# Patient Record
Sex: Female | Born: 1960 | Race: Black or African American | Hispanic: No | Marital: Single | Smoking: Current every day smoker
Health system: Southern US, Community
[De-identification: ages and names within clinical notes are randomized; demographics above are authoritative.]

## PROBLEM LIST (undated history)

## (undated) DIAGNOSIS — H539 Unspecified visual disturbance: Secondary | ICD-10-CM

## (undated) DIAGNOSIS — K5900 Constipation, unspecified: Secondary | ICD-10-CM

## (undated) DIAGNOSIS — I10 Essential (primary) hypertension: Secondary | ICD-10-CM

## (undated) DIAGNOSIS — G259 Extrapyramidal and movement disorder, unspecified: Secondary | ICD-10-CM

## (undated) DIAGNOSIS — G35 Multiple sclerosis: Secondary | ICD-10-CM

## (undated) HISTORY — DX: Unspecified visual disturbance: H53.9

## (undated) HISTORY — PX: COLOSTOMY: SHX63

## (undated) HISTORY — PX: HERNIA REPAIR: SHX51

## (undated) HISTORY — DX: Extrapyramidal and movement disorder, unspecified: G25.9

## (undated) HISTORY — PX: COLOSTOMY REVERSAL: SHX5782

## (undated) HISTORY — PX: KNEE SURGERY: SHX244

---

## 2008-04-26 ENCOUNTER — Emergency Department (HOSPITAL_BASED_OUTPATIENT_CLINIC_OR_DEPARTMENT_OTHER): Admission: EM | Admit: 2008-04-26 | Discharge: 2008-04-26 | Payer: Self-pay | Admitting: Emergency Medicine

## 2008-05-02 ENCOUNTER — Emergency Department (HOSPITAL_BASED_OUTPATIENT_CLINIC_OR_DEPARTMENT_OTHER): Admission: EM | Admit: 2008-05-02 | Discharge: 2008-05-02 | Payer: Self-pay | Admitting: Emergency Medicine

## 2010-03-01 ENCOUNTER — Ambulatory Visit: Payer: Self-pay | Admitting: Interventional Radiology

## 2010-03-01 ENCOUNTER — Emergency Department (HOSPITAL_BASED_OUTPATIENT_CLINIC_OR_DEPARTMENT_OTHER)
Admission: EM | Admit: 2010-03-01 | Discharge: 2010-03-01 | Payer: Self-pay | Source: Home / Self Care | Admitting: Emergency Medicine

## 2010-08-03 DIAGNOSIS — M20019 Mallet finger of unspecified finger(s): Secondary | ICD-10-CM | POA: Insufficient documentation

## 2010-08-06 LAB — PREGNANCY, URINE: Preg Test, Ur: NEGATIVE

## 2010-08-06 LAB — URINALYSIS, ROUTINE W REFLEX MICROSCOPIC
Bilirubin Urine: NEGATIVE
Glucose, UA: NEGATIVE mg/dL
Ketones, ur: NEGATIVE mg/dL
Leukocytes, UA: NEGATIVE
Nitrite: NEGATIVE
Protein, ur: NEGATIVE mg/dL
Specific Gravity, Urine: 1.018 (ref 1.005–1.030)
Urobilinogen, UA: 0.2 mg/dL (ref 0.0–1.0)
pH: 5.5 (ref 5.0–8.0)

## 2010-08-06 LAB — URINE MICROSCOPIC-ADD ON

## 2012-04-01 ENCOUNTER — Encounter (HOSPITAL_BASED_OUTPATIENT_CLINIC_OR_DEPARTMENT_OTHER): Payer: Self-pay | Admitting: Emergency Medicine

## 2012-04-01 ENCOUNTER — Emergency Department (HOSPITAL_BASED_OUTPATIENT_CLINIC_OR_DEPARTMENT_OTHER): Payer: PRIVATE HEALTH INSURANCE

## 2012-04-01 ENCOUNTER — Emergency Department (HOSPITAL_BASED_OUTPATIENT_CLINIC_OR_DEPARTMENT_OTHER)
Admission: EM | Admit: 2012-04-01 | Discharge: 2012-04-01 | Disposition: A | Discharge status: Home / Self Care | Payer: PRIVATE HEALTH INSURANCE | Attending: Emergency Medicine | Admitting: Emergency Medicine

## 2012-04-01 DIAGNOSIS — G35 Multiple sclerosis: Secondary | ICD-10-CM | POA: Insufficient documentation

## 2012-04-01 DIAGNOSIS — Z79899 Other long term (current) drug therapy: Secondary | ICD-10-CM | POA: Insufficient documentation

## 2012-04-01 DIAGNOSIS — R1110 Vomiting, unspecified: Secondary | ICD-10-CM | POA: Insufficient documentation

## 2012-04-01 DIAGNOSIS — K56609 Unspecified intestinal obstruction, unspecified as to partial versus complete obstruction: Secondary | ICD-10-CM | POA: Insufficient documentation

## 2012-04-01 DIAGNOSIS — K5900 Constipation, unspecified: Secondary | ICD-10-CM | POA: Insufficient documentation

## 2012-04-01 DIAGNOSIS — I10 Essential (primary) hypertension: Secondary | ICD-10-CM | POA: Insufficient documentation

## 2012-04-01 DIAGNOSIS — K460 Unspecified abdominal hernia with obstruction, without gangrene: Secondary | ICD-10-CM | POA: Insufficient documentation

## 2012-04-01 HISTORY — DX: Multiple sclerosis: G35

## 2012-04-01 HISTORY — DX: Essential (primary) hypertension: I10

## 2012-04-01 HISTORY — DX: Constipation, unspecified: K59.00

## 2012-04-01 LAB — COMPREHENSIVE METABOLIC PANEL
ALT: 13 U/L (ref 0–35)
AST: 16 U/L (ref 0–37)
Alkaline Phosphatase: 71 U/L (ref 39–117)
BUN: 12 mg/dL (ref 6–23)
CO2: 33 mEq/L — ABNORMAL HIGH (ref 19–32)
Calcium: 10.5 mg/dL (ref 8.4–10.5)
Chloride: 97 mEq/L (ref 96–112)
GFR calc Af Amer: 84 mL/min — ABNORMAL LOW (ref >90)
GFR calc non Af Amer: 73 mL/min — ABNORMAL LOW (ref >90)
Glucose, Bld: 104 mg/dL — ABNORMAL HIGH (ref 70–99)
Potassium: 3.5 mEq/L (ref 3.5–5.1)
Sodium: 140 mEq/L (ref 135–145)
Total Bilirubin: 0.4 mg/dL (ref 0.3–1.2)
Total Protein: 8.7 g/dL — ABNORMAL HIGH (ref 6.0–8.3)

## 2012-04-01 LAB — CBC WITH DIFFERENTIAL/PLATELET
Basophils Absolute: 0 10*3/uL (ref 0.0–0.1)
Basophils Relative: 0 % (ref 0–1)
Eosinophils Absolute: 0.2 10*3/uL (ref 0.0–0.7)
Eosinophils Relative: 1 % (ref 0–5)
Hemoglobin: 14.9 g/dL (ref 12.0–15.0)
Lymphocytes Relative: 9 % — ABNORMAL LOW (ref 12–46)
Lymphs Abs: 1.3 10*3/uL (ref 0.7–4.0)
MCH: 29 pg (ref 26.0–34.0)
MCHC: 34.5 g/dL (ref 30.0–36.0)
MCV: 84.2 fL (ref 78.0–100.0)
Neutro Abs: 13.1 10*3/uL — ABNORMAL HIGH (ref 1.7–7.7)
Platelets: 322 10*3/uL (ref 150–400)
RBC: 5.13 MIL/uL — ABNORMAL HIGH (ref 3.87–5.11)
RDW: 12.8 % (ref 11.5–15.5)
WBC: 15.1 10*3/uL — ABNORMAL HIGH (ref 4.0–10.5)

## 2012-04-01 LAB — URINALYSIS, ROUTINE W REFLEX MICROSCOPIC
Bilirubin Urine: NEGATIVE
Glucose, UA: NEGATIVE mg/dL
Hgb urine dipstick: NEGATIVE
Ketones, ur: NEGATIVE mg/dL
Leukocytes, UA: NEGATIVE
Nitrite: NEGATIVE
Protein, ur: NEGATIVE mg/dL
Specific Gravity, Urine: 1.016 (ref 1.005–1.030)
Urobilinogen, UA: 1 mg/dL (ref 0.0–1.0)

## 2012-04-01 LAB — LIPASE, BLOOD: Lipase: 15 U/L (ref 11–59)

## 2012-04-01 MED ORDER — HYDROMORPHONE HCL PF 1 MG/ML IJ SOLN
1.0000 mg | Freq: Once | INTRAMUSCULAR | Status: AC
  Administered 2012-04-01: 1 mg via INTRAVENOUS
  Filled 2012-04-01: qty 1

## 2012-04-01 MED ORDER — SODIUM CHLORIDE 0.9 % IV BOLUS (SEPSIS)
1000.0000 mL | Freq: Once | INTRAVENOUS | Status: AC
  Administered 2012-04-01: 1000 mL via INTRAVENOUS

## 2012-04-01 MED ORDER — METOCLOPRAMIDE HCL 5 MG/ML IJ SOLN
10.0000 mg | Freq: Once | INTRAMUSCULAR | Status: AC
  Administered 2012-04-01: 10 mg via INTRAVENOUS
  Filled 2012-04-01: qty 2

## 2012-04-01 MED ORDER — IOHEXOL 300 MG/ML  SOLN
100.0000 mL | Freq: Once | INTRAMUSCULAR | Status: AC | PRN
  Administered 2012-04-01: 100 mL via INTRAVENOUS

## 2012-04-01 MED ORDER — IOHEXOL 300 MG/ML  SOLN
25.0000 mL | INTRAMUSCULAR | Status: AC
  Administered 2012-04-01: 25 mL via ORAL

## 2012-04-01 MED ORDER — LIDOCAINE VISCOUS 2 % MT SOLN
20.0000 mL | Freq: Once | OROMUCOSAL | Status: AC
  Administered 2012-04-01: 15 mL via OROMUCOSAL
  Filled 2012-04-01: qty 15

## 2012-04-01 MED ORDER — ONDANSETRON HCL 4 MG/2ML IJ SOLN
4.0000 mg | Freq: Once | INTRAMUSCULAR | Status: AC
  Administered 2012-04-01: 4 mg via INTRAVENOUS
  Filled 2012-04-01: qty 2

## 2012-04-01 NOTE — ED Notes (Signed)
Pt having N/V and abdominal pain for a week.  Pt states she is having BM's regularly but has a lot of difficulty with constipation.

## 2012-04-01 NOTE — ED Provider Notes (Signed)
History   This chart was scribed for Tina B. Bernette Mayers, MD by Sofie Rower, ED Scribe. The patient was seen in room MH01/MH01 and the patient's care was started at 5:57PM.     CSN: [id number]  Arrival date & time 04/01/12  1709   First MD Initiated Contact with Patient 04/01/12 1757      Chief Complaint  Patient presents with  . Abdominal Pain  . Emesis    (Consider location/radiation/quality/duration/timing/severity/associated sxs/prior treatment) Patient is a 51 y.o. female presenting with abdominal pain and vomiting. The history is provided by the patient and a relative. No language interpreter was used.  Abdominal Pain The primary symptoms of the illness include abdominal pain and vomiting.  Emesis  Associated symptoms include abdominal pain.    Tina Griffith is a 51 y.o. female , with a hx of constipation and colostomy reversal, who presents to the Emergency Department complaining of sudden, progressively worsening, abdominal pain, located at the lower abdomen , onset one week ago.  Associated symptoms include vomiting (X 1 today). The pt reports her bowels have been moving normally for the past week, however, she has not experienced a bowel movement as of yet today. In addition, the pt informs her baseline value of bowel movements is one every three days. The pt has a hx of multiple sclerosis and hypertension.   The pt denies fever and difficulty urinating.    The pt does not smoke or drink alcohol.   PCP is Dr. Babs Sciara [facility])   Past Medical History  Diagnosis Date  . Constipation   . MS (multiple sclerosis)   . Hypertension     Past Surgical History  Procedure Date  . Colostomy   . Colostomy reversal   . Knee surgery     No family history on file.  History  Substance Use Topics  . Smoking status: Not on file  . Smokeless tobacco: Not on file  . Alcohol Use:     OB History    Grav Para Term Preterm Abortions TAB SAB  Ect Mult Living                  Review of Systems  Gastrointestinal: Positive for vomiting and abdominal pain.  All other systems reviewed and are negative.    Allergies  Review of patient's allergies indicates no known allergies.  Home Medications   Current Outpatient Rx  Name  Route  Sig  Dispense  Refill  . FUROSEMIDE 40 MG PO TABS   Oral   Take 40 mg by mouth daily.         Marland Kitchen GLATIRAMER ACETATE 20 MG/ML [place] KIT   Subcutaneous   Inject 20 mg into the skin daily.         Marland Kitchen HYDROCHLOROTHIAZIDE 25 MG PO TABS   Oral   Take 25 mg by mouth daily.         Marland Kitchen HYDROCODONE-ACETAMINOPHEN 10-325 MG PO TABS   Oral   Take 1 tablet by mouth every 6 (six) hours as needed.         . AMITIZA PO   Oral   Take by mouth.         . MORPHINE SULFATE 15 MG PO TABS   Oral   Take 15 mg by mouth every 4 (four) hours as needed.         Marland Kitchen POLYETHYLENE GLYCOL 3350 PO PACK   Oral   Take 17 g by  mouth daily.         . TRAMADOL HCL 50 MG PO TABS   Oral   Take 50 mg by mouth every 6 (six) hours as needed.           BP 139/67  Pulse 82  Temp 97.8 F (36.6 C) (Oral)  Resp 22  SpO2 100%  Physical Exam  Nursing note and vitals reviewed. Constitutional: She is oriented to person, place, and time. She appears well-developed and well-nourished.  HENT:  Head: Normocephalic and atraumatic.  Eyes: EOM are normal. Pupils are equal, round, and reactive to light.  Neck: Normal range of motion. Neck supple.  Cardiovascular: Normal rate, normal heart sounds and intact distal pulses.   Pulmonary/Chest: Effort normal and breath sounds normal.  Abdominal: Bowel sounds are normal. She exhibits no distension. There is tenderness.       Large midline hernia scar, lower abdominal tenderness detected.   Musculoskeletal: Normal range of motion. She exhibits no edema and no tenderness.  Neurological: She is alert and oriented to person, place, and time. She has normal strength. No  cranial nerve deficit or sensory deficit.  Skin: Skin is warm and dry. No rash noted.  Psychiatric: She has a normal mood and affect.    ED Course  Procedures (including critical care time)  DIAGNOSTIC STUDIES: Oxygen Saturation is 100% on room air, normal by my interpretation.    COORDINATION OF CARE:  6:06 PM- Treatment plan discussed with patient. Pt agrees with treatment.      Results for orders placed during the hospital encounter of 04/01/12  CBC WITH DIFFERENTIAL      Component Value Range   WBC 15.1 (*) 4.0 - 10.5 K/uL   RBC 5.13 (*) 3.87 - 5.11 MIL/uL   Hemoglobin 14.9  12.0 - 15.0 g/dL   HCT 82.9  56.2 - 13.0 %   MCV 84.2  78.0 - 100.0 fL   MCH 29.0  26.0 - 34.0 pg   MCHC 34.5  30.0 - 36.0 g/dL   RDW 86.5  78.4 - 69.6 %   Platelets 322  150 - 400 K/uL   Neutrophils Relative 86 (*) 43 - 77 %   Neutro Abs 13.1 (*) 1.7 - 7.7 K/uL   Lymphocytes Relative 9 (*) 12 - 46 %   Lymphs Abs 1.3  0.7 - 4.0 K/uL   Monocytes Relative 4  3 - 12 %   Monocytes Absolute 0.5  0.1 - 1.0 K/uL   Eosinophils Relative 1  0 - 5 %   Eosinophils Absolute 0.2  0.0 - 0.7 K/uL   Basophils Relative 0  0 - 1 %   Basophils Absolute 0.0  0.0 - 0.1 K/uL  COMPREHENSIVE METABOLIC PANEL      Component Value Range   Sodium 140  135 - 145 mEq/L   Potassium 3.5  3.5 - 5.1 mEq/L   Chloride 97  96 - 112 mEq/L   CO2 33 (*) 19 - 32 mEq/L   Glucose, Bld 104 (*) 70 - 99 mg/dL   BUN 12  6 - 23 mg/dL   Creatinine, Ser 2.95  0.50 - 1.10 mg/dL   Calcium 28.4  8.4 - 13.2 mg/dL   Total Protein 8.7 (*) 6.0 - 8.3 g/dL   Albumin 4.4  3.5 - 5.2 g/dL   AST 16  0 - 37 U/L   ALT 13  0 - 35 U/L   Alkaline Phosphatase 71  39 - 117 U/L  Total Bilirubin 0.4  0.3 - 1.2 mg/dL   GFR calc non Af Amer 73 (*) >90 mL/min   GFR calc Af Amer 84 (*) >90 mL/min  LIPASE, BLOOD      Component Value Range   Lipase 15  11 - 59 U/L  URINALYSIS, ROUTINE W REFLEX MICROSCOPIC      Component Value Range   Color, Urine YELLOW   YELLOW   APPearance CLEAR  CLEAR   Specific Gravity, Urine 1.016  1.005 - 1.030   pH 7.0  5.0 - 8.0   Glucose, UA NEGATIVE  NEGATIVE mg/dL   Hgb urine dipstick NEGATIVE  NEGATIVE   Bilirubin Urine NEGATIVE  NEGATIVE   Ketones, ur NEGATIVE  NEGATIVE mg/dL   Protein, ur NEGATIVE  NEGATIVE mg/dL   Urobilinogen, UA 1.0  0.0 - 1.0 mg/dL   Nitrite NEGATIVE  NEGATIVE   Leukocytes, UA NEGATIVE  NEGATIVE   Ct Abdomen Pelvis W Contrast  04/01/2012  *RADIOLOGY REPORT*  Clinical Data: Small bowel obstruction, evaluate for incarcerated hernia, history of colostomy with reversal 3 years ago  CT ABDOMEN AND PELVIS WITH CONTRAST  Technique:  Multidetector CT imaging of the abdomen and pelvis was performed following the standard protocol during bolus administration of intravenous contrast.  Contrast: 1 OMNIPAQUE IOHEXOL 300 MG/ML  SOLN, OMNIPAQUE IOHEXOL 300 MG/ML  SOLN  Comparison: None.  Findings:  There are two adjacent midline anterior abdominal wall incisional hernias.  The more cranially located hernia neck measures approximately 4.4 cm in greatest coronal dimension (sagittal image 64, series six) and contains a short segment of nondilated transverse colon.  The neck of the larger more caudally located hernia measures approximately 8.9 cm in greatest sagittal dimension (image 66, series 6) and contains multiple loops of dilated small bowel and results in obstruction of the exiting loop of small bowel (axial image 66, series 2).  Feculent material is seen within the adjacent small bowel contained within the hernia sac (image 71, series 2) compatible with stasis.   The downstream small bowel is decompressed.  There is a small amount of free fluid within the hernia sac (image 71, series 2). There is stranding and mesenteric vascular congestion about the caudal aspect of the neck of the hernia.  No drainable fluid collection.   No pneumoperitoneum, pneumatosis or portal venous gas.  A staple line is seen within  the distal sigmoid colon (axial images 51-56).  Colonic diverticulosis without evidence of diverticulitis.  Normal hepatic contour.  Sub centimeter hypoattenuating lesion within the left lobe of the liver (image 10, series 2) is too small to accurately characterize.  Normal appearance of the gallbladder. No intra or extrahepatic biliary ductal dilatation.  No ascites.  Symmetric enhancement and excretion of the bilateral kidneys.  No discrete renal lesions.  No renal stones.  No urinary obstruction or perinephric stranding.  Normal appearance of the bilateral adrenal glands, pancreas and spleen.  Incidental note is made of a small splenule.  Scattered atherosclerotic calcifications of a normal caliber abdominal aorta.  The major branch vessels of the abdominal aorta are patent.  No retroperitoneal, mesenteric, pelvic or inguinal lymphadenopathy.  Enlarged myomatous uterus one of which appears degenerating and peripherally calcified (image 62, series 2).  There is a minimal amount of fluid within the endometrial canal, presumably physiologic.  No discrete adnexal lesion.  No free fluid in the pelvis.  Limited visualization of the lower thorax demonstrates dependent subpleural ground-glass atelectasis. Note is made of a calcified  granuloma within the right lower lung (image 6).  No focal airspace opacities or pleural effusions.  Normal heart size.  No pericardial effusion.  No acute or aggressive osseous abnormalities.  IMPRESSION:  1.  Two wide neck midline lower anterior abdominal wall incisional hernias.  The more caudal hernia results in a high-grade small bowel obstruction at the level of the exiting loop of small bowel. There is a minimal amount of fluid within the caudal hernia sac but no evidence of pneumoperitoneum or drainable fluid collection.  2.  The more cranially located wide neck incisional hernia contains a short segment of nondilated loop of transverse colon.  3.  Colonic diverticulosis without  evidence of diverticulitis.  4.  Enlarged myomatous uterus.   Original Report Authenticated By: Tacey Ruiz, MD       1. Small bowel obstruction   2. Incarcerated hernia       MDM  CT as above shows SBO from incarcerated bowel. I attempted again to reduce hernia without success. Discussed with General Surgery at [facility] who will accept her in transfer. NGT placed for decompression. Pt understands plan.       I personally performed the services described in this documentation, which was scribed in my presence. The recorded information has been reviewed and is accurate.     Tina B. Bernette Mayers, MD 04/01/12 2336

## 2012-04-01 NOTE — ED Notes (Signed)
Patient transported to CT 

## 2012-04-01 NOTE — ED Notes (Signed)
[facility] called to transport to [facility] ED, RN aware.

## 2012-04-01 NOTE — ED Notes (Signed)
NG attempted to right nare-would not pass-easily passed thru left nare-placement verified with air auscultation and return of gastric contents-placed on low intermittent suction per EDP orders

## 2012-04-01 NOTE — ED Notes (Signed)
[facility] here for transport-NG removed from suction and clamped for transport

## 2012-06-26 DIAGNOSIS — N939 Abnormal uterine and vaginal bleeding, unspecified: Secondary | ICD-10-CM | POA: Insufficient documentation

## 2012-10-28 DIAGNOSIS — K5730 Diverticulosis of large intestine without perforation or abscess without bleeding: Secondary | ICD-10-CM | POA: Insufficient documentation

## 2012-10-28 DIAGNOSIS — G35 Multiple sclerosis: Secondary | ICD-10-CM | POA: Insufficient documentation

## 2012-10-28 DIAGNOSIS — Z79899 Other long term (current) drug therapy: Secondary | ICD-10-CM | POA: Insufficient documentation

## 2012-10-28 DIAGNOSIS — N951 Menopausal and female climacteric states: Secondary | ICD-10-CM | POA: Insufficient documentation

## 2012-10-28 DIAGNOSIS — S83289A Other tear of lateral meniscus, current injury, unspecified knee, initial encounter: Secondary | ICD-10-CM | POA: Insufficient documentation

## 2012-10-28 DIAGNOSIS — M25569 Pain in unspecified knee: Secondary | ICD-10-CM | POA: Insufficient documentation

## 2012-10-28 DIAGNOSIS — B351 Tinea unguium: Secondary | ICD-10-CM | POA: Insufficient documentation

## 2012-11-13 ENCOUNTER — Emergency Department (HOSPITAL_BASED_OUTPATIENT_CLINIC_OR_DEPARTMENT_OTHER)
Admission: EM | Admit: 2012-11-13 | Discharge: 2012-11-13 | Disposition: A | Discharge status: Home / Self Care | Payer: Medicare Other | Attending: Emergency Medicine | Admitting: Emergency Medicine

## 2012-11-13 ENCOUNTER — Encounter (HOSPITAL_BASED_OUTPATIENT_CLINIC_OR_DEPARTMENT_OTHER): Payer: Self-pay

## 2012-11-13 ENCOUNTER — Emergency Department (HOSPITAL_BASED_OUTPATIENT_CLINIC_OR_DEPARTMENT_OTHER): Payer: Medicare Other

## 2012-11-13 DIAGNOSIS — Z8719 Personal history of other diseases of the digestive system: Secondary | ICD-10-CM | POA: Insufficient documentation

## 2012-11-13 DIAGNOSIS — Z79899 Other long term (current) drug therapy: Secondary | ICD-10-CM | POA: Insufficient documentation

## 2012-11-13 DIAGNOSIS — L02419 Cutaneous abscess of limb, unspecified: Secondary | ICD-10-CM | POA: Insufficient documentation

## 2012-11-13 DIAGNOSIS — Z8669 Personal history of other diseases of the nervous system and sense organs: Secondary | ICD-10-CM | POA: Insufficient documentation

## 2012-11-13 DIAGNOSIS — I10 Essential (primary) hypertension: Secondary | ICD-10-CM | POA: Insufficient documentation

## 2012-11-13 DIAGNOSIS — F17200 Nicotine dependence, unspecified, uncomplicated: Secondary | ICD-10-CM | POA: Insufficient documentation

## 2012-11-13 DIAGNOSIS — L03119 Cellulitis of unspecified part of limb: Secondary | ICD-10-CM | POA: Insufficient documentation

## 2012-11-13 DIAGNOSIS — L03116 Cellulitis of left lower limb: Secondary | ICD-10-CM

## 2012-11-13 LAB — BASIC METABOLIC PANEL
CO2: 34 mEq/L — ABNORMAL HIGH (ref 19–32)
Calcium: 10.1 mg/dL (ref 8.4–10.5)
Creatinine, Ser: 1.1 mg/dL (ref 0.50–1.10)
GFR calc non Af Amer: 57 mL/min — ABNORMAL LOW (ref >90)
Glucose, Bld: 84 mg/dL (ref 70–99)

## 2012-11-13 LAB — CBC WITH DIFFERENTIAL/PLATELET
Basophils Absolute: 0 10*3/uL (ref 0.0–0.1)
Eosinophils Absolute: 0.3 10*3/uL (ref 0.0–0.7)
Eosinophils Relative: 3 % (ref 0–5)
HCT: 36.9 % (ref 36.0–46.0)
Lymphocytes Relative: 17 % (ref 12–46)
Lymphs Abs: 1.6 10*3/uL (ref 0.7–4.0)
MCH: 31.5 pg (ref 26.0–34.0)
MCV: 88.7 fL (ref 78.0–100.0)
Monocytes Absolute: 0.8 10*3/uL (ref 0.1–1.0)
Platelets: 284 10*3/uL (ref 150–400)
RDW: 12.4 % (ref 11.5–15.5)
WBC: 9.2 10*3/uL (ref 4.0–10.5)

## 2012-11-13 MED ORDER — OXYCODONE-ACETAMINOPHEN 5-325 MG PO TABS
2.0000 | ORAL_TABLET | Freq: Once | ORAL | Status: AC
  Administered 2012-11-13: 2 via ORAL
  Filled 2012-11-13 (×2): qty 2

## 2012-11-13 MED ORDER — CEPHALEXIN 500 MG PO CAPS: 500.0000 mg | ORAL_CAPSULE | Freq: Four times a day (QID) | ORAL | Status: DC

## 2012-11-13 MED ORDER — DEXTROSE 5 % IV SOLN
1.0000 g | Freq: Once | INTRAVENOUS | Status: AC
  Administered 2012-11-13: 1 g via INTRAVENOUS
  Filled 2012-11-13: qty 10

## 2012-11-13 NOTE — ED Notes (Signed)
Family at bedside. 

## 2012-11-13 NOTE — ED Notes (Signed)
Pt reports left leg and foot swelling that started Friday.  She was seen by PMD for a check-up.  Foot and lower leg is warm to touch, swollen and painful.

## 2012-11-13 NOTE — ED Provider Notes (Signed)
History     CSN: [id number]  Arrival date & time 11/13/12  1245   First MD Initiated Contact with Patient 11/13/12 1256      Chief Complaint  Patient presents with  . Leg Swelling    (Consider location/radiation/quality/duration/timing/severity/associated sxs/prior treatment) HPI  Patient with swelling left foot for 2 days.  Seen by pmd 2 days pte and states it was not swollen then but was red. She states her doctor was going to check the blood flow op but after she called today and told her doctor about the swelling she was told to come to the ed. She has noted pain in the left calf that is sharp and aching.  There is some redness but she has not noted rapid increase and denies injury.  She denies chest pain, dyspnea, fever, immobilization, history of dvt or pe.    Past Medical History  Diagnosis Date  . Constipation   . MS (multiple sclerosis)   . Hypertension     Past Surgical History  Procedure Laterality Date  . Colostomy    . Colostomy reversal    . Knee surgery    . Hernia repair      No family history on file.  History  Substance Use Topics  . Smoking status: Current Every Day Smoker -- 1.00 packs/day    Types: Cigarettes  . Smokeless tobacco: Not on file  . Alcohol Use: No    OB History   Grav Para Term Preterm Abortions TAB SAB Ect Mult Living                  Review of Systems  All other systems reviewed and are negative.    Allergies  Review of patient's allergies indicates no known allergies.  Home Medications   Current Outpatient Rx  Name  Route  Sig  Dispense  Refill  . furosemide (LASIX) 40 MG tablet   Oral   Take 40 mg by mouth daily.         Marland Kitchen glatiramer (COPAXONE) 20 MG/ML injection   Subcutaneous   Inject 20 mg into the skin daily.         . hydrochlorothiazide (HYDRODIURIL) 25 MG tablet   Oral   Take 25 mg by mouth daily.         Marland Kitchen HYDROcodone-acetaminophen (NORCO) 10-325 MG per tablet   Oral   Take 1 tablet by  mouth every 6 (six) hours as needed.         . Lubiprostone (AMITIZA PO)   Oral   Take by mouth.         . morphine (MSIR) 15 MG tablet   Oral   Take 15 mg by mouth every 4 (four) hours as needed.         . polyethylene glycol (MIRALAX / GLYCOLAX) packet   Oral   Take 17 g by mouth daily.         . traMADol (ULTRAM) 50 MG tablet   Oral   Take 50 mg by mouth every 6 (six) hours as needed.           BP 133/72  Pulse 83  Temp(Src) 97.9 F (36.6 C) (Oral)  Resp 18  Ht 5\' 6"  (1.676 m)  Wt 174 lb (78.926 kg)  BMI 28.1 kg/m2  SpO2 100%  Physical Exam  Nursing note and vitals reviewed. Constitutional: She is oriented to person, place, and time. She appears well-developed and well-nourished.  HENT:  Head: Normocephalic  and atraumatic.  Right Ear: External ear normal.  Left Ear: External ear normal.  Nose: Nose normal.  Mouth/Throat: Oropharynx is clear and moist.  Eyes: Conjunctivae and EOM are normal. Pupils are equal, round, and reactive to light.  Neck: Normal range of motion. Neck supple.  Cardiovascular: Normal rate, regular rhythm and normal heart sounds.   Pulmonary/Chest: Effort normal and breath sounds normal.  Abdominal: Soft. Bowel sounds are normal.  Musculoskeletal: Normal range of motion. She exhibits edema.  See skin, dp intact bilaterally equally  Neurological: She is alert and oriented to person, place, and time. She has normal reflexes.  Skin:  Left calf ttp, pedal edema left foot greater than right foot with edema anterior tibia left, with erythema and some mild warmth  Psychiatric: She has a normal mood and affect. Her behavior is normal. Judgment and thought content normal.    ED Course  Procedures (including critical care time)  Labs Reviewed  CBC WITH DIFFERENTIAL  BASIC METABOLIC PANEL   US Venous Img Lower Unilateral Left  11/13/2012   *RADIOLOGY REPORT*  Clinical Data: Left leg pain and swelling  LEFT LOWER EXTREMITY VENOUS DUPLEX  ULTRASOUND  Technique:  Gray-scale sonography with graded compression, as well as color Doppler and duplex ultrasound were performed to evaluate the deep venous system of the lower extremity from the level of the common femoral vein through the popliteal and proximal calf veins. Spectral Doppler was utilized to evaluate flow at rest and with distal augmentation maneuvers.  Comparison:  None.  Findings:  Normal compressibility of the common femoral, superficial femoral, and popliteal veins is demonstrated, as well as the visualized proximal calf veins.  No filling defects to suggest DVT on grayscale or color Doppler imaging.  Doppler waveforms show normal direction of venous flow, normal respiratory phasicity and response to augmentation.  IMPRESSION: No evidence of lower extremity deep vein thrombosis.   Original Report Authenticated By: Judie Petit. Miles Costain, M.D.     No diagnosis found.    MDM     DDX- presentation concerning for dvt but no evidence of clot on [place], patient with probable cellulitis but not diabetic, wbc normal.  IV rocephin given here.  Good pulses and no evidence of ischemia.      Hilario Quarry, MD 11/14/12 [id number]

## 2012-11-29 DIAGNOSIS — R609 Edema, unspecified: Secondary | ICD-10-CM | POA: Insufficient documentation

## 2013-08-22 DIAGNOSIS — I10 Essential (primary) hypertension: Secondary | ICD-10-CM | POA: Insufficient documentation

## 2013-08-22 DIAGNOSIS — E039 Hypothyroidism, unspecified: Secondary | ICD-10-CM | POA: Insufficient documentation

## 2013-08-22 DIAGNOSIS — E7800 Pure hypercholesterolemia, unspecified: Secondary | ICD-10-CM | POA: Insufficient documentation

## 2013-08-22 DIAGNOSIS — F17200 Nicotine dependence, unspecified, uncomplicated: Secondary | ICD-10-CM | POA: Insufficient documentation

## 2013-08-22 DIAGNOSIS — Z720 Tobacco use: Secondary | ICD-10-CM | POA: Insufficient documentation

## 2013-08-22 DIAGNOSIS — D509 Iron deficiency anemia, unspecified: Secondary | ICD-10-CM | POA: Insufficient documentation

## 2013-08-22 DIAGNOSIS — J309 Allergic rhinitis, unspecified: Secondary | ICD-10-CM | POA: Insufficient documentation

## 2013-08-22 DIAGNOSIS — R32 Unspecified urinary incontinence: Secondary | ICD-10-CM | POA: Insufficient documentation

## 2013-12-28 DIAGNOSIS — R946 Abnormal results of thyroid function studies: Secondary | ICD-10-CM | POA: Insufficient documentation

## 2014-02-28 DIAGNOSIS — G8110 Spastic hemiplegia affecting unspecified side: Secondary | ICD-10-CM | POA: Insufficient documentation

## 2014-02-28 DIAGNOSIS — N950 Postmenopausal bleeding: Secondary | ICD-10-CM | POA: Insufficient documentation

## 2014-02-28 DIAGNOSIS — M2240 Chondromalacia patellae, unspecified knee: Secondary | ICD-10-CM | POA: Insufficient documentation

## 2014-02-28 DIAGNOSIS — R260 Ataxic gait: Secondary | ICD-10-CM | POA: Insufficient documentation

## 2014-02-28 DIAGNOSIS — H469 Unspecified optic neuritis: Secondary | ICD-10-CM | POA: Insufficient documentation

## 2014-02-28 DIAGNOSIS — M79609 Pain in unspecified limb: Secondary | ICD-10-CM | POA: Insufficient documentation

## 2014-02-28 DIAGNOSIS — L8990 Pressure ulcer of unspecified site, unspecified stage: Secondary | ICD-10-CM | POA: Insufficient documentation

## 2014-02-28 DIAGNOSIS — M24573 Contracture, unspecified ankle: Secondary | ICD-10-CM | POA: Insufficient documentation

## 2014-02-28 DIAGNOSIS — R200 Anesthesia of skin: Secondary | ICD-10-CM | POA: Insufficient documentation

## 2014-02-28 DIAGNOSIS — M179 Osteoarthritis of knee, unspecified: Secondary | ICD-10-CM | POA: Insufficient documentation

## 2014-02-28 DIAGNOSIS — M24576 Contracture, unspecified foot: Secondary | ICD-10-CM

## 2014-02-28 DIAGNOSIS — K430 Incisional hernia with obstruction, without gangrene: Secondary | ICD-10-CM | POA: Insufficient documentation

## 2014-02-28 DIAGNOSIS — D219 Benign neoplasm of connective and other soft tissue, unspecified: Secondary | ICD-10-CM | POA: Insufficient documentation

## 2014-02-28 DIAGNOSIS — R531 Weakness: Secondary | ICD-10-CM | POA: Insufficient documentation

## 2014-02-28 DIAGNOSIS — M1710 Unilateral primary osteoarthritis, unspecified knee: Secondary | ICD-10-CM | POA: Insufficient documentation

## 2014-02-28 DIAGNOSIS — L84 Corns and callosities: Secondary | ICD-10-CM | POA: Insufficient documentation

## 2014-02-28 DIAGNOSIS — D573 Sickle-cell trait: Secondary | ICD-10-CM | POA: Insufficient documentation

## 2014-02-28 DIAGNOSIS — M6281 Muscle weakness (generalized): Secondary | ICD-10-CM | POA: Insufficient documentation

## 2014-02-28 DIAGNOSIS — L97909 Non-pressure chronic ulcer of unspecified part of unspecified lower leg with unspecified severity: Secondary | ICD-10-CM | POA: Insufficient documentation

## 2014-02-28 DIAGNOSIS — N938 Other specified abnormal uterine and vaginal bleeding: Secondary | ICD-10-CM | POA: Insufficient documentation

## 2014-02-28 DIAGNOSIS — M25569 Pain in unspecified knee: Secondary | ICD-10-CM | POA: Insufficient documentation

## 2014-04-22 DIAGNOSIS — E876 Hypokalemia: Secondary | ICD-10-CM | POA: Insufficient documentation

## 2014-05-28 ENCOUNTER — Telehealth: Payer: Self-pay | Admitting: *Deleted

## 2014-05-28 NOTE — Telephone Encounter (Signed)
Patient calling in to get medication refilled, patient was informed that Dr [name] is not in the office yet, patient spoke with Dr Garth Bigness nurse Faith.

## 2014-05-31 ENCOUNTER — Telehealth: Payer: Self-pay | Admitting: Neurology

## 2014-05-31 NOTE — Telephone Encounter (Signed)
I called the patient. She got a Rx for hydrocodone, it was taken to the pharmacy and the pharmacy needed authorization to fill it. I called the pharmacy, and the medication was written by Dr. Domenic Schwab. The pharmacy has contacted their office.

## 2014-07-25 ENCOUNTER — Encounter: Payer: Self-pay | Admitting: Neurology

## 2014-07-25 ENCOUNTER — Ambulatory Visit (INDEPENDENT_AMBULATORY_CARE_PROVIDER_SITE_OTHER): Payer: Medicare Other | Admitting: Neurology

## 2014-07-25 VITALS — BP 118/70 | HR 90 | Resp 14 | Ht 64.0 in | Wt 148.6 lb

## 2014-07-25 DIAGNOSIS — R350 Frequency of micturition: Secondary | ICD-10-CM | POA: Diagnosis not present

## 2014-07-25 DIAGNOSIS — K5909 Other constipation: Secondary | ICD-10-CM

## 2014-07-25 DIAGNOSIS — G35 Multiple sclerosis: Secondary | ICD-10-CM

## 2014-07-25 DIAGNOSIS — K5900 Constipation, unspecified: Secondary | ICD-10-CM | POA: Insufficient documentation

## 2014-07-25 DIAGNOSIS — M79605 Pain in left leg: Secondary | ICD-10-CM | POA: Diagnosis not present

## 2014-07-25 DIAGNOSIS — R261 Paralytic gait: Secondary | ICD-10-CM | POA: Insufficient documentation

## 2014-07-25 MED ORDER — MORPHINE SULFATE 30 MG PO TABS: 30.0000 mg | ORAL_TABLET | Freq: Three times a day (TID) | ORAL | Status: DC

## 2014-07-25 MED ORDER — POLYETHYLENE GLYCOL 3350 17 G PO PACK: 17.0000 g | PACK | Freq: Every day | ORAL | Status: DC

## 2014-07-25 MED ORDER — HYDROCODONE-ACETAMINOPHEN 10-325 MG PO TABS: 1.0000 | ORAL_TABLET | ORAL | Status: DC | PRN

## 2014-07-25 NOTE — Progress Notes (Signed)
[facility]  PATIENT: Tina Griffith DOB: 06-21-1960  REFERRING DOCTOR OR PCP:  None currently SOURCE: patient  _________________________________   HISTORICAL  CHIEF COMPLAINT:  Chief Complaint  Patient presents with  . Multiple Sclerosis    Sts. she tolerates Copaxone well.  Denies new or worsening sx.  She needs r/f of Hydrocodone and Morphine./fim    HISTORY OF PRESENT ILLNESS:  Tina Griffith is a 54 year old woman with multiple sclerosis diagnosed in July 2005. She presented with left leg spasticity and weakness and an MRI was consistent with multiple sclerosis. Initially, she was placed on Betaseron but had some exacerbations with worsening leg function. She then switched to Copaxone. She has been tolerating Copaxone pretty well and recently switched to the 40 mg every other day dose. She denies any recent exacerbation.    MRI of the brain 05/09/2013 showed prominent white matter changes in the right cerebellum and the white matter of both hemispheres in a pattern and configuration consistent with multiple sclerosis. There were no enhancing lesions. When the study was compared to a prior MRI dated 01/11/2011, there was no new or progressive findings.  Gait/strength/sensation/pain: Her main problem has always been the left leg where she has a lot of spasticity and weakness. Because of that her gait is spastic and wide. She uses a walker. Easily, she has had spasticity in the right arm. She notes decreased function of the right hand. Recently she had her tizanidine increase to 8 mg 3 times a day. She noted only a mild benefit with the higher dose. She is on morphine IR 30 mg 3 times a day and hydrocodone 10/325 up to 6 times a day. With these medicines she notes benefit with less pain. She tolerates the medicine fairly well with the exception constipation. She takes Linzess plus MiraLAX with a lot of benefit for the constipation.  Bladder/bowel: She notes that she  has more urinary frequency and recently the odor has been different.   She has had UTI's in past and is concerned about another one.  . She denies any significant hesitancy and feels that she usually empties her bladder well. She has had some urinary tract infections. She has had constipation that is likely more due to the pain medicines than to the MS. The constipation improved with Linzess plus MiraLAX.  Vision: She denies any MS related vision problems. She wears glasses with benefit.  Fatigue/sleep: She feels that her energy level is pretty good most days. She wakes up very energized and has no difficulty doing activities that she sets out to do. She sleeps well.  Mood/cognition: She denies depression. Her anxiety is doing much better. She no longer needs medicines frequently. She denies any significant cognitive problems and notes no problems with short-term memory. She feels her thinking skills are unchanged where there were a few years ago.   REVIEW OF SYSTEMS: Constitutional: No fevers, chills, sweats, or change in appetite Eyes: No visual changes, double vision, eye pain Ear, nose and throat: No hearing loss, ear pain, nasal congestion, sore throat Cardiovascular: No chest pain, palpitations Respiratory: No shortness of breath at rest or with exertion.   No wheezes GastrointestinaI: Notes constipation.   No pain Genitourinary: Notes urinary frequency. Musculoskeletal: No neck pain, some back pain, lots of left > right leg pain Integumentary: No rash, pruritus, skin lesions Neurological: as above Psychiatric: No depression at this time.  No anxiety Endocrine: No palpitations, diaphoresis, change in appetite, change in weigh or increased  thirst Hematologic/Lymphatic: No anemia, purpura, petechiae. Allergic/Immunologic: No itchy/runny eyes, nasal congestion, recent allergic reactions, rashes  ALLERGIES: No Known Allergies  HOME MEDICATIONS:  Current outpatient prescriptions:    .  furosemide (LASIX) 40 MG tablet, Take 40 mg by mouth daily., Disp: , Rfl:  .  glatiramer (COPAXONE) 20 MG/ML injection, Inject 20 mg into the skin daily., Disp: , Rfl:  .  hydrochlorothiazide (HYDRODIURIL) 25 MG tablet, Take 25 mg by mouth daily., Disp: , Rfl:  .  HYDROcodone-acetaminophen (NORCO) 10-325 MG per tablet, Take 1 tablet by mouth every 6 (six) hours as needed., Disp: , Rfl:  .  Lubiprostone (AMITIZA PO), Take by mouth., Disp: , Rfl:  .  morphine (MSIR) 15 MG tablet, Take 15 mg by mouth every 4 (four) hours as needed., Disp: , Rfl:  .  polyethylene glycol (MIRALAX / GLYCOLAX) packet, Take 17 g by mouth daily., Disp: , Rfl:  .  traMADol (ULTRAM) 50 MG tablet, Take 50 mg by mouth every 6 (six) hours as needed., Disp: , Rfl:  .  cephALEXin (KEFLEX) 500 MG capsule, Take 1 capsule (500 mg total) by mouth 4 (four) times daily. (Patient not taking: Reported on 07/25/2014), Disp: 40 capsule, Rfl: 0  PAST MEDICAL HISTORY: Past Medical History  Diagnosis Date  . Constipation   . MS (multiple sclerosis)   . Hypertension   . Movement disorder   . Vision abnormalities     PAST SURGICAL HISTORY: Past Surgical History  Procedure Laterality Date  . Colostomy    . Colostomy reversal    . Knee surgery    . Hernia repair      FAMILY HISTORY: Family History  Problem Relation Age of Onset  . Lung cancer Mother   . Stroke Mother   . Diabetes Father     SOCIAL HISTORY:  History   Social History  . Marital Status: Single    Spouse Name: N/A  . Number of Children: N/A  . Years of Education: N/A   Occupational History  . Not on file.   Social History Main Topics  . Smoking status: Current Every Day Smoker -- 1.00 packs/day    Types: Cigarettes  . Smokeless tobacco: Not on file  . Alcohol Use: No  . Drug Use: No  . Sexual Activity: Not on file   Other Topics Concern  . Not on file   Social History Narrative     PHYSICAL EXAM  Filed Vitals:   07/25/14 0951  BP:  118/70  Pulse: 90  Resp: 14  Height: 5\' 4"  (1.626 m)  Weight: 148 lb 9.6 oz (67.405 kg)    Body mass index is 25.49 kg/(m^2).   General: The patient is well-developed and well-nourished and in no acute distress  Eyes:  Funduscopic exam shows normal optic discs and retinal vessels.   Bilat arcus senilis  Neck: The neck is supple, no carotid bruits are noted.  The neck is nontender.  Cardiovascular: The heart has a regular rate and rhythm with a normal S1 and S2. There were no murmurs, gallops or rubs. Lungs are clear to auscultation.  Skin: Extremities are without significant edema.  Musculoskeletal:  Back is nontender  Neurologic Exam  Mental status: The patient is alert and oriented x 3 at the time of the examination. The patient has apparent normal recent and remote memory, with an apparently normal attention span and concentration ability.   Speech is normal.  Cranial nerves: Extraocular movements are full. Pupils are equal,  round, and reactive to light and accomodation.  Visual fields are full.  Facial symmetry is present. There is good facial sensation to soft touch bilaterally.Facial strength is normal.  Trapezius and sternocleidomastoid strength is normal. No dysarthria is noted.  The tongue is midline, and the patient has symmetric elevation of the soft palate. No obvious hearing deficits are noted.  Motor:  Muscle bulk is normal.   Tone is increased in legs, left worse than right. Strength is  4+/5 in right arm and leg, 4-/5 in left leg and 5/5 in left arm   Sensory: Sensory testing is intact to pinprick, soft touch and vibration sensation in all 4 extremities.  Coordination: She has an intention tremor in arms and legs, worse in right arm and both legs.  Cerebellar testing reveals reduced right finger-nose-finger and reduced right and very poor left heel-to-shin .   Right RAM in arm is slowed  Gait and station: Station is normal.   Gait is spastic and with left foot drop.  She cannot tandem walk. Romberg is negative.   Reflexes: Deep tendon reflexes are increased in legs.   Plantar responses are flexor.    DIAGNOSTIC DATA (LABS, IMAGING, TESTING) - I reviewed patient records, labs, notes, testing and imaging myself where available.  Records from [facility] neurology were reviewed   ASSESSMENT AND PLAN  Multiple sclerosis - Plan: Urinalysis with Reflex Microscopic, Culture, Urine  Spastic gait  Leg pain, left  Urinary frequency - Plan: Urinalysis with Reflex Microscopic, Culture, Urine  Other constipation   In summary, Tina Griffith is a 54 year old woman with multiple sclerosis who has had had difficulty with her gait due to left greater than right spasticity and pain.  She is doing fairly well on her current medical regimen and I will renew her opiate prescriptions. She might have a urinary tract infection and I'll check a UA and culture and start antibiotic base of the results. She is advised to continue her bowel regimen for her constipation as it has been more successful lately.  She will return to see me in 4 months or sooner if she has new or worsening neurologic symptoms.  50 minute face-to-face evaluation with greater than 50% of the visit counseling and coordinating care about her multiple sclerosis and related symptoms.   Richard A. [name], MD, PhD 06/27/8248, 03:70 AM Certified in Neurology, Clinical Neurophysiology, Sleep Medicine, Pain Medicine and Neuroimaging  [facility] [address] [place], [place] [address] [phone]

## 2014-07-26 LAB — URINALYSIS, ROUTINE W REFLEX MICROSCOPIC
Bilirubin, UA: NEGATIVE
Glucose, UA: NEGATIVE
Ketones, UA: NEGATIVE
Leukocytes, UA: NEGATIVE
Nitrite, UA: NEGATIVE
PROTEIN UA: NEGATIVE
RBC, UA: NEGATIVE
Specific Gravity, UA: 1.018 (ref 1.005–1.030)
Urobilinogen, Ur: 0.2 mg/dL (ref 0.2–1.0)
pH, UA: 5.5 (ref 5.0–7.5)

## 2014-07-26 LAB — URINE CULTURE: ORGANISM ID, BACTERIA: NO GROWTH

## 2014-07-29 ENCOUNTER — Telehealth: Payer: Self-pay | Admitting: *Deleted

## 2014-07-29 NOTE — Telephone Encounter (Signed)
-----   Message from [place]. [name], MD sent at 07/26/2014 12:57 PM EST ----- UA is ok does not need any antibiotics

## 2014-07-29 NOTE — Telephone Encounter (Signed)
Spoke with [place] and per RAS, advised u/a did not show uti--she does not need antibiotics. Marchella verbalized understanding of same/fim

## 2014-08-16 ENCOUNTER — Other Ambulatory Visit: Payer: Self-pay | Admitting: Neurology

## 2014-08-16 ENCOUNTER — Telehealth: Payer: Self-pay | Admitting: Neurology

## 2014-08-16 NOTE — Telephone Encounter (Signed)
Pt is calling requesting a written Rx for traMADol (ULTRAM) 50 MG tablet. Please call when ready for pick up.

## 2014-08-16 NOTE — Telephone Encounter (Signed)
Patient was seen on 03/03.  At that time Morphine and Hydrocodone were prescribed.  I called the pharmacy to inquire about Tramadol Rx.  Was told it was last filled from Dr [name] in November (directions two bid prn), and since that time patient has gotten a refill from Dr Sable Feil and Dr Corene Cornea.  Dr [name] is out of the office.  Forwarding request to [facility] for review.  Please advise.  Thank you.

## 2014-08-16 NOTE — Telephone Encounter (Signed)
Since patient has gotten Tramadol refills from several other doctors since seeing Dr. [name], I would prefer Dr. [name] to approve any more refills from this office if he feels this is clinically warranted. Thank you.

## 2014-08-16 NOTE — Telephone Encounter (Signed)
Spoke with [place] and explained RAS is ooo today--Dr. Lavell Anchors will not rx. Tramadol since she got it from other Dr.'s--(even tho they are [facility] docs, rx'ing it after RAS left that office).  Will check with RAS about this Monday./fim

## 2014-08-19 ENCOUNTER — Encounter: Payer: Self-pay | Admitting: *Deleted

## 2014-08-19 ENCOUNTER — Telehealth: Payer: Self-pay | Admitting: Neurology

## 2014-08-19 ENCOUNTER — Other Ambulatory Visit: Payer: Self-pay | Admitting: *Deleted

## 2014-08-19 ENCOUNTER — Encounter: Payer: Self-pay | Admitting: Neurology

## 2014-08-19 MED ORDER — DALFAMPRIDINE ER 10 MG PO TB12: 10.0000 mg | ORAL_TABLET | Freq: Two times a day (BID) | ORAL | Status: DC

## 2014-08-19 MED ORDER — GLATIRAMER ACETATE 40 MG/ML ~~LOC~~ SOSY: 40.0000 mg | PREFILLED_SYRINGE | SUBCUTANEOUS | Status: DC

## 2014-08-19 MED ORDER — TRAMADOL HCL 50 MG PO TABS: ORAL_TABLET | ORAL | Status: DC

## 2014-08-19 NOTE — Telephone Encounter (Signed)
Spoke with Annette Stable and advised Tramadol rx. is up front at [facility] for her to pick up/fim

## 2014-08-19 NOTE — Telephone Encounter (Signed)
Patient is calling for refills for the following:  Copaxone 40 mg and Ampyra 10 mg. She also needs a written Rx Tramadol 50 mg.   The patient would also like to have a letter stating she can take water aerobics.  Please call.

## 2014-08-19 NOTE — Telephone Encounter (Signed)
Duplicate task/fim

## 2014-08-19 NOTE — Telephone Encounter (Signed)
Spoke with [place].  Ampyra and Copaxone escribed to [facility].  Tramadol printed, signed, up front for her to pick up.  Letter also up front for her to pick up with Tramadol rx/fim

## 2014-08-29 ENCOUNTER — Telehealth: Payer: Self-pay

## 2014-08-29 MED ORDER — HYDROCODONE-ACETAMINOPHEN 10-325 MG PO TABS: 1.0000 | ORAL_TABLET | ORAL | Status: DC | PRN

## 2014-08-29 MED ORDER — MORPHINE SULFATE 30 MG PO TABS: 30.0000 mg | ORAL_TABLET | Freq: Three times a day (TID) | ORAL | Status: DC

## 2014-08-29 NOTE — Telephone Encounter (Signed)
Needs RX refill that she wants to pick up tomorrow Hydro 10mg  325 Morph 30mg 

## 2014-08-29 NOTE — Telephone Encounter (Signed)
Rx's for Hydrocodone and Morphine are due.  Rx's printed, signed by RAS and up front [facility] for Senai to pick up/fim

## 2014-09-16 ENCOUNTER — Other Ambulatory Visit: Payer: Self-pay | Admitting: Neurology

## 2014-09-18 ENCOUNTER — Telehealth: Payer: Self-pay | Admitting: Neurology

## 2014-09-18 MED ORDER — TRAMADOL HCL 50 MG PO TABS: ORAL_TABLET | ORAL | Status: DC

## 2014-09-18 NOTE — Telephone Encounter (Signed)
LMOM that rx. will be ready by 2pm today.  Rx. is due today.  Rx. printed, signed, up front [facility]/fim

## 2014-09-18 NOTE — Telephone Encounter (Signed)
Pt is calling to request a written Rx for traMADol (ULTRAM) 50 MG tablet. Please call when ready for pick up.

## 2014-09-27 ENCOUNTER — Telehealth: Payer: Self-pay

## 2014-09-27 NOTE — Telephone Encounter (Signed)
Patient calling for a refill.  HYDROcodone-acetaminophen (NORCO) 10-325 MG per tablet

## 2014-09-30 ENCOUNTER — Other Ambulatory Visit: Payer: Self-pay | Admitting: *Deleted

## 2014-09-30 MED ORDER — HYDROCODONE-ACETAMINOPHEN 10-325 MG PO TABS: 1.0000 | ORAL_TABLET | ORAL | Status: DC | PRN

## 2014-09-30 MED ORDER — MORPHINE SULFATE 30 MG PO TABS: 30.0000 mg | ORAL_TABLET | Freq: Three times a day (TID) | ORAL | Status: DC

## 2014-09-30 NOTE — Telephone Encounter (Signed)
Rx. printed, signed, given directly to pt/fim

## 2014-10-09 DIAGNOSIS — N189 Chronic kidney disease, unspecified: Secondary | ICD-10-CM | POA: Insufficient documentation

## 2014-10-09 DIAGNOSIS — D72829 Elevated white blood cell count, unspecified: Secondary | ICD-10-CM | POA: Insufficient documentation

## 2014-10-09 DIAGNOSIS — R8290 Unspecified abnormal findings in urine: Secondary | ICD-10-CM | POA: Insufficient documentation

## 2014-10-16 ENCOUNTER — Encounter (HOSPITAL_BASED_OUTPATIENT_CLINIC_OR_DEPARTMENT_OTHER): Payer: Self-pay

## 2014-10-16 ENCOUNTER — Emergency Department (HOSPITAL_BASED_OUTPATIENT_CLINIC_OR_DEPARTMENT_OTHER)
Admission: EM | Admit: 2014-10-16 | Discharge: 2014-10-16 | Disposition: A | Discharge status: Home / Self Care | Payer: Medicare Other | Attending: Emergency Medicine | Admitting: Emergency Medicine

## 2014-10-16 ENCOUNTER — Emergency Department (HOSPITAL_BASED_OUTPATIENT_CLINIC_OR_DEPARTMENT_OTHER): Payer: Medicare Other

## 2014-10-16 DIAGNOSIS — Z792 Long term (current) use of antibiotics: Secondary | ICD-10-CM | POA: Insufficient documentation

## 2014-10-16 DIAGNOSIS — I10 Essential (primary) hypertension: Secondary | ICD-10-CM | POA: Diagnosis not present

## 2014-10-16 DIAGNOSIS — Z720 Tobacco use: Secondary | ICD-10-CM | POA: Diagnosis not present

## 2014-10-16 DIAGNOSIS — K5900 Constipation, unspecified: Secondary | ICD-10-CM | POA: Insufficient documentation

## 2014-10-16 DIAGNOSIS — Z79899 Other long term (current) drug therapy: Secondary | ICD-10-CM | POA: Diagnosis not present

## 2014-10-16 DIAGNOSIS — M545 Low back pain, unspecified: Secondary | ICD-10-CM

## 2014-10-16 DIAGNOSIS — G35 Multiple sclerosis: Secondary | ICD-10-CM | POA: Insufficient documentation

## 2014-10-16 DIAGNOSIS — M549 Dorsalgia, unspecified: Secondary | ICD-10-CM | POA: Diagnosis present

## 2014-10-16 LAB — CBC WITH DIFFERENTIAL/PLATELET
BASOS ABS: 0 10*3/uL (ref 0.0–0.1)
Basophils Relative: 0 % (ref 0–1)
EOS PCT: 2 % (ref 0–5)
Eosinophils Absolute: 0.3 10*3/uL (ref 0.0–0.7)
HCT: 38.1 % (ref 36.0–46.0)
HEMOGLOBIN: 13.1 g/dL (ref 12.0–15.0)
LYMPHS ABS: 2.6 10*3/uL (ref 0.7–4.0)
Lymphocytes Relative: 22 % (ref 12–46)
MCH: 29.3 pg (ref 26.0–34.0)
MCHC: 34.4 g/dL (ref 30.0–36.0)
MCV: 85.2 fL (ref 78.0–100.0)
MONOS PCT: 6 % (ref 3–12)
Monocytes Absolute: 0.7 10*3/uL (ref 0.1–1.0)
NEUTROS PCT: 70 % (ref 43–77)
Neutro Abs: 8.4 10*3/uL — ABNORMAL HIGH (ref 1.7–7.7)
PLATELETS: 354 10*3/uL (ref 150–400)
RBC: 4.47 MIL/uL (ref 3.87–5.11)
RDW: 12.5 % (ref 11.5–15.5)
WBC: 12 10*3/uL — ABNORMAL HIGH (ref 4.0–10.5)

## 2014-10-16 LAB — BASIC METABOLIC PANEL
Anion gap: 8 (ref 5–15)
BUN: 13 mg/dL (ref 6–20)
CHLORIDE: 102 mmol/L (ref 101–111)
CO2: 27 mmol/L (ref 22–32)
Calcium: 9.8 mg/dL (ref 8.9–10.3)
Creatinine, Ser: 1.1 mg/dL — ABNORMAL HIGH (ref 0.44–1.00)
GFR, EST NON AFRICAN AMERICAN: 56 mL/min — AB (ref >60)
Glucose, Bld: 83 mg/dL (ref 65–99)
POTASSIUM: 4 mmol/L (ref 3.5–5.1)
SODIUM: 137 mmol/L (ref 135–145)

## 2014-10-16 LAB — URINALYSIS, ROUTINE W REFLEX MICROSCOPIC
Bilirubin Urine: NEGATIVE
Glucose, UA: NEGATIVE mg/dL
HGB URINE DIPSTICK: NEGATIVE
Ketones, ur: NEGATIVE mg/dL
Leukocytes, UA: NEGATIVE
NITRITE: NEGATIVE
Protein, ur: NEGATIVE mg/dL
Specific Gravity, Urine: 1.017 (ref 1.005–1.030)
UROBILINOGEN UA: 0.2 mg/dL (ref 0.0–1.0)
pH: 6 (ref 5.0–8.0)

## 2014-10-16 MED ORDER — HYDROCODONE-ACETAMINOPHEN 5-325 MG PO TABS: 1.0000 | ORAL_TABLET | Freq: Four times a day (QID) | ORAL | Status: DC | PRN

## 2014-10-16 NOTE — Discharge Instructions (Signed)
This cause of your lower abdominal and back pain was not identified today.  Your white blood cell count was slightly elevated.  Get rechecked immediately if you develop fevers, new numbness/weakness, or new concerning symptoms.   Back Pain, Adult Low back pain is very common. About 1 in 5 people have back pain.The cause of low back pain is rarely dangerous. The pain often gets better over time.About half of people with a sudden onset of back pain feel better in just 2 weeks. About 8 in 10 people feel better by 6 weeks.  CAUSES Some common causes of back pain include:  Strain of the muscles or ligaments supporting the spine.  Wear and tear (degeneration) of the spinal discs.  Arthritis.  Direct injury to the back. DIAGNOSIS Most of the time, the direct cause of low back pain is not known.However, back pain can be treated effectively even when the exact cause of the pain is unknown.Answering your caregiver's questions about your overall health and symptoms is one of the most accurate ways to make sure the cause of your pain is not dangerous. If your caregiver needs more information, he or she may order lab work or imaging tests (X-rays or MRIs).However, even if imaging tests show changes in your back, this usually does not require surgery. HOME CARE INSTRUCTIONS For many people, back pain returns.Since low back pain is rarely dangerous, it is often a condition that people can learn to [facility] their own.   Remain active. It is stressful on the back to sit or stand in one place. Do not sit, drive, or stand in one place for more than 30 minutes at a time. Take short walks on level surfaces as soon as pain allows.Try to increase the length of time you walk each day.  Do not stay in bed.Resting more than 1 or 2 days can delay your recovery.  Do not avoid exercise or work.Your body is made to move.It is not dangerous to be active, even though your back may hurt.Your back will likely heal  faster if you return to being active before your pain is gone.  Pay attention to your body when you bend and lift. Many people have less discomfortwhen lifting if they bend their knees, keep the load close to their bodies,and avoid twisting. Often, the most comfortable positions are those that put less stress on your recovering back.  Find a comfortable position to sleep. Use a firm mattress and lie on your side with your knees slightly bent. If you lie on your back, put a pillow under your knees.  Only take over-the-counter or prescription medicines as directed by your caregiver. Over-the-counter medicines to reduce pain and inflammation are often the most helpful.Your caregiver may prescribe muscle relaxant drugs.These medicines help dull your pain so you can more quickly return to your normal activities and healthy exercise.  Put ice on the injured area.  Put ice in a plastic bag.  Place a towel between your skin and the bag.  Leave the ice on for 15-20 minutes, 03-04 times a day for the first 2 to 3 days. After that, ice and heat may be alternated to reduce pain and spasms.  Ask your caregiver about trying back exercises and gentle massage. This may be of some benefit.  Avoid feeling anxious or stressed.Stress increases muscle tension and can worsen back pain.It is important to recognize when you are anxious or stressed and learn ways to manage it.Exercise is a great option. [place]  CARE IF:  You have pain that is not relieved with rest or medicine.  You have pain that does not improve in 1 week.  You have new symptoms.  You are generally not feeling well. SEEK IMMEDIATE MEDICAL CARE IF:   You have pain that radiates from your back into your legs.  You develop new bowel or bladder control problems.  You have unusual weakness or numbness in your arms or legs.  You develop nausea or vomiting.  You develop abdominal pain.  You feel faint. Document Released:  05/10/2005 Document Revised: 11/09/2011 Document Reviewed: 09/11/2013 [facility] Patient Information 2015 [place], [place]. This information is not intended to replace advice given to you by your health care provider. Make sure you discuss any questions you have with your health care provider.

## 2014-10-16 NOTE — ED Notes (Signed)
C/o lower back pain x 3 days-strong smelling/dark urine x 3 weeks

## 2014-10-16 NOTE — ED Provider Notes (Signed)
CSN: [id number]     Arrival date & time 10/16/14  1208 History   First MD Initiated Contact with Patient 10/16/14 1229     Chief Complaint  Patient presents with  . Back Pain      Patient is a 55 y.o. female presenting with back pain. The history is provided by the patient. No language interpreter was used.  Back Pain  Tina Griffith presents for evaluation of lower abdominal/low back pain.  Sxs have been ongoing for the last 3-4 days and described like a hurting and she thinks she has a UTI.  Pain is constant and worse with urination. She has associated dark (tea colored) urine and foul smelling urine for the last 1-2 weeks.  She denies any fevers, vomiting, discharge, diarrhea, change in appetite or oral intake.  She has a hx/o MS and takes copaxone three times weekly and uses a walker for ambulation.  She has frequent urinary incontinence at baseline.  No new injuries.  Sxs are moderate, constant, worsening.  No new weakness or numbness.  Past Medical History  Diagnosis Date  . Constipation   . MS (multiple sclerosis)   . Hypertension   . Movement disorder   . Vision abnormalities    Past Surgical History  Procedure Laterality Date  . Colostomy    . Colostomy reversal    . Knee surgery    . Hernia repair     Family History  Problem Relation Age of Onset  . Lung cancer Mother   . Stroke Mother   . Diabetes Father    History  Substance Use Topics  . Smoking status: Current Every Day Smoker -- 1.00 packs/day    Types: Cigarettes  . Smokeless tobacco: Not on file  . Alcohol Use: No   OB History    No data available     Review of Systems  Musculoskeletal: Positive for back pain.  All other systems reviewed and are negative.     Allergies  Review of patient's allergies indicates no known allergies.  Home Medications   Prior to Admission medications   Medication Sig Start Date End Date Taking? Authorizing Provider  cephALEXin (KEFLEX) 500 MG capsule Take 1 capsule  (500 mg total) by mouth 4 (four) times daily. Patient not taking: Reported on 07/25/2014 11/13/12   Pattricia Boss, MD  COPAXONE 40 MG/ML SOSY Inject subcutaneously 40mg   three times weekly 08/16/14   Britt Bottom, MD  dalfampridine 10 MG TB12 Take 1 tablet (10 mg total) by mouth 2 (two) times daily. 08/19/14   Britt Bottom, MD  furosemide (LASIX) 40 MG tablet Take 40 mg by mouth daily.    Historical Provider, MD  Glatiramer Acetate 40 MG/ML SOSY Inject 40 mg into the skin 3 (three) times a week. Inject 40mg  (one syringe) SQ 3 times weekly 08/19/14   Britt Bottom, MD  hydrochlorothiazide (HYDRODIURIL) 25 MG tablet Take 25 mg by mouth daily.    Historical Provider, MD  HYDROcodone-acetaminophen (NORCO) 10-325 MG per tablet Take 1 tablet by mouth every 4 (four) hours as needed. 09/30/14   Britt Bottom, MD  Lubiprostone (AMITIZA PO) Take by mouth.    Historical Provider, MD  morphine (MSIR) 30 MG tablet Take 1 tablet (30 mg total) by mouth 3 (three) times daily. 09/30/14   Britt Bottom, MD  oxybutynin (DITROPAN) 5 MG tablet TAKE 2 TABLETS 3 TIMES A DAY. IF THIS CAUSES DRY MOUTH, DECREASE BACKTO ONE TABLET 3 TIMES A  DAY 09/20/14   Britt Bottom, MD  polyethylene glycol (MIRALAX / GLYCOLAX) packet Take 17 g by mouth daily. 07/25/14   Britt Bottom, MD  traMADol (ULTRAM) 50 MG tablet Take 2 tablets 3 times daily as needed for pain. 09/18/14   Britt Bottom, MD   BP 145/76 mmHg  Pulse 85  Temp(Src) 98.1 F (36.7 C) (Oral)  Resp 18  Ht 5\' 6"  (1.676 m)  Wt 153 lb (69.4 kg)  BMI 24.71 kg/m2  SpO2 100% Physical Exam  Constitutional: She is oriented to person, place, and time. She appears well-developed and well-nourished.  HENT:  Head: Normocephalic and atraumatic.  Cardiovascular: Normal rate and regular rhythm.   No murmur heard. Pulmonary/Chest: Effort normal and breath sounds normal. No respiratory distress.  Abdominal: Soft. There is no tenderness. There is no rebound and no guarding.   Musculoskeletal: She exhibits no edema or tenderness.  Neurological: She is alert and oriented to person, place, and time.  Skin: Skin is warm and dry.  Psychiatric: She has a normal mood and affect. Her behavior is normal.  Nursing note and vitals reviewed.   ED Course  Procedures (including critical care time) Labs Review Labs Reviewed  BASIC METABOLIC PANEL - Abnormal; Notable for the following:    Creatinine, Ser 1.10 (*)    GFR calc non Af Amer 56 (*)    All other components within normal limits  CBC WITH DIFFERENTIAL/PLATELET - Abnormal; Notable for the following:    WBC 12.0 (*)    Neutro Abs 8.4 (*)    All other components within normal limits  URINE CULTURE  URINALYSIS, ROUTINE W REFLEX MICROSCOPIC    Imaging Review Ct Renal Stone Study  10/16/2014   CLINICAL DATA:  Bilateral flank and lower abdominal pain today.  EXAM: CT ABDOMEN AND PELVIS WITHOUT CONTRAST  TECHNIQUE: Multidetector CT imaging of the abdomen and pelvis was performed following the standard protocol without IV contrast.  COMPARISON:  CT abdomen and pelvis 04/01/2012 and 01/20/2008.  FINDINGS: Dependent atelectasis is seen the left lung base. Several punctate nodular opacities are identified in the posterior right lower lobe likely due to some prior infectious or inflammatory process. The largest measures 0.3 cm on image 8 and is unchanged. No pleural or pericardial effusion is identified.  No renal or ureteral stones are seen on the right or left. There is no hydronephrosis. The liver, gallbladder, spleen, adrenal glands, pancreas and biliary tree appear normal. Aortoiliac atherosclerosis without aneurysm is identified.  The patient has a fibroid uterus. The uterus appears smaller most consistent with degeneration of fibroids seen on the prior examination. Since the prior study, the patient has undergone repair of a ventral hernia with mesh in place. No recurrent hernia or complicating feature is identified.  Surgical anastomosis in the sigmoid colon is again seen. There are a few diverticula but no diverticulitis. The stomach and small bowel are unremarkable. No lymphadenopathy or fluid is identified.  No focal bony abnormality is seen.  IMPRESSION: Negative for urinary tract stone. No acute finding abdomen or pelvis.  Status post ventral hernia repair since the most recent examination without evidence of complicating feature.  Diverticulosis without diverticulitis.  Fibroid uterus.  Punctate nodules in the right lower lobe most consistent with old infectious or inflammatory process center are seen on prior exams.   Electronically Signed   By: Inge Rise M.D.   On: 10/16/2014 14:51     EKG Interpretation None  MDM   Final diagnoses:  Midline low back pain without sciatica    Patient here for evaluation of urinary frequency, urinary discomfort, low back pain, foul-smelling urine. Urinalysis is not consistent with urinary tract infection. CT stone study obtained to evaluate for evidence of stone, CT with no obstructing stones, no cause for pain. History of presentation is not consistent with appendicitis, epidural abscess, discitis. Discussed with patient and care for dysuria, back pain as well as PCP follow-up and return precautions.    Quintella Reichert, MD 10/16/14 1515

## 2014-10-17 LAB — URINE CULTURE
Colony Count: NO GROWTH
Culture: NO GROWTH

## 2014-10-30 ENCOUNTER — Other Ambulatory Visit: Payer: Self-pay | Admitting: Neurology

## 2014-10-30 MED ORDER — MORPHINE SULFATE 30 MG PO TABS: 30.0000 mg | ORAL_TABLET | Freq: Three times a day (TID) | ORAL | Status: DC

## 2014-10-30 MED ORDER — HYDROCODONE-ACETAMINOPHEN 10-325 MG PO TABS: 1.0000 | ORAL_TABLET | ORAL | Status: DC | PRN

## 2014-10-30 NOTE — Telephone Encounter (Signed)
Requests entered, forwarded to provider for approval

## 2014-10-30 NOTE — Telephone Encounter (Signed)
Patient called requesting refill for HYDROcodone-acetaminophen (NORCO/VICODIN) 5-325 MG per tablet and morphine (MSIR) 30 MG tablet. Patient advised RX will be ready with in 24 hours unless informed otherwise from nurse. Patient also requesting a referral for water therapy. States she lost the script he gave her on last office visit. Patient can be reached at [phone].

## 2014-10-31 ENCOUNTER — Telehealth: Payer: Self-pay | Admitting: Neurology

## 2014-10-31 NOTE — Telephone Encounter (Signed)
Patient needs a Rx for water aerobics. It would be best if this could be mailed to her home address. Her best number is  [phone]

## 2014-10-31 NOTE — Telephone Encounter (Signed)
[place].  We do not have rx. pads in this office, so I am unable to give a rx. for water aerobics.  I can give her a letter saying it is ok to participate in water aerobics, and want to know if this will suffice./fim

## 2014-11-01 ENCOUNTER — Encounter: Payer: Self-pay | Admitting: *Deleted

## 2014-11-01 NOTE — Telephone Encounter (Signed)
I have spoken with Tina Griffith this afternoon.  She sts. she simply needs a letter stating it is ok for her to participate in water areobics.  I will prepare this for her and mail it to her home address per her request/fim

## 2014-11-04 NOTE — Telephone Encounter (Signed)
Letter was signed by Dr. [name] today and I have mailed it/fim

## 2014-11-28 ENCOUNTER — Other Ambulatory Visit: Payer: Self-pay | Admitting: Neurology

## 2014-11-28 ENCOUNTER — Ambulatory Visit: Payer: Medicare Other | Admitting: Neurology

## 2014-11-28 ENCOUNTER — Encounter: Payer: Self-pay | Admitting: *Deleted

## 2014-11-28 MED ORDER — MORPHINE SULFATE 30 MG PO TABS: 30.0000 mg | ORAL_TABLET | Freq: Three times a day (TID) | ORAL | Status: DC

## 2014-11-28 MED ORDER — HYDROCODONE-ACETAMINOPHEN 10-325 MG PO TABS: 1.0000 | ORAL_TABLET | ORAL | Status: DC | PRN

## 2014-11-28 NOTE — Progress Notes (Unsigned)
Hydrocodone and MSIR rx's up front [facility]/fim

## 2014-11-28 NOTE — Telephone Encounter (Signed)
Patient called requesting a refill on Rx. HYDROcodone-acetaminophen (NORCO) 10-325 MG per tablet and Rx morphine (MSIR) 30 MG tablet. Informed the patient they would be ready within 24 hours unless she hears otherwise from the nurse.

## 2014-11-28 NOTE — Progress Notes (Unsigned)
Subjective:    Patient ID: Tina Griffith is a 54 y.o. female.  HPI {Common ambulatory SmartLinks:[id number]}  Review of Systems  Objective:  Neurologic Exam  Physical Exam  Assessment:   ***  Plan:   ***

## 2014-11-28 NOTE — Telephone Encounter (Signed)
Request entered, forwarded to provider for approval.  

## 2014-11-28 NOTE — Progress Notes (Unsigned)
Hydrocodone and Morphine rx's up front [facility]/fim

## 2014-12-04 ENCOUNTER — Ambulatory Visit (INDEPENDENT_AMBULATORY_CARE_PROVIDER_SITE_OTHER): Payer: Medicare Other | Admitting: Neurology

## 2014-12-04 ENCOUNTER — Encounter: Payer: Self-pay | Admitting: Neurology

## 2014-12-04 VITALS — BP 118/78 | HR 70 | Resp 18 | Ht 66.0 in | Wt 161.0 lb

## 2014-12-04 DIAGNOSIS — M79605 Pain in left leg: Secondary | ICD-10-CM

## 2014-12-04 DIAGNOSIS — G8110 Spastic hemiplegia affecting unspecified side: Secondary | ICD-10-CM | POA: Diagnosis not present

## 2014-12-04 DIAGNOSIS — K5909 Other constipation: Secondary | ICD-10-CM | POA: Diagnosis not present

## 2014-12-04 DIAGNOSIS — G35 Multiple sclerosis: Secondary | ICD-10-CM

## 2014-12-04 DIAGNOSIS — R261 Paralytic gait: Secondary | ICD-10-CM

## 2014-12-04 DIAGNOSIS — R350 Frequency of micturition: Secondary | ICD-10-CM | POA: Diagnosis not present

## 2014-12-04 DIAGNOSIS — R200 Anesthesia of skin: Secondary | ICD-10-CM | POA: Diagnosis not present

## 2014-12-04 MED ORDER — MORPHINE SULFATE 30 MG PO TABS: 30.0000 mg | ORAL_TABLET | Freq: Three times a day (TID) | ORAL | Status: DC

## 2014-12-04 MED ORDER — SULFAMETHOXAZOLE-TRIMETHOPRIM 800-160 MG PO TABS: 1.0000 | ORAL_TABLET | Freq: Two times a day (BID) | ORAL | Status: DC

## 2014-12-04 MED ORDER — HYDROCODONE-ACETAMINOPHEN 10-325 MG PO TABS: 1.0000 | ORAL_TABLET | Freq: Four times a day (QID) | ORAL | Status: DC | PRN

## 2014-12-04 MED ORDER — TRAMADOL HCL 50 MG PO TABS: ORAL_TABLET | ORAL | Status: DC

## 2014-12-04 NOTE — Progress Notes (Signed)
[facility]  PATIENT: Tina Griffith DOB: 03/12/1961  REFERRING DOCTOR OR PCP:  None currently SOURCE: patient  _________________________________   HISTORICAL  CHIEF COMPLAINT:  Chief Complaint  Patient presents with  . Multiple Sclerosis    Sts. she continues to tolerate Copaxone well.  Sts. her walking is some worse, but this is normal for her in hotter weather.  She also c/o foul odor to urine.  Denies dysuria.  Would like to be checked for uti today/fim    HISTORY OF PRESENT ILLNESS:  Tina Griffith is a 54 year old woman with multiple sclerosis diagnosed in July 2005. She presented with left leg spasticity and weakness and an MRI was consistent with multiple sclerosis. Initially, she was placed on Betaseron but had some exacerbations with worsening leg function. She then switched to Copaxone. She has been tolerating Copaxone pretty well and recently switched to the 40 mg every other day dose. She denies any recent exacerbation.    MRI of the brain 05/09/2013 showed prominent white matter changes in the right cerebellum and the white matter of both hemispheres in a pattern and configuration consistent with multiple sclerosis. There were no enhancing lesions. When the study was compared to a prior MRI dated 01/11/2011, there was no new or progressive findings.  Gait/strength/sensation/pain: Her main problem is left leg weakness and spasticity.     This is always worse on summer due to heat. Gait is spastic and wide. She uses a walker outside the house. She has had spasticity and decreased function in the left arm.  Tizanidine 8 mg 3 times a day helps a little bit. She noted only a mild benefit with the higher dose. She is on morphine IR 30 mg 3 times a day and hydrocodone 10/325 up to 3-4 times a day. With these medicines she notes benefit with less pain. She tolerates the medicine fairly well with the exception constipation. She takes Linzess plus MiraLAX with a lot  of benefit for the constipation.   She takes Ampyra and notes an improvement in her gait.   She tolerates it well.  Bladder/bowel: She has notes urinary frequency, helped by oxybutynon.   She has hrequent UTI's and is concerned about another one due to odor of urine.  . She denies any significant hesitancy and feels that she usually empties her bladder well. She has had some urinary tract infections. She has had constipation that is likely more due to the pain medicines than to the MS. Constipation improved a lo with Linzess plus MiraLAX.  Vision: She denies any MS related vision problems. She wears glasses with benefit.  Fatigue/sleep: She feels that her energy level is pretty good most days. She wakes up very energized and has no difficulty doing activities that she sets out to do. She sleeps well.  Mood/cognition: She denies depression. Her anxiety is doing much better. She no longer needs medicines (was on buspirone). She denies any significant cognitive problems and notes no problems with short-term memory. She feels her thinking skills are unchanged where there were a few years ago.   REVIEW OF SYSTEMS: Constitutional: No fevers, chills, sweats, or change in appetite.   Notes fatigue Eyes: No visual changes, double vision, eye pain Ear, nose and throat: No hearing loss, ear pain, nasal congestion, sore throat Cardiovascular: No chest pain, palpitations Respiratory: No shortness of breath at rest or with exertion.   No wheezes GastrointestinaI: Notes constipation.   No pain Genitourinary: Notes urinary frequency. Musculoskeletal: No neck  pain, some back pain, lots of left > right leg pain Integumentary: No rash, pruritus, skin lesions Neurological: as above Psychiatric: No depression at this time.  No anxiety Endocrine: No palpitations, diaphoresis, change in appetite, change in weigh or increased thirst Hematologic/Lymphatic: No anemia, purpura, petechiae. Allergic/Immunologic: No  itchy/runny eyes, nasal congestion, recent allergic reactions, rashes  ALLERGIES: Allergies  Allergen Reactions  . No Known Allergies     HOME MEDICATIONS:  Current outpatient prescriptions:  .  COPAXONE 40 MG/ML SOSY, Inject subcutaneously 40mg   three times weekly, Disp: 12 Syringe, Rfl: 11 .  dalfampridine 10 MG TB12, Take 1 tablet (10 mg total) by mouth 2 (two) times daily., Disp: 60 tablet, Rfl: 11 .  ENSURE (ENSURE), Frequency:   Dosage:0     Instructions:  Note:One can by mouth twice a day, Disp: , Rfl:  .  fluticasone (FLONASE) 50 MCG/ACT nasal spray, ONE TO TWO SPRAYS EACH NOSTRIL EVERY MORNING AS NEEDED, Disp: , Rfl:  .  furosemide (LASIX) 40 MG tablet, Take 40 mg by mouth daily., Disp: , Rfl:  .  gabapentin (NEURONTIN) 100 MG capsule, Take 1 tablet by mouth for 2 nights, then take 1 tablet twice daily for 2 days, then 1 tablet 3 times daily, Disp: , Rfl:  .  Glatiramer Acetate 40 MG/ML SOSY, Inject 40 mg into the skin 3 (three) times a week. Inject 40mg  (one syringe) SQ 3 times weekly, Disp: 12 Syringe, Rfl: 11 .  hydrochlorothiazide (HYDRODIURIL) 25 MG tablet, Take 25 mg by mouth daily., Disp: , Rfl:  .  HYDROcodone-acetaminophen (NORCO) 10-325 MG per tablet, Take 1 tablet by mouth every 4 (four) hours as needed., Disp: 180 tablet, Rfl: 0 .  HYDROmorphone HCl (EXALGO) 16 MG T24A, Take 16 mg by mouth., Disp: , Rfl:  .  Linaclotide (LINZESS) 145 MCG CAPS capsule, Take by mouth., Disp: , Rfl:  .  Lubiprostone (AMITIZA PO), Take by mouth., Disp: , Rfl:  .  Magnesium Oxide 250 MG TABS, TAKE 2 TABS BY MOUTH DAILY WITH BREAKFAST, Disp: , Rfl:  .  meloxicam (MOBIC) 15 MG tablet, TAKE 1 TABLET DAILY WITH FOOD, Disp: , Rfl:  .  morphine (MSIR) 30 MG tablet, Take 1 tablet (30 mg total) by mouth 3 (three) times daily., Disp: 90 tablet, Rfl: 0 .  nicotine (RA NICOTINE) 14 mg/24hr patch, Place onto the skin., Disp: , Rfl:  .  omeprazole (PRILOSEC) 20 MG capsule, TAKE 1 CAPSULE DAILY 30  MINUTES BEFORE BREAKFAST, Disp: , Rfl:  .  oxybutynin (DITROPAN) 5 MG tablet, TAKE 2 TABLETS 3 TIMES A DAY. IF THIS CAUSES DRY MOUTH, DECREASE BACKTO ONE TABLET 3 TIMES A DAY, Disp: 180 tablet, Rfl: 11 .  polyethylene glycol (MIRALAX / GLYCOLAX) packet, Take 17 g by mouth daily., Disp: 30 each, Rfl: 5 .  potassium chloride (K-DUR) 10 MEQ tablet, Take by mouth., Disp: , Rfl:  .  tiZANidine (ZANAFLEX) 4 MG tablet, 2 tablets po TID, Disp: , Rfl:  .  traMADol (ULTRAM) 50 MG tablet, Take 2 tablets 3 times daily as needed for pain., Disp: 180 tablet, Rfl: 3  PAST MEDICAL HISTORY: Past Medical History  Diagnosis Date  . Constipation   . MS (multiple sclerosis)   . Hypertension   . Movement disorder   . Vision abnormalities     PAST SURGICAL HISTORY: Past Surgical History  Procedure Laterality Date  . Colostomy    . Colostomy reversal    . Knee surgery    .  Hernia repair      FAMILY HISTORY: Family History  Problem Relation Age of Onset  . Lung cancer Mother   . Stroke Mother   . Diabetes Father     SOCIAL HISTORY:  History   Social History  . Marital Status: Single    Spouse Name: N/A  . Number of Children: N/A  . Years of Education: N/A   Occupational History  . Not on file.   Social History Main Topics  . Smoking status: Current Every Day Smoker -- 1.00 packs/day    Types: Cigarettes  . Smokeless tobacco: Not on file  . Alcohol Use: No  . Drug Use: No  . Sexual Activity: Not on file   Other Topics Concern  . Not on file   Social History Narrative     PHYSICAL EXAM  Filed Vitals:   12/04/14 1104  BP: 118/78  Pulse: 70  Resp: 18  Height: 5\' 6"  (1.676 m)  Weight: 161 lb (73.029 kg)    Body mass index is 26 kg/(m^2).   General: The patient is well-developed and well-nourished and in no acute distress  Musculoskeletal:  Back is nontender  Neurologic Exam  Mental status: The patient is alert and oriented x 3 at the time of the examination. The  patient has apparent normal recent and remote memory, with an apparently normal attention span and concentration ability.   Speech is normal.  Cranial nerves: Extraocular movements are full.    Facial symmetry is present. There is good facial sensation to soft touch bilaterally.Facial strength is normal.  Trapezius and sternocleidomastoid strength is normal. No dysarthria is noted.  The tongue is midline, and the patient has symmetric elevation of the soft palate. No obvious hearing deficits are noted.  Motor:  Muscle bulk is normal.   Tone is increased in legs, left worse than right. Strength is  4+/5 in right arm and leg, 4-/5 in left leg and 5/5 in left arm   Sensory: Sensory testing is intact to pinprick, soft touch and vibration sensation in all 4 extremities.  Coordination: She has an intention tremor in arms and legs, worse in right arm and both legs.  Cerebellar testing reveals reduced right finger-nose-finger and reduced right and very poor left heel-to-shin .   Right RAM in arm is slowed  Gait and station: Station is normal.   Gait is spastic and with left foot drop. She cannot tandem walk. Romberg is negative.   Reflexes: Deep tendon reflexes are increased in legs.   Marland Kitchen    DIAGNOSTIC DATA (LABS, IMAGING, TESTING) - I reviewed patient records, labs, notes, testing and imaging myself where available.  Records from [facility] neurology were reviewed   ASSESSMENT AND PLAN  Multiple sclerosis  Hemiplegia, spastic  Spastic gait  Leg pain, left  Loss of feeling or sensation  1.   Continue medications. Refills were provided. 2.    Continue Copaxone for past. 3.    Bactrim DS for possible urinary tract infection. I will check a urinalysis and urine culture. 4.   She will return to see me in 4 months or sooner if she has new or worsening neurologic symptoms.  Richard A. [name], MD, PhD 3/53/2992, 42:68 AM Certified in Neurology, Clinical Neurophysiology, Sleep Medicine, Pain  Medicine and Neuroimaging  [facility] [address], [place], [place] [address] [phone]

## 2014-12-23 ENCOUNTER — Telehealth: Payer: Self-pay | Admitting: Neurology

## 2014-12-23 NOTE — Telephone Encounter (Signed)
Spoke w/ Anderson Malta and told her ICD-10 codes were:  MS (G35) and Gait Disturbance (R26.1). She verbalized understanding.

## 2014-12-23 NOTE — Telephone Encounter (Signed)
MS  (G35) Gait disturbance   (R26.1)

## 2014-12-23 NOTE — Telephone Encounter (Signed)
Anderson Malta with [employer] is calling to get the ICD-10 codes for patient's knee ankle foot orthosis. Please call [phone].

## 2014-12-25 ENCOUNTER — Encounter: Payer: Self-pay | Admitting: *Deleted

## 2014-12-25 NOTE — Progress Notes (Signed)
Faxed signed order from [facility] for left knee ankle foot orthosis. Faxed to [phone], attn to [place]. Received fax confirmation.

## 2014-12-30 ENCOUNTER — Telehealth: Payer: Self-pay | Admitting: Neurology

## 2014-12-30 NOTE — Telephone Encounter (Signed)
Patient called regarding HYDROcodone-acetaminophen ([place]) 10-325 MG per tablet, states she was given 120 and she is ususally given 180. She would like for you to check with Dr. [name] to see if she can get the remaining 34.

## 2014-12-31 NOTE — Telephone Encounter (Signed)
duplicate task/fim 

## 2014-12-31 NOTE — Telephone Encounter (Signed)
Patient called inquiring if she was going to be able get the remainder of the hydrocodone. She can be reached at [phone].

## 2015-01-01 ENCOUNTER — Telehealth: Payer: Self-pay | Admitting: *Deleted

## 2015-01-01 MED ORDER — HYDROCODONE-ACETAMINOPHEN 10-325 MG PO TABS: 1.0000 | ORAL_TABLET | Freq: Four times a day (QID) | ORAL | Status: DC | PRN

## 2015-01-01 MED ORDER — HYDROCODONE-ACETAMINOPHEN 10-325 MG PO TABS
1.0000 | ORAL_TABLET | Freq: Four times a day (QID) | ORAL | Status: DC | PRN
Start: 2015-01-01 — End: 2015-02-27

## 2015-01-01 NOTE — Addendum Note (Signed)
Addended by: France Ravens I on: 01/01/2015 03:42 PM   Modules accepted: Orders

## 2015-01-01 NOTE — Telephone Encounter (Signed)
Pt called back regarding HYDROcodone-acetaminophen (NORCO) 10-325 MG per tablet, insurance will only pay if Rx is re-written for 180 vs writing Rx for addtl 60

## 2015-01-01 NOTE — Telephone Encounter (Signed)
Rx. for #180 reprinted.  1st rx. did not print/fim

## 2015-01-01 NOTE — Addendum Note (Signed)
Addended by: France Ravens I on: 01/01/2015 03:21 PM   Modules accepted: Orders

## 2015-01-01 NOTE — Telephone Encounter (Signed)
Rx. printed, signed, up front [facility]/fim 

## 2015-01-01 NOTE — Telephone Encounter (Signed)
I have spoken with Shante this afternoon.  She sts. pharmacist told her ins. will not cover additional #60.  Rx. for #60 destroyed, witnessed by Rossie Muskrat, RN, and new rx. for #180, postdated for 01-18-15 written/fim

## 2015-01-01 NOTE — Telephone Encounter (Signed)
I checked trhe database and we usually do #180 so we can write another 60 (or if not yet filledshe can bring back and we can rewrite as 180)

## 2015-01-07 ENCOUNTER — Telehealth: Payer: Self-pay | Admitting: Neurology

## 2015-01-07 NOTE — Telephone Encounter (Signed)
Amanda/[facility] in [facility] calling regarding Ultram prescription. Pt is looking for tramadol and is saying it was on hard copy of 12/04/14 Rx but what the pharmacy has is the hard copy with the bottom part torn off. Please call Estill Bamberg at [place]. Pt is on the phone with Estill Bamberg, yelling at her.

## 2015-01-07 NOTE — Telephone Encounter (Signed)
I have spoken with Estill Bamberg at [place] who sts. they may have accidentally discarded Tina Griffith's Tramadol rx. that was given on 12-04-14.  I have provided verbal rx. to pharmacist Nicki Reaper.  He will check the [place]  registry to make sure rx. has not been filled somewhere else first/fim

## 2015-01-29 ENCOUNTER — Other Ambulatory Visit: Payer: Self-pay | Admitting: Neurology

## 2015-01-29 MED ORDER — MORPHINE SULFATE 30 MG PO TABS: 30.0000 mg | ORAL_TABLET | Freq: Three times a day (TID) | ORAL | Status: DC

## 2015-01-29 NOTE — Telephone Encounter (Signed)
Request entered, forwarded to provider for approval.  

## 2015-01-29 NOTE — Telephone Encounter (Signed)
Patient called requesting refill for morphine (MSIR) 30 MG tablet . Patient advised RX will be ready within 24 hours unless otherwise informed by RN.

## 2015-02-13 ENCOUNTER — Telehealth: Payer: Self-pay | Admitting: Neurology

## 2015-02-13 DIAGNOSIS — G8110 Spastic hemiplegia affecting unspecified side: Secondary | ICD-10-CM

## 2015-02-13 DIAGNOSIS — R261 Paralytic gait: Secondary | ICD-10-CM

## 2015-02-13 DIAGNOSIS — G35 Multiple sclerosis: Secondary | ICD-10-CM

## 2015-02-13 NOTE — Telephone Encounter (Signed)
Patient called stating PCP is requesting letter faxed to [facility] in [facility] stating she needs a light weight wheelchair. The wheelchair she has is old,big and bulky. Please call and advise. Patient can be reached at [phone].

## 2015-02-13 NOTE — Telephone Encounter (Signed)
I have spoken with Tina Griffith this afternoon--per her request, order for w/c mailed to her home address/fim

## 2015-02-17 NOTE — Telephone Encounter (Signed)
Patient called wanting to know that when you order the wheelchair, how long does it take to get it. Patient also wants to know if Tina Griffith is going with Dr. [name] to [place] on 02/25/15. Please call patient [phone].

## 2015-02-18 NOTE — Telephone Encounter (Signed)
I have spoken with Tina Griffith this morning and advised that order for w/c was mailed on 02-13-15--she should be getting it this week--she will call me back if she doesn't get it. I have also advised I am not sure yet if I will be at the 02-25-15 MS talk at the [facility]./fim

## 2015-02-26 ENCOUNTER — Telehealth: Payer: Self-pay | Admitting: Neurology

## 2015-02-26 NOTE — Telephone Encounter (Signed)
Pt called requesting refill for morphine (MSIR) 30 MG tablet . Pt advised RX will be ready within 24 hours unless informed otherwise by RN

## 2015-02-27 ENCOUNTER — Other Ambulatory Visit: Payer: Self-pay

## 2015-02-27 MED ORDER — MORPHINE SULFATE 30 MG PO TABS: 30.0000 mg | ORAL_TABLET | Freq: Three times a day (TID) | ORAL | Status: DC

## 2015-02-27 MED ORDER — HYDROCODONE-ACETAMINOPHEN 10-325 MG PO TABS: 1.0000 | ORAL_TABLET | Freq: Four times a day (QID) | ORAL | Status: DC | PRN

## 2015-02-27 NOTE — Telephone Encounter (Signed)
I was out of the office yesterday.  Request has now been sent to the provider for review.

## 2015-02-27 NOTE — Telephone Encounter (Signed)
See previous 09-27-14 telephone note.

## 2015-03-03 ENCOUNTER — Telehealth: Payer: Self-pay | Admitting: Neurology

## 2015-03-03 NOTE — Telephone Encounter (Signed)
Patient called regarding HYDROcodone-acetaminophen (NORCO) 10-325 MG tablet. Pharmacy advised her that this medication was filled for a 45 day supply vs 30 day. Patient states she has never gotten a 45 day supply. She's always gotten a 30 day supply. Pharmacy advised patient medication cannot be filled before 03/19/15. Please call patient [phone].

## 2015-03-04 ENCOUNTER — Telehealth: Payer: Self-pay | Admitting: Neurology

## 2015-03-04 NOTE — Telephone Encounter (Signed)
I have spoken with Tina Griffith.  [facility] [address] [facility] got [facility] 45 day supply of Hydrocodone last month, so can't get current rx. filled right now.  She also requests handicap placard application.  I have completed application and once RAS returns to the office and signs it, I will mail it./fim

## 2015-03-04 NOTE — Telephone Encounter (Signed)
Patient called to request Handicapped placard renewal and also following up on call from yesterday regarding HYDROcodone-acetaminophen ([place]) 10-325 MG tablet states she never heard anything back about it.

## 2015-03-10 NOTE — Telephone Encounter (Signed)
Pt called stating she needs new RX for medication as she never got a 45 day supply of Hydrocodone last month. Please call and advise. Patient can be reached at [phone]

## 2015-03-10 NOTE — Telephone Encounter (Signed)
I called the pharmacy.  Spoke with pharmacist, Nicki Reaper.  He verified they have a record of patient picking up #180 Hydrocodone on 09/13, which is a 45 day supply.  Says they have current Rx on file, and will be able to fill when it's due.  I called the patient back.  Got no answer.  Left message relaying info provided by pharmacy.

## 2015-03-11 NOTE — Telephone Encounter (Signed)
Patient called back, states no one ever called her back. I advised Tina Griffith called her back yesterday and didn't get an answer, left message. Patient states she didn't get a message. Patient wants to know if they are going to write her another prescription or what? I advised I will send message to have Tina Griffith call her back. [phone].

## 2015-03-11 NOTE — Telephone Encounter (Signed)
I called the patient back at the number provided.  Again, did not get an answer.  Left message relaying info from pharmacy.  They indicate a new Rx is not needed, as they will fill the one they have on file when it is due.

## 2015-03-27 ENCOUNTER — Other Ambulatory Visit: Payer: Self-pay | Admitting: Neurology

## 2015-03-27 MED ORDER — MORPHINE SULFATE 30 MG PO TABS: 30.0000 mg | ORAL_TABLET | Freq: Three times a day (TID) | ORAL | Status: DC

## 2015-03-27 MED ORDER — HYDROCODONE-ACETAMINOPHEN 10-325 MG PO TABS: 1.0000 | ORAL_TABLET | Freq: Four times a day (QID) | ORAL | Status: DC | PRN

## 2015-03-27 NOTE — Telephone Encounter (Signed)
Called pt to r/s 11/16 appt and she stated that she called during lunch today wanting to know if her morphine rx will be able to be picked up Monday 03/31/15.

## 2015-03-28 ENCOUNTER — Encounter: Payer: Self-pay | Admitting: *Deleted

## 2015-03-28 NOTE — Progress Notes (Signed)
Hydrocodone and Morphine rx's up front [facility]/fim 

## 2015-04-09 ENCOUNTER — Encounter: Payer: Self-pay | Admitting: Neurology

## 2015-04-09 ENCOUNTER — Ambulatory Visit (INDEPENDENT_AMBULATORY_CARE_PROVIDER_SITE_OTHER): Payer: Medicare Other | Admitting: Neurology

## 2015-04-09 VITALS — BP 136/88 | HR 90 | Resp 14 | Ht 66.0 in | Wt 165.0 lb

## 2015-04-09 DIAGNOSIS — R261 Paralytic gait: Secondary | ICD-10-CM | POA: Diagnosis not present

## 2015-04-09 DIAGNOSIS — R531 Weakness: Secondary | ICD-10-CM

## 2015-04-09 DIAGNOSIS — M6281 Muscle weakness (generalized): Secondary | ICD-10-CM

## 2015-04-09 DIAGNOSIS — G35 Multiple sclerosis: Secondary | ICD-10-CM | POA: Diagnosis not present

## 2015-04-09 DIAGNOSIS — R350 Frequency of micturition: Secondary | ICD-10-CM

## 2015-04-09 DIAGNOSIS — G8110 Spastic hemiplegia affecting unspecified side: Secondary | ICD-10-CM | POA: Diagnosis not present

## 2015-04-09 MED ORDER — HYDROCODONE-ACETAMINOPHEN 10-325 MG PO TABS: 1.0000 | ORAL_TABLET | Freq: Four times a day (QID) | ORAL | Status: DC | PRN

## 2015-04-09 MED ORDER — TIZANIDINE HCL 4 MG PO TABS: 8.0000 mg | ORAL_TABLET | Freq: Three times a day (TID) | ORAL | Status: DC

## 2015-04-09 MED ORDER — BACLOFEN 10 MG PO TABS: 10.0000 mg | ORAL_TABLET | Freq: Four times a day (QID) | ORAL | Status: DC

## 2015-04-09 MED ORDER — MORPHINE SULFATE 30 MG PO TABS: 30.0000 mg | ORAL_TABLET | Freq: Three times a day (TID) | ORAL | Status: DC

## 2015-04-09 MED ORDER — TRAMADOL HCL 50 MG PO TABS: ORAL_TABLET | ORAL | Status: DC

## 2015-04-09 MED ORDER — GABAPENTIN 300 MG PO CAPS: 300.0000 mg | ORAL_CAPSULE | Freq: Three times a day (TID) | ORAL | Status: DC

## 2015-04-09 NOTE — Progress Notes (Signed)
[facility]  PATIENT: Tina Griffith DOB: 1960-07-29  REFERRING DOCTOR OR PCP:  None currently SOURCE: patient  _________________________________   HISTORICAL  CHIEF COMPLAINT:  Chief Complaint  Patient presents with  . Multiple Sclerosis    Sts. muscle spasms/stiffness, gait/balance are worse.  Denies missed doses of Copaxone/fim    HISTORY OF PRESENT ILLNESS:  Tina Griffith is a 54 year old woman with multiple sclerosis diagnosed in July 2005. Her main problems are left leg weakness, spasticity and pain and poor gait.     Gait/strength/sensation/pain: Her main problem is left leg weakness and spasticity. Gait is spastic and wide. She uses a walker outside the house. She also has spasticity and decreased function in the left arm, though the leg is much worse than the arm.   She has a brace that goes over the left knee that is very heavy so usually she just uses her AFO.  Tizanidine 8 mg 3 times a day helps a little bit. She noted only a mild benefit with the higher dose. She is on morphine IR 30 mg 3 times a day and hydrocodone 10/325 up to 3-4 times a day. With these medicines she notes benefit with less pain. She tolerates the medicine fairly well with the exception constipation. She takes Linzess plus MiraLAX with a lot of benefit for the constipation.   She takes Ampyra and notes an improvement in her gait.   She tolerates it well.  Bladder/bowel: She has notes urinary frequency, helped by oxybutynin tid.   She has no recent UTI . She denies any significant hesitancy and feels that she usually empties her bladder well.  She has had constipation that is likely more due to the pain medicines than to the MS. Constipation improved a lo with Linzess plus MiraLAX.  Vision: She denies any MS related vision problems. She wears glasses with benefit.  Fatigue/sleep: She feels that her energy level is variable and was better when she was sleeping better.  She wakes up  very energized and has no difficulty doing activities that she sets out to do. She sleeps poorly, waking up every couple hours.   .  Mood/cognition: She denies depression. Her anxiety is doing much better. She no longer needs medicines (was on buspirone). She denies any significant cognitive problems and notes no problems with short-term memory. She feels her thinking skills are unchanged where there were a few years ago.  MS History:   She presented with left leg spasticity and weakness and an MRI was consistent with multiple sclerosis. Initially, she was placed on Betaseron but had some exacerbations with worsening leg function. She then switched to Copaxone. She has been tolerating Copaxone pretty well and recently switched to the 40 mg every other day dose. She denies any recent exacerbation.    MRI of the brain 05/09/2013 showed prominent white matter changes in the right cerebellum and the white matter of both hemispheres in a pattern and configuration consistent with multiple sclerosis. There were no enhancing lesions. When the study was compared to a prior MRI dated 01/11/2011, there was no new or progressive findings.    REVIEW OF SYSTEMS: Constitutional: No fevers, chills, sweats, or change in appetite.   Notes fatigue Eyes: No visual changes, double vision, eye pain Ear, nose and throat: No hearing loss, ear pain, nasal congestion, sore throat Cardiovascular: No chest pain, palpitations Respiratory: No shortness of breath at rest or with exertion.   No wheezes GastrointestinaI: Notes constipation.   No  pain Genitourinary: Notes urinary frequency. Musculoskeletal: No neck pain, some back pain, lots of left > right leg pain Integumentary: No rash, pruritus, skin lesions Neurological: as above Psychiatric: No depression at this time.  No anxiety Endocrine: No palpitations, diaphoresis, change in appetite, change in weigh or increased thirst Hematologic/Lymphatic: No anemia, purpura,  petechiae. Allergic/Immunologic: No itchy/runny eyes, nasal congestion, recent allergic reactions, rashes  ALLERGIES: Allergies  Allergen Reactions  . No Known Allergies     HOME MEDICATIONS:  Current outpatient prescriptions:  .  COPAXONE 40 MG/ML SOSY, Inject subcutaneously 40mg   three times weekly, Disp: 12 Syringe, Rfl: 11 .  dalfampridine 10 MG TB12, Take 1 tablet (10 mg total) by mouth 2 (two) times daily., Disp: 60 tablet, Rfl: 11 .  ENSURE (ENSURE), Frequency:   Dosage:0     Instructions:  Note:One can by mouth twice a day, Disp: , Rfl:  .  fluticasone (FLONASE) 50 MCG/ACT nasal spray, ONE TO TWO SPRAYS EACH NOSTRIL EVERY MORNING AS NEEDED, Disp: , Rfl:  .  furosemide (LASIX) 40 MG tablet, Take 40 mg by mouth daily., Disp: , Rfl:  .  gabapentin (NEURONTIN) 100 MG capsule, Take 1 tablet by mouth for 2 nights, then take 1 tablet twice daily for 2 days, then 1 tablet 3 times daily, Disp: , Rfl:  .  Glatiramer Acetate 40 MG/ML SOSY, Inject 40 mg into the skin 3 (three) times a week. Inject 40mg  (one syringe) SQ 3 times weekly, Disp: 12 Syringe, Rfl: 11 .  hydrochlorothiazide (HYDRODIURIL) 25 MG tablet, Take 25 mg by mouth daily., Disp: , Rfl:  .  HYDROcodone-acetaminophen (NORCO) 10-325 MG tablet, Take 1 tablet by mouth every 6 (six) hours as needed., Disp: 180 tablet, Rfl: 0 .  Magnesium Oxide 250 MG TABS, TAKE 2 TABS BY MOUTH DAILY WITH BREAKFAST, Disp: , Rfl:  .  meloxicam (MOBIC) 15 MG tablet, TAKE 1 TABLET DAILY WITH FOOD, Disp: , Rfl:  .  morphine (MSIR) 30 MG tablet, Take 1 tablet (30 mg total) by mouth 3 (three) times daily., Disp: 90 tablet, Rfl: 0 .  nicotine (RA NICOTINE) 14 mg/24hr patch, Place onto the skin., Disp: , Rfl:  .  oxybutynin (DITROPAN) 5 MG tablet, TAKE 2 TABLETS 3 TIMES A DAY. IF THIS CAUSES DRY MOUTH, DECREASE BACKTO ONE TABLET 3 TIMES A DAY, Disp: 180 tablet, Rfl: 11 .  polyethylene glycol (MIRALAX / GLYCOLAX) packet, Take 17 g by mouth daily., Disp: 30 each,  Rfl: 5 .  sulfamethoxazole-trimethoprim (BACTRIM DS,SEPTRA DS) 800-160 MG per tablet, Take 1 tablet by mouth 2 (two) times daily., Disp: 14 tablet, Rfl: 0 .  tiZANidine (ZANAFLEX) 4 MG tablet, 2 tablets po TID, Disp: , Rfl:  .  traMADol (ULTRAM) 50 MG tablet, Take 2 tablets 3 times daily as needed for pain., Disp: 180 tablet, Rfl: 3 .  venlafaxine XR (EFFEXOR-XR) 37.5 MG 24 hr capsule, , Disp: , Rfl:  .  Linaclotide (LINZESS) 145 MCG CAPS capsule, Take by mouth., Disp: , Rfl:  .  Lubiprostone (AMITIZA PO), Take by mouth., Disp: , Rfl:  .  omeprazole (PRILOSEC) 20 MG capsule, TAKE 1 CAPSULE DAILY 30 MINUTES BEFORE BREAKFAST, Disp: , Rfl:  .  potassium chloride (K-DUR) 10 MEQ tablet, Take by mouth., Disp: , Rfl:   PAST MEDICAL HISTORY: Past Medical History  Diagnosis Date  . Constipation   . MS (multiple sclerosis) ([place])   . Hypertension   . Movement disorder   . Vision abnormalities  PAST SURGICAL HISTORY: Past Surgical History  Procedure Laterality Date  . Colostomy    . Colostomy reversal    . Knee surgery    . Hernia repair      FAMILY HISTORY: Family History  Problem Relation Age of Onset  . Lung cancer Mother   . Stroke Mother   . Diabetes Father     SOCIAL HISTORY:  Social History   Social History  . Marital Status: Single    Spouse Name: N/A  . Number of Children: N/A  . Years of Education: N/A   Occupational History  . Not on file.   Social History Main Topics  . Smoking status: Current Every Day Smoker -- 1.00 packs/day    Types: Cigarettes  . Smokeless tobacco: Not on file  . Alcohol Use: No  . Drug Use: No  . Sexual Activity: Not on file   Other Topics Concern  . Not on file   Social History Narrative     PHYSICAL EXAM  Filed Vitals:   04/09/15 1449  BP: 136/88  Pulse: 90  Resp: 14  Height: 5\' 6"  (1.676 m)  Weight: 165 lb (74.844 kg)    Body mass index is 26.64 kg/(m^2).   General: The patient is well-developed and  well-nourished and in no acute distress  Musculoskeletal:  Back is nontender  Neurologic Exam  Mental status: The patient is alert and oriented x 3 at the time of the examination. The patient has apparent normal recent and remote memory, with an apparently normal attention span and concentration ability.   Speech is normal.  Cranial nerves: Extraocular movements are full.    Facial symmetry is present. There is good facial sensation to soft touch bilaterally.Facial strength is normal.  Trapezius and sternocleidomastoid strength is normal. No dysarthria is noted.  The tongue is midline, and the patient has symmetric elevation of the soft palate. No obvious hearing deficits are noted.  Motor:  Muscle bulk is normal.   Tone is increased in legs, left worse than right. Strength is  4+/5 in right arm and leg, 4-/5 in left leg and 5/5 in left arm   Sensory: Sensory testing is intact to pinprick, soft touch and vibration sensation in all 4 extremities.  Coordination: She has an intention tremor in arms and legs, worse in right arm and both legs.  Cerebellar testing reveals reduced right finger-nose-finger and reduced right and very poor left heel-to-shin .   Right RAM in arm is slowed  Gait and station: Station is normal.   Gait is spastic and with left foot drop. She cannot tandem walk. Romberg is negative.   Reflexes: Deep tendon reflexes are increased in legs, worse on the left.   Marland Kitchen    DIAGNOSTIC DATA (LABS, IMAGING, TESTING) - I reviewed patient records, labs, notes, testing and imaging myself where available.  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   Continue medications. Refills were provided. We discussed not taking more than the prescribed amount of medication in any day. 2.    Continue Copaxone. 3.    Stay active and  exercises as tolerated.. 4.   She will return to see me in 4 months or sooner if she has new or worsening neurologic symptoms.  Laquinton Bihm A. [name], MD, PhD [id number], 0000000 PM Certified in Neurology, Clinical Neurophysiology, Sleep Medicine, Pain  Medicine and Neuroimaging  [facility] [address], [place], [place] [address] [phone]

## 2015-04-11 ENCOUNTER — Telehealth: Payer: Self-pay | Admitting: Neurology

## 2015-04-11 NOTE — Telephone Encounter (Signed)
Ins has been contacted and provided with clinical info.  Request is currently under review Ref # [id number]  [facility] [facility]) has approved the request for coverage on Ampyra effective until 04/14/2016 Ref # [id number] Ref Member # [id number] They indication they have notified the patient of this decision.  We have provided approval info to [employer].

## 2015-04-11 NOTE — Telephone Encounter (Signed)
Pt called sts needs prior auth for dalfampridine 10 MG TB12 . She has enough thru Wednesday 04-16-15.

## 2015-04-14 ENCOUNTER — Telehealth: Payer: Self-pay | Admitting: Neurology

## 2015-04-14 NOTE — Telephone Encounter (Signed)
Patient called to request letter from Dr. [name] stating that she needs someone to be with her at night.

## 2015-04-21 ENCOUNTER — Telehealth: Payer: Self-pay | Admitting: Neurology

## 2015-04-21 ENCOUNTER — Encounter: Payer: Self-pay | Admitting: *Deleted

## 2015-04-21 NOTE — Telephone Encounter (Signed)
I have spoken with [place] today--she sts. she needs 24 hr. caregiver support.  Per RAS ok, letter printed, signed, mailed to [place] home address/fim

## 2015-04-21 NOTE — Telephone Encounter (Signed)
Patient called to check status of letter requesting that she needs someone to be with her at night also. She needs round the clock care.

## 2015-04-21 NOTE — Telephone Encounter (Signed)
Patient is calling about her written Rx hydrocodone-acetaminophen 10-325. Her Rx was written for 2 months-November and December.  She states she cannot get her Rx until the December 4th at her pharmacy.  Please call.

## 2015-04-21 NOTE — Telephone Encounter (Signed)
I called the pharmacy.  They indicate the patient has a Rx on file that they can fill today.  They stated they will call the patient to advise once Rx is ready.  I called the patient back and relayed this info.  She expressed understanding.

## 2015-04-22 ENCOUNTER — Telehealth: Payer: Self-pay | Admitting: Neurology

## 2015-04-22 NOTE — Telephone Encounter (Signed)
Nikki with [facility] pharmacy called requesting verbal order for autoject for copaxone. She can be reached at [phone] opt [facility]

## 2015-04-22 NOTE — Telephone Encounter (Signed)
I called back.  Spoke with Ander Purpura.  She was not able to assist and transferred me to [place].  She asked if it would be okay to dispense a replacement auto inject unit for the patient.  I advised Amelia Jo was permissible.  They will proceed with dispensing unit, and will call us back if anything further is needed.

## 2015-05-15 ENCOUNTER — Other Ambulatory Visit: Payer: Self-pay | Admitting: Neurology

## 2015-05-22 ENCOUNTER — Telehealth: Payer: Self-pay

## 2015-05-22 NOTE — Telephone Encounter (Signed)
Thank you!  Rx has been added and sent.

## 2015-05-22 NOTE — Telephone Encounter (Signed)
Ok to fill 

## 2015-05-22 NOTE — Telephone Encounter (Signed)
I spoke with patient.  She is requesting a Rx for Citalopram 20mg  one daily (not currently on med list).  Okay to fill?  Thank you.

## 2015-05-29 ENCOUNTER — Encounter: Payer: Self-pay | Admitting: *Deleted

## 2015-05-29 ENCOUNTER — Other Ambulatory Visit: Payer: Self-pay | Admitting: Neurology

## 2015-05-29 MED ORDER — MORPHINE SULFATE 30 MG PO TABS: 30.0000 mg | ORAL_TABLET | Freq: Three times a day (TID) | ORAL | Status: DC

## 2015-05-29 MED ORDER — HYDROCODONE-ACETAMINOPHEN 10-325 MG PO TABS: 1.0000 | ORAL_TABLET | Freq: Four times a day (QID) | ORAL | Status: DC | PRN

## 2015-05-29 NOTE — Progress Notes (Signed)
Hydrocodone and MSIR rx's up front [facility]/fim 

## 2015-05-29 NOTE — Telephone Encounter (Signed)
Patient is calling to get a written Rx for morphine (MSIR) 30 MG tablet. I advised the Rx will be ready in 24 hours unless otherwise advised by the nurse. Thank you.

## 2015-06-17 ENCOUNTER — Other Ambulatory Visit: Payer: Self-pay

## 2015-06-17 MED ORDER — COPAXONE 40 MG/ML ~~LOC~~ SOSY: PREFILLED_SYRINGE | SUBCUTANEOUS | Status: DC

## 2015-06-20 DIAGNOSIS — E781 Pure hyperglyceridemia: Secondary | ICD-10-CM | POA: Insufficient documentation

## 2015-06-30 ENCOUNTER — Telehealth: Payer: Self-pay | Admitting: Neurology

## 2015-06-30 MED ORDER — MORPHINE SULFATE 30 MG PO TABS: 30.0000 mg | ORAL_TABLET | Freq: Three times a day (TID) | ORAL | Status: DC

## 2015-06-30 NOTE — Telephone Encounter (Signed)
I have spoken with [place].  Morphine rx. printed, signed, up front [facility]. 2 more copies of letter previously given printed, signed, up front with rx/fim

## 2015-06-30 NOTE — Telephone Encounter (Signed)
Patient is calling and needs a written Rx Morphine (MSIR) 30 mg tablets.  Thanks! Also, patient states she needs 2 more letters stating she cannot be left alone.

## 2015-07-28 ENCOUNTER — Telehealth: Payer: Self-pay | Admitting: Neurology

## 2015-07-28 MED ORDER — MORPHINE SULFATE 30 MG PO TABS: 30.0000 mg | ORAL_TABLET | Freq: Three times a day (TID) | ORAL | Status: DC

## 2015-07-28 MED ORDER — HYDROCODONE-ACETAMINOPHEN 10-325 MG PO TABS: 1.0000 | ORAL_TABLET | Freq: Four times a day (QID) | ORAL | Status: DC | PRN

## 2015-07-28 MED ORDER — TRAMADOL HCL 50 MG PO TABS: ORAL_TABLET | ORAL | Status: DC

## 2015-07-28 NOTE — Telephone Encounter (Signed)
Rx's up front [facility]/fim 

## 2015-07-28 NOTE — Telephone Encounter (Signed)
Pt called requesting refill for HYDROcodone-acetaminophen (NORCO) 10-325 MG tablet and traMADol (ULTRAM) 50 MG tablet andmorphine (MSIR) 30 MG tablet. She is requesting 2 mth refill.

## 2015-07-28 NOTE — Telephone Encounter (Signed)
Hydrocodone, Tramadol and MSIR rx's awaiting RAS sig/fim

## 2015-08-01 NOTE — Telephone Encounter (Signed)
PC with Tina Griffith this morning.  Tramadol rx. was postdated for due date and can't be filled early.  She verbalized understanding of same, will have it filled on due date/fim

## 2015-08-01 NOTE — Telephone Encounter (Signed)
Pt called said she was instructed by pharmacy traMADol (ULTRAM) 50 MG tablet could not picked up 08/07/15. Sts she's been getting it on the 9th of every month. She will not have enough to last until the 16th. Please call and advise at [phone]

## 2015-08-07 ENCOUNTER — Ambulatory Visit: Payer: Medicare Other | Admitting: Neurology

## 2015-08-13 ENCOUNTER — Encounter: Payer: Self-pay | Admitting: Neurology

## 2015-08-15 ENCOUNTER — Encounter: Payer: Self-pay | Admitting: *Deleted

## 2015-08-15 ENCOUNTER — Encounter: Payer: Self-pay | Admitting: Neurology

## 2015-08-15 ENCOUNTER — Ambulatory Visit (INDEPENDENT_AMBULATORY_CARE_PROVIDER_SITE_OTHER): Payer: Medicare Other | Admitting: Neurology

## 2015-08-15 VITALS — BP 146/88 | HR 80 | Resp 18 | Ht 66.0 in | Wt 165.0 lb

## 2015-08-15 DIAGNOSIS — G8110 Spastic hemiplegia affecting unspecified side: Secondary | ICD-10-CM | POA: Diagnosis not present

## 2015-08-15 DIAGNOSIS — G35 Multiple sclerosis: Secondary | ICD-10-CM | POA: Diagnosis not present

## 2015-08-15 DIAGNOSIS — M79605 Pain in left leg: Secondary | ICD-10-CM | POA: Diagnosis not present

## 2015-08-15 DIAGNOSIS — R261 Paralytic gait: Secondary | ICD-10-CM

## 2015-08-15 DIAGNOSIS — R350 Frequency of micturition: Secondary | ICD-10-CM

## 2015-08-15 MED ORDER — MORPHINE SULFATE 30 MG PO TABS: 30.0000 mg | ORAL_TABLET | Freq: Three times a day (TID) | ORAL | Status: DC

## 2015-08-15 MED ORDER — HYDROCODONE-ACETAMINOPHEN 10-325 MG PO TABS: 1.0000 | ORAL_TABLET | Freq: Four times a day (QID) | ORAL | Status: DC | PRN

## 2015-08-15 NOTE — Progress Notes (Signed)
[facility]  PATIENT: Tina Griffith DOB: 03/05/1961  REFERRING DOCTOR OR PCP:  None currently SOURCE: patient  _________________________________   HISTORICAL  CHIEF COMPLAINT:  Chief Complaint  Patient presents with  . Multiple Sclerosis    Sts. she continues to tolerate Copaxone well.  Sts. balance is worse, esp. in the mornings/fim    HISTORY OF PRESENT ILLNESS:  Tina Griffith is a 55 year old woman with multiple sclerosis diagnosed in July 2005. Her main problems are left leg weakness, spasticity and pain and poor gait.     Gait/strength/sensation/pain: She notes difficulty getting out of bed and often falls back to teh bed (but not the floor).    She has left leg weakness and spasticity. Gait is spastic and wide. She uses a walker outside the house.    She can walk about 100 feet without stopping. She also has mild spasticity and decreased function in the left arm.   She has a brace that goes over the left knee that is very heavy so usually she just uses her old AFO.  Tizanidine 8 mg 3 times a day helps a little bit. She noted only a mild benefit with the higher dose.   She takes Ampyra and notes an improvement in her gait.   She tolerates it well.  Pain:   Pain is mostly in the left leg.   She is on morphine IR 30 mg 3 times a day and hydrocodone 10/325 up to 3-4 times a da and tramadol tid.    With these medicines she notes benefit with less pain though she reports pain is still bad at times.   . She tolerates the medicine fairly well with the exception constipation. She takes Linzess plus MiraLAX with a lot of benefit for the constipation.   Bladder/bowel: She has notes urinary frequency, helped by oxybutynin tid.   She has no recent UTI . She denies any significant hesitancy and feels that she usually empties her bladder well.  She has had constipation that is likely more due to the pain medicines than to the MS. Constipation improved a lo with Linzess plus  MiraLAX.  Vision: She notes some blurry vision, better with glasses.      Fatigue/sleep: She feels that her energy level is variable and was better when she was sleeping better.  She wakes up very energized and has no difficulty doing activities that she sets out to do. She sleeps poorly, waking up every couple hours.    Mood/cognition: She denies depression but has some anxiety. She has some beenfit with Effexor and buspirone.   She denies any significant cognitive problems and notes no problems with short-term memory. She feels her thinking skills are unchanged where there were a few years ago.  MS History:   She presented with left leg spasticity and weakness and an MRI was consistent with multiple sclerosis. Initially, she was placed on Betaseron but had some exacerbations with worsening leg function. She then switched to Copaxone. She has been tolerating Copaxone pretty well and recently switched to the 40 mg every other day dose. She denies any recent exacerbation.    MRI of the brain 05/09/2013 showed prominent white matter changes in the right cerebellum and the white matter of both hemispheres in a pattern and configuration consistent with multiple sclerosis. There were no enhancing lesions. When the study was compared to a prior MRI dated 01/11/2011, there was no new or progressive findings.    REVIEW OF SYSTEMS: Constitutional:  No fevers, chills, sweats, or change in appetite.   Notes fatigue Eyes: No visual changes, double vision, eye pain Ear, nose and throat: No hearing loss, ear pain, nasal congestion, sore throat Cardiovascular: No chest pain, palpitations Respiratory: No shortness of breath at rest or with exertion.   No wheezes GastrointestinaI: Notes constipation.   No pain Genitourinary: Notes urinary frequency. Musculoskeletal: No neck pain, some back pain, lots of left > right leg pain Integumentary: No rash, pruritus, skin lesions Neurological: as above Psychiatric:  No depression at this time.  No anxiety Endocrine: No palpitations, diaphoresis, change in appetite, change in weigh or increased thirst Hematologic/Lymphatic: No anemia, purpura, petechiae. Allergic/Immunologic: No itchy/runny eyes, nasal congestion, recent allergic reactions, rashes  ALLERGIES: Allergies  Allergen Reactions  . No Known Allergies     HOME MEDICATIONS:  Current outpatient prescriptions:  .  baclofen (LIORESAL) 10 MG tablet, Take 1 tablet (10 mg total) by mouth 4 (four) times daily., Disp: 120 each, Rfl: 5 .  citalopram (CELEXA) 20 MG tablet, TAKE 1 TABLET DAILY, Disp: 30 tablet, Rfl: 6 .  dalfampridine 10 MG TB12, Take 1 tablet (10 mg total) by mouth 2 (two) times daily., Disp: 60 tablet, Rfl: 11 .  ENSURE (ENSURE), Frequency:   Dosage:0     Instructions:  Note:One can by mouth twice a day, Disp: , Rfl:  .  fluticasone (FLONASE) 50 MCG/ACT nasal spray, ONE TO TWO SPRAYS EACH NOSTRIL EVERY MORNING AS NEEDED, Disp: , Rfl:  .  furosemide (LASIX) 40 MG tablet, Take 40 mg by mouth daily., Disp: , Rfl:  .  gabapentin (NEURONTIN) 300 MG capsule, Take 1 capsule (300 mg total) by mouth 3 (three) times daily., Disp: 90 capsule, Rfl: 11 .  Glatiramer Acetate 40 MG/ML SOSY, Inject 40 mg into the skin 3 (three) times a week. Inject 40mg  (one syringe) SQ 3 times weekly, Disp: 12 Syringe, Rfl: 11 .  hydrochlorothiazide (HYDRODIURIL) 25 MG tablet, Take 25 mg by mouth daily., Disp: , Rfl:  .  HYDROcodone-acetaminophen (NORCO) 10-325 MG tablet, Take 1 tablet by mouth every 6 (six) hours as needed., Disp: 180 tablet, Rfl: 0 .  Lubiprostone (AMITIZA PO), Take by mouth., Disp: , Rfl:  .  Magnesium Oxide 250 MG TABS, TAKE 2 TABS BY MOUTH DAILY WITH BREAKFAST, Disp: , Rfl:  .  morphine (MSIR) 30 MG tablet, Take 1 tablet (30 mg total) by mouth 3 (three) times daily., Disp: 90 tablet, Rfl: 0 .  oxybutynin (DITROPAN) 5 MG tablet, TAKE 2 TABLETS 3 TIMES A DAY. IF THIS CAUSES DRY MOUTH, DECREASE  BACKTO ONE TABLET 3 TIMES A DAY, Disp: 180 tablet, Rfl: 11 .  polyethylene glycol (MIRALAX / GLYCOLAX) packet, Take 17 g by mouth daily., Disp: 30 each, Rfl: 5 .  sulfamethoxazole-trimethoprim (BACTRIM DS,SEPTRA DS) 800-160 MG per tablet, Take 1 tablet by mouth 2 (two) times daily., Disp: 14 tablet, Rfl: 0 .  tiZANidine (ZANAFLEX) 4 MG tablet, Take 2 tablets (8 mg total) by mouth 3 (three) times daily., Disp: 180 tablet, Rfl: 11 .  traMADol (ULTRAM) 50 MG tablet, Take 2 tablets 3 times daily as needed for pain., Disp: 180 tablet, Rfl: 3 .  Linaclotide (LINZESS) 145 MCG CAPS capsule, Take by mouth., Disp: , Rfl:  .  nicotine (RA NICOTINE) 14 mg/24hr patch, Place onto the skin. Reported on 08/15/2015, Disp: , Rfl:  .  omeprazole (PRILOSEC) 20 MG capsule, TAKE 1 CAPSULE DAILY 30 MINUTES BEFORE BREAKFAST, Disp: , Rfl:  .  potassium chloride (K-DUR) 10 MEQ tablet, Take by mouth., Disp: , Rfl:  .  venlafaxine XR (EFFEXOR-XR) 37.5 MG 24 hr capsule, , Disp: , Rfl:   PAST MEDICAL HISTORY: Past Medical History  Diagnosis Date  . Constipation   . MS (multiple sclerosis) ([place])   . Hypertension   . Movement disorder   . Vision abnormalities     PAST SURGICAL HISTORY: Past Surgical History  Procedure Laterality Date  . Colostomy    . Colostomy reversal    . Knee surgery    . Hernia repair      FAMILY HISTORY: Family History  Problem Relation Age of Onset  . Lung cancer Mother   . Stroke Mother   . Diabetes Father     SOCIAL HISTORY:  Social History   Social History  . Marital Status: Single    Spouse Name: N/A  . Number of Children: N/A  . Years of Education: N/A   Occupational History  . Not on file.   Social History Main Topics  . Smoking status: Current Every Day Smoker -- 1.00 packs/day    Types: Cigarettes  . Smokeless tobacco: Not on file  . Alcohol Use: No  . Drug Use: No  . Sexual Activity: Not on file   Other Topics Concern  . Not on file   Social History  Narrative     PHYSICAL EXAM  Filed Vitals:   08/15/15 1142  BP: 146/88  Pulse: 80  Resp: 18  Height: 5\' 6"  (1.676 m)  Weight: 165 lb (74.844 kg)    Body mass index is 26.64 kg/(m^2).   General: The patient is well-developed and well-nourished and in no acute distress  Musculoskeletal:  Back is nontender  Neurologic Exam  Mental status: The patient is alert and oriented x 3 at the time of the examination. The patient has apparent normal recent and remote memory, with an apparently normal attention span and concentration ability.   Speech is normal.  Cranial nerves: Extraocular movements are full.    Facial symmetry is present. There is good facial sensation to soft touch bilaterally.Facial strength is normal.  Trapezius and sternocleidomastoid strength is normal. No dysarthria is noted.  The tongue is midline, and the patient has symmetric elevation of the soft palate. No obvious hearing deficits are noted.  Motor:  Muscle bulk is normal.   Tone is increased in legs, left worse than right. Strength is  4+/5 in left arm and leg, 4-/5 in left leg and 5/5 in left arm   Sensory: Sensory testing is intact to pinprick, soft touch and vibration sensation in all 4 extremities.  Coordination: She has an intention tremor in arms and legs.   Cerebellar testing reveals ,mildly reduced  finger-nose-finger and reduced right and very poor left heel-to-shin .    RAM in left arm is slowed  Gait and station: Station is normal.   Gait is spastic and with left foot drop. She cannot tandem walk. Romberg is negative.   Reflexes: Deep tendon reflexes are increased in legs, worse on the left.   Marland Kitchen    DIAGNOSTIC DATA (LABS, IMAGING, TESTING) - I reviewed patient records, labs, notes, testing and imaging myself where available.  Records from [facility] neurology were reviewed   ASSESSMENT AND PLAN  Multiple sclerosis ([place])  Spastic hemiplegia (HCC)  Leg pain, left  Spastic gait  Urinary  frequency   1.    Continue Pain medications. Refills were provided. We discussed not taking more  than the prescribed amount of medication in any day. 2.    Continue Copaxone. 3.    Stay active and exercises as tolerated.. 4.    She will return to see me in 4 months or sooner if she has new or worsening neurologic symptoms.  Richard A. [name], MD, PhD [id number], [id number] AM Certified in Neurology, Clinical Neurophysiology, Sleep Medicine, Pain Medicine and Neuroimaging  [facility] [address], [place], [place] [address] [phone]

## 2015-08-25 ENCOUNTER — Other Ambulatory Visit: Payer: Self-pay | Admitting: *Deleted

## 2015-08-25 MED ORDER — DALFAMPRIDINE ER 10 MG PO TB12: 10.0000 mg | ORAL_TABLET | Freq: Two times a day (BID) | ORAL | Status: DC

## 2015-08-25 NOTE — Telephone Encounter (Signed)
Ampyra r/f per faxed request/fim

## 2015-09-15 ENCOUNTER — Telehealth: Payer: Self-pay | Admitting: Neurology

## 2015-09-15 NOTE — Telephone Encounter (Signed)
Patient called requesting to speak to nurse, states she has been falling, fell 3 times in the past week, doesn't know what's causing her to do this.

## 2015-09-15 NOTE — Telephone Encounter (Signed)
I have spoken with Tina Griffith this am.  She c/o increased difficulty with gait/balance.  I offered appt. with RAS today but she is unable to make it.  Appt. given for tomorrow at 1120/fim

## 2015-09-16 ENCOUNTER — Encounter: Payer: Self-pay | Admitting: Neurology

## 2015-09-16 ENCOUNTER — Ambulatory Visit (INDEPENDENT_AMBULATORY_CARE_PROVIDER_SITE_OTHER): Payer: Medicare Other | Admitting: Neurology

## 2015-09-16 VITALS — BP 164/86 | HR 72 | Resp 16 | Ht 66.0 in | Wt 165.0 lb

## 2015-09-16 DIAGNOSIS — Z79899 Other long term (current) drug therapy: Secondary | ICD-10-CM | POA: Diagnosis not present

## 2015-09-16 DIAGNOSIS — G35 Multiple sclerosis: Secondary | ICD-10-CM

## 2015-09-16 DIAGNOSIS — R261 Paralytic gait: Secondary | ICD-10-CM | POA: Diagnosis not present

## 2015-09-16 DIAGNOSIS — G8110 Spastic hemiplegia affecting unspecified side: Secondary | ICD-10-CM | POA: Diagnosis not present

## 2015-09-16 DIAGNOSIS — R350 Frequency of micturition: Secondary | ICD-10-CM | POA: Diagnosis not present

## 2015-09-16 MED ORDER — HYDROCODONE-ACETAMINOPHEN 10-325 MG PO TABS: 1.0000 | ORAL_TABLET | Freq: Four times a day (QID) | ORAL | Status: DC | PRN

## 2015-09-16 MED ORDER — MORPHINE SULFATE 30 MG PO TABS: 30.0000 mg | ORAL_TABLET | Freq: Three times a day (TID) | ORAL | Status: DC

## 2015-09-16 NOTE — Progress Notes (Addendum)
[facility]  PATIENT: Tina Griffith DOB: 10-Oct-1960  REFERRING DOCTOR OR PCP:  None currently SOURCE: patient  _________________________________   HISTORICAL  CHIEF COMPLAINT:  Chief Complaint  Patient presents with  . Multiple Sclerosis    Sts. she continues to tolerate Copaxone well and denies missed doses.  Sts. legs are weaker and she is falling more, feels vision is worse both eyes.  Onset one week ago/fim    HISTORY OF PRESENT ILLNESS:  Tina Griffith is a 55 year old woman with multiple sclerosis diagnosed in July 2005. Her main problems are left leg weakness, spasticity and pain and poor gait.  Over the past year, she has had more trouble with the right leg and arm.   Since last week, she reports more weakness in her legs and clumsiness in her hands.   She is falling more. She also notes that vision is worse.     Gait/strength/sensation/pain: She is having more trouble standing and more difficulty using walker.   She has left > right leg weakness and spasticity.  She was using a walker for short distance but now can only tak ea few steps.   She also has mild spasticity and decreased function in the left arm x several years but more recent worsening of the right arm.    Tizanidine 8 mg 3 times a day helps a little bit. She noted only a mild benefit with the higher dose.   She takes Ampyra and noted an improvement in her gait when we started.   She tolerates it well.  Pain:   Pain is mostly in the left leg.   She is on morphine IR 30 mg 3 times a day and hydrocodone 10/325 up to 3-4 times a day and tramadol tid.    With these medicines she notes benefit with less pain though she reports pain is still bad at times.   . She tolerates the medicine fairly well with the exception constipation. She takes Linzess plus MiraLAX with a lot of benefit for the constipation.   Bladder/bowel: She has notes urinary frequency, helped by oxybutynin tid.   She has no recent UTI  . She denies any significant hesitancy and feels that she usually empties her bladder well.  She has had constipation that is likely more due to the pain medicines than to the MS. Constipation improved a lo with Linzess plus MiraLAX.  Vision: She notes some blurry vision, better with glasses.      Fatigue/sleep: She feels that her energy level is much worse the past month.    She gets more tired as the say and was better when she was sleeping better.    She sleeps poorly many nights, waking up every couple hours.    Mood/cognition: She denies depression but has some anxiety. Mood is better with Effexor and buspirone.   She denies any significant cognitive problems and notes no problems with short-term memory. She feels her thinking skills are unchanged.  MS History:   She presented with left leg spasticity and weakness and an MRI was consistent with multiple sclerosis. Initially, she was placed on Betaseron but had some exacerbations with worsening leg function. She then switched to Copaxone. She has been tolerating Copaxone pretty well and recently switched to the 40 mg every other day dose. She denies any recent exacerbation.    MRI of the brain 05/09/2013 showed prominent white matter changes in the right cerebellum and the white matter of both hemispheres in a  pattern and configuration consistent with multiple sclerosis. There were no enhancing lesions. When the study was compared to a prior MRI dated 01/11/2011, there was no new or progressive findings.    REVIEW OF SYSTEMS: Constitutional: No fevers, chills, sweats, or change in appetite.   Notes fatigue Eyes: No visual changes, double vision, eye pain Ear, nose and throat: No hearing loss, ear pain, nasal congestion, sore throat Cardiovascular: No chest pain, palpitations Respiratory: No shortness of breath at rest or with exertion.   No wheezes GastrointestinaI: Notes constipation.   No pain Genitourinary: Notes urinary  frequency. Musculoskeletal: No neck pain, some back pain, lots of left > right leg pain Integumentary: No rash, pruritus, skin lesions Neurological: as above Psychiatric: No depression at this time.  No anxiety Endocrine: No palpitations, diaphoresis, change in appetite, change in weigh or increased thirst Hematologic/Lymphatic: No anemia, purpura, petechiae. Allergic/Immunologic: No itchy/runny eyes, nasal congestion, recent allergic reactions, rashes  ALLERGIES: Allergies  Allergen Reactions  . No Known Allergies     HOME MEDICATIONS:  Current outpatient prescriptions:  .  baclofen (LIORESAL) 10 MG tablet, Take 1 tablet (10 mg total) by mouth 4 (four) times daily., Disp: 120 each, Rfl: 5 .  citalopram (CELEXA) 20 MG tablet, TAKE 1 TABLET DAILY, Disp: 30 tablet, Rfl: 6 .  dalfampridine 10 MG TB12, Take 1 tablet (10 mg total) by mouth 2 (two) times daily., Disp: 60 tablet, Rfl: 11 .  ENSURE (ENSURE), Frequency:   Dosage:0     Instructions:  Note:One can by mouth twice a day, Disp: , Rfl:  .  fluticasone (FLONASE) 50 MCG/ACT nasal spray, ONE TO TWO SPRAYS EACH NOSTRIL EVERY MORNING AS NEEDED, Disp: , Rfl:  .  furosemide (LASIX) 40 MG tablet, Take 40 mg by mouth daily., Disp: , Rfl:  .  gabapentin (NEURONTIN) 300 MG capsule, Take 1 capsule (300 mg total) by mouth 3 (three) times daily., Disp: 90 capsule, Rfl: 11 .  Glatiramer Acetate 40 MG/ML SOSY, Inject 40 mg into the skin 3 (three) times a week. Inject 40mg  (one syringe) SQ 3 times weekly, Disp: 12 Syringe, Rfl: 11 .  hydrochlorothiazide (HYDRODIURIL) 25 MG tablet, Take 25 mg by mouth daily., Disp: , Rfl:  .  HYDROcodone-acetaminophen (NORCO) 10-325 MG tablet, Take 1 tablet by mouth every 6 (six) hours as needed., Disp: 180 tablet, Rfl: 0 .  Lubiprostone (AMITIZA PO), Take by mouth., Disp: , Rfl:  .  Magnesium Oxide 250 MG TABS, TAKE 2 TABS BY MOUTH DAILY WITH BREAKFAST, Disp: , Rfl:  .  morphine (MSIR) 30 MG tablet, Take 1 tablet  (30 mg total) by mouth 3 (three) times daily., Disp: 90 tablet, Rfl: 0 .  nicotine (RA NICOTINE) 14 mg/24hr patch, Place onto the skin. Reported on 08/15/2015, Disp: , Rfl:  .  oxybutynin (DITROPAN) 5 MG tablet, TAKE 2 TABLETS 3 TIMES A DAY. IF THIS CAUSES DRY MOUTH, DECREASE BACKTO ONE TABLET 3 TIMES A DAY, Disp: 180 tablet, Rfl: 11 .  polyethylene glycol (MIRALAX / GLYCOLAX) packet, Take 17 g by mouth daily., Disp: 30 each, Rfl: 5 .  sulfamethoxazole-trimethoprim (BACTRIM DS,SEPTRA DS) 800-160 MG per tablet, Take 1 tablet by mouth 2 (two) times daily., Disp: 14 tablet, Rfl: 0 .  tiZANidine (ZANAFLEX) 4 MG tablet, Take 2 tablets (8 mg total) by mouth 3 (three) times daily., Disp: 180 tablet, Rfl: 11 .  traMADol (ULTRAM) 50 MG tablet, Take 2 tablets 3 times daily as needed for pain., Disp: 180 tablet, Rfl:  3 .  venlafaxine XR (EFFEXOR-XR) 37.5 MG 24 hr capsule, , Disp: , Rfl:  .  Linaclotide (LINZESS) 145 MCG CAPS capsule, Take by mouth., Disp: , Rfl:  .  omeprazole (PRILOSEC) 20 MG capsule, TAKE 1 CAPSULE DAILY 30 MINUTES BEFORE BREAKFAST, Disp: , Rfl:  .  potassium chloride (K-DUR) 10 MEQ tablet, Take by mouth., Disp: , Rfl:   PAST MEDICAL HISTORY: Past Medical History  Diagnosis Date  . Constipation   . MS (multiple sclerosis) ([place])   . Hypertension   . Movement disorder   . Vision abnormalities     PAST SURGICAL HISTORY: Past Surgical History  Procedure Laterality Date  . Colostomy    . Colostomy reversal    . Knee surgery    . Hernia repair      FAMILY HISTORY: Family History  Problem Relation Age of Onset  . Lung cancer Mother   . Stroke Mother   . Diabetes Father     SOCIAL HISTORY:  Social History   Social History  . Marital Status: Single    Spouse Name: N/A  . Number of Children: N/A  . Years of Education: N/A   Occupational History  . Not on file.   Social History Main Topics  . Smoking status: Current Every Day Smoker -- 1.00 packs/day    Types:  Cigarettes  . Smokeless tobacco: Not on file  . Alcohol Use: No  . Drug Use: No  . Sexual Activity: Not on file   Other Topics Concern  . Not on file   Social History Narrative     PHYSICAL EXAM  Filed Vitals:   09/16/15 1152  BP: 164/86  Pulse: 72  Resp: 16  Height: 5\' 6"  (1.676 m)  Weight: 165 lb (74.844 kg)    Body mass index is 26.64 kg/(m^2).   General: The patient is well-developed and well-nourished and in no acute distress  Musculoskeletal:  Back is nontender  Neurologic Exam  Mental status: The patient is alert and oriented x 3 at the time of the examination. The patient has apparent normal recent and remote memory, with an apparently normal attention span and concentration ability.   Speech is normal.  Cranial nerves: Extraocular movements are full.  Color vision is worse out of the right eye.   1+ right APD  Facial symmetry is present. There is good facial sensation to soft touch bilaterally.Facial strength is normal.  Trapezius and sternocleidomastoid strength is normal. No dysarthria is noted.  The tongue is midline, and the patient has symmetric elevation of the soft palate. No obvious hearing deficits are noted.  Motor:  Muscle bulk is normal.   Tone is increased in legs > arms, left worse than right. Strength is  4/5 in left arm and leg, 3 to 4-/5 in left leg and 4+/5 in left arm   Sensory: Sensory testing is intact to pinprick, soft touch and vibration sensation in all 4 extremities.  Coordination: She has an intention tremor in arms and legs.   Cerebellar testing reveals ,mildly reduced  finger-nose-finger and reduced right and very poor left heel-to-shin .    RAM is slowed left worse than right  Gait and station: Station requires support.  With bilateral support, she can take just a couple steps.  Romberg is positive.   Reflexes: Deep tendon reflexes are increased in legs, worse on the left.   Marland Kitchen    DIAGNOSTIC DATA (LABS, IMAGING, TESTING) - I  reviewed patient records, labs, notes,  testing and imaging myself where available.  Records from [facility] neurology were reviewed   ASSESSMENT AND PLAN  Multiple sclerosis ([place])  Spastic hemiplegia (HCC)  Spastic gait  Urinary frequency   1.    IV Solu-Medrol 1000 mg 4 days.   Check MRI of the brain and cervical spine to assess the aggressiveness of the MS and also to make sure that there is not a compressive myelopathy. 2.    Continue Pain medications. Refills were provided. We discussed not taking more than the prescribed amount of medication in any day. 3.    Change Copaxone to another DMT. We discussed options.  Her disease course is difficult to characterize. She has had progression from the outset and could be primary progressive MS.   However, there has been some worsening over shorter periods of time regardless, I believe she needs to be on a disease modifying therapy. Therefore, I will have her switch to ocrelizumab.. 4.    She will return to see me for infusion or sooner if she has new or worsening neurologic symptoms.  45 minute face-to-face evaluation with greater than one half of the time counseling and coordinating care about her current neurologic issues related to MS and disease modifying therapies.  Richard A. 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 We have discussed the risks and benefits of ocrelizumab and she would like to proceed with that therapy. She has been tested for hep B surface antigen, hep B surface antibody and Hep B core antibody and these tests are all negative. Additionally, TB testing and chest x-ray rule out tuberculosis.    Therefore, she may proceed with  ocrelizumab once scheduled.  Richard A. [name], MD, PhD Certified in Neurology, [place] [facility], Sleep Medicine, Pain Medicine and Neuroimaging  [facility] [address] [place], [place] [address] [phone]

## 2015-09-17 ENCOUNTER — Telehealth: Payer: Self-pay | Admitting: Neurology

## 2015-09-17 LAB — HEPATIC FUNCTION PANEL
ALK PHOS: 75 IU/L (ref 39–117)
ALT: 14 IU/L (ref 0–32)
AST: 18 IU/L (ref 0–40)
Albumin: 4.4 g/dL (ref 3.5–5.5)
BILIRUBIN, DIRECT: 0.14 mg/dL (ref 0.00–0.40)
Bilirubin Total: 0.5 mg/dL (ref 0.0–1.2)
Total Protein: 7.4 g/dL (ref 6.0–8.5)

## 2015-09-17 LAB — CBC WITH DIFFERENTIAL/PLATELET
Basophils Absolute: 0 10*3/uL (ref 0.0–0.2)
Basos: 0 %
EOS (ABSOLUTE): 0.2 10*3/uL (ref 0.0–0.4)
Eos: 2 %
Hematocrit: 38.1 % (ref 34.0–46.6)
Hemoglobin: 12.7 g/dL (ref 11.1–15.9)
IMMATURE GRANS (ABS): 0 10*3/uL (ref 0.0–0.1)
IMMATURE GRANULOCYTES: 0 %
Lymphocytes Absolute: 2.5 10*3/uL (ref 0.7–3.1)
Lymphs: 25 %
MCH: 30.1 pg (ref 26.6–33.0)
MCHC: 33.3 g/dL (ref 31.5–35.7)
MCV: 90 fL (ref 79–97)
MONOS ABS: 0.5 10*3/uL (ref 0.1–0.9)
Monocytes: 5 %
Neutrophils Absolute: 6.8 10*3/uL (ref 1.4–7.0)
Neutrophils: 68 %
PLATELETS: 352 10*3/uL (ref 150–379)
RBC: 4.22 x10E6/uL (ref 3.77–5.28)
RDW: 13.6 % (ref 12.3–15.4)
WBC: 10 10*3/uL (ref 3.4–10.8)

## 2015-09-17 LAB — HEPATITIS C ANTIBODY: Hep C Virus Ab: <0.1 s/co ratio (ref 0.0–0.9)

## 2015-09-17 LAB — HEPATITIS B CORE ANTIBODY, TOTAL: HEP B C TOTAL AB: NEGATIVE

## 2015-09-17 LAB — HEPATITIS B SURFACE ANTIBODY,QUALITATIVE: Hep B Surface Ab, Qual: NONREACTIVE

## 2015-09-17 NOTE — Telephone Encounter (Signed)
[facility].  Per [facility] suite, they left a message on her phone to call for an infusion appt.  Pt. needs to call [facility] phone # [phone] for appt/fim

## 2015-09-17 NOTE — Telephone Encounter (Signed)
Pt called in about her steroid injection. Pt wants to know how long the needle needs to stay in . Please call and advise

## 2015-09-19 LAB — QUANTIFERON IN TUBE
QFT TB AG MINUS NIL VALUE: 0.01 IU/mL
QUANTIFERON MITOGEN VALUE: 0.14 [IU]/mL
QUANTIFERON NIL VALUE: 0.01 [IU]/mL
QUANTIFERON TB AG VALUE: 0.02 [IU]/mL
QUANTIFERON TB GOLD: UNDETERMINED — AB

## 2015-09-19 LAB — QUANTIFERON TB GOLD ASSAY (BLOOD)

## 2015-09-19 NOTE — Telephone Encounter (Signed)
LMOM that pt. should follow thru with mri that RAS ordered at ov this week.  Other than that, we are waiting on lab results--if they are ok, will start the process of switching her from Copaxone to [facility].  She does not need to return this call unless she has other questions/fim

## 2015-09-19 NOTE — Telephone Encounter (Signed)
Patient is calling back. She says she will be finished with her infusions on Sunday at [facility].  She wants to know what she needs to do next. Please call and advise.

## 2015-09-22 ENCOUNTER — Other Ambulatory Visit: Payer: Self-pay | Admitting: Neurology

## 2015-09-24 ENCOUNTER — Telehealth: Payer: Self-pay | Admitting: *Deleted

## 2015-09-24 DIAGNOSIS — Z79899 Other long term (current) drug therapy: Secondary | ICD-10-CM

## 2015-09-24 DIAGNOSIS — R7612 Nonspecific reaction to cell mediated immunity measurement of gamma interferon antigen response without active tuberculosis: Secondary | ICD-10-CM

## 2015-09-24 DIAGNOSIS — G35 Multiple sclerosis: Secondary | ICD-10-CM

## 2015-09-24 MED ORDER — SULFAMETHOXAZOLE-TRIMETHOPRIM 800-160 MG PO TABS: 1.0000 | ORAL_TABLET | Freq: Two times a day (BID) | ORAL | Status: DC

## 2015-09-24 NOTE — Addendum Note (Signed)
Addended by: France Ravens I on: 09/24/2015 05:10 PM   Modules accepted: Orders

## 2015-09-24 NOTE — Telephone Encounter (Signed)
Patient is calling needing antibiotic for her  Urine.

## 2015-09-24 NOTE — Telephone Encounter (Signed)
I have spoken with Tina Griffith this afternoon and per RAS, advised that TB test was indeterminate.  Before she can start Ocrelizumab, she must have a cxr to r/o prior TB infection.  She verbalized understanding of same, is agreeable.  CXR ordered, requested to be done at [facility] [place]/fim

## 2015-09-24 NOTE — Telephone Encounter (Signed)
I have spoken with Tina Griffith this afternoon.  She c/o strong odor to urine.  Denies more urinary frequency than is normal for her.  Denies dysuria.  Denies change in color.  Denies blood in urine.  Sts. this is typical for her when she has a uti--that often the only change she notices is strong odor.  She is not able to come in for u/a (transportation issues).  She has tolerated Bactrim well in the past.   Bactrim DS, one po bid #14 escribed to [facility] at [place] per her request/fim

## 2015-09-24 NOTE — Telephone Encounter (Signed)
-----   Message from Britt Bottom, MD sent at 09/24/2015  8:54 AM EDT ----- The quantiferon TB test was indeterminate and we would need to get a chest x-ray to make sure she does not have any evidence of prior TB.   She can do this at the [facility] in [facility] since it is closer.  Other labs looked fine.

## 2015-09-29 ENCOUNTER — Ambulatory Visit (HOSPITAL_BASED_OUTPATIENT_CLINIC_OR_DEPARTMENT_OTHER)
Admission: RE | Admit: 2015-09-29 | Discharge: 2015-09-29 | Disposition: A | Discharge status: Home / Self Care | Payer: Medicare Other | Source: Ambulatory Visit | Attending: Neurology | Admitting: Neurology

## 2015-09-29 DIAGNOSIS — R918 Other nonspecific abnormal finding of lung field: Secondary | ICD-10-CM | POA: Diagnosis not present

## 2015-09-29 DIAGNOSIS — G35 Multiple sclerosis: Secondary | ICD-10-CM | POA: Diagnosis not present

## 2015-09-29 DIAGNOSIS — R7612 Nonspecific reaction to cell mediated immunity measurement of gamma interferon antigen response without active tuberculosis: Secondary | ICD-10-CM | POA: Insufficient documentation

## 2015-09-29 DIAGNOSIS — Z79899 Other long term (current) drug therapy: Secondary | ICD-10-CM | POA: Insufficient documentation

## 2015-09-30 ENCOUNTER — Telehealth: Payer: Self-pay | Admitting: *Deleted

## 2015-09-30 ENCOUNTER — Telehealth: Payer: Self-pay | Admitting: Neurology

## 2015-09-30 NOTE — Telephone Encounter (Signed)
-----   Message from Britt Bottom, MD sent at 09/30/2015 12:08 PM EDT ----- No evidence TB on exam. We can go ahead and set up ocrelizumab.

## 2015-09-30 NOTE — Telephone Encounter (Signed)
Pt called request results from Xray.

## 2015-09-30 NOTE — Telephone Encounter (Signed)
I have spoken with Tina Griffith this afternoon, and per RAS, advised CXR was ok--she may start Ocrelizumab.  She is agreeable.  SRF  started and turned into Tina in the infusion suite/fim

## 2015-09-30 NOTE — Telephone Encounter (Signed)
I have spoken with Tina Griffith this afternoon and per RAS, advised that CXR was ok--she can start Ocrevus if she likes.  She verbalized understanding of same; sts. she would like to proceed with getting approval for Ocrevus.  SRF started and turned in to [place] in the infusion suite/fim

## 2015-10-08 ENCOUNTER — Telehealth: Payer: Self-pay | Admitting: *Deleted

## 2015-10-08 NOTE — Telephone Encounter (Signed)
I have spoken with Tina Griffith today and advised that I have passed her message along to [name] in the infusion suite--that  [name] will call her as soon as all ins. approvals have been received and med. has been ordered and received.  I have reiterated that it is taking b/t 3-5 weeks from the time the srf is signed, to get pt.'s cleared and ready for infusion.  She verbalized understanding of same/fim

## 2015-10-08 NOTE — Telephone Encounter (Signed)
Message For: [facility]                  Taken 17-MAY-17 at  8:33AM by JPO ------------------------------------------------------------ Tina Griffith             CID [id number]  Patient SAME                 Pt's Dr [name]        Area Code 336 Phone# [phone]                                                                               Disp:Y/N N If Y = C/B If No Response In 69minutes  ============================================================

## 2015-10-09 ENCOUNTER — Telehealth: Payer: Self-pay | Admitting: *Deleted

## 2015-10-09 NOTE — Telephone Encounter (Signed)
[place] [id number] SAME SATER [phone] * [phone] NEEDS TO SPEAK TO TINA IN INFUSION/HAS INS APPROVED HER FOR THE MEDICINE N

## 2015-10-09 NOTE — Telephone Encounter (Signed)
------------------------------------------------------------   Tina Griffith             CID [id number]  Patient SAME                 Pt's Dr [name]        Area Code 336 Phone# [phone] * DOB 12/21/2060    RE LOOKING TO SPEAK WITH TINA, TO SEE IF HER         INSURANCE COVERS HER NEW MEDICATION                  Disp:Y/N N If Y = C/B If No Response In 73minutes =========

## 2015-10-14 ENCOUNTER — Telehealth: Payer: Self-pay | Admitting: *Deleted

## 2015-10-14 NOTE — Telephone Encounter (Signed)
I have spoken with Tina Griffith this afternoon and advised that [name] will call her as soon as she is able to approve Ocrelizumab/fim

## 2015-10-14 NOTE — Telephone Encounter (Signed)
Message For: OFFICE               Taken 23-MAY-17 at 11:04AM by RLR ------------------------------------------------------------ Tina Griffith             CID [id number]  Patient SAME                 Pt's Dr [name]        Area Code 336 Phone# [phone]    RE NEEDS TO SPEAK W/ NURSE-SHE NEEDS RX REFILL       HAS INSURANCE APPROVED IT                            Disp:Y/N N If Y = C/B If No Response In 33minutes ============================================================

## 2015-10-21 NOTE — Telephone Encounter (Signed)
Message printed and given to Tina in the infusion suite/fim 

## 2015-10-21 NOTE — Telephone Encounter (Signed)
Jul[place] [phone] called regarding questions on enrollment forms they received from our office, please refer to case# [id number] when calling.

## 2015-10-22 ENCOUNTER — Telehealth: Payer: Self-pay | Admitting: Neurology

## 2015-10-22 MED ORDER — HYDROCODONE-ACETAMINOPHEN 10-325 MG PO TABS: 1.0000 | ORAL_TABLET | Freq: Four times a day (QID) | ORAL | Status: DC | PRN

## 2015-10-22 NOTE — Telephone Encounter (Signed)
Hydrocodone rx. up front [facility]/fim 

## 2015-10-22 NOTE — Telephone Encounter (Signed)
Patient requesting refill of  HYDROcodone-acetaminophen (NORCO) 10-325 MG tablet . ° ° °

## 2015-10-22 NOTE — Telephone Encounter (Signed)
Rx. awaiting RAS sig/fim 

## 2015-10-23 ENCOUNTER — Telehealth: Payer: Self-pay | Admitting: *Deleted

## 2015-10-23 NOTE — Telephone Encounter (Signed)
Message For: OFFICE               Taken  1-JUN-17 at 12:10PM by JWT ------------------------------------------------------------ Marnee Guarneri             CID [id number]  Patient SAME                 Pt's Dr [name]        Area Code 336 Phone# [phone] PICKED UP?                                                                     Disp:Y/N N If Y = C/B If No Response In 59minutes ============================================================ No answer.

## 2015-10-24 ENCOUNTER — Telehealth: Payer: Self-pay | Admitting: Neurology

## 2015-10-24 ENCOUNTER — Telehealth: Payer: Self-pay | Admitting: *Deleted

## 2015-10-24 MED ORDER — MORPHINE SULFATE 30 MG PO TABS: 30.0000 mg | ORAL_TABLET | Freq: Three times a day (TID) | ORAL | Status: DC

## 2015-10-24 NOTE — Telephone Encounter (Signed)
Rx. printed, awaiting RAS sig, then will place up front [facility].  Earnie Larsson will pick rx. up next week/fim

## 2015-10-24 NOTE — Telephone Encounter (Signed)
Pt called requesting to speak with Faith. She did not go into detail. Said she would be at the clinic around 11 to pick up RX and would like to speak with Faith then. Operator explained she may have to wait, she understood

## 2015-10-24 NOTE — Telephone Encounter (Signed)
I have spoken with Tina Griffith this morning.  She c/o increased difficulty walking, fatigue onset last night.  Per Tina Griffith in the infusion suite. it appears final Ocrevus authorizations are pending, so hopefully first infusion will be soon.  Per RAS, ok for IV SM 1gm today.  Order given to [place].  Tina Griffith will be here about 11am/fim

## 2015-10-28 ENCOUNTER — Telehealth: Payer: Self-pay | Admitting: Neurology

## 2015-10-28 DIAGNOSIS — R8290 Unspecified abnormal findings in urine: Secondary | ICD-10-CM

## 2015-10-28 NOTE — Telephone Encounter (Signed)
Pt called sts her urine is "smelling horrible" from the infusion. He said RN told her to call clinic to get antibiotic. Please call to [facility] at [facility]

## 2015-10-28 NOTE — Telephone Encounter (Signed)
I have spoken with Tina Griffith this afternoon.  She c/o continued foul odor to urine.  Just completed course of Septra DS less than a month ago.  Advised pt. she should have u/a with c&s to confirm u/a, and see which antiobiotic will best treat it if she does have a uti.  She is agreeable.  Ordered in EPIC and she will go to [place] in [facility] tomorrow/fim

## 2015-10-29 ENCOUNTER — Other Ambulatory Visit: Payer: Self-pay | Admitting: Neurology

## 2015-11-10 ENCOUNTER — Telehealth: Payer: Self-pay | Admitting: Neurology

## 2015-11-10 NOTE — Telephone Encounter (Signed)
I have spoken with [place].  She sts. ins. has approved Tysabri.  Will pass this message along to [facility] in the infusion suite/fim

## 2015-11-10 NOTE — Telephone Encounter (Signed)
Patient called regarding appointment for Tysabri, states she hasn't heard from anyone, requests to speak to [facility]. Please call [phone].

## 2015-11-13 ENCOUNTER — Telehealth: Payer: Self-pay | Admitting: Neurology

## 2015-11-13 NOTE — Telephone Encounter (Signed)
Patient is calling and states she is in a lot of pain and needs a steroid drip.  Please call.

## 2015-11-14 NOTE — Telephone Encounter (Signed)
Patient called back to check status of message left yesterday regarding appointment for morphine drip today. Please call [phone].

## 2015-11-14 NOTE — Telephone Encounter (Signed)
Noted.  Pt. was seen in the infusion suite today and received SoluMedrol 1gm IV.  She is sched. for Ocrelizumab #1 next Thursday/fim

## 2015-11-14 NOTE — Telephone Encounter (Signed)
I have spoken with Tina Griffith this morning.  She sts. balance is worse and requests SM.  OK per RAS.  She will be here by 1030--for SM 1gm IV (one day).  [name] is aware/fim

## 2015-11-14 NOTE — Telephone Encounter (Signed)
FYI, patient called 6/23 10:32:27am to advise, she will be here by 10:45am for infusion today.

## 2015-11-20 ENCOUNTER — Telehealth: Payer: Self-pay | Admitting: *Deleted

## 2015-11-20 MED ORDER — MORPHINE SULFATE 30 MG PO TABS: 30.0000 mg | ORAL_TABLET | Freq: Three times a day (TID) | ORAL | Status: DC

## 2015-11-20 MED ORDER — TRAMADOL HCL 50 MG PO TABS: ORAL_TABLET | ORAL | Status: DC

## 2015-11-20 MED ORDER — HYDROCODONE-ACETAMINOPHEN 10-325 MG PO TABS: 1.0000 | ORAL_TABLET | Freq: Four times a day (QID) | ORAL | Status: DC | PRN

## 2015-11-20 NOTE — Telephone Encounter (Signed)
Hydrocodone, Morphone and Tramadol rx's provided at infusion appt/fim

## 2015-11-21 ENCOUNTER — Ambulatory Visit: Payer: Medicare Other | Admitting: Neurology

## 2015-11-21 ENCOUNTER — Telehealth: Payer: Self-pay | Admitting: Neurology

## 2015-12-17 ENCOUNTER — Encounter: Payer: Self-pay | Admitting: Neurology

## 2015-12-17 ENCOUNTER — Ambulatory Visit (INDEPENDENT_AMBULATORY_CARE_PROVIDER_SITE_OTHER): Payer: Medicare Other | Admitting: Neurology

## 2015-12-17 VITALS — BP 130/80 | Wt 165.0 lb

## 2015-12-17 DIAGNOSIS — R261 Paralytic gait: Secondary | ICD-10-CM

## 2015-12-17 DIAGNOSIS — G35 Multiple sclerosis: Secondary | ICD-10-CM

## 2015-12-17 DIAGNOSIS — R350 Frequency of micturition: Secondary | ICD-10-CM

## 2015-12-17 DIAGNOSIS — G8110 Spastic hemiplegia affecting unspecified side: Secondary | ICD-10-CM

## 2015-12-17 DIAGNOSIS — Z79899 Other long term (current) drug therapy: Secondary | ICD-10-CM | POA: Diagnosis not present

## 2015-12-17 DIAGNOSIS — M79605 Pain in left leg: Secondary | ICD-10-CM | POA: Diagnosis not present

## 2015-12-17 MED ORDER — HYDROCODONE-ACETAMINOPHEN 10-325 MG PO TABS: 1.0000 | ORAL_TABLET | Freq: Four times a day (QID) | ORAL | 0 refills | Status: DC | PRN

## 2015-12-17 MED ORDER — MORPHINE SULFATE 30 MG PO TABS: 30.0000 mg | ORAL_TABLET | Freq: Three times a day (TID) | ORAL | 0 refills | Status: DC

## 2015-12-17 MED ORDER — TRAMADOL HCL 50 MG PO TABS: ORAL_TABLET | ORAL | 3 refills | Status: DC

## 2015-12-17 NOTE — Telephone Encounter (Signed)
error 

## 2015-12-17 NOTE — Progress Notes (Signed)
[facility]  PATIENT: Tina Griffith DOB: 19-Jul-1960  REFERRING DOCTOR OR PCP:  None currently SOURCE: patient  _________________________________   HISTORICAL  CHIEF COMPLAINT:  Chief Complaint  Patient presents with  . Multiple Sclerosis    Has had her first 2 Ocrelizumab infusions (300mg  each). Sts. she feels "great." Sts. walking is about the same./fim    HISTORY OF PRESENT ILLNESS:  Tina Griffith is a 55 year old woman with multiple sclerosis diagnosed in July 2005. Her main problems are left leg weakness, spasticity and pain and poor gait.  Over the past year, she has had more trouble with the right leg and arm.   Since last week, she reports more weakness in her legs and clumsiness in her hands.   She is falling more. She also notes that vision is worse.     Gait/strength/sensation/pain: She is having more trouble standing and more difficulty using walker.   She has left > right leg weakness and spasticity.  She was using a walker for short distance but now can only tak ea few steps.   She also has mild spasticity and decreased function in the left arm x several years but more recent worsening of the right arm.    Tizanidine 8 mg 3 times a day helps a little bit. She noted only a mild benefit with the higher dose.   She takes Ampyra and noted an improvement in her gait when we started.   She tolerates it well.  Pain:   Pain is mostly in the left leg.   She is on morphine IR 30 mg 3 times a day and hydrocodone 10/325 up to 3-4 times a day and tramadol tid.    With these medicines she notes benefit with less pain though she reports pain is still bad at times.   . She tolerates the medicine fairly well with the exception constipation. She takes Linzess plus MiraLAX with a lot of benefit for the constipation.   Bladder/bowel: She has notes urinary frequency, helped by oxybutynin tid.   She has no recent UTI . She denies any significant hesitancy and feels that she  usually empties her bladder well.  She has had constipation that is likely more due to the pain medicines than to the MS. Constipation improved a lo with Linzess plus MiraLAX.  Vision: She notes some blurry vision, better with glasses.      Fatigue/sleep: She feels that her energy level is much worse the past month.    She gets more tired as the say and was better when she was sleeping better.    She sleeps poorly many nights, waking up every couple hours.    Mood/cognition: She denies depression but has some anxiety. Mood is better with Effexor and buspirone.   She denies any significant cognitive problems and notes no problems with short-term memory. She feels her thinking skills are unchanged.  MS History:   She presented with left leg spasticity and weakness and an MRI was consistent with multiple sclerosis. Initially, she was placed on Betaseron but had some exacerbations with worsening leg function. She then switched to Copaxone. She has been tolerating Copaxone pretty well and recently switched to the 40 mg every other day dose. She denies any recent exacerbation.    MRI of the brain 05/09/2013 showed prominent white matter changes in the right cerebellum and the white matter of both hemispheres in a pattern and configuration consistent with multiple sclerosis. There were no enhancing lesions. When  the study was compared to a prior MRI dated 01/11/2011, there was no new or progressive findings.    REVIEW OF SYSTEMS: Constitutional: No fevers, chills, sweats, or change in appetite.   Notes fatigue Eyes: No visual changes, double vision, eye pain Ear, nose and throat: No hearing loss, ear pain, nasal congestion, sore throat Cardiovascular: No chest pain, palpitations Respiratory: No shortness of breath at rest or with exertion.   No wheezes GastrointestinaI: Notes constipation.   No pain Genitourinary: Notes urinary frequency. Musculoskeletal: No neck pain, some back pain, lots of left >  right leg pain Integumentary: No rash, pruritus, skin lesions Neurological: as above Psychiatric: No depression at this time.  No anxiety Endocrine: No palpitations, diaphoresis, change in appetite, change in weigh or increased thirst Hematologic/Lymphatic: No anemia, purpura, petechiae. Allergic/Immunologic: No itchy/runny eyes, nasal congestion, recent allergic reactions, rashes  ALLERGIES: Allergies  Allergen Reactions  . No Known Allergies     HOME MEDICATIONS:  Current Outpatient Prescriptions:  .  baclofen (LIORESAL) 10 MG tablet, Take 1 tablet (10 mg total) by mouth 4 (four) times daily., Disp: 120 each, Rfl: 5 .  citalopram (CELEXA) 20 MG tablet, TAKE 1 TABLET DAILY, Disp: 30 tablet, Rfl: 6 .  dalfampridine 10 MG TB12, Take 1 tablet (10 mg total) by mouth 2 (two) times daily., Disp: 60 tablet, Rfl: 11 .  ENSURE (ENSURE), Frequency:   Dosage:0     Instructions:  Note:One can by mouth twice a day, Disp: , Rfl:  .  furosemide (LASIX) 40 MG tablet, Take 40 mg by mouth daily., Disp: , Rfl:  .  gabapentin (NEURONTIN) 300 MG capsule, Take 1 capsule (300 mg total) by mouth 3 (three) times daily., Disp: 90 capsule, Rfl: 11 .  hydrochlorothiazide (HYDRODIURIL) 25 MG tablet, Take 25 mg by mouth daily., Disp: , Rfl:  .  HYDROcodone-acetaminophen (NORCO) 10-325 MG tablet, Take 1 tablet by mouth every 6 (six) hours as needed., Disp: 180 tablet, Rfl: 0 .  Lubiprostone (AMITIZA PO), Take by mouth., Disp: , Rfl:  .  morphine (MSIR) 30 MG tablet, Take 1 tablet (30 mg total) by mouth 3 (three) times daily., Disp: 90 tablet, Rfl: 0 .  nicotine (RA NICOTINE) 14 mg/24hr patch, Place onto the skin. Reported on 08/15/2015, Disp: , Rfl:  .  Ocrelizumab 300 MG/10ML SOLN, Inject 600 mg into the vein every 6 (six) months., Disp: , Rfl:  .  oxybutynin (DITROPAN) 5 MG tablet, TAKE 2 TABLETS 3 TIMES A DAY. IF THIS CAUSES DRY MOUTH DECREASE TO 1 TABLET 3 TIMES A DAY, Disp: 180 tablet, Rfl: 11 .  polyethylene  glycol (MIRALAX / GLYCOLAX) packet, Take 17 g by mouth daily., Disp: 30 each, Rfl: 5 .  tiZANidine (ZANAFLEX) 4 MG tablet, Take 2 tablets (8 mg total) by mouth 3 (three) times daily., Disp: 180 tablet, Rfl: 11 .  traMADol (ULTRAM) 50 MG tablet, Take 2 tablets 3 times daily as needed for pain., Disp: 180 tablet, Rfl: 3 .  venlafaxine XR (EFFEXOR-XR) 37.5 MG 24 hr capsule, , Disp: , Rfl:  .  fluticasone (FLONASE) 50 MCG/ACT nasal spray, ONE TO TWO SPRAYS EACH NOSTRIL EVERY MORNING AS NEEDED, Disp: , Rfl:  .  Linaclotide (LINZESS) 145 MCG CAPS capsule, Take by mouth., Disp: , Rfl:  .  omeprazole (PRILOSEC) 20 MG capsule, TAKE 1 CAPSULE DAILY 30 MINUTES BEFORE BREAKFAST, Disp: , Rfl:  .  potassium chloride (K-DUR) 10 MEQ tablet, Take by mouth., Disp: , Rfl:  .  sulfamethoxazole-trimethoprim (BACTRIM DS,SEPTRA DS) 800-160 MG tablet, Take 1 tablet by mouth 2 (two) times daily. (Patient not taking: Reported on 12/17/2015), Disp: 14 tablet, Rfl: 0  PAST MEDICAL HISTORY: Past Medical History:  Diagnosis Date  . Constipation   . Hypertension   . Movement disorder   . MS (multiple sclerosis) ([place])   . Vision abnormalities     PAST SURGICAL HISTORY: Past Surgical History:  Procedure Laterality Date  . COLOSTOMY    . COLOSTOMY REVERSAL    . HERNIA REPAIR    . KNEE SURGERY      FAMILY HISTORY: Family History  Problem Relation Age of Onset  . Lung cancer Mother   . Stroke Mother   . Diabetes Father     SOCIAL HISTORY:  Social History   Social History  . Marital status: Single    Spouse name: N/A  . Number of children: N/A  . Years of education: N/A   Occupational History  . Not on file.   Social History Main Topics  . Smoking status: Current Every Day Smoker    Packs/day: 1.00    Types: Cigarettes  . Smokeless tobacco: Not on file  . Alcohol use No  . Drug use: No  . Sexual activity: Not on file   Other Topics Concern  . Not on file   Social History Narrative  . No  narrative on file     PHYSICAL EXAM  Vitals:   12/17/15 1149  BP: 130/80  Weight: 165 lb (74.8 kg)    Body mass index is 26.63 kg/m.   General: The patient is well-developed and well-nourished and in no acute distress  Musculoskeletal:  Back is nontender  Neurologic Exam  Mental status: The patient is alert and oriented x 3 at the time of the examination. The patient has apparent normal recent and remote memory, with an apparently normal attention span and concentration ability.   Speech is normal.  Cranial nerves: Extraocular movements are full.   1+ right APD  Facial symmetry is present. There is good facial sensation to soft touch bilaterally.Facial strength is normal.  Trapezius and sternocleidomastoid strength is normal. No dysarthria is noted.  The tongue is midline, and the patient has symmetric elevation of the soft palate. No obvious hearing deficits are noted.  Motor:  Muscle bulk is normal.   Tone is increased in legs > arms, left much worse than right. Strength is 5/5 right arm and right leg, 3 to 4-/5 in left leg and 4+/5 in left arm   Sensory: Sensory testing is intact to pinprick, soft touch and vibration sensation in all 4 extremities.  Coordination: She has a mild intention tremor in arms and legs.   Cerebellar testing reveals ,mildly reduced  finger-nose-finger and reduced right and very poor left heel-to-shin .    RAM is slowed left worse than right  Gait and station: Station requires support.  With bilateral support, she can take a few steps.  Romberg is positive.   Reflexes: Deep tendon reflexes are increased in legs, worse on the left.   Marland Kitchen    DIAGNOSTIC DATA (LABS, IMAGING, TESTING) - I reviewed patient records, labs, notes, testing and imaging myself where available.  Records from [facility] neurology were reviewed   ASSESSMENT AND PLAN  Multiple sclerosis ([place]) - Plan: MR Brain W Wo Contrast, MR Cervical Spine W Wo Contrast  Hemiplegia, spastic  (HCC)  Spastic gait - Plan: MR Brain W Wo Contrast, MR Cervical Spine W  Wo Contrast  Leg pain, left  Urinary frequency  Other long term (current) drug therapy    1.   Continue ocrelizumab every 6 months 2.    Continue Pain medications. Refills were provided. We discussed not taking more than the prescribed amount of medication in any day. 3.    Change Copaxone to another DMT. We discussed options.  Her disease course is difficult to characterize. She has had progression from the outset and could be primary progressive MS.   However, there has been some worsening over shorter periods of time regardless, I believe she needs to be on a disease modifying therapy. Therefore, I will have her switch to ocrelizumab.. 4.    She will return to see me for infusion or sooner if she has new or worsening neurologic symptoms.  45 minute face-to-face evaluation with greater than one half of the time counseling and coordinating care about her current neurologic issues related to MS and disease modifying therapies.  Richard A. [name], MD, PhD [id number], [id number] PM Certified in Neurology, Clinical Neurophysiology, Sleep Medicine, Pain Medicine and Neuroimaging  [facility] [address] [place], [place] [address] [phone]  --------------------------------------------

## 2015-12-18 ENCOUNTER — Telehealth: Payer: Self-pay | Admitting: Neurology

## 2015-12-18 NOTE — Telephone Encounter (Signed)
LMTC./fim 

## 2015-12-18 NOTE — Telephone Encounter (Signed)
Patient called requesting to speak with nurse Faith, states "her urine stinks". Please call [phone].

## 2015-12-19 ENCOUNTER — Other Ambulatory Visit: Payer: Self-pay | Admitting: *Deleted

## 2015-12-19 DIAGNOSIS — R8290 Unspecified abnormal findings in urine: Secondary | ICD-10-CM

## 2016-01-01 ENCOUNTER — Ambulatory Visit: Payer: Self-pay | Admitting: Neurology

## 2016-01-01 ENCOUNTER — Telehealth: Payer: Self-pay

## 2016-01-01 NOTE — Telephone Encounter (Signed)
Patient called in to say that she cannot come to appt today. She is still in the hospital.

## 2016-01-06 ENCOUNTER — Encounter: Payer: Self-pay | Admitting: Neurology

## 2016-01-12 ENCOUNTER — Telehealth: Payer: Self-pay | Admitting: *Deleted

## 2016-01-12 NOTE — Telephone Encounter (Signed)
Received a phone call from pt's cousin, Tina Griffith.  She wanted Dr. [name] to know that Tina Griffith is back in the hospital [facility]).  Apparently yesterday at church, she was not able to stay awake, had some speech/cognitive disturbances.  Later in the day, another cousin had difficulty waking her up, so she was sent to [facility] for eval, which was negative for stroke (per [name])  [name] will call me once [name] has been d/c from the hospital so I can sched. a hospital f/u with RAS.Hilton Cork

## 2016-01-15 ENCOUNTER — Ambulatory Visit: Payer: Self-pay | Admitting: Neurology

## 2016-01-16 ENCOUNTER — Encounter: Payer: Self-pay | Admitting: Neurology

## 2016-01-16 ENCOUNTER — Ambulatory Visit: Payer: Self-pay | Admitting: Neurology

## 2016-01-16 ENCOUNTER — Ambulatory Visit (INDEPENDENT_AMBULATORY_CARE_PROVIDER_SITE_OTHER): Payer: Medicare Other | Admitting: Neurology

## 2016-01-16 VITALS — BP 108/70 | HR 64 | Resp 20 | Ht 66.0 in | Wt 162.0 lb

## 2016-01-16 DIAGNOSIS — Z79899 Other long term (current) drug therapy: Secondary | ICD-10-CM | POA: Diagnosis not present

## 2016-01-16 DIAGNOSIS — M79605 Pain in left leg: Secondary | ICD-10-CM | POA: Diagnosis not present

## 2016-01-16 DIAGNOSIS — Z79891 Long term (current) use of opiate analgesic: Secondary | ICD-10-CM

## 2016-01-16 DIAGNOSIS — G35 Multiple sclerosis: Secondary | ICD-10-CM

## 2016-01-16 DIAGNOSIS — G8110 Spastic hemiplegia affecting unspecified side: Secondary | ICD-10-CM

## 2016-01-16 DIAGNOSIS — R261 Paralytic gait: Secondary | ICD-10-CM | POA: Diagnosis not present

## 2016-01-16 MED ORDER — NALOXONE HCL 0.4 MG/0.4ML IJ SOAJ: INTRAMUSCULAR | 2 refills | Status: AC

## 2016-01-16 MED ORDER — TRAMADOL HCL 50 MG PO TABS: ORAL_TABLET | ORAL | 0 refills | Status: DC

## 2016-01-16 MED ORDER — HYDROCODONE-ACETAMINOPHEN 10-325 MG PO TABS: 1.0000 | ORAL_TABLET | Freq: Four times a day (QID) | ORAL | 0 refills | Status: DC | PRN

## 2016-01-16 MED ORDER — MORPHINE SULFATE 30 MG PO TABS: 30.0000 mg | ORAL_TABLET | Freq: Two times a day (BID) | ORAL | 0 refills | Status: DC

## 2016-01-16 NOTE — Progress Notes (Signed)
[facility]  PATIENT: Tina Griffith DOB: 04-Sep-1960  REFERRING DOCTOR OR PCP:  None currently SOURCE: patient  _________________________________   HISTORICAL  CHIEF COMPLAINT:  Chief Complaint  Patient presents with  . Multiple Sclerosis    Has had part A and part B of first Ocrelizumab infusion.  Has been admitted to [facility] twice in the last few weeks, due to increased difficulty walking, and drowsiness caused by Baclofen.  She is receiving home PT 3 times per week.  She is in a w/c today./fim  . Hospitalization Follow-up    HISTORY OF PRESENT ILLNESS:  Tina Griffith is a 55 year old woman with multiple sclerosis diagnosed in July 2005. She has had a more primary progressive course. She started ocrelizumab and completed the first split dose.   She had 2 recent hospitalizations.   The first one was when her family was unable to wake her up from a nap.    She notes taking baclofen right before her nap.  he is also on opiate therapy. The second hospitalization was associated with an Escherichia coli UTI. She was acting more sluggish and confused prompting the hospital admission and evaluation. He was just discharged 2 days ago. She is on antibiotics.  We had a long discussion about her medications. She has been long-term opiate therapy to reduce her dose 1-2 years ago and has not shown drug seeking behavior. The recent episode, however, I feel we need to reduce her opiates more. I will change her to MSIR 30 mg twice a day (from 3 times a day), she will continue hydrocodone 4 times a day and I will reduce the tramadol from 6 pills to 3 pills a day. I gave a prescription for a Narcan pen.   Her main problems form MS are left leg weakness, spasticity and pain and poor gait.  Over the past year, she has had more trouble with the right leg and arm.   These issues are similar to her last visit.   She can only walk very short distances with a walker and spends most of her  time in a wheelchair. The main problem is left leg weakness and spasticity but the right leg has mild weakness. Tizanidine has helped her spasticity more than the baclofen and the baclofen was discontinued in the hospital.  She takes Ampyra and noted an improvement in her gait when we started.   She tolerates it well.     Records from her recent hospital stay were reviewed. I personally looked at the MRI there are no new MS lesions. The EEG report mentions some slowing and a couple spikes. However, she has not had any actual seizures and this EEG was performed while she was still having cognitive changes.  Fatigue/sleep: She feels that her energy level is worse this year.     She sleeps poorly many nights, waking up every couple hours.    Mood/cognition: She denies depression but has some anxiety. Mood is better with Effexor and buspirone.   She denies any significant cognitive problems and notes no problems with short-term memory. She feels her thinking skills are unchanged.  MS History:   She presented with left leg spasticity and weakness and an MRI was consistent with multiple sclerosis. Initially, she was placed on Betaseron but had some exacerbations with worsening leg function. She then switched to Copaxone. She has been tolerating Copaxone pretty well and recently switched to the 40 mg every other day dose. She denies any recent exacerbation.  MRI of the brain 05/09/2013 showed prominent white matter changes in the right cerebellum and the white matter of both hemispheres in a pattern and configuration consistent with multiple sclerosis. There were no enhancing lesions. When the study was compared to a prior MRI dated 01/11/2011, there was no new or progressive findings.    REVIEW OF SYSTEMS: Constitutional: No fevers, chills, sweats, or change in appetite.   Notes fatigue Eyes: No visual changes, double vision, eye pain Ear, nose and throat: No hearing loss, ear pain, nasal congestion, sore  throat Cardiovascular: No chest pain, palpitations Respiratory: No shortness of breath at rest or with exertion.   No wheezes GastrointestinaI: Notes constipation.   No pain Genitourinary: Notes urinary frequency. Musculoskeletal: No neck pain, some back pain, lots of left > right leg pain Integumentary: No rash, pruritus, skin lesions Neurological: as above Psychiatric: No depression at this time.  No anxiety Endocrine: No palpitations, diaphoresis, change in appetite, change in weigh or increased thirst Hematologic/Lymphatic: No anemia, purpura, petechiae. Allergic/Immunologic: No itchy/runny eyes, nasal congestion, recent allergic reactions, rashes  ALLERGIES: Allergies  Allergen Reactions  . No Known Allergies     HOME MEDICATIONS:  Current Outpatient Prescriptions:  .  cefdinir (OMNICEF) 300 MG capsule, Take 300 mg by mouth., Disp: , Rfl:  .  dalfampridine 10 MG TB12, Take 1 tablet (10 mg total) by mouth 2 (two) times daily., Disp: 60 tablet, Rfl: 11 .  ENSURE (ENSURE), Frequency:   Dosage:0     Instructions:  Note:One can by mouth twice a day, Disp: , Rfl:  .  furosemide (LASIX) 40 MG tablet, Take 40 mg by mouth daily., Disp: , Rfl:  .  gabapentin (NEURONTIN) 300 MG capsule, Take 1 capsule (300 mg total) by mouth 3 (three) times daily., Disp: 90 capsule, Rfl: 11 .  hydrochlorothiazide (HYDRODIURIL) 25 MG tablet, Take 25 mg by mouth daily., Disp: , Rfl:  .  HYDROcodone-acetaminophen (NORCO) 10-325 MG tablet, Take 1 tablet by mouth every 6 (six) hours as needed., Disp: 180 tablet, Rfl: 0 .  Lubiprostone (AMITIZA PO), Take by mouth., Disp: , Rfl:  .  morphine (MSIR) 30 MG tablet, Take 1 tablet (30 mg total) by mouth 3 (three) times daily., Disp: 90 tablet, Rfl: 0 .  nicotine (RA NICOTINE) 14 mg/24hr patch, Place onto the skin. Reported on 08/15/2015, Disp: , Rfl:  .  Ocrelizumab 300 MG/10ML SOLN, Inject 600 mg into the vein every 6 (six) months., Disp: , Rfl:  .  oxybutynin  (DITROPAN) 5 MG tablet, TAKE 2 TABLETS 3 TIMES A DAY. IF THIS CAUSES DRY MOUTH DECREASE TO 1 TABLET 3 TIMES A DAY, Disp: 180 tablet, Rfl: 11 .  polyethylene glycol (MIRALAX / GLYCOLAX) packet, Take 17 g by mouth daily., Disp: 30 each, Rfl: 5 .  tiZANidine (ZANAFLEX) 4 MG tablet, Take 2 tablets (8 mg total) by mouth 3 (three) times daily., Disp: 180 tablet, Rfl: 11 .  traMADol (ULTRAM) 50 MG tablet, Take 2 tablets 3 times daily as needed for pain., Disp: 180 tablet, Rfl: 3 .  baclofen (LIORESAL) 10 MG tablet, Take 1 tablet (10 mg total) by mouth 4 (four) times daily. (Patient not taking: Reported on 01/16/2016), Disp: 120 each, Rfl: 5 .  citalopram (CELEXA) 20 MG tablet, TAKE 1 TABLET DAILY (Patient not taking: Reported on 01/16/2016), Disp: 30 tablet, Rfl: 6 .  fluticasone (FLONASE) 50 MCG/ACT nasal spray, ONE TO TWO SPRAYS EACH NOSTRIL EVERY MORNING AS NEEDED, Disp: , Rfl:  .  Linaclotide (LINZESS) 145 MCG CAPS capsule, Take by mouth., Disp: , Rfl:  .  omeprazole (PRILOSEC) 20 MG capsule, TAKE 1 CAPSULE DAILY 30 MINUTES BEFORE BREAKFAST, Disp: , Rfl:  .  potassium chloride (K-DUR) 10 MEQ tablet, Take by mouth., Disp: , Rfl:  .  sulfamethoxazole-trimethoprim (BACTRIM DS,SEPTRA DS) 800-160 MG tablet, Take 1 tablet by mouth 2 (two) times daily. (Patient not taking: Reported on 01/16/2016), Disp: 14 tablet, Rfl: 0 .  venlafaxine XR (EFFEXOR-XR) 37.5 MG 24 hr capsule, , Disp: , Rfl:   PAST MEDICAL HISTORY: Past Medical History:  Diagnosis Date  . Constipation   . Hypertension   . Movement disorder   . MS (multiple sclerosis) ([place])   . Vision abnormalities     PAST SURGICAL HISTORY: Past Surgical History:  Procedure Laterality Date  . COLOSTOMY    . COLOSTOMY REVERSAL    . HERNIA REPAIR    . KNEE SURGERY      FAMILY HISTORY: Family History  Problem Relation Age of Onset  . Lung cancer Mother   . Stroke Mother   . Diabetes Father     SOCIAL HISTORY:  Social History   Social History   . Marital status: Single    Spouse name: N/A  . Number of children: N/A  . Years of education: N/A   Occupational History  . Not on file.   Social History Main Topics  . Smoking status: Current Every Day Smoker    Packs/day: 1.00    Types: Cigarettes  . Smokeless tobacco: Not on file  . Alcohol use No  . Drug use: No  . Sexual activity: Not on file   Other Topics Concern  . Not on file   Social History Narrative  . No narrative on file     PHYSICAL EXAM  Vitals:   01/16/16 0948  BP: 108/70  Pulse: 64  Resp: 20  Weight: 162 lb (73.5 kg)  Height: 5\' 6"  (1.676 m)    Body mass index is 26.15 kg/m.   General: The patient is well-developed and well-nourished and in no acute distress  Musculoskeletal:  Back is nontender  Neurologic Exam  Mental status: The patient is alert and oriented x 3 at the time of the examination. The patient has apparent normal recent and remote memory, with an apparently normal attention span and concentration ability.   Speech is normal.  Cranial nerves: Extraocular movements are full.   1+ right APD  Facial symmetry is present. There is good facial sensation to soft touch bilaterally.Facial strength is normal.  Trapezius and sternocleidomastoid strength is normal. No dysarthria is noted.  The tongue is midline, and the patient has symmetric elevation of the soft palate. No obvious hearing deficits are noted.  Motor:  Muscle bulk is normal.   Tone is increased in legs > arms, left much worse than right. Strength is 5/5 right arm and right leg, 3 to 4-/5 in left leg and 4+/5 in left arm   Sensory: Sensory testing is intact to pinprick, soft touch and vibration sensation in all 4 extremities.  Coordination: She has a mild intention tremor in arms and legs.   Cerebellar testing reveals ,mildly reduced  finger-nose-finger and reduced right and very poor left heel-to-shin .    RAM is slowed left worse than right  Gait and station: Station  requires support.  With bilateral support, she can take a few steps.  Romberg is positive.   Reflexes: Deep tendon reflexes are increased in  legs, worse on the left.   Marland Kitchen    DIAGNOSTIC DATA (LABS, IMAGING, TESTING) - I reviewed patient records, labs, notes, testing and imaging myself where available.  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   Continue ocrelizumab every 6 months 2.    Continue Pain medications.   I am lowering the dose of MSIR and tramadol.. Refills were provided. A prescription for Narcan pan was provided.   We had a long conversation about her opiate use and the fact that it likely contributed to some of her recent hospitalizations. 3.    She will return to see me in 4 months or as needed if she has new or worsening neurologic symptoms.  45 minute face-to-face evaluation with greater than one half of the time counseling and coordinating care about her current neurologic issues related to MS and opiate therapy.Nanine Means. [name], MD, PhD [id number], [id number] AM Certified in Neurology, Clinical Neurophysiology, Sleep Medicine, Pain Medicine and Neuroimaging  [facility] [address], [place], [place] [address] [phone]  --------------------------------------------

## 2016-01-28 ENCOUNTER — Encounter: Payer: Self-pay | Admitting: *Deleted

## 2016-01-28 ENCOUNTER — Telehealth: Payer: Self-pay | Admitting: Neurology

## 2016-01-28 NOTE — Telephone Encounter (Signed)
Letter up front [facility].  Pt. aware/fim

## 2016-01-28 NOTE — Telephone Encounter (Signed)
Patient is calling stating she needs a letter saying that she cannot be left alone.at any time. Please call patient and advise when ready to pick up.

## 2016-02-24 ENCOUNTER — Telehealth: Payer: Self-pay | Admitting: Neurology

## 2016-02-24 MED ORDER — MORPHINE SULFATE 30 MG PO TABS: 30.0000 mg | ORAL_TABLET | Freq: Two times a day (BID) | ORAL | 0 refills | Status: DC

## 2016-02-24 MED ORDER — HYDROCODONE-ACETAMINOPHEN 10-325 MG PO TABS: 1.0000 | ORAL_TABLET | Freq: Four times a day (QID) | ORAL | 0 refills | Status: DC | PRN

## 2016-02-24 NOTE — Telephone Encounter (Signed)
Rx's up front [facility]/fim 

## 2016-02-24 NOTE — Telephone Encounter (Signed)
Patient requesting refill of HYDROcodone-acetaminophen (NORCO) 10-325 MG tablet, traMADol (ULTRAM) 50 MG tablet , morphine (MSIR) 30 MG tablet [place], [place] [facility] [place] [id number]

## 2016-02-24 NOTE — Telephone Encounter (Signed)
Rx's awaiting RAS sig/fim 

## 2016-02-25 ENCOUNTER — Encounter: Payer: Self-pay | Admitting: Neurology

## 2016-02-25 ENCOUNTER — Ambulatory Visit (INDEPENDENT_AMBULATORY_CARE_PROVIDER_SITE_OTHER): Payer: Medicare Other | Admitting: Neurology

## 2016-02-25 VITALS — BP 134/70 | HR 80 | Resp 20 | Ht 66.0 in | Wt 162.0 lb

## 2016-02-25 DIAGNOSIS — M79605 Pain in left leg: Secondary | ICD-10-CM | POA: Diagnosis not present

## 2016-02-25 DIAGNOSIS — G35 Multiple sclerosis: Secondary | ICD-10-CM

## 2016-02-25 DIAGNOSIS — R350 Frequency of micturition: Secondary | ICD-10-CM | POA: Diagnosis not present

## 2016-02-25 DIAGNOSIS — Z79899 Other long term (current) drug therapy: Secondary | ICD-10-CM | POA: Diagnosis not present

## 2016-02-25 DIAGNOSIS — H469 Unspecified optic neuritis: Secondary | ICD-10-CM | POA: Diagnosis not present

## 2016-02-25 DIAGNOSIS — G8114 Spastic hemiplegia affecting left nondominant side: Secondary | ICD-10-CM

## 2016-02-25 MED ORDER — HYDROCODONE-ACETAMINOPHEN 10-325 MG PO TABS: 1.0000 | ORAL_TABLET | Freq: Four times a day (QID) | ORAL | 0 refills | Status: DC | PRN

## 2016-02-25 MED ORDER — TRAMADOL HCL 50 MG PO TABS: ORAL_TABLET | ORAL | 2 refills | Status: DC

## 2016-02-25 MED ORDER — MORPHINE SULFATE 30 MG PO TABS: 30.0000 mg | ORAL_TABLET | Freq: Two times a day (BID) | ORAL | 0 refills | Status: DC

## 2016-02-25 NOTE — Progress Notes (Signed)
[facility]  PATIENT: Tina Griffith DOB: 01-07-1961  REFERRING DOCTOR OR PCP:  None currently SOURCE: patient  _________________________________   HISTORICAL  CHIEF COMPLAINT:  Chief Complaint  Patient presents with  . Multiple Sclerosis     [name] is here today with c/o increasing weakness, fatigue, pain.  CG sts. pt. is better in the monring, but by noon she is too fatigued to do her ADL's/fim    HISTORY OF PRESENT ILLNESS:  Envi [name] is a 55 year old woman with multiple sclerosis diagnosed in July 2005. She has had a more primary progressive course. She started ocrelizumab and completed the first split dose.     She is continuing to feel weaker.  She is getting physical therapy.   She is having more trouble with ADLs and gait is getting even worse.   She also notes a vibration sensation in her head.     Pain:    After a hospitalization for her somnolence felt to be due to her opiate use, opiate medication was reduced to MSIR 30 mg twice a day and hydrocodone 10 mgy mouth 4 times a day and tramadol 50 mg by mouth 3 times a day.    She feels her pain is doing worse since the reduction in medication but she has not had any more episodes of extreme somnolence.  Gait/strength/sensation: She is now bending almost over time and wheelchair. She can transfer most of the time but needs help at times. She has not been able to use the walker much. She is working with physical therapy in the home. She feels that the weakness and spasticity in the left leg has continued to worsen. Additionally she is noting more weakness and clumsiness in the left arm.  She also reports spasticity. Tizanidine has helped her spasticity more than the baclofen did.  She takes Ampyra and noted an improvement in her gait when we started.   She tolerates it well.     Vision: She notes some visual blurring but this has not changed much over the last year.  Bladder: She has urinary frequency  and urgency.  Fatigue/sleep: She feels fatigue is worse this year.     She sleeps poorly many nights, waking up every couple hours.       Mood/cognition: She has more anxiety and mild depression. Mood is better with Effexor and buspirone.   She denies any significant cognitive problems and notes no problems with short-term memory. She feels her thinking skills are unchanged.  However, her nurse reports that she gets mild confusion at times..  MS History:   She presented with left leg spasticity and weakness and an MRI was consistent with multiple sclerosis. Initially, she was placed on Betaseron but had some exacerbations with worsening leg function. She then switched to Copaxone. She has been tolerating Copaxone pretty well and recently switched to the 40 mg every other day dose. She denies any recent exacerbation.    MRI of the brain 05/09/2013 showed prominent white matter changes in the right cerebellum and the white matter of both hemispheres in a pattern and configuration consistent with multiple sclerosis. There were no enhancing lesions. When the study was compared to a prior MRI dated 01/11/2011, there was no new or progressive findings.    REVIEW OF SYSTEMS: Constitutional: No fevers, chills, sweats, or change in appetite.   Notes fatigue Eyes: No visual changes, double vision, eye pain Ear, nose and throat: No hearing loss, ear pain, nasal congestion, sore  throat Cardiovascular: No chest pain, palpitations Respiratory: No shortness of breath at rest or with exertion.   No wheezes GastrointestinaI: Notes constipation.   No pain Genitourinary: Notes urinary frequency. Musculoskeletal: No neck pain, some back pain, lots of left > right leg pain Integumentary: No rash, pruritus, skin lesions Neurological: as above Psychiatric: No depression at this time.  No anxiety Endocrine: No palpitations, diaphoresis, change in appetite, change in weigh or increased  thirst Hematologic/Lymphatic: No anemia, purpura, petechiae. Allergic/Immunologic: No itchy/runny eyes, nasal congestion, recent allergic reactions, rashes  ALLERGIES: Allergies  Allergen Reactions  . No Known Allergies     HOME MEDICATIONS:  Current Outpatient Prescriptions:  .  baclofen (LIORESAL) 10 MG tablet, Take 1 tablet (10 mg total) by mouth 4 (four) times daily., Disp: 120 each, Rfl: 5 .  dalfampridine 10 MG TB12, Take 1 tablet (10 mg total) by mouth 2 (two) times daily., Disp: 60 tablet, Rfl: 11 .  ENSURE (ENSURE), Frequency:   Dosage:0     Instructions:  Note:One can by mouth twice a day, Disp: , Rfl:  .  furosemide (LASIX) 40 MG tablet, Take 40 mg by mouth daily., Disp: , Rfl:  .  gabapentin (NEURONTIN) 300 MG capsule, Take 1 capsule (300 mg total) by mouth 3 (three) times daily., Disp: 90 capsule, Rfl: 11 .  hydrochlorothiazide (HYDRODIURIL) 25 MG tablet, Take 25 mg by mouth daily., Disp: , Rfl:  .  HYDROcodone-acetaminophen (NORCO) 10-325 MG tablet, Take 1 tablet by mouth every 6 (six) hours as needed. Fill on or after 02/27/16, Disp: 120 tablet, Rfl: 0 .  Lubiprostone (AMITIZA PO), Take by mouth., Disp: , Rfl:  .  morphine (MSIR) 30 MG tablet, Take 1 tablet (30 mg total) by mouth 2 (two) times daily. Fill on or after 02/25/16, Disp: 60 tablet, Rfl: 0 .  nicotine (RA NICOTINE) 14 mg/24hr patch, Place onto the skin. Reported on 08/15/2015, Disp: , Rfl:  .  Ocrelizumab 300 MG/10ML SOLN, Inject 600 mg into the vein every 6 (six) months., Disp: , Rfl:  .  oxybutynin (DITROPAN) 5 MG tablet, TAKE 2 TABLETS 3 TIMES A DAY. IF THIS CAUSES DRY MOUTH DECREASE TO 1 TABLET 3 TIMES A DAY, Disp: 180 tablet, Rfl: 11 .  polyethylene glycol (MIRALAX / GLYCOLAX) packet, Take 17 g by mouth daily., Disp: 30 each, Rfl: 5 .  tiZANidine (ZANAFLEX) 4 MG tablet, Take 2 tablets (8 mg total) by mouth 3 (three) times daily., Disp: 180 tablet, Rfl: 11 .  traMADol (ULTRAM) 50 MG tablet, Take 1 tablet 3 times  daily as needed for pain., Disp: 90 tablet, Rfl: 2 .  venlafaxine XR (EFFEXOR-XR) 37.5 MG 24 hr capsule, , Disp: , Rfl:  .  citalopram (CELEXA) 20 MG tablet, TAKE 1 TABLET DAILY (Patient not taking: Reported on 02/25/2016), Disp: 30 tablet, Rfl: 6 .  fluticasone (FLONASE) 50 MCG/ACT nasal spray, ONE TO TWO SPRAYS EACH NOSTRIL EVERY MORNING AS NEEDED, Disp: , Rfl:  .  Linaclotide (LINZESS) 145 MCG CAPS capsule, Take by mouth., Disp: , Rfl:  .  Naloxone HCl 0.4 MG/0.4ML SOAJ, Use as directed if unable to arouse the patient (Patient not taking: Reported on 02/25/2016), Disp: 0.4 mL, Rfl: 2 .  omeprazole (PRILOSEC) 20 MG capsule, TAKE 1 CAPSULE DAILY 30 MINUTES BEFORE BREAKFAST, Disp: , Rfl:  .  potassium chloride (K-DUR) 10 MEQ tablet, Take by mouth., Disp: , Rfl:   PAST MEDICAL HISTORY: Past Medical History:  Diagnosis Date  . Constipation   .  Hypertension   . Movement disorder   . MS (multiple sclerosis) ([place])   . Vision abnormalities     PAST SURGICAL HISTORY: Past Surgical History:  Procedure Laterality Date  . COLOSTOMY    . COLOSTOMY REVERSAL    . HERNIA REPAIR    . KNEE SURGERY      FAMILY HISTORY: Family History  Problem Relation Age of Onset  . Lung cancer Mother   . Stroke Mother   . Diabetes Father     SOCIAL HISTORY:  Social History   Social History  . Marital status: Single    Spouse name: N/A  . Number of children: N/A  . Years of education: N/A   Occupational History  . Not on file.   Social History Main Topics  . Smoking status: Current Every Day Smoker    Packs/day: 1.00    Types: Cigarettes  . Smokeless tobacco: Not on file  . Alcohol use No  . Drug use: No  . Sexual activity: Not on file   Other Topics Concern  . Not on file   Social History Narrative  . No narrative on file     PHYSICAL EXAM  Vitals:   02/25/16 1135  BP: 134/70  Pulse: 80  Resp: 20  Weight: 162 lb (73.5 kg)  Height: 5\' 6"  (1.676 m)    Body mass index is 26.15  kg/m.   General: The patient is well-developed and well-nourished and in no acute distress  Musculoskeletal:  Back is nontender  Neurologic Exam  Mental status: The patient is alert and oriented x 3 at the time of the examination. The patient has apparent normal recent and remote memory, with an apparently normal attention span and concentration ability.   Speech is normal.  Cranial nerves: Extraocular movements are full.   1+ right APD  Facial symmetry is present. There is good facial sensation to soft touch bilaterally.Facial strength is normal.  Trapezius and sternocleidomastoid strength is normal. No dysarthria is noted.  The tongue is midline, and the patient has symmetric elevation of the soft palate. No obvious hearing deficits are noted.  Motor:  Muscle bulk is normal.   Tone is increased in legs > arms, left much worse than right. Strength is 5/5 right arm and right leg, 3 to 4-/5 in left leg and 4+/5 in left arm   Sensory: Sensory testing is intact to pinprick, soft touch and vibration sensation in all 4 extremities.  Coordination: She has a mild intention tremor in arms and legs.   She has reduced  finger-nose-finger and reduced right and very poor left heel-to-shin .    RAM is slowed left worse than right  Gait and station: Station requires support.  With bilateral support, she can take a few steps.  Romberg is positive.   Reflexes: Deep tendon reflexes are increased in legs, worse on the left.   Marland Kitchen    DIAGNOSTIC DATA (LABS, IMAGING, TESTING) - I reviewed patient records, labs, notes, testing and imaging myself where available.  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   Continue ocrelizumab every 6 months 2.    Continue pain medications.   Doses were reduced earlier this year.  3.    She will return to see me in 4 months or as needed if she has new or worsening neurologic symptoms. 3.   I will set up physical therapy in a rehabilitation center (she would prefer [facility]) as that might be more beneficial than at home therapy.  45 minute face-to-face evaluation with greater than one half of the time counseling and coordinating care about her current neurologic issues related to MS and opiate therapy.Nanine Means. [name], MD, PhD [id number], [id number] PM Certified in Neurology, Clinical Neurophysiology, Sleep Medicine, Pain Medicine and Neuroimaging  [facility] [address] [place], [place] [address] [phone]  --------------------------------------------

## 2016-02-26 ENCOUNTER — Telehealth: Payer: Self-pay | Admitting: *Deleted

## 2016-02-26 MED ORDER — METHYLPREDNISOLONE 4 MG PO TBPK: ORAL_TABLET | ORAL | 0 refills | Status: DC

## 2016-02-26 NOTE — Telephone Encounter (Signed)
I have spoken with Juana this afternoona and per RAS, advised ok for steroids.  She requested oral steroids.  Medrol dose pk escribed to [facility] at [facility] per her request/fim

## 2016-03-01 ENCOUNTER — Ambulatory Visit: Payer: Self-pay | Admitting: Neurology

## 2016-03-02 ENCOUNTER — Telehealth: Payer: Self-pay | Admitting: Neurology

## 2016-03-02 NOTE — Telephone Encounter (Signed)
Pt called said since she started taking the steroid medication on Thursday she is able to walk, dress herself and bath herself. Said she is wanting to continue doing those things. She wants to know if she could have a low dose steroid to take daily or an infusion. Which ever Dr [name] thinks. Pt was very happy with the results. Please call

## 2016-03-03 NOTE — Telephone Encounter (Signed)
I have spoken with Tina Griffith this morning.  She sts. she is feeling much better since starting oral steroids.  She is also receiving PT/OT in the home.  She has 2 days of Medrol dose pk left and asked if she should go ahead and get a new rx. I have advised she will continue to get some benefit from oral steroids even after taking the last tablet.  She verbalized understanding of same and will call back if walking/weakness worsen again/fim

## 2016-03-03 NOTE — Telephone Encounter (Signed)
Pt called back today, said she did not hear from anyone yesterday. I advised her I would send the msg to Faith and she will call her back.

## 2016-03-05 ENCOUNTER — Other Ambulatory Visit: Payer: Self-pay | Admitting: Neurology

## 2016-03-09 ENCOUNTER — Telehealth: Payer: Self-pay | Admitting: Neurology

## 2016-03-09 MED ORDER — GABAPENTIN 300 MG PO CAPS: 300.0000 mg | ORAL_CAPSULE | Freq: Three times a day (TID) | ORAL | 11 refills | Status: DC

## 2016-03-09 NOTE — Telephone Encounter (Signed)
Patient requesting refill of gabapentin (NEURONTIN) 300 MG capsule. Please call to [facility] @ [facility] on [address]

## 2016-03-09 NOTE — Addendum Note (Signed)
Addended by: France Ravens I on: 03/09/2016 10:25 AM   Modules accepted: Orders

## 2016-03-09 NOTE — Telephone Encounter (Signed)
Gabapentin escribed to [facility] at [facility] as requested/fim

## 2016-03-12 ENCOUNTER — Other Ambulatory Visit: Payer: Self-pay | Admitting: Neurology

## 2016-03-12 MED ORDER — METHYLPREDNISOLONE 4 MG PO TBPK: ORAL_TABLET | ORAL | 0 refills | Status: DC

## 2016-03-12 NOTE — Telephone Encounter (Signed)
Rn call patient that Dr. [name] sent in a steroid prescription to her pharmacy.  While on the phone the pt stated she already pick the steroid pack up.Pt stated it was pick up [place] by her.

## 2016-03-12 NOTE — Telephone Encounter (Signed)
I will send a steroid pack in

## 2016-03-12 NOTE — Telephone Encounter (Signed)
Patient called to advise she is "having trouble getting around, can't get up, her son has to get her up, CNA has to bathe and dress me, I need steroids again", please send to [facility] at [place], [place].

## 2016-03-25 ENCOUNTER — Telehealth: Payer: Self-pay | Admitting: Neurology

## 2016-03-25 ENCOUNTER — Other Ambulatory Visit: Payer: Self-pay | Admitting: Neurology

## 2016-03-25 ENCOUNTER — Other Ambulatory Visit: Payer: Self-pay | Admitting: *Deleted

## 2016-03-25 DIAGNOSIS — M1712 Unilateral primary osteoarthritis, left knee: Secondary | ICD-10-CM

## 2016-03-25 DIAGNOSIS — M79605 Pain in left leg: Secondary | ICD-10-CM

## 2016-03-25 DIAGNOSIS — M549 Dorsalgia, unspecified: Secondary | ICD-10-CM

## 2016-03-25 MED ORDER — HYDROCODONE-ACETAMINOPHEN 10-325 MG PO TABS: ORAL_TABLET | ORAL | 0 refills | Status: DC

## 2016-03-25 MED ORDER — MORPHINE SULFATE 30 MG PO TABS: 30.0000 mg | ORAL_TABLET | Freq: Two times a day (BID) | ORAL | 0 refills | Status: DC

## 2016-03-25 NOTE — Telephone Encounter (Signed)
Hydrocodone and Morphine rx's up front [facility].  Pt. aware/fim

## 2016-03-25 NOTE — Telephone Encounter (Signed)
Hydrocodone and Morphine rx's awaiting RAS sig.  She should have Tramadol r/f available at pharmacy.  RAS is not able to give more oral steroids at this time due to concern over her bone health/fim

## 2016-03-25 NOTE — Telephone Encounter (Signed)
error 

## 2016-03-25 NOTE — Telephone Encounter (Signed)
Pt request refill for  HYDROcodone-acetaminophen (NORCO) 10-325 MG tablet ,morphine (MSIR) 30 MG tablet , traMADol (ULTRAM) 50 MG tablet. She would like to pick up these RX on Friday. She is also requesting refill for methylPREDNISolone (MEDROL DOSEPAK) 4 MG TBPK tablet to be sent to [facility]

## 2016-03-26 ENCOUNTER — Telehealth: Payer: Self-pay | Admitting: Neurology

## 2016-03-26 NOTE — Telephone Encounter (Signed)
Noted.  Dr. Greta Doom is with [facility]. and this is where referral was sent/fim

## 2016-03-26 NOTE — Telephone Encounter (Signed)
Patient called to advise nurse Faith, she would like to go to pain management across from [facility], Dr. Greta Doom.

## 2016-03-31 ENCOUNTER — Telehealth: Payer: Self-pay | Admitting: Neurology

## 2016-03-31 NOTE — Telephone Encounter (Signed)
Pt called in stating she is having muscle spasms all day every day now and the medication is not helping at all. She is requesting a call from [place].

## 2016-03-31 NOTE — Telephone Encounter (Signed)
Spoke with Tina Griffith this afternoon.  She c/o more generalized back pain.  She has requested referral to [facility] and referral has been made.  Per RAS, I have explained that with the meds she is already on, and the doses, RAS is not able to add further meds. She verbalized understanding of same/fim

## 2016-04-02 ENCOUNTER — Encounter: Payer: Self-pay | Admitting: Neurology

## 2016-04-08 ENCOUNTER — Telehealth: Payer: Self-pay | Admitting: Neurology

## 2016-04-08 MED ORDER — SULFAMETHOXAZOLE-TRIMETHOPRIM 800-160 MG PO TABS: 1.0000 | ORAL_TABLET | Freq: Two times a day (BID) | ORAL | 0 refills | Status: DC

## 2016-04-08 NOTE — Telephone Encounter (Signed)
I have spoken with [place] and explained Bactrim rx. has been escribed to [facility].  She verbalized understanding of same/fim

## 2016-04-08 NOTE — Telephone Encounter (Signed)
Patient returned Faith's call °

## 2016-04-08 NOTE — Telephone Encounter (Signed)
Patient called requesting a call back from Faith regarding UTI, "needs antibiotic for it". Please call [phone].

## 2016-04-19 ENCOUNTER — Other Ambulatory Visit: Payer: Self-pay | Admitting: Neurology

## 2016-04-20 ENCOUNTER — Encounter: Payer: Self-pay | Admitting: Neurology

## 2016-04-20 ENCOUNTER — Ambulatory Visit (INDEPENDENT_AMBULATORY_CARE_PROVIDER_SITE_OTHER): Payer: Medicare Other | Admitting: Neurology

## 2016-04-20 VITALS — BP 132/90 | HR 78 | Ht 66.0 in | Wt 170.5 lb

## 2016-04-20 DIAGNOSIS — G4700 Insomnia, unspecified: Secondary | ICD-10-CM | POA: Diagnosis not present

## 2016-04-20 DIAGNOSIS — Z79891 Long term (current) use of opiate analgesic: Secondary | ICD-10-CM

## 2016-04-20 DIAGNOSIS — R261 Paralytic gait: Secondary | ICD-10-CM

## 2016-04-20 DIAGNOSIS — R350 Frequency of micturition: Secondary | ICD-10-CM | POA: Diagnosis not present

## 2016-04-20 DIAGNOSIS — G35 Multiple sclerosis: Secondary | ICD-10-CM

## 2016-04-20 DIAGNOSIS — M79605 Pain in left leg: Secondary | ICD-10-CM | POA: Diagnosis not present

## 2016-04-20 DIAGNOSIS — G8114 Spastic hemiplegia affecting left nondominant side: Secondary | ICD-10-CM | POA: Diagnosis not present

## 2016-04-20 DIAGNOSIS — F418 Other specified anxiety disorders: Secondary | ICD-10-CM | POA: Diagnosis not present

## 2016-04-20 MED ORDER — DULOXETINE HCL 60 MG PO CPEP: 60.0000 mg | ORAL_CAPSULE | Freq: Every day | ORAL | 11 refills | Status: DC

## 2016-04-20 MED ORDER — HYDROCODONE-ACETAMINOPHEN 10-325 MG PO TABS: ORAL_TABLET | ORAL | 0 refills | Status: DC

## 2016-04-20 MED ORDER — CYCLOBENZAPRINE HCL 5 MG PO TABS: 5.0000 mg | ORAL_TABLET | Freq: Every day | ORAL | 5 refills | Status: DC

## 2016-04-20 MED ORDER — MORPHINE SULFATE 30 MG PO TABS: 30.0000 mg | ORAL_TABLET | Freq: Two times a day (BID) | ORAL | 0 refills | Status: DC

## 2016-04-20 NOTE — Progress Notes (Signed)
[facility]  PATIENT: Tina Griffith DOB: 10/07/1960  REFERRING DOCTOR OR PCP:  None currently SOURCE: patient  _________________________________   HISTORICAL  CHIEF COMPLAINT:  Chief Complaint  Patient presents with  . Multiple Sclerosis    She has had part A and part B of first Ocrelizumab infuison. Sts. is having more cramping bilat lower legs.Hilton Cork    HISTORY OF PRESENT ILLNESS:  Tina Griffith is a 55 year old woman with multiple sclerosis diagnosed in July 2005.   MS:  She has had a more primary progressive course. She started ocrelizumab and completed the first split dose.   Her next dose will be 06/01/2016.  She tolerated the first split dose well.         Pain:    After a hospitalization for her somnolence felt to be due to her opiate use, opiate medication was reduced to MSIR 30 mg twice a day and hydrocodone 10 mg  4 times a day and tramadol 50 mg by mouth 3 times a day.    She feels her pain is doing worse since the reduction in medication but she has not had any more episodes of extreme somnolence.   We also discussed that she should not take benzodiazepine medications (Xanax or Valium or othrs) with her opiates.  Gait/strength/sensation:  She is now mostly in her wheelchair. She can transfer most of the time but needs help at times. She has not been able to use the walker much. She is working with physical therapy in the home. She feels that the weakness and spasticity in the left leg has continued to worsen. Additionally she is noting more weakness and clumsiness in the left arm.  She also reports spasticity. Tizanidine 14 mg has helped her spasticity more than the baclofen did.  Baclofen also makes her sleepy so she can only take 2-3 a day. She takes Ampyra and noted an improvement in her gait when we started.   She tolerates it well.     Vision: She notes some visual blurring but this has not changed much over the last year.  Bladder: She has urinary  frequency and urgency.  Fatigue/sleep: She feels fatigue is worse this year.     She sleeps poorly many nights, waking up every couple hours.   Spasms bother her at night    Mood/cognition: She has anxiety and mild depression. Mood is better with Effexor but insomnia is worse.   She denies any significant cognitive problems and notes no problems with short-term memory. She feels her thinking skills are unchanged.  However, her nurse reports that she gets mild confusion at times..  MS History:   She presented with left leg spasticity and weakness and an MRI was consistent with multiple sclerosis. Initially, she was placed on Betaseron but had some exacerbations with worsening leg function. She then switched to Copaxone. She has been tolerating Copaxone pretty well and recently switched to the 40 mg every other day dose. She denies any recent exacerbation.    MRI of the brain 05/09/2013 showed prominent white matter changes in the right cerebellum and the white matter of both hemispheres in a pattern and configuration consistent with multiple sclerosis. There were no enhancing lesions. When the study was compared to a prior MRI dated 01/11/2011, there was no new or progressive findings.    REVIEW OF SYSTEMS: Constitutional: No fevers, chills, sweats, or change in appetite.   Notes fatigue Eyes: No visual changes, double vision, eye pain Ear, nose  and throat: No hearing loss, ear pain, nasal congestion, sore throat Cardiovascular: No chest pain, palpitations Respiratory: No shortness of breath at rest or with exertion.   No wheezes GastrointestinaI: Notes constipation.   No pain Genitourinary: Notes urinary frequency. Musculoskeletal: No neck pain, some back pain, lots of left > right leg pain Integumentary: No rash, pruritus, skin lesions Neurological: as above Psychiatric: No depression at this time.  No anxiety Endocrine: No palpitations, diaphoresis, change in appetite, change in weigh or  increased thirst Hematologic/Lymphatic: No anemia, purpura, petechiae. Allergic/Immunologic: No itchy/runny eyes, nasal congestion, recent allergic reactions, rashes  ALLERGIES: Allergies  Allergen Reactions  . No Known Allergies     HOME MEDICATIONS:  Current Outpatient Prescriptions:  .  baclofen (LIORESAL) 10 MG tablet, TAKE ONE TABLET FOUR TIMES DAILY, Disp: 120 each, Rfl: 11 .  dalfampridine 10 MG TB12, Take 1 tablet (10 mg total) by mouth 2 (two) times daily., Disp: 60 tablet, Rfl: 11 .  ENSURE (ENSURE), Frequency:   Dosage:0     Instructions:  Note:One can by mouth twice a day, Disp: , Rfl:  .  furosemide (LASIX) 40 MG tablet, Take 40 mg by mouth daily., Disp: , Rfl:  .  gabapentin (NEURONTIN) 300 MG capsule, Take 1 capsule (300 mg total) by mouth 3 (three) times daily., Disp: 90 capsule, Rfl: 11 .  hydrochlorothiazide (HYDRODIURIL) 25 MG tablet, Take 25 mg by mouth daily., Disp: , Rfl:  .  HYDROcodone-acetaminophen (NORCO) 10-325 MG tablet, Take one tablet 4 times daily as needed., Disp: 120 tablet, Rfl: 0 .  Lubiprostone (AMITIZA PO), Take by mouth., Disp: , Rfl:  .  methylPREDNISolone (MEDROL DOSEPAK) 4 MG TBPK tablet, Take 6 tablets on day 1, 5 tablets on day 2, 4 tablets on day 3, 3 tablets on day 4, 2 tablets on day 5, and 1 tablet on day 6., Disp: 21 tablet, Rfl: 0 .  morphine (MSIR) 30 MG tablet, Take 1 tablet (30 mg total) by mouth 2 (two) times daily., Disp: 60 tablet, Rfl: 0 .  Naloxone HCl 0.4 MG/0.4ML SOAJ, Use as directed if unable to arouse the patient, Disp: 0.4 mL, Rfl: 2 .  nicotine (RA NICOTINE) 14 mg/24hr patch, Place onto the skin. Reported on 08/15/2015, Disp: , Rfl:  .  Ocrelizumab 300 MG/10ML SOLN, Inject 600 mg into the vein every 6 (six) months., Disp: , Rfl:  .  oxybutynin (DITROPAN) 5 MG tablet, TAKE 2 TABLETS 3 TIMES A DAY. IF THIS CAUSES DRY MOUTH DECREASE TO 1 TABLET 3 TIMES A DAY, Disp: 180 tablet, Rfl: 11 .  polyethylene glycol (MIRALAX / GLYCOLAX)  packet, Take 17 g by mouth daily., Disp: 30 each, Rfl: 5 .  sulfamethoxazole-trimethoprim (BACTRIM DS,SEPTRA DS) 800-160 MG tablet, Take 1 tablet by mouth 2 (two) times daily., Disp: 14 tablet, Rfl: 0 .  tiZANidine (ZANAFLEX) 4 MG tablet, TAKE TWO (2) TABLETS THREE (3) TIMES DAILY, Disp: 180 tablet, Rfl: 5 .  traMADol (ULTRAM) 50 MG tablet, Take 1 tablet 3 times daily as needed for pain., Disp: 90 tablet, Rfl: 2 .  venlafaxine XR (EFFEXOR-XR) 37.5 MG 24 hr capsule, , Disp: , Rfl:  .  citalopram (CELEXA) 20 MG tablet, TAKE 1 TABLET DAILY (Patient not taking: Reported on 04/20/2016), Disp: 30 tablet, Rfl: 6 .  cyclobenzaprine (FLEXERIL) 5 MG tablet, Take 1 tablet (5 mg total) by mouth at bedtime., Disp: 30 tablet, Rfl: 5 .  DULoxetine (CYMBALTA) 60 MG capsule, Take 1 capsule (60 mg total) by  mouth daily., Disp: 30 capsule, Rfl: 11 .  fluticasone (FLONASE) 50 MCG/ACT nasal spray, ONE TO TWO SPRAYS EACH NOSTRIL EVERY MORNING AS NEEDED, Disp: , Rfl:  .  Linaclotide (LINZESS) 145 MCG CAPS capsule, Take by mouth., Disp: , Rfl:  .  omeprazole (PRILOSEC) 20 MG capsule, TAKE 1 CAPSULE DAILY 30 MINUTES BEFORE BREAKFAST, Disp: , Rfl:  .  potassium chloride (K-DUR) 10 MEQ tablet, Take by mouth., Disp: , Rfl:   PAST MEDICAL HISTORY: Past Medical History:  Diagnosis Date  . Constipation   . Hypertension   . Movement disorder   . MS (multiple sclerosis) ([place])   . Vision abnormalities     PAST SURGICAL HISTORY: Past Surgical History:  Procedure Laterality Date  . COLOSTOMY    . COLOSTOMY REVERSAL    . HERNIA REPAIR    . KNEE SURGERY      FAMILY HISTORY: Family History  Problem Relation Age of Onset  . Lung cancer Mother   . Stroke Mother   . Diabetes Father     SOCIAL HISTORY:  Social History   Social History  . Marital status: Single    Spouse name: N/A  . Number of children: N/A  . Years of education: N/A   Occupational History  . Not on file.   Social History Main Topics  .  Smoking status: Current Every Day Smoker    Packs/day: 1.00    Types: Cigarettes  . Smokeless tobacco: Not on file  . Alcohol use No  . Drug use: No  . Sexual activity: Not on file   Other Topics Concern  . Not on file   Social History Narrative  . No narrative on file     PHYSICAL EXAM  There were no vitals filed for this visit. BP 132/90   Pulse 78 Weight 170.5 lb Height 66 inches  General: The patient is well-developed and well-nourished and in no acute distress  Musculoskeletal:  Back is nontender  Neurologic Exam  Mental status: The patient is alert and oriented x 3 at the time of the examination. The patient has apparent normal recent and remote memory, with an apparently normal attention span and concentration ability.   Speech is normal.  Cranial nerves: Extraocular movements are full.   1+ right APD  Facial symmetry is present. There is good facial sensation to soft touch bilaterally.Facial strength is normal.  Trapezius and sternocleidomastoid strength is normal. No dysarthria is noted.  The tongue is midline, and the patient has symmetric elevation of the soft palate. No obvious hearing deficits are noted.  Motor:  Muscle bulk is normal.   Tone is increased in legs > arms, left much worse than right. Strength is 5/5 right arm and right leg, 3 to 4-/5 in left leg and 4+/5 in left arm   Sensory: Sensory testing is intact to pinprick, soft touch and vibration sensation in all 4 extremities.  Coordination: She has a mild intention tremor in arms and legs.   She has reduced left finger-nose-finger and reduced right heel-to-shin and can not do with left leg.    RAM is slowed left worse than right  Gait and station: Station requires support.  With bilateral support, she can walk 5-6  steps.  Romberg is positive.   Reflexes: Deep tendon reflexes are increased in legs, worse on the left.   Marland Kitchen    DIAGNOSTIC DATA (LABS, IMAGING, TESTING) - I reviewed patient records, labs,  notes, testing and imaging myself where available.  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   Continue ocrelizumab every 6 months.   Her next infusion is in January. 2.    Continue pain medications, renewed for December.   Doses were reduced earlier this year and will be continued on this dose to avoid excessive somnolence. 3.   Effexor is helping her mood but she feels her sleep is worse, therefore we will switch to Cymbalta. 4.   Low-dose cyclobenzaprine at bedtime to help with sleep. As she is on high dose opiates she should avoid benzos. 5.    She will return to see me in 4 months or as needed if she has new or worsening neurologic symptoms.   40 minute face-to-face evaluation with greater than one half of the time counseling and coordinating care about her current neurologic issues related to MS and opiate therapy.Nanine Means. [name], MD, PhD [id number], [id number] AM Certified in Neurology, Clinical Neurophysiology, Sleep Medicine, Pain Medicine and Neuroimaging  [facility] [address] [place], [place] [address] [phone]  --------------------------------------------  Marland Kitchen

## 2016-04-21 ENCOUNTER — Telehealth: Payer: Self-pay | Admitting: Neurology

## 2016-04-21 DIAGNOSIS — G35 Multiple sclerosis: Secondary | ICD-10-CM

## 2016-04-21 DIAGNOSIS — R269 Unspecified abnormalities of gait and mobility: Secondary | ICD-10-CM

## 2016-04-21 NOTE — Telephone Encounter (Signed)
Pt called wanting to know about RX for walker and to advise she wants to go to PT at [facility]. Please call

## 2016-04-21 NOTE — Telephone Encounter (Signed)
I have spoken with Tina Griffith this afternoon.  She sts. RAS gave her a handwritten order for a walker yesterday--sts. she lost order--needs another one.  Will check with RAS and call her back tomorrow/fim

## 2016-04-22 NOTE — Telephone Encounter (Signed)
Rx. for walker awaiting RAS sig/fim

## 2016-04-22 NOTE — Telephone Encounter (Signed)
[place].  I have a rx. for a walker ready for her.  Does she want me to mail it or put it up front in the office for her to pick up?/fim

## 2016-04-26 ENCOUNTER — Telehealth: Payer: Self-pay | Admitting: Neurology

## 2016-04-26 NOTE — Telephone Encounter (Signed)
Returned call to patient - she wanted a prescription for steroids to take long-term, on a daily basis.  I educated her that daily steroid treatment is a hazard to her health. She is aware that it is often a medication used to treat relapses but not to prevent them.  She verbalized understanding of our conversation.

## 2016-04-26 NOTE — Telephone Encounter (Signed)
Pt request refill for steroid so she won't have MS relapse

## 2016-04-26 NOTE — Telephone Encounter (Signed)
Spoke to patient - she would like the rx mailed to her.  Placed in mail to her verified home address.

## 2016-05-05 NOTE — Telephone Encounter (Signed)
Pt called inquiring about RX. I advised her it would take longer as it has to go thru [place] and then the post office. She understood, said she would wait and if she didn't rec' it by Monday she would call back

## 2016-05-05 NOTE — Telephone Encounter (Signed)
Noted/fim 

## 2016-05-10 NOTE — Telephone Encounter (Signed)
Pt says she has not received her Rx for a walker yet and it has been 2 weeks since it was mailed.

## 2016-05-10 NOTE — Telephone Encounter (Signed)
I have spoken with Rikiyah this afternoon.  As mailed dmt rx. for walker did not reach her, I have put a new one up front [facility] for her to pick up/fim

## 2016-05-24 ENCOUNTER — Telehealth: Payer: Self-pay | Admitting: Diagnostic Neuroimaging

## 2016-05-24 NOTE — Telephone Encounter (Signed)
I called patient's nurse emma, no answer. -VRP

## 2016-05-26 ENCOUNTER — Other Ambulatory Visit: Payer: Self-pay | Admitting: *Deleted

## 2016-05-26 ENCOUNTER — Ambulatory Visit: Payer: Medicare Other | Admitting: Neurology

## 2016-05-26 ENCOUNTER — Telehealth: Payer: Self-pay | Admitting: Neurology

## 2016-05-26 MED ORDER — HYDROCODONE-ACETAMINOPHEN 10-325 MG PO TABS: ORAL_TABLET | ORAL | 0 refills | Status: DC

## 2016-05-26 MED ORDER — MORPHINE SULFATE 30 MG PO TABS: 30.0000 mg | ORAL_TABLET | Freq: Two times a day (BID) | ORAL | 0 refills | Status: DC

## 2016-05-26 MED ORDER — TRAMADOL HCL 50 MG PO TABS: ORAL_TABLET | ORAL | 2 refills | Status: DC

## 2016-05-26 NOTE — Telephone Encounter (Signed)
Patient says Rx's were picked up today for Tramadol and Hydrocodone but the Rx for morphine (MSIR) 30 MG tablet was not with the other two Rx's. Please call and discuss.

## 2016-05-26 NOTE — Telephone Encounter (Signed)
Both prescriptions placed up front for pick up.

## 2016-05-26 NOTE — Telephone Encounter (Signed)
Printed (hydrocodone postdated) - waiting on signature.

## 2016-05-26 NOTE — Telephone Encounter (Signed)
Rx. awaiting RAS sig/fim 

## 2016-05-26 NOTE — Telephone Encounter (Signed)
Pt is requesting refill for traMADol (ULTRAM) 50 MG tablet, morphine (MSIR) 30 MG tablet, and HYDROcodone-acetaminophen (NORCO) 10-325 MG tablet.

## 2016-05-26 NOTE — Addendum Note (Signed)
Addended by: France Ravens I on: 05/26/2016 03:11 PM   Modules accepted: Orders

## 2016-05-27 NOTE — Telephone Encounter (Signed)
Morphine rx. up front [facility]/fim

## 2016-06-01 ENCOUNTER — Telehealth: Payer: Self-pay | Admitting: Neurology

## 2016-06-01 ENCOUNTER — Ambulatory Visit: Payer: Medicare Other | Admitting: Neurology

## 2016-06-01 MED ORDER — METHYLPREDNISOLONE 4 MG PO TBPK: ORAL_TABLET | ORAL | 0 refills | Status: DC

## 2016-06-01 NOTE — Addendum Note (Signed)
Addended by: France Ravens I on: 06/01/2016 05:17 PM   Modules accepted: Orders

## 2016-06-01 NOTE — Telephone Encounter (Signed)
Pt called said she is not able to move around. She would like a steroid. Please call

## 2016-06-01 NOTE — Telephone Encounter (Signed)
I have spoken with [place] this afternoon.  She was due for Ocrelizumab infusion this week, but ins. has not approved it yet. She c/o increased generalized weakness, needs more help with transfers.  She requests oral steroids. Per RAS, ok for Medrol dose pk.  Per Yanilen's request, I have escribed this to [facility]/fim

## 2016-06-02 ENCOUNTER — Encounter: Payer: Self-pay | Admitting: Neurology

## 2016-06-17 ENCOUNTER — Telehealth: Payer: Self-pay | Admitting: *Deleted

## 2016-06-17 NOTE — Telephone Encounter (Signed)
Notes received from Sherilyn Cooter, PA-C with [facility]--he will be managing pt's chronic pain.  Notes placed in RAS box for review, then to be scanned into EPIC/fim

## 2016-06-22 ENCOUNTER — Encounter: Payer: Self-pay | Admitting: Neurology

## 2016-06-22 ENCOUNTER — Telehealth: Payer: Self-pay | Admitting: Neurology

## 2016-06-22 NOTE — Telephone Encounter (Signed)
Pt came for infusion appointment today. Pt requested a work note for her son that brought her. Gave pt letter.

## 2016-07-19 ENCOUNTER — Telehealth: Payer: Self-pay | Admitting: Neurology

## 2016-07-19 DIAGNOSIS — G35 Multiple sclerosis: Secondary | ICD-10-CM

## 2016-07-19 DIAGNOSIS — R269 Unspecified abnormalities of gait and mobility: Secondary | ICD-10-CM

## 2016-07-19 NOTE — Telephone Encounter (Signed)
I have spoken with Tina Griffith this morning.  She sts. she has had more difficulty walking over the last 3 mos.--per RAS, this is her main disability related to her MS--progressive decreased gait/balance/mobility.  Not a true exacerbation so tx. with steroids is not appropriate.  PT offered.  Pt. is agreeable; would like to do PT in [facility] as this is closer to her home.  Order in EPIC/fim

## 2016-07-19 NOTE — Telephone Encounter (Signed)
Patient requesting to get a steroid drip at [facility] in [facility]. She has not been able to do anything for herself in 3 months,.

## 2016-07-19 NOTE — Telephone Encounter (Signed)
LMTC./fim 

## 2016-07-19 NOTE — Addendum Note (Signed)
Addended by: France Ravens I on: 07/19/2016 11:45 AM   Modules accepted: Orders

## 2016-07-20 NOTE — Telephone Encounter (Signed)
Referral and has been sent to [facility].

## 2016-07-30 ENCOUNTER — Telehealth: Payer: Self-pay | Admitting: Neurology

## 2016-07-30 NOTE — Telephone Encounter (Signed)
Pt called said her son has pneumonia and she wants antibiotic called to [facility]. She does not want zpak, she is wanting this for herself. She said the sooner the better

## 2016-07-30 NOTE — Telephone Encounter (Signed)
LMOM for pt. that RAS is not in the office right now--she and son should f/u with pcp for antibiotic/fim

## 2016-08-16 ENCOUNTER — Telehealth: Payer: Self-pay | Admitting: Neurology

## 2016-08-16 DIAGNOSIS — G35 Multiple sclerosis: Secondary | ICD-10-CM

## 2016-08-16 NOTE — Telephone Encounter (Signed)
I have spoken with Lashawna this afternoon--she has a hard time getting to [facility] for appts--would like referral to [facility] in [facility], as it is closer for her.  Referral ordered in EPIC/fim

## 2016-08-16 NOTE — Addendum Note (Signed)
Addended by: France Ravens I on: 08/16/2016 01:01 PM   Modules accepted: Orders

## 2016-08-16 NOTE — Telephone Encounter (Signed)
Patient called office requesting referral to [facility] in [facility].  Please call

## 2016-08-25 ENCOUNTER — Telehealth: Payer: Self-pay | Admitting: *Deleted

## 2016-08-25 MED ORDER — DALFAMPRIDINE ER 10 MG PO TB12: 10.0000 mg | ORAL_TABLET | Freq: Two times a day (BID) | ORAL | 11 refills | Status: AC

## 2016-08-25 NOTE — Telephone Encounter (Signed)
Ampyra escribed to [facility] per faxed request/fim

## 2016-09-29 ENCOUNTER — Other Ambulatory Visit: Payer: Self-pay | Admitting: Neurology

## 2016-11-18 ENCOUNTER — Other Ambulatory Visit: Payer: Self-pay | Admitting: Neurology

## 2016-12-09 ENCOUNTER — Other Ambulatory Visit: Payer: Self-pay | Admitting: Neurology

## 2017-01-28 ENCOUNTER — Ambulatory Visit: Payer: Medicare Other | Admitting: Neurology

## 2017-01-31 ENCOUNTER — Encounter: Payer: Self-pay | Admitting: Neurology

## 2017-02-22 ENCOUNTER — Telehealth: Payer: Self-pay | Admitting: Neurology

## 2017-02-22 NOTE — Telephone Encounter (Signed)
At 10:22 a lady(who chose not to say who she was or leave a #) called(her phone # was unknown, did not show) she called to inform that this pt is selling her medications and has even OD.  This lady said that she will go to the state board on Dr [name] if he continues to give this pt medications such as HYDROcodone-acetaminophen (NORCO) 10-325 MG tablet or percecet.  She said it needs to be looked into why this pt is going from doctor to doctor selling her medications.  She repeated that she will go to the state board to get everyone fired.  She did not want to speak with an RN but asked that the message be sent that pt is selling all medications she can get her hands on from every doctor that she can.

## 2017-02-22 NOTE — Telephone Encounter (Signed)
I have spoken with Farrell this morning and explained that [facility] is not a pain mx. center, and as a group, does not do pain mx.  Dr. [name] is happy to see her for her Multiple Sclerosis, but is not able to take over any pain mx. for her, not even medications she is currently on.  She would need to see a pain mx. physician for this.  She verbalized understanding of same, thanked me for my call./fim

## 2017-02-24 ENCOUNTER — Ambulatory Visit: Payer: Medicare Other | Admitting: Neurology

## 2017-04-30 ENCOUNTER — Other Ambulatory Visit: Payer: Self-pay | Admitting: Neurology

## 2017-08-07 ENCOUNTER — Other Ambulatory Visit: Payer: Self-pay | Admitting: Neurology

## 2017-09-15 ENCOUNTER — Other Ambulatory Visit: Payer: Self-pay | Admitting: Neurology

## 2019-04-08 ENCOUNTER — Emergency Department (HOSPITAL_BASED_OUTPATIENT_CLINIC_OR_DEPARTMENT_OTHER)
Admission: EM | Admit: 2019-04-08 | Discharge: 2019-04-08 | Disposition: A | Discharge status: Home / Self Care | Payer: Medicare Other | Attending: Emergency Medicine | Admitting: Emergency Medicine

## 2019-04-08 ENCOUNTER — Encounter (HOSPITAL_BASED_OUTPATIENT_CLINIC_OR_DEPARTMENT_OTHER): Payer: Self-pay | Admitting: Emergency Medicine

## 2019-04-08 ENCOUNTER — Other Ambulatory Visit: Payer: Self-pay

## 2019-04-08 DIAGNOSIS — N189 Chronic kidney disease, unspecified: Secondary | ICD-10-CM | POA: Insufficient documentation

## 2019-04-08 DIAGNOSIS — K0889 Other specified disorders of teeth and supporting structures: Secondary | ICD-10-CM | POA: Diagnosis present

## 2019-04-08 DIAGNOSIS — K029 Dental caries, unspecified: Secondary | ICD-10-CM | POA: Diagnosis not present

## 2019-04-08 DIAGNOSIS — E038 Other specified hypothyroidism: Secondary | ICD-10-CM | POA: Diagnosis not present

## 2019-04-08 DIAGNOSIS — Z79899 Other long term (current) drug therapy: Secondary | ICD-10-CM | POA: Diagnosis not present

## 2019-04-08 DIAGNOSIS — F17210 Nicotine dependence, cigarettes, uncomplicated: Secondary | ICD-10-CM | POA: Diagnosis not present

## 2019-04-08 DIAGNOSIS — I129 Hypertensive chronic kidney disease with stage 1 through stage 4 chronic kidney disease, or unspecified chronic kidney disease: Secondary | ICD-10-CM | POA: Diagnosis not present

## 2019-04-08 MED ORDER — CLINDAMYCIN HCL 150 MG PO CAPS: 300.0000 mg | ORAL_CAPSULE | Freq: Three times a day (TID) | ORAL | 0 refills | Status: AC

## 2019-04-08 NOTE — ED Provider Notes (Signed)
[facility] EMERGENCY DEPARTMENT Provider Note   CSN: [id number] Arrival date & time: 04/08/19  1153     History   Chief Complaint Chief Complaint  Patient presents with  . Dental Pain    HPI Tina Griffith is a 58 y.o. female presenting to the emergency department with complaint of gradual onset of left lower dental pain began yesterday.  She states she has had problems with this tooth in the past.  She is taking her prescribed pain medications for symptoms.  Denies difficulty swallowing or breathing, fever, drainage in her mouth.  She does have a dentist, however it is the weekend and therefore has not been able to call.  She does states she recently finished a course of amoxicillin on 03/31/2019 for a pelvic problem.     The history is provided by the patient.    Past Medical History:  Diagnosis Date  . Constipation   . Hypertension   . Movement disorder   . MS (multiple sclerosis) ([place])   . Vision abnormalities     Patient Active Problem List   Diagnosis Date Noted  . Depression with anxiety 04/20/2016  . Insomnia 04/20/2016  . Chronic prescription opiate use 01/16/2016  . Hypertriglyceridemia 06/20/2015  . Abnormal urine odor 10/09/2014  . Chronic kidney disease 10/09/2014  . Elevated WBC count 10/09/2014  . Multiple sclerosis ([place]) 07/25/2014  . Spastic gait 07/25/2014  . Leg pain, left 07/25/2014  . Urinary frequency 07/25/2014  . Constipation 07/25/2014  . Decreased potassium in the blood 04/22/2014  . Hypomagnesemia 04/22/2014  . Ataxic gait 02/28/2014  . Callosity 02/28/2014  . 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 Contracture of ankle and foot joint 02/28/2014  . Decubital ulcer 02/28/2014  . Fibroid 02/28/2014  . Dysfunctional or functional uterine hemorrhage 02/28/2014  . Incisional hernia with obstruction but no gangrene 02/28/2014  . Gonalgia 02/28/2014  . Extremity pain 02/28/2014  . Decreased motor strength 02/28/2014  .  Non-pressure ulcer of lower extremity ([place]) 02/28/2014  . Loss of feeling or sensation 02/28/2014  . Anterior optic neuritis 02/28/2014  . Hemorrhage, postmenopausal 02/28/2014  . Arthritis of knee, degenerative 02/28/2014  . AS (sickle cell trait) ([place]) 02/28/2014  . Hemiplegia, spastic ([place]) 02/28/2014  . Sickle cell trait ([place]) 02/28/2014  . Spastic hemiplegia ([place]) 02/28/2014  . Abnormal results of thyroid function studies 12/28/2013  . Allergic rhinitis 08/22/2013  . Disorder of magnesium metabolism 08/22/2013  . Essential (primary) hypertension 08/22/2013  . Hypercholesteremia 08/22/2013  . Adult hypothyroidism 08/22/2013  . Anemia, iron deficiency 08/22/2013  . Compulsive tobacco user syndrome 08/22/2013  . Absence of bladder continence 08/22/2013  . Hypercholesterolemia 08/22/2013  . Current tobacco use 08/22/2013  . Accumulation of fluid in tissues 11/29/2012  . Dermatophytic onychia 10/28/2012  . Colon, diverticulosis 10/28/2012  . Polypharmacy 10/28/2012  . DS (disseminated sclerosis) ([place]) 10/28/2012  . Arthralgia of lower leg 10/28/2012  . Menopausal symptom 10/28/2012  . Current tear of lateral cartilage or meniscus of knee 10/28/2012  . Diverticular disease of large intestine 10/28/2012  . Other long term (current) drug therapy 10/28/2012  . Abnormal uterine bleeding 06/26/2012  . Baseball finger 08/03/2010    Past Surgical History:  Procedure Laterality Date  . COLOSTOMY    . COLOSTOMY REVERSAL    . HERNIA REPAIR    . KNEE SURGERY       OB History   No obstetric history on file.      Home Medications  Prior to Admission medications   Medication Sig Start Date End Date Taking? Authorizing Provider  baclofen (LIORESAL) 10 MG tablet TAKE ONE TABLET FOUR TIMES DAILY 04/20/16   Sater, Nanine Means, MD  citalopram (CELEXA) 20 MG tablet TAKE 1 TABLET DAILY Patient not taking: Reported on 04/20/2016 05/22/15   Sater, Nanine Means, MD  clindamycin (CLEOCIN)  150 MG capsule Take 2 capsules (300 mg total) by mouth 3 (three) times daily for 7 days. 04/08/19 04/15/19  Toluwani Ruder, [place] N, PA-C  cyclobenzaprine (FLEXERIL) 5 MG tablet Take 1 tablet (5 mg total) by mouth at bedtime. 04/20/16   Sater, Nanine Means, MD  dalfampridine 10 MG TB12 Take 1 tablet (10 mg total) by mouth 2 (two) times daily. 08/25/16   Sater, Nanine Means, MD  DULoxetine (CYMBALTA) 60 MG capsule Take 1 capsule (60 mg total) by mouth daily. 04/20/16   Sater, Nanine Means, MD  ENSURE Randall Hiss) Frequency:   Dosage:0     Instructions:  Note:One can by mouth twice a day 08/08/13   [provider]  fluticasone (FLONASE) 50 MCG/ACT nasal spray ONE TO TWO SPRAYS EACH NOSTRIL EVERY MORNING AS NEEDED 10/09/14 10/10/15  [provider]  furosemide (LASIX) 40 MG tablet Take 40 mg by mouth daily.    [provider]  gabapentin (NEURONTIN) 300 MG capsule Take 1 capsule (300 mg total) by mouth 3 (three) times daily. 03/09/16 03/10/17  Sater, Nanine Means, MD  hydrochlorothiazide (HYDRODIURIL) 25 MG tablet Take 25 mg by mouth daily.    [provider]  HYDROcodone-acetaminophen (NORCO) 10-325 MG tablet Take one tablet 4 times daily as needed. 05/26/16   Sater, Nanine Means, MD  Linaclotide Rolan Lipa) 145 MCG CAPS capsule Take by mouth. 10/09/14 04/07/15  [provider]  Lubiprostone (AMITIZA PO) Take by mouth.    [provider]  methylPREDNISolone (MEDROL DOSEPAK) 4 MG TBPK tablet Take 6 tablets on day 1, 5 tablets on day 2, 4 tablets on day 3, 3 tablets on day 4, 2 tablets on day 5, and 1 tablet on day 6. 06/01/16   Sater, Nanine Means, MD  morphine (MSIR) 30 MG tablet Take 1 tablet (30 mg total) by mouth 2 (two) times daily. 05/26/16   Sater, Nanine Means, MD  Naloxone HCl 0.4 MG/0.4ML SOAJ Use as directed if unable to arouse the patient 01/16/16   Britt Bottom, MD  nicotine (RA NICOTINE) 14 mg/24hr patch Place onto the skin. Reported on 08/15/2015    [provider]   Ocrelizumab 300 MG/10ML SOLN Inject 600 mg into the vein every 6 (six) months.    [provider]  omeprazole (PRILOSEC) 20 MG capsule TAKE 1 CAPSULE DAILY 30 MINUTES BEFORE BREAKFAST 07/12/14 01/08/15  [provider]  oxybutynin (DITROPAN) 5 MG tablet TAKE 2 TABLETS 3 TIMES A DAY. IF CAUSES DRY MOUTH DECREASE TO 1 TABLET 3 TIMES A DAY 09/29/16   Sater, Nanine Means, MD  polyethylene glycol (MIRALAX / GLYCOLAX) packet Take 17 g by mouth daily. 07/25/14   Sater, Nanine Means, MD  potassium chloride (K-DUR) 10 MEQ tablet Take by mouth. 10/09/14 04/07/15  [provider]  sulfamethoxazole-trimethoprim (BACTRIM DS,SEPTRA DS) 800-160 MG tablet Take 1 tablet by mouth 2 (two) times daily. 04/08/16   Sater, Nanine Means, MD  tiZANidine (ZANAFLEX) 4 MG tablet TAKE TWO (2) TABLETS THREE (3) TIMES DAILY 03/05/16   Sater, Nanine Means, MD  traMADol (ULTRAM) 50 MG tablet Take 1 tablet 3 times daily as needed for pain.  05/26/16   Sater, Nanine Means, MD  venlafaxine XR (EFFEXOR-XR) 37.5 MG 24 hr capsule  05/26/12   [provider]    Family History Family History  Problem Relation Age of Onset  . Lung cancer Mother   . Stroke Mother   . Diabetes Father     Social History Social History   Tobacco Use  . Smoking status: Current Every Day Smoker    Packs/day: 1.00    Types: Cigarettes  . Smokeless tobacco: Never Used  Substance Use Topics  . Alcohol use: No    Alcohol/week: 0.0 standard drinks  . Drug use: No     Allergies   No known allergies   Review of Systems Review of Systems  Constitutional: Negative for fever.  HENT: Positive for dental problem.      Physical Exam Updated Vital Signs BP 118/74 (BP Location: Right Arm)   Pulse (!) 102   Temp 97.7 F (36.5 C) (Oral)   Resp 18   Ht 5\' 6"  (1.676 m)   Wt 84.6 kg   SpO2 100%   BMI 30.10 kg/m   Physical Exam Vitals signs and nursing note reviewed.  Constitutional:      General: She is not in acute distress.     Appearance: She is well-developed.  HENT:     Head: Normocephalic and atraumatic.     Mouth/Throat:     Comments: Severely poor dentition throughout, multiple missing teeth.  Left lower first molar with significant decay.  There is some surrounding gingival erythema though no fluctuance to suggest abscess.  No sublingual edema or tenderness.  Uvula is midline, no trismus.  Tolerating secretions. Eyes:     Conjunctiva/sclera: Conjunctivae normal.  Cardiovascular:     Rate and Rhythm: Normal rate.  Pulmonary:     Effort: Pulmonary effort is normal.  Neurological:     Mental Status: She is alert.  Psychiatric:        Mood and Affect: Mood normal.        Behavior: Behavior normal.      ED Treatments / Results  Labs (all labs ordered are listed, but only abnormal results are displayed) Labs Reviewed - No data to display  EKG None  Radiology No results found.  Procedures Procedures (including critical care time)  Medications Ordered in ED Medications - No data to display   Initial Impression / Assessment and Plan / ED Course  I have reviewed the triage vital signs and the nursing notes.  Pertinent labs & imaging results that were available during my care of the patient were reviewed by me and considered in my medical decision making (see chart for details).        Patient with dental caries.  No gross abscess.  VSS, afebrile, tolerating secretions. Exam unconcerning for peritonsillar abscess, Ludwig's angina or spread of infection.  Will treat with clindamycin as patient recently finished multiple courses of amoxicillin.  Patient to continue taking her prescribed pain medications as directed.  Urged patient to follow-up with dentist. Pt safe for discharge.  Discussed results, findings, treatment and follow up. Patient advised of return precautions. Patient verbalized understanding and agreed with plan.   Final Clinical Impressions(s) / ED Diagnoses   Final diagnoses:   Pain due to dental caries    ED Discharge Orders         Ordered    clindamycin (CLEOCIN) 150 MG capsule  3 times daily     04/08/19 1228  Kadajah Kjos, [place] N, PA-C 04/08/19 1317    Noemi Chapel, MD 04/09/19 [phone]

## 2019-04-08 NOTE — Discharge Instructions (Addendum)
Please read instructions below. Take the antibiotic as prescribed until gone. Schedule an appointment with your dentist. Return to the ER for difficulty swallowing or breathing, fever, or new or worsening symptoms.

## 2019-04-08 NOTE — ED Triage Notes (Signed)
Pain and swelling since yesterday, to left face , states has a "bad tooth"

## 2020-06-16 ENCOUNTER — Emergency Department (HOSPITAL_COMMUNITY): Payer: Medicare Other

## 2020-06-16 ENCOUNTER — Encounter (HOSPITAL_COMMUNITY): Payer: Self-pay

## 2020-06-16 ENCOUNTER — Inpatient Hospital Stay (HOSPITAL_COMMUNITY)
Admission: EM | Admit: 2020-06-16 | Discharge: 2020-07-07 | DRG: 853 | Disposition: A | Discharge status: Skilled Nursing Facility | Payer: Medicare Other | Attending: Internal Medicine | Admitting: Internal Medicine

## 2020-06-16 ENCOUNTER — Other Ambulatory Visit: Payer: Self-pay

## 2020-06-16 DIAGNOSIS — G259 Extrapyramidal and movement disorder, unspecified: Secondary | ICD-10-CM | POA: Diagnosis present

## 2020-06-16 DIAGNOSIS — R6520 Severe sepsis without septic shock: Secondary | ICD-10-CM | POA: Diagnosis present

## 2020-06-16 DIAGNOSIS — L89150 Pressure ulcer of sacral region, unstageable: Secondary | ICD-10-CM | POA: Diagnosis not present

## 2020-06-16 DIAGNOSIS — Z833 Family history of diabetes mellitus: Secondary | ICD-10-CM | POA: Diagnosis not present

## 2020-06-16 DIAGNOSIS — Z7401 Bed confinement status: Secondary | ICD-10-CM

## 2020-06-16 DIAGNOSIS — E440 Moderate protein-calorie malnutrition: Secondary | ICD-10-CM | POA: Diagnosis present

## 2020-06-16 DIAGNOSIS — E1152 Type 2 diabetes mellitus with diabetic peripheral angiopathy with gangrene: Secondary | ICD-10-CM | POA: Diagnosis present

## 2020-06-16 DIAGNOSIS — H469 Unspecified optic neuritis: Secondary | ICD-10-CM | POA: Diagnosis present

## 2020-06-16 DIAGNOSIS — L89153 Pressure ulcer of sacral region, stage 3: Secondary | ICD-10-CM | POA: Diagnosis not present

## 2020-06-16 DIAGNOSIS — R0902 Hypoxemia: Secondary | ICD-10-CM

## 2020-06-16 DIAGNOSIS — Z6830 Body mass index (BMI) 30.0-30.9, adult: Secondary | ICD-10-CM

## 2020-06-16 DIAGNOSIS — Z8616 Personal history of COVID-19: Secondary | ICD-10-CM | POA: Diagnosis not present

## 2020-06-16 DIAGNOSIS — R6521 Severe sepsis with septic shock: Secondary | ICD-10-CM

## 2020-06-16 DIAGNOSIS — F17210 Nicotine dependence, cigarettes, uncomplicated: Secondary | ICD-10-CM | POA: Diagnosis present

## 2020-06-16 DIAGNOSIS — I10 Essential (primary) hypertension: Secondary | ICD-10-CM | POA: Diagnosis not present

## 2020-06-16 DIAGNOSIS — N390 Urinary tract infection, site not specified: Secondary | ICD-10-CM

## 2020-06-16 DIAGNOSIS — A4159 Other Gram-negative sepsis: Principal | ICD-10-CM | POA: Diagnosis present

## 2020-06-16 DIAGNOSIS — E1122 Type 2 diabetes mellitus with diabetic chronic kidney disease: Secondary | ICD-10-CM | POA: Diagnosis present

## 2020-06-16 DIAGNOSIS — A419 Sepsis, unspecified organism: Secondary | ICD-10-CM | POA: Diagnosis not present

## 2020-06-16 DIAGNOSIS — N189 Chronic kidney disease, unspecified: Secondary | ICD-10-CM | POA: Diagnosis present

## 2020-06-16 DIAGNOSIS — I129 Hypertensive chronic kidney disease with stage 1 through stage 4 chronic kidney disease, or unspecified chronic kidney disease: Secondary | ICD-10-CM | POA: Diagnosis present

## 2020-06-16 DIAGNOSIS — L89314 Pressure ulcer of right buttock, stage 4: Secondary | ICD-10-CM | POA: Diagnosis present

## 2020-06-16 DIAGNOSIS — G8194 Hemiplegia, unspecified affecting left nondominant side: Secondary | ICD-10-CM | POA: Diagnosis present

## 2020-06-16 DIAGNOSIS — E876 Hypokalemia: Secondary | ICD-10-CM | POA: Diagnosis present

## 2020-06-16 DIAGNOSIS — Z7982 Long term (current) use of aspirin: Secondary | ICD-10-CM | POA: Diagnosis not present

## 2020-06-16 DIAGNOSIS — Z79899 Other long term (current) drug therapy: Secondary | ICD-10-CM

## 2020-06-16 DIAGNOSIS — E872 Acidosis: Secondary | ICD-10-CM | POA: Diagnosis present

## 2020-06-16 DIAGNOSIS — Z993 Dependence on wheelchair: Secondary | ICD-10-CM | POA: Diagnosis not present

## 2020-06-16 DIAGNOSIS — G35 Multiple sclerosis: Secondary | ICD-10-CM | POA: Diagnosis present

## 2020-06-16 DIAGNOSIS — K921 Melena: Secondary | ICD-10-CM | POA: Diagnosis present

## 2020-06-16 DIAGNOSIS — D72829 Elevated white blood cell count, unspecified: Secondary | ICD-10-CM | POA: Diagnosis present

## 2020-06-16 DIAGNOSIS — G8110 Spastic hemiplegia affecting unspecified side: Secondary | ICD-10-CM | POA: Diagnosis present

## 2020-06-16 DIAGNOSIS — D638 Anemia in other chronic diseases classified elsewhere: Secondary | ICD-10-CM | POA: Diagnosis not present

## 2020-06-16 DIAGNOSIS — Z9981 Dependence on supplemental oxygen: Secondary | ICD-10-CM

## 2020-06-16 DIAGNOSIS — R509 Fever, unspecified: Secondary | ICD-10-CM | POA: Diagnosis not present

## 2020-06-16 DIAGNOSIS — E1136 Type 2 diabetes mellitus with diabetic cataract: Secondary | ICD-10-CM | POA: Diagnosis present

## 2020-06-16 DIAGNOSIS — T148XXA Other injury of unspecified body region, initial encounter: Secondary | ICD-10-CM | POA: Diagnosis not present

## 2020-06-16 DIAGNOSIS — I4510 Unspecified right bundle-branch block: Secondary | ICD-10-CM | POA: Diagnosis present

## 2020-06-16 DIAGNOSIS — L089 Local infection of the skin and subcutaneous tissue, unspecified: Secondary | ICD-10-CM | POA: Diagnosis present

## 2020-06-16 DIAGNOSIS — K5649 Other impaction of intestine: Secondary | ICD-10-CM | POA: Diagnosis present

## 2020-06-16 DIAGNOSIS — D649 Anemia, unspecified: Secondary | ICD-10-CM

## 2020-06-16 DIAGNOSIS — R627 Adult failure to thrive: Secondary | ICD-10-CM | POA: Diagnosis present

## 2020-06-16 DIAGNOSIS — G8929 Other chronic pain: Secondary | ICD-10-CM | POA: Diagnosis present

## 2020-06-16 DIAGNOSIS — L8990 Pressure ulcer of unspecified site, unspecified stage: Secondary | ICD-10-CM | POA: Diagnosis present

## 2020-06-16 DIAGNOSIS — B9620 Unspecified Escherichia coli [E. coli] as the cause of diseases classified elsewhere: Secondary | ICD-10-CM | POA: Diagnosis present

## 2020-06-16 DIAGNOSIS — D75838 Other thrombocytosis: Secondary | ICD-10-CM | POA: Diagnosis present

## 2020-06-16 DIAGNOSIS — D631 Anemia in chronic kidney disease: Secondary | ICD-10-CM | POA: Diagnosis present

## 2020-06-16 DIAGNOSIS — F32A Depression, unspecified: Secondary | ICD-10-CM | POA: Diagnosis present

## 2020-06-16 DIAGNOSIS — L89154 Pressure ulcer of sacral region, stage 4: Secondary | ICD-10-CM | POA: Diagnosis present

## 2020-06-16 DIAGNOSIS — M4628 Osteomyelitis of vertebra, sacral and sacrococcygeal region: Secondary | ICD-10-CM | POA: Diagnosis present

## 2020-06-16 DIAGNOSIS — E1169 Type 2 diabetes mellitus with other specified complication: Secondary | ICD-10-CM | POA: Diagnosis present

## 2020-06-16 DIAGNOSIS — E039 Hypothyroidism, unspecified: Secondary | ICD-10-CM | POA: Diagnosis present

## 2020-06-16 LAB — COMPREHENSIVE METABOLIC PANEL
ALT: 21 U/L (ref 0–44)
AST: 24 U/L (ref 15–41)
Albumin: 2.4 g/dL — ABNORMAL LOW (ref 3.5–5.0)
Alkaline Phosphatase: 120 U/L (ref 38–126)
Anion gap: 16 — ABNORMAL HIGH (ref 5–15)
BUN: 41 mg/dL — ABNORMAL HIGH (ref 6–20)
CO2: 25 mmol/L (ref 22–32)
Calcium: 9.3 mg/dL (ref 8.9–10.3)
Chloride: 97 mmol/L — ABNORMAL LOW (ref 98–111)
Creatinine, Ser: 0.88 mg/dL (ref 0.44–1.00)
GFR, Estimated: >60 mL/min (ref >60)
Glucose, Bld: 125 mg/dL — ABNORMAL HIGH (ref 70–99)
Potassium: 2.8 mmol/L — ABNORMAL LOW (ref 3.5–5.1)
Sodium: 138 mmol/L (ref 135–145)
Total Bilirubin: 0.6 mg/dL (ref 0.3–1.2)
Total Protein: 6.6 g/dL (ref 6.5–8.1)

## 2020-06-16 LAB — LACTIC ACID, PLASMA
Lactic Acid, Venous: 1.6 mmol/L (ref 0.5–1.9)
Lactic Acid, Venous: 4.1 mmol/L (ref 0.5–1.9)

## 2020-06-16 LAB — I-STAT CHEM 8, ED
BUN: 35 mg/dL — ABNORMAL HIGH (ref 6–20)
Calcium, Ion: 1.17 mmol/L (ref 1.15–1.40)
Chloride: 99 mmol/L (ref 98–111)
Creatinine, Ser: 0.9 mg/dL (ref 0.44–1.00)
Glucose, Bld: 122 mg/dL — ABNORMAL HIGH (ref 70–99)
HCT: 34 % — ABNORMAL LOW (ref 36.0–46.0)
Hemoglobin: 11.6 g/dL — ABNORMAL LOW (ref 12.0–15.0)
Potassium: 2.8 mmol/L — ABNORMAL LOW (ref 3.5–5.1)
Sodium: 137 mmol/L (ref 135–145)
TCO2: 28 mmol/L (ref 22–32)

## 2020-06-16 LAB — CBC WITH DIFFERENTIAL/PLATELET
Abs Immature Granulocytes: 0.65 10*3/uL — ABNORMAL HIGH (ref 0.00–0.07)
Basophils Absolute: 0.1 10*3/uL (ref 0.0–0.1)
Basophils Relative: 0 %
Eosinophils Absolute: 0 10*3/uL (ref 0.0–0.5)
Eosinophils Relative: 0 %
HCT: 34.5 % — ABNORMAL LOW (ref 36.0–46.0)
Hemoglobin: 11.1 g/dL — ABNORMAL LOW (ref 12.0–15.0)
Immature Granulocytes: 3 %
Lymphocytes Relative: 9 %
Lymphs Abs: 2.1 10*3/uL (ref 0.7–4.0)
MCH: 25.7 pg — ABNORMAL LOW (ref 26.0–34.0)
MCHC: 32.2 g/dL (ref 30.0–36.0)
MCV: 79.9 fL — ABNORMAL LOW (ref 80.0–100.0)
Monocytes Absolute: 1.7 10*3/uL — ABNORMAL HIGH (ref 0.1–1.0)
Monocytes Relative: 7 %
Neutro Abs: 18.9 10*3/uL — ABNORMAL HIGH (ref 1.7–7.7)
Neutrophils Relative %: 81 %
Platelets: 459 10*3/uL — ABNORMAL HIGH (ref 150–400)
RBC: 4.32 MIL/uL (ref 3.87–5.11)
RDW: 19.9 % — ABNORMAL HIGH (ref 11.5–15.5)
WBC: 23.5 10*3/uL — ABNORMAL HIGH (ref 4.0–10.5)
nRBC: 0 % (ref 0.0–0.2)

## 2020-06-16 LAB — URINALYSIS, ROUTINE W REFLEX MICROSCOPIC
Bilirubin Urine: NEGATIVE
Glucose, UA: NEGATIVE mg/dL
Ketones, ur: NEGATIVE mg/dL
Nitrite: NEGATIVE
Protein, ur: NEGATIVE mg/dL
Specific Gravity, Urine: 1.025 (ref 1.005–1.030)
WBC, UA: >50 WBC/hpf — ABNORMAL HIGH (ref 0–5)
pH: 5 (ref 5.0–8.0)

## 2020-06-16 LAB — MAGNESIUM: Magnesium: 1.6 mg/dL — ABNORMAL LOW (ref 1.7–2.4)

## 2020-06-16 LAB — PROTIME-INR
INR: 1.1 (ref 0.8–1.2)
Prothrombin Time: 13.5 seconds (ref 11.4–15.2)

## 2020-06-16 LAB — POC OCCULT BLOOD, ED: Fecal Occult Bld: NEGATIVE

## 2020-06-16 LAB — APTT: aPTT: 39 seconds — ABNORMAL HIGH (ref 24–36)

## 2020-06-16 MED ORDER — POTASSIUM CHLORIDE 2 MEQ/ML IV SOLN
INTRAVENOUS | Status: DC
  Filled 2020-06-16 (×3): qty 1000

## 2020-06-16 MED ORDER — PIPERACILLIN-TAZOBACTAM 3.375 G IVPB 30 MIN: 3.3750 g | Freq: Four times a day (QID) | INTRAVENOUS | Status: DC

## 2020-06-16 MED ORDER — LACTATED RINGERS IV BOLUS
1000.0000 mL | Freq: Once | INTRAVENOUS | Status: AC
  Administered 2020-06-16: 1000 mL via INTRAVENOUS

## 2020-06-16 MED ORDER — NOREPINEPHRINE 4 MG/250ML-% IV SOLN
0.0000 ug/min | INTRAVENOUS | Status: DC
  Filled 2020-06-16: qty 250

## 2020-06-16 MED ORDER — HEPARIN SODIUM (PORCINE) 5000 UNIT/ML IJ SOLN
5000.0000 [IU] | Freq: Three times a day (TID) | INTRAMUSCULAR | Status: DC
  Administered 2020-06-17 – 2020-06-18 (×7): 5000 [IU] via SUBCUTANEOUS
  Filled 2020-06-16 (×8): qty 1

## 2020-06-16 MED ORDER — CHLORHEXIDINE GLUCONATE CLOTH 2 % EX PADS
6.0000 | MEDICATED_PAD | Freq: Every day | CUTANEOUS | Status: DC
  Administered 2020-06-17 – 2020-07-07 (×20): 6 via TOPICAL

## 2020-06-16 MED ORDER — LACTATED RINGERS IV BOLUS (SEPSIS)
1000.0000 mL | Freq: Once | INTRAVENOUS | Status: AC
  Administered 2020-06-16: 1000 mL via INTRAVENOUS

## 2020-06-16 MED ORDER — MAGNESIUM SULFATE 2 GM/50ML IV SOLN
2.0000 g | Freq: Once | INTRAVENOUS | Status: AC
  Administered 2020-06-16: 2 g via INTRAVENOUS
  Filled 2020-06-16: qty 50

## 2020-06-16 MED ORDER — MAGNESIUM SULFATE 2 GM/50ML IV SOLN
2.0000 g | Freq: Once | INTRAVENOUS | Status: AC
  Administered 2020-06-17: 2 g via INTRAVENOUS
  Filled 2020-06-16: qty 50

## 2020-06-16 MED ORDER — POTASSIUM CHLORIDE 10 MEQ/100ML IV SOLN
10.0000 meq | INTRAVENOUS | Status: AC
  Administered 2020-06-16 – 2020-06-17 (×4): 10 meq via INTRAVENOUS
  Filled 2020-06-16 (×4): qty 100

## 2020-06-16 MED ORDER — SODIUM CHLORIDE 0.9% FLUSH: 10.0000 mL | INTRAVENOUS | Status: DC | PRN

## 2020-06-16 MED ORDER — PIPERACILLIN-TAZOBACTAM 3.375 G IVPB 30 MIN
3.3750 g | Freq: Once | INTRAVENOUS | Status: AC
  Administered 2020-06-16: 3.375 g via INTRAVENOUS
  Filled 2020-06-16: qty 50

## 2020-06-16 MED ORDER — DOCUSATE SODIUM 100 MG PO CAPS: 100.0000 mg | ORAL_CAPSULE | Freq: Two times a day (BID) | ORAL | Status: DC | PRN

## 2020-06-16 MED ORDER — SODIUM CHLORIDE 0.9 % IV SOLN
250.0000 mL | INTRAVENOUS | Status: DC
  Administered 2020-06-17: 250 mL via INTRAVENOUS

## 2020-06-16 MED ORDER — VANCOMYCIN HCL 750 MG/150ML IV SOLN
750.0000 mg | Freq: Two times a day (BID) | INTRAVENOUS | Status: DC
  Administered 2020-06-17 – 2020-06-18 (×3): 750 mg via INTRAVENOUS
  Filled 2020-06-16 (×4): qty 150

## 2020-06-16 MED ORDER — VANCOMYCIN HCL 1500 MG/300ML IV SOLN
1500.0000 mg | Freq: Once | INTRAVENOUS | Status: AC
  Administered 2020-06-17: 1500 mg via INTRAVENOUS
  Filled 2020-06-16: qty 300

## 2020-06-16 MED ORDER — FENTANYL CITRATE (PF) 100 MCG/2ML IJ SOLN
50.0000 ug | Freq: Once | INTRAMUSCULAR | Status: AC
  Administered 2020-06-16: 50 ug via INTRAVENOUS
  Filled 2020-06-16: qty 2

## 2020-06-16 MED ORDER — LACTATED RINGERS IV SOLN: INTRAVENOUS | Status: AC

## 2020-06-16 MED ORDER — PANTOPRAZOLE SODIUM 40 MG IV SOLR
40.0000 mg | Freq: Once | INTRAVENOUS | Status: AC
  Administered 2020-06-16: 40 mg via INTRAVENOUS
  Filled 2020-06-16: qty 40

## 2020-06-16 MED ORDER — POLYETHYLENE GLYCOL 3350 17 G PO PACK: 17.0000 g | PACK | Freq: Every day | ORAL | Status: DC | PRN

## 2020-06-16 MED ORDER — PANTOPRAZOLE SODIUM 40 MG IV SOLR
40.0000 mg | Freq: Every day | INTRAVENOUS | Status: DC
  Administered 2020-06-17 – 2020-06-18 (×3): 40 mg via INTRAVENOUS
  Filled 2020-06-16 (×3): qty 40

## 2020-06-16 MED ORDER — SODIUM CHLORIDE 0.9 % IV SOLN
2.0000 g | INTRAVENOUS | Status: DC
  Filled 2020-06-16: qty 20

## 2020-06-16 MED ORDER — IOHEXOL 300 MG/ML  SOLN
100.0000 mL | Freq: Once | INTRAMUSCULAR | Status: AC | PRN
  Administered 2020-06-16: 100 mL via INTRAVENOUS

## 2020-06-16 NOTE — ED Triage Notes (Signed)
Pt arrived via EMS, from home, states worsening wound on buttocks that developed during last hospital stay that she has been following with wound clinic. Worsening fatigue that has kept her bed bound this week. Pt also endorses dark tarry stools since this weekend.

## 2020-06-16 NOTE — H&P (Incomplete)
Tina Griffith ZMO:[id number] DOB: 03-27-1961 DOA: 06/16/2020     PCP: [facility]   Outpatient Specialists: * NONE CARDS: * Dr. NEphrology: *  Dr. NEurology    Dr. Maple [facility] *  Dr.  Oncology * Dr. Sandria Manly* Dr.  Deboraha Sprang, [facility]) Urology Dr. *  Patient arrived to ER on 06/16/20 at 1459 Referred by Attending Tilden Fossa, MD   Patient coming from: home Lives alone,   *** With family From facility ***  Chief Complaint: *** Chief Complaint  Patient presents with  . Wound Infection  . Fatigue    HPI: Tina Griffith is a 60 y.o. 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  Urine incontinence.  Multiple sclerosis, GERD, HTN, HLD    Presented with   Sacral wound developed since December admited to [place] [facility] in December for covid and respiratory failure  1 wk of severe pain surounding the ulcer area, lives with son  Has 5h care per day Wound has foul discharge No fever Also reported back stool  For the past few days but  Hemoccult neg  Infectious risk factors:  Reports*** fever, shortness of breath, dry cough, chest pain, Sore throat, URI symptoms, anosmia/change in taste, N/V/Diarrhea/abdominal pain,  Body aches, severe fatigue Reports known sick contacts, known COVID 19 exposure, inability to completely isolate   ***KNOWN COVID POSITIVE   Has ***NOt been vaccinated against COVID    Initial COVID TEST  NEGATIVE**** POSITIVE,  ***in house  PCR testing  Pending  No results found for: SARSCOV2NAA   Regarding pertinent Chronic problems: ***  ****Hyperlipidemia - *on statins Lipid Panel  No results found for: CHOL, TRIG, HDL, CHOLHDL, VLDL, LDLCALC, LDLDIRECT, LABVLDL   HTN on hydrochlorothiazide  ***chronic CHF diastolic/systolic/ combined - last echo***  *** CAD  - On Aspirin, statin, betablocker, Plavix                  - *followed by cardiology                - last cardiac cath  The ASCVD Risk score Denman George DC Jr., et al., 2013) failed to calculate for the following reasons:   The patient has a prior MI or stroke diagnosis    ***DM 2 - No results found for: HGBA1C ****on insulin, PO meds only, diet controlled  ***Hypothyroidism: No results found for: TSH on synthroid  *** Morbid obesity-   BMI Readings from Last 1 Encounters:  04/08/19 30.10 kg/m     *** Asthma -well *** controlled on home inhalers/ nebs f                        ***last no prior***admission  ***                       No ***history of intubation  *** COPD - not **followed by pulmonology *** not  on baseline oxygen  *L,    *** OSA -on nocturnal oxygen, *CPAP, *noncompliant with CPAP  *** Hx of CVA - *with/out residual deficits on Aspirin 81 mg, 325, Plavix  ***A. Fib -  - CHA2DS2 vas score **** CHA2DS2/VAS Stroke Risk Points      N/A >= 2 Points: High Risk  1 - 1.99 Points: Medium Risk  0 Points: Low Risk    Last Change: N/A  This score determines the patient's risk of having a stroke if the  patient has atrial fibrillation.      This score is not applicable to this patient. Components are not  calculated.     current  on anticoagulation with ****Coumadin  ***Xarelto,* Eliquis,  *** Not on anticoagulation secondary to Risk of Falls, *** recurrent bleeding         -  Rate control:  Currently controlled with ***Toprolol,  *Metoprolol,* Diltiazem, *Coreg          - Rhythm control: *** amiodarone, *flecainide  ***CKD stage III*- baseline Cr **** CrCl cannot be calculated (Unknown ideal weight.).  Lab Results  Component Value Date   CREATININE 0.90 06/16/2020   CREATININE 0.88 06/16/2020   CREATININE 1.10 (H) 10/16/2014     **** Liver disease MELD-Na score: 7 at 06/16/2020  3:53 PM MELD score: 7 at 06/16/2020  3:53 PM Calculated from: Serum Creatinine: 0.90 mg/dL (Using min of 1 mg/dL) at 06/16/2020  3:53  PM Serum Sodium: 137 mmol/L at 06/16/2020  3:53 PM Total Bilirubin: 0.6 mg/dL (Using min of 1 mg/dL) at 06/16/2020  3:32 PM INR(ratio): 1.1 at 06/16/2020  3:32 PM Age: 69 years   ***BPH - on Flomax, Proscar    *** Dementia - on Aricept** Nemenda  *** Chronic anemia - baseline hg Hemoglobin & Hematocrit  Recent Labs    06/16/20 1532 06/16/20 1553  HGB 11.1* 11.6*     While in ER: Have gotten IV fluids Zosyn/ vanc   ER Provider Called:     DrMarland Kitchen  They Recommend admit to medicine *** Will see in AM*  in ER  Hospitalist was called for admission for ***  The following Work up has been ordered so far:  Orders Placed This Encounter  Procedures  . Blood Culture (routine x 2)  . Urine culture  . DG Chest Port 1 View  . CT Abdomen Pelvis W Contrast  . Lactic acid, plasma  . Comprehensive metabolic panel  . CBC WITH DIFFERENTIAL  . Protime-INR  . APTT  . Urinalysis, Routine w reflex microscopic  . Magnesium  . Diet NPO time specified  . Cardiac monitoring  . Document height and weight  . Assess and Document Glasgow Coma Scale  . Document vital signs within 1-hour of fluid bolus completion. Notify provider of abnormal vital signs despite fluid resuscitation.  . DO NOT delay antibiotics if unable to obtain blood culture.  . Refer to Sidebar Report: Sepsis Sidebar ED/IP  . Notify provider for difficulties obtaining IV access.  . Insert peripheral IV x 2  . Initiate Carrier Fluid Protocol  . Consult to hospitalist  ALL PATIENTS BEING ADMITTED/HAVING PROCEDURES NEED COVID-19 SCREENING  . Consult to Transition of Care  . Consult to hospitalist  ALL PATIENTS BEING ADMITTED/HAVING PROCEDURES NEED COVID-19 SCREENING  . Code Sepsis activation.  This occurs automatically when order is signed and prioritizes pharmacy, lab, and radiology services for STAT collections and interventions.  If [facility] downtime, call [facility] [phone]) to activate Code Sepsis.  . Pulse oximetry, continuous   . I-Stat Chem 8, ED  . POC occult blood, ED  . POC occult blood, ED  . ED EKG 12-Lead    Following Medications were ordered in ER: Medications  lactated ringers infusion ( Intravenous New Bag/Given 06/16/20 1547)  lactated ringers bolus 1,000 mL (0 mLs Intravenous Stopped 06/16/20 1653)  piperacillin-tazobactam (ZOSYN) IVPB 3.375 g (0 g Intravenous Stopped 06/16/20 1633)  lactated ringers bolus  1,000 mL (1,000 mLs Intravenous Bolus 06/16/20 1735)  pantoprazole (PROTONIX) injection 40 mg (40 mg Intravenous Given 06/16/20 1735)  magnesium sulfate IVPB 2 g 50 mL (0 g Intravenous Stopped 06/16/20 1932)  iohexol (OMNIPAQUE) 300 MG/ML solution 100 mL (100 mLs Intravenous Contrast Given 06/16/20 1914)        Consult Orders  (From admission, onward)         Start     Ordered   06/16/20 1915  Consult to hospitalist  ALL PATIENTS BEING ADMITTED/HAVING PROCEDURES NEED COVID-19 SCREENING  Once       Comments: ALL PATIENTS BEING ADMITTED/HAVING PROCEDURES NEED COVID-19 SCREENING  Provider:  (Not yet assigned)  Question Answer Comment  Place call to: [facility]   Reason for Consult Admit      06/16/20 1914   06/16/20 1730  Consult to hospitalist  ALL PATIENTS BEING ADMITTED/HAVING PROCEDURES NEED COVID-19 SCREENING  Once       Comments: ALL PATIENTS BEING ADMITTED/HAVING PROCEDURES NEED COVID-19 SCREENING  Provider:  (Not yet assigned)  Question Answer Comment  Place call to: [facility]   Reason for Consult Admit      06/16/20 1729          Significant initial  Findings: Abnormal Labs Reviewed  LACTIC ACID, PLASMA - Abnormal; Notable for the following components:      Result Value   Lactic Acid, Venous 4.1 (*)    All other components within normal limits  COMPREHENSIVE METABOLIC PANEL - Abnormal; Notable for the following components:   Potassium 2.8 (*)    Chloride 97 (*)    Glucose, Bld 125 (*)    BUN 41 (*)    Albumin 2.4 (*)    Anion gap 16 (*)    All other  components within normal limits  CBC WITH DIFFERENTIAL/PLATELET - Abnormal; Notable for the following components:   WBC 23.5 (*)    Hemoglobin 11.1 (*)    HCT 34.5 (*)    MCV 79.9 (*)    MCH 25.7 (*)    RDW 19.9 (*)    Platelets 459 (*)    Neutro Abs 18.9 (*)    Monocytes Absolute 1.7 (*)    Abs Immature Granulocytes 0.65 (*)    All other components within normal limits  APTT - Abnormal; Notable for the following components:   aPTT 39 (*)    All other components within normal limits  MAGNESIUM - Abnormal; Notable for the following components:   Magnesium 1.6 (*)    All other components within normal limits  I-STAT CHEM 8, ED - Abnormal; Notable for the following components:   Potassium 2.8 (*)    BUN 35 (*)    Glucose, Bld 122 (*)    Hemoglobin 11.6 (*)    HCT 34.0 (*)    All other components within normal limits    Otherwise labs showing:   Recent Labs  Lab 06/16/20 1532 06/16/20 1553 06/16/20 1640  NA 138 137  --   K 2.8* 2.8*  --   CO2 25  --   --   GLUCOSE 125* 122*  --   BUN 41* 35*  --   CREATININE 0.88 0.90  --   CALCIUM 9.3  --   --   MG  --   --  1.6*    Cr  stable,    Lab Results  Component Value Date   CREATININE 0.90 06/16/2020   CREATININE 0.88 06/16/2020   CREATININE 1.10 (H)  10/16/2014    Recent Labs  Lab 06/16/20 1532  AST 24  ALT 21  ALKPHOS 120  BILITOT 0.6  PROT 6.6  ALBUMIN 2.4*   Lab Results  Component Value Date   CALCIUM 9.3 06/16/2020     WBC      Component Value Date/Time   WBC 23.5 (H) 06/16/2020 1532   LYMPHSABS 2.1 06/16/2020 1532   LYMPHSABS 2.5 09/16/2015 1347   MONOABS 1.7 (H) 06/16/2020 1532   EOSABS 0.0 06/16/2020 1532   EOSABS 0.2 09/16/2015 1347   BASOSABS 0.1 06/16/2020 1532   BASOSABS 0.0 09/16/2015 1347     Plt: Lab Results  Component Value Date   PLT 459 (H) 06/16/2020     Lactic Acid, Venous    Component Value Date/Time   LATICACIDVEN 4.1 ([place]) 06/16/2020 1850   @  (RESUTFAST[lacticacid:4)@  Procalcitonin *** Ordered   COVID-19 Labs  No results for input(s): DDIMER, FERRITIN, LDH, CRP in the last 72 hours.  No results found for: SARSCOV2NAA    Arterial ***Venous  Blood Gas result:  pH *** pCO2 ***; pO2 ***;     %O2 Sat ***.  ABG    Component Value Date/Time   TCO2 28 06/16/2020 1553      HG/HCT * stable,  Down *Up from baseline see below    Component Value Date/Time   HGB 11.6 (L) 06/16/2020 1553   HGB 12.7 09/16/2015 1347   HCT 34.0 (L) 06/16/2020 1553   HCT 38.1 09/16/2015 1347   MCV 79.9 (L) 06/16/2020 1532   MCV 90 09/16/2015 1347    No results for input(s): LIPASE, AMYLASE in the last 168 hours. No results for input(s): AMMONIA in the last 168 hours.    Troponin ***ordered Cardiac Panel (last 3 results) No results for input(s): CKTOTAL, CKMB, TROPONINI, RELINDX in the last 72 hours.     ECG: Ordered Personally reviewed by me showing: HR : *** Rhythm: *NSR, Sinus tachycardia * A.fib. W RVR, RBBB, LBBB, Paced Ischemic changes*nonspecific changes, no evidence of ischemic changes QTC*   BNP (last 3 results) No results for input(s): BNP in the last 8760 hours.    DM  labs:  HbA1C: No results for input(s): HGBA1C in the last 8760 hours.     CBG (last 3)  No results for input(s): GLUCAP in the last 72 hours.     UA *** no evidence of UTI  ***Pending ***not ordered   Urine analysis:    Component Value Date/Time   COLORURINE YELLOW 10/16/2014 [place] 10/16/2014 1355   APPEARANCEUR Clear 07/25/2014 1100   LABSPEC 1.017 10/16/2014 1355   PHURINE 6.0 10/16/2014 1355   GLUCOSEU NEGATIVE 10/16/2014 1355   HGBUR NEGATIVE 10/16/2014 [place] 10/16/2014 1355   BILIRUBINUR Negative 07/25/2014 1100   KETONESUR NEGATIVE 10/16/2014 1355   PROTEINUR NEGATIVE 10/16/2014 1355   UROBILINOGEN 0.2 10/16/2014 1355   NITRITE NEGATIVE 10/16/2014 1355   LEUKOCYTESUR NEGATIVE 10/16/2014  1355   LEUKOCYTESUR Negative 07/25/2014 1100       Ordered  CT HEAD *** NON acute  CXR - ***NON acute  CTabd/pelvis - large sacral wound  CTA chest - ***nonacute, no PE, * no evidence of infiltrate     ED Triage Vitals [06/16/20 1511]  Enc Vitals Group     BP 112/65     Pulse Rate 96     Resp 20     Temp 98.3 F (36.8 C)  Temp Source Oral     SpO2 96 %     Weight      Height      Head Circumference      Peak Flow      Pain Score      Pain Loc      Pain Edu?      Excl. in [place]?   [id number]       Latest  Blood pressure (!) 132/48, pulse (!) 125, temperature 98.3 F (36.8 C), temperature source Oral, resp. rate 19, SpO2 98 %.    Review of Systems:    Pertinent positives include: ***  Constitutional:  No weight loss, night sweats, Fevers, chills, fatigue, weight loss  HEENT:  No headaches, Difficulty swallowing,Tooth/dental problems,Sore throat,  No sneezing, itching, ear ache, nasal congestion, post nasal drip,  Cardio-vascular:  No chest pain, Orthopnea, PND, anasarca, dizziness, palpitations.no Bilateral lower extremity swelling  GI:  No heartburn, indigestion, abdominal pain, nausea, vomiting, diarrhea, change in bowel habits, loss of appetite, melena, blood in stool, hematemesis Resp:  no shortness of breath at rest. No dyspnea on exertion, No excess mucus, no productive cough, No non-productive cough, No coughing up of blood.No change in color of mucus.No wheezing. Skin:  no rash or lesions. No jaundice GU:  no dysuria, change in color of urine, no urgency or frequency. No straining to urinate.  No flank pain.  Musculoskeletal:  No joint pain or no joint swelling. No decreased range of motion. No back pain.  Psych:  No change in mood or affect. No depression or anxiety. No memory loss.  Neuro: no localizing neurological complaints, no tingling, no weakness, no double vision, no gait abnormality, no slurred speech, no confusion  All systems reviewed  and apart from [place] all are negative  Past Medical History:   Past Medical History:  Diagnosis Date  . Constipation   . Hypertension   . Movement disorder   . MS (multiple sclerosis) ([place])   . Vision abnormalities      Past Surgical History:  Procedure Laterality Date  . COLOSTOMY    . COLOSTOMY REVERSAL    . HERNIA REPAIR    . KNEE SURGERY      Social History:  Ambulatory *** independently cane, walker  wheelchair bound, bed bound     reports that she has been smoking cigarettes. She has been smoking about 1.00 pack per day. She has never used smokeless tobacco. She reports that she does not drink alcohol and does not use drugs.    Family History:   Family History  Problem Relation Age of Onset  . Lung cancer Mother   . Stroke Mother   . Diabetes Father     Allergies: Allergies  Allergen Reactions  . No Known Allergies      Prior to Admission medications   Medication Sig Start Date End Date Taking? Authorizing Provider  Ascorbic Acid (VITAMIN C) 1000 MG tablet Take 1,000 mg by mouth in the morning and at bedtime. 05/21/20  Yes [provider]  ASPIRIN LOW DOSE 81 MG EC tablet Take 81 mg by mouth daily. 06/10/20  Yes [provider]  benzonatate (TESSALON) 100 MG capsule Take 100 mg by mouth 3 (three) times daily. 05/18/20  Yes [provider]  dalfampridine 10 MG TB12 Take 1 tablet (10 mg total) by mouth 2 (two) times daily. 08/25/16  Yes Sater, Nanine Means, MD  FEROSUL 325 (65 Fe) MG tablet Take 325 mg by mouth  2 (two) times daily. 05/18/20  Yes [provider]  hydrochlorothiazide (HYDRODIURIL) 25 MG tablet Take 25 mg by mouth daily.   Yes [provider]  liraglutide (VICTOZA) 18 MG/3ML SOPN Inject 0.6 mg into the skin daily.   Yes [provider]  LIVALO 2 MG TABS Take 1 tablet by mouth daily. 06/05/20  Yes [provider]  metoprolol succinate (TOPROL-XL) 100 MG 24 hr tablet Take 100 mg by mouth  daily. 05/19/20  Yes [provider]  Misc Natural Products (AIRBORNE ELDERBERRY) CHEW Chew 2 tablets by mouth daily.   Yes [provider]  Multiple Vitamin (MULTIVITAMIN ADULT) TABS Take 1 tablet by mouth daily.   Yes [provider]  omeprazole (PRILOSEC) 20 MG capsule Take 20 mg by mouth daily.   Yes [provider]  oxybutynin (DITROPAN-XL) 10 MG 24 hr tablet Take 10 mg by mouth daily. 06/11/20  Yes [provider]  oxyCODONE-acetaminophen (PERCOCET) 10-325 MG tablet Take 1 tablet by mouth 4 (four) times daily. 05/21/20  Yes [provider]  potassium chloride (MICRO-K) 10 MEQ CR capsule Take 10 mEq by mouth 2 (two) times daily. 05/19/20  Yes [provider]  tiZANidine (ZANAFLEX) 4 MG tablet TAKE TWO (2) TABLETS THREE (3) TIMES DAILY Patient taking differently: Take 8 mg by mouth 3 (three) times daily. 03/05/16  Yes Sater, Nanine Means, MD  baclofen (LIORESAL) 10 MG tablet TAKE ONE TABLET FOUR TIMES DAILY 04/20/16   Sater, Nanine Means, MD  CALMOSEPTINE 0.44-20.6 % OINT Apply topically 3 (three) times daily as needed. 06/10/20   [provider]  Cholecalciferol (VITAMIN D3) 50 MCG (2000 UT) TABS Take 1 tablet by mouth daily. 05/21/20   [provider]  citalopram (CELEXA) 20 MG tablet TAKE 1 TABLET DAILY Patient not taking: Reported on 04/20/2016 05/22/15   Sater, Nanine Means, MD  cyclobenzaprine (FLEXERIL) 5 MG tablet Take 1 tablet (5 mg total) by mouth at bedtime. 04/20/16   Sater, Nanine Means, MD  DULoxetine (CYMBALTA) 60 MG capsule Take 1 capsule (60 mg total) by mouth daily. 04/20/16   Sater, Nanine Means, MD  ENSURE Randall Hiss) Frequency:   Dosage:0     Instructions:  Note:One can by mouth twice a day 08/08/13   [provider]  fluticasone (FLONASE) 50 MCG/ACT nasal spray ONE TO TWO SPRAYS EACH NOSTRIL EVERY MORNING AS NEEDED 10/09/14 10/10/15  [provider]  furosemide (LASIX) 20 MG tablet Take 20 mg by  mouth daily as needed. 01/12/20   [provider]  furosemide (LASIX) 40 MG tablet Take 40 mg by mouth daily.    [provider]  gabapentin (NEURONTIN) 300 MG capsule Take 1 capsule (300 mg total) by mouth 3 (three) times daily. 03/09/16 03/10/17  Sater, Nanine Means, MD  HYDROcodone-acetaminophen (NORCO) 10-325 MG tablet Take one tablet 4 times daily as needed. 05/26/16   Sater, Nanine Means, MD  Linaclotide Rolan Lipa) 145 MCG CAPS capsule Take by mouth. 10/09/14 04/07/15  [provider]  Lubiprostone (AMITIZA PO) Take by mouth.    [provider]  MAGNESIUM-OXIDE 400 (241.3 Mg) MG tablet Take 1 tablet by mouth 3 (three) times daily. 03/20/20   [provider]  methylPREDNISolone (MEDROL DOSEPAK) 4 MG TBPK tablet Take 6 tablets on day 1, 5 tablets on day 2, 4 tablets on day 3, 3 tablets on day 4, 2 tablets on day 5, and 1 tablet on day 6. 06/01/16   Sater, Nanine Means, MD  morphine (MSIR)  30 MG tablet Take 1 tablet (30 mg total) by mouth 2 (two) times daily. 05/26/16   Sater, Nanine Means, MD  Naloxone HCl 0.4 MG/0.4ML SOAJ Use as directed if unable to arouse the patient 01/16/16   Britt Bottom, MD  nicotine (RA NICOTINE) 14 mg/24hr patch Place onto the skin. Reported on 08/15/2015    [provider]  Ocrelizumab 300 MG/10ML SOLN Inject 600 mg into the vein every 6 (six) months.    [provider]  omeprazole (PRILOSEC) 20 MG capsule TAKE 1 CAPSULE DAILY 30 MINUTES BEFORE BREAKFAST 07/12/14 01/08/15  [provider]  oxybutynin (DITROPAN) 5 MG tablet TAKE 2 TABLETS 3 TIMES A DAY. IF CAUSES DRY MOUTH DECREASE TO 1 TABLET 3 TIMES A DAY 09/29/16   Sater, Nanine Means, MD  polyethylene glycol (MIRALAX / GLYCOLAX) packet Take 17 g by mouth daily. 07/25/14   Sater, Nanine Means, MD  potassium chloride (K-DUR) 10 MEQ tablet Take by mouth. 10/09/14 04/07/15  [provider]  sulfamethoxazole-trimethoprim (BACTRIM DS,SEPTRA DS) 800-160 MG tablet Take 1  tablet by mouth 2 (two) times daily. 04/08/16   Sater, Nanine Means, MD  SYMBICORT 160-4.5 MCG/ACT inhaler SMARTSIG:2 Puff(s) By Mouth Twice Daily 05/21/20   [provider]  traMADol (ULTRAM) 50 MG tablet Take 1 tablet 3 times daily as needed for pain. 05/26/16   Sater, Nanine Means, MD  TRULANCE 3 MG TABS Take 1 tablet by mouth daily. 05/20/20   [provider]  venlafaxine XR (EFFEXOR-XR) 37.5 MG 24 hr capsule  05/26/12   [provider]  zinc gluconate 50 MG tablet Take 50 mg by mouth daily. 05/21/20   [provider]   Physical Exam: Vitals with BMI 06/16/2020 06/16/2020 06/16/2020  Height - - -  Weight - - -  BMI - - -  Systolic Q000111Q [id number]  Diastolic 48 Q000111Q [id number]  Pulse 125 124 121    1. General:  in No ***Acute distress***increased work of breathing ***complaining of severe pain****agitated * Chronically ill *well *cachectic *toxic acutely ill -appearing 2. Psychological: Alert and *** Oriented 3. Head/ENT:   Moist *** Dry Mucous Membranes                          Head Non traumatic, neck supple                          Normal *** Poor Dentition 4. SKIN: normal *** decreased Skin turgor,  Skin clean Dry and intact no rash 5. Heart: Regular rate and rhythm no*** Murmur, no Rub or gallop 6. Lungs: ***Clear to auscultation bilaterally, no wheezes or crackles   7. Abdomen: Soft, ***non-tender, Non distended *** obese ***bowel sounds present 8. Lower extremities: no clubbing, cyanosis, no ***edema 9. Neurologically Grossly intact, moving all 4 extremities equally *** strength 5 out of 5 in all 4 extremities cranial nerves II through XII intact 10. MSK: Normal range of motion   All other LABS:     Recent Labs  Lab 06/16/20 1532 06/16/20 1553  WBC 23.5*  --   NEUTROABS 18.9*  --   HGB 11.1* 11.6*  HCT 34.5* 34.0*  MCV 79.9*  --   PLT 459*  --      Recent Labs  Lab 06/16/20 1532 06/16/20 1553 06/16/20 1640  NA 138 137  --   K 2.8* 2.8*  --    CL 97* 99  --  CO2 25  --   --   GLUCOSE 125* 122*  --   BUN 41* 35*  --   CREATININE 0.88 0.90  --   CALCIUM 9.3  --   --   MG  --   --  1.6*     Recent Labs  Lab 06/16/20 1532  AST 24  ALT 21  ALKPHOS 120  BILITOT 0.6  PROT 6.6  ALBUMIN 2.4*   Cultures:    Component Value Date/Time   SDES URINE, CATHETERIZED 10/16/2014 1355   SPECREQUEST NONE 10/16/2014 1355   CULT NO GROWTH Performed at [facility]  10/16/2014 1355   REPTSTATUS 10/17/2014 FINAL 10/16/2014 1355     Radiological Exams on Admission: CT Abdomen Pelvis W Contrast  Result Date: 06/16/2020 CLINICAL DATA:  Sepsis, sacral wound. EXAM: CT ABDOMEN AND PELVIS WITH CONTRAST TECHNIQUE: Multidetector CT imaging of the abdomen and pelvis was performed using the standard protocol following bolus administration of intravenous contrast. CONTRAST:  129mL OMNIPAQUE IOHEXOL 300 MG/ML  SOLN COMPARISON:  None. FINDINGS: Lower chest: No acute abnormality. Hepatobiliary: No focal hepatic abnormality. Gallbladder unremarkable. Pancreas: No focal abnormality or ductal dilatation. Spleen: No focal abnormality.  Normal size. Adrenals/Urinary Tract: No adrenal abnormality. No focal renal abnormality. No stones or hydronephrosis. Urinary bladder is unremarkable. Stomach/Bowel: Large stool burden in the rectosigmoid colon. No evidence of bowel obstruction. Vascular/Lymphatic: Aortic atherosclerosis. No evidence of aneurysm or adenopathy. Reproductive: Fibroid uterus, unchanged.  No adnexal mass. Other: No free fluid or free air. Musculoskeletal: Large open sacral decubitus ulcer/wound noted. This extends to the coccyx without definite acute osteomyelitis. IMPRESSION: Large sacral wound extends down to the coccyx without definitive change of acute osteomyelitis. Large stool burden in the rectosigmoid colon concerning for fecal impaction. Fibroid uterus. Aortic atherosclerosis. Electronically Signed   By: Rolm Baptise M.D.   On:  06/16/2020 19:23   DG Chest Port 1 View  Result Date: 06/16/2020 CLINICAL DATA:  Possible sepsis. EXAM: PORTABLE CHEST 1 VIEW COMPARISON:  05/13/2020 FINDINGS: 1530 hours. The lungs are clear without focal pneumonia, edema, pneumothorax or pleural effusion. Linear atelectasis or scarring noted right base. Interstitial markings are diffusely coarsened with chronic features. The cardiopericardial silhouette is within normal limits for size. The visualized bony structures of the thorax show no acute abnormality. IMPRESSION: No active disease. Electronically Signed   By: Misty Stanley M.D.   On: 06/16/2020 15:52    Chart has been reviewed    Assessment/Plan   60 y.o. 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  Urine incontinence.  Multiple sclerosis, GERD, HTN, HLD  Admitted for ***  Present on Admission: . Multiple sclerosis ([place]) . Hypomagnesemia . Hemiplegia, spastic ([place]) . Essential (primary) hypertension . Anterior optic neuritis . Chronic kidney disease . Decubital ulcer . Leucocytosis . Severe sepsis ([place])   Other plan as per orders.  DVT prophylaxis:  SCD *** Lovenox       Code Status:    Code Status: Not on file FULL CODE *** DNR/DNI ***comfort care as per patient ***family  I had personally discussed CODE STATUS with patient and family* I had spent *min discussing goals of care and CODE STATUS    Family Communication:   Family not at  Bedside  plan of care was discussed on the phone with *** Son, Daughter, Wife, Husband, Sister, Brother , father, mother  Disposition  Plan:   *** likely will need placement for rehabilitation                          Back to current facility when stable                            To home once workup is complete and patient is stable  ***Following barriers for discharge:                             Electrolytes corrected                               Anemia corrected                             Pain controlled with PO medications                               Afebrile, white count improving able to transition to PO antibiotics                             Will need to be able to tolerate PO                            Will likely need home health, home O2, set up                           Will need consultants to evaluate patient prior to discharge  ****EXPECT DC tomorrow                    ***Would benefit from PT/OT eval prior to DC  Ordered                   Swallow eval - SLP ordered                   Diabetes care coordinator                   Transition of care consulted                   Nutrition    consulted                  Wound care  consulted                   Palliative care    consulted                   Behavioral health  consulted                    Consults called: ***    Admission status:  ED Disposition    ED Disposition Condition Comment   Admit  The patient appears reasonably stabilized for admission considering the current resources, flow, and capabilities available in the ED at this time, and I doubt any other [facility] requiring further screening and/or treatment in the ED prior to admission is  present.  Obs***  ***  inpatient     I Expect 2 midnight stay secondary to severity of patient's current illness need for inpatient interventions justified by the following: ***hemodynamic instability despite optimal treatment (tachycardia *hypotension * tachypnea *hypoxia, hypercapnia) * Severe lab/radiological/exam abnormalities including:     and extensive comorbidities including: *substance abuse  *Chronic pain *DM2  * CHF * CAD  * COPD/asthma *Morbid Obesity * CKD *dementia *liver disease *history of stroke with residual deficits *  malignancy, * sickle cell disease  . History of amputation . Chronic anticoagulation  That are currently  affecting medical management.   I expect  patient to be hospitalized for 2 midnights requiring inpatient medical care.  Patient is at high risk for adverse outcome (such as loss of life or disability) if not treated.  Indication for inpatient stay as follows:  Severe change from baseline regarding mental status Hemodynamic instability despite maximal medical therapy,  ongoing suicidal ideations,  severe pain requiring acute inpatient management,  inability to maintain oral hydration   persistent chest pain despite medical management Need for operative/procedural  intervention New or worsening hypoxia  Need for IV antibiotics, IV fluids, IV rate controling medications, IV antihypertensives, IV pain medications, IV anticoagulation    Level of care   *** tele  For 12H 24H     medical floor       SDU tele indefinitely please discontinue once patient no longer qualifies COVID-19 Labs    No results found for: [id number]   Precautions: admitted as *** Covid Negative  ***asymptomatic screening protocol****PUI *** covid positive No active isolations ***If Covid PCR is negative  - please DC precautions - would need additional investigation given very high risk for false native test result   PPE: Used by the provider:   N95***P100  eye Goggles,  Gloves ***gown        Tina Griffith 06/16/2020, 8:46 PM ***  [facility]     after 2 AM please page floor coverage PA If 7AM-7PM, please contact the day team taking care of the patient using [email or web address]   Patient was evaluated in the context of the global COVID-19 pandemic, which necessitated consideration that the patient might be at risk for infection with the SARS-CoV-2 virus that causes COVID-19. Institutional protocols and algorithms that pertain to the evaluation of patients at risk for COVID-19 are in a state of rapid change based on information released by regulatory bodies including the CDC and federal and state  organizations. These policies and algorithms were followed during the patient's care.

## 2020-06-16 NOTE — Progress Notes (Signed)
Pharmacy Antibiotic Note  Tina Griffith is a 60 y.o. female admitted on 06/16/2020 with cellulitis. Noted to have large sacral decubitus ulcer with necrotic tissue. Pharmacy has been consulted for Vancomycin dosing.  NCrCl ~52ml/min  Plan: Rocephin per MD Vancomycin 1500mg  IV x1 then 750mg  IV q12h to target AUC 400-550 Check Vancomycin levels at steady state Monitor renal function and cx data       Temp (24hrs), Avg:98.3 F (36.8 C), Min:98.3 F (36.8 C), Max:98.3 F (36.8 C)  Recent Labs  Lab 06/16/20 1532 06/16/20 1553 06/16/20 1850  WBC 23.5*  --   --   CREATININE 0.88 0.90  --   LATICACIDVEN 1.6  --  4.1*    CrCl cannot be calculated (Unknown ideal weight.).    Allergies  Allergen Reactions  . No Known Allergies     Antimicrobials this admission: 1/24 Zosyn x1 in ED 1/24 Vanc >>  1/24 Rocephin >>   Dose adjustments this admission:  Microbiology results: 1/24 BCx:  1/24 UCx:    Thank you for allowing pharmacy to be a part of this patient's care.  Netta Cedars PharmD, BCPS 06/16/2020 9:06 PM

## 2020-06-16 NOTE — Progress Notes (Signed)
elink following for sepsis protocol  

## 2020-06-16 NOTE — ED Notes (Signed)
Second IV attempted by multiple RNs. IV team consult placed.

## 2020-06-16 NOTE — ED Provider Notes (Signed)
[facility] DEPT Provider Note   CSN: [id number] Arrival date & time: 06/16/20  1459     History Chief Complaint  Patient presents with  . Wound Infection  . Fatigue    Tina Griffith is a 60 y.o. female.  The history is provided by the patient and medical records.   Tina Griffith is a 60 y.o. female who presents to the Emergency Department complaining of wound infection. She presents the emergency department by EMS from home for evaluation of gluteal wound. She has a history of MS with left hemiplegia was recently admitted to [facility] for COVID infection with respiratory failure. She was discharged home on December 26 and at the time of discharge she had a sacral wound. She states that prior to her hospitalization she was able to ambulate with a walker but since discharge home she has been bedbound.  On record review pt with decubitus ulcer prior to prior hospitalization.  Over the last week she has experienced increased pain in the sacral region that is currently quite significant. She also reports over the last few days having 2 to 3 black bowel movements daily. She denies any fevers, chest pain, shortness of breath, cough, nausea, vomiting. No prior similar symptoms. She lives at home with her son does have a caregiver that helps her for about five hours daily. No prior similar symptoms.    Past Medical History:  Diagnosis Date  . Constipation   . Hypertension   . Movement disorder   . MS (multiple sclerosis) ([place])   . Vision abnormalities     Patient Active Problem List   Diagnosis Date Noted  . Leucocytosis 06/16/2020  . Septic shock ([place]) 06/16/2020  . UTI (urinary tract infection) 06/16/2020  . Depression with anxiety 04/20/2016  . Insomnia 04/20/2016  . Chronic prescription opiate use 01/16/2016  . Hypertriglyceridemia 06/20/2015  . Abnormal urine odor 10/09/2014  . Chronic kidney disease 10/09/2014  .  Elevated WBC count 10/09/2014  . Multiple sclerosis ([place]) 07/25/2014  . Spastic gait 07/25/2014  . Leg pain, left 07/25/2014  . Urinary frequency 07/25/2014  . Constipation 07/25/2014  . Decreased potassium in the blood 04/22/2014  . Hypomagnesemia 04/22/2014  . Ataxic gait 02/28/2014  . Callosity 02/28/2014  . 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 Contracture of ankle and foot joint 02/28/2014  . Decubital ulcer 02/28/2014  . Fibroid 02/28/2014  . Dysfunctional or functional uterine hemorrhage 02/28/2014  . Incisional hernia with obstruction but no gangrene 02/28/2014  . Gonalgia 02/28/2014  . Extremity pain 02/28/2014  . Decreased motor strength 02/28/2014  . Non-pressure ulcer of lower extremity ([place]) 02/28/2014  . Loss of feeling or sensation 02/28/2014  . Anterior optic neuritis 02/28/2014  . Hemorrhage, postmenopausal 02/28/2014  . Arthritis of knee, degenerative 02/28/2014  . AS (sickle cell trait) ([place]) 02/28/2014  . Hemiplegia, spastic ([place]) 02/28/2014  . Sickle cell trait ([place]) 02/28/2014  . Spastic hemiplegia ([place]) 02/28/2014  . Abnormal results of thyroid function studies 12/28/2013  . Allergic rhinitis 08/22/2013  . Disorder of magnesium metabolism 08/22/2013  . Essential (primary) hypertension 08/22/2013  . Hypercholesteremia 08/22/2013  . Adult hypothyroidism 08/22/2013  . Anemia, iron deficiency 08/22/2013  . Compulsive tobacco user syndrome 08/22/2013  . Absence of bladder continence 08/22/2013  . Hypercholesterolemia 08/22/2013  . Current tobacco use 08/22/2013  . Accumulation of fluid in tissues 11/29/2012  . Dermatophytic onychia 10/28/2012  . Colon, diverticulosis 10/28/2012  . Polypharmacy 10/28/2012  . DS (  disseminated sclerosis) ([place]) 10/28/2012  . Arthralgia of lower leg 10/28/2012  . Menopausal symptom 10/28/2012  . Current tear of lateral cartilage or meniscus of knee 10/28/2012  . Diverticular disease of large intestine 10/28/2012   . Other long term (current) drug therapy 10/28/2012  . Abnormal uterine bleeding 06/26/2012  . Baseball finger 08/03/2010    Past Surgical History:  Procedure Laterality Date  . COLOSTOMY    . COLOSTOMY REVERSAL    . HERNIA REPAIR    . KNEE SURGERY       OB History   No obstetric history on file.     Family History  Problem Relation Age of Onset  . Lung cancer Mother   . Stroke Mother   . Diabetes Father     Social History   Tobacco Use  . Smoking status: Current Every Day Smoker    Packs/day: 1.00    Types: Cigarettes  . Smokeless tobacco: Never Used  Substance Use Topics  . Alcohol use: No    Alcohol/week: 0.0 standard drinks  . Drug use: No    Home Medications Prior to Admission medications   Medication Sig Start Date End Date Taking? Authorizing Provider  Ascorbic Acid (VITAMIN C) 1000 MG tablet Take 1,000 mg by mouth in the morning and at bedtime. 05/21/20  Yes [provider]  ASPIRIN LOW DOSE 81 MG EC tablet Take 81 mg by mouth daily. 06/10/20  Yes [provider]  benzonatate (TESSALON) 100 MG capsule Take 100 mg by mouth 3 (three) times daily. 05/18/20  Yes [provider]  dalfampridine 10 MG TB12 Take 1 tablet (10 mg total) by mouth 2 (two) times daily. 08/25/16  Yes Sater, Nanine Means, MD  FEROSUL 325 (65 Fe) MG tablet Take 325 mg by mouth 2 (two) times daily. 05/18/20  Yes [provider]  hydrochlorothiazide (HYDRODIURIL) 25 MG tablet Take 25 mg by mouth daily.   Yes [provider]  liraglutide (VICTOZA) 18 MG/3ML SOPN Inject 0.6 mg into the skin daily.   Yes [provider]  LIVALO 2 MG TABS Take 1 tablet by mouth daily. 06/05/20  Yes [provider]  metoprolol succinate (TOPROL-XL) 100 MG 24 hr tablet Take 100 mg by mouth daily. 05/19/20  Yes [provider]  Misc Natural Products (AIRBORNE ELDERBERRY) CHEW Chew 2 tablets by mouth daily.   Yes [provider]  Multiple  Vitamin (MULTIVITAMIN ADULT) TABS Take 1 tablet by mouth daily.   Yes [provider]  omeprazole (PRILOSEC) 20 MG capsule Take 20 mg by mouth daily.   Yes [provider]  oxybutynin (DITROPAN-XL) 10 MG 24 hr tablet Take 10 mg by mouth daily. 06/11/20  Yes [provider]  oxyCODONE-acetaminophen (PERCOCET) 10-325 MG tablet Take 1 tablet by mouth 4 (four) times daily. 05/21/20  Yes [provider]  potassium chloride (MICRO-K) 10 MEQ CR capsule Take 10 mEq by mouth 2 (two) times daily. 05/19/20  Yes [provider]  tiZANidine (ZANAFLEX) 4 MG tablet TAKE TWO (2) TABLETS THREE (3) TIMES DAILY Patient taking differently: Take 8 mg by mouth 3 (three) times daily. 03/05/16  Yes Sater, Nanine Means, MD  zinc gluconate 50 MG tablet Take 50 mg by mouth daily. 05/21/20  Yes [provider]  baclofen (LIORESAL) 10 MG tablet TAKE ONE TABLET FOUR TIMES DAILY Patient not taking: No sig reported 04/20/16   Sater, Nanine Means, MD  CALMOSEPTINE 0.44-20.6 % OINT Apply 1 application topically 3 (three) times  daily as needed (skin irritation). 06/10/20   [provider]  citalopram (CELEXA) 20 MG tablet TAKE 1 TABLET DAILY Patient not taking: No sig reported 05/22/15   Sater, Nanine Means, MD  cyclobenzaprine (FLEXERIL) 5 MG tablet Take 1 tablet (5 mg total) by mouth at bedtime. Patient not taking: No sig reported 04/20/16   Sater, Nanine Means, MD  DULoxetine (CYMBALTA) 60 MG capsule Take 1 capsule (60 mg total) by mouth daily. Patient not taking: No sig reported 04/20/16   Sater, Nanine Means, MD  gabapentin (NEURONTIN) 300 MG capsule Take 1 capsule (300 mg total) by mouth 3 (three) times daily. 03/09/16 03/10/17  Sater, Nanine Means, MD  HYDROcodone-acetaminophen (NORCO) 10-325 MG tablet Take one tablet 4 times daily as needed. Patient not taking: No sig reported 05/26/16   Sater, Nanine Means, MD  methylPREDNISolone (MEDROL DOSEPAK) 4 MG TBPK tablet Take 6 tablets on day  1, 5 tablets on day 2, 4 tablets on day 3, 3 tablets on day 4, 2 tablets on day 5, and 1 tablet on day 6. Patient not taking: No sig reported 06/01/16   Sater, Nanine Means, MD  morphine (MSIR) 30 MG tablet Take 1 tablet (30 mg total) by mouth 2 (two) times daily. Patient not taking: No sig reported 05/26/16   Britt Bottom, MD  Naloxone HCl 0.4 MG/0.4ML SOAJ Use as directed if unable to arouse the patient Patient not taking: No sig reported 01/16/16   Sater, Nanine Means, MD  Ocrelizumab 300 MG/10ML SOLN Inject 600 mg into the vein every 6 (six) months.    [provider]  omeprazole (PRILOSEC) 20 MG capsule TAKE 1 CAPSULE DAILY 30 MINUTES BEFORE BREAKFAST 07/12/14 01/08/15  [provider]  oxybutynin (DITROPAN) 5 MG tablet TAKE 2 TABLETS 3 TIMES A DAY. IF CAUSES DRY MOUTH DECREASE TO 1 TABLET 3 TIMES A DAY Patient not taking: No sig reported 09/29/16   Sater, Nanine Means, MD  polyethylene glycol (MIRALAX / GLYCOLAX) packet Take 17 g by mouth daily. Patient not taking: No sig reported 07/25/14   Sater, Nanine Means, MD  sulfamethoxazole-trimethoprim (BACTRIM DS,SEPTRA DS) 800-160 MG tablet Take 1 tablet by mouth 2 (two) times daily. Patient not taking: No sig reported 04/08/16   Britt Bottom, MD  SYMBICORT 160-4.5 MCG/ACT inhaler Inhale 2 puffs into the lungs daily. 05/21/20   [provider]  traMADol (ULTRAM) 50 MG tablet Take 1 tablet 3 times daily as needed for pain. Patient not taking: No sig reported 05/26/16   Sater, Nanine Means, MD  TRULANCE 3 MG TABS Take 1 tablet by mouth daily. 05/20/20   [provider]    Allergies    No known allergies  Review of Systems   Review of Systems  All other systems reviewed and are negative.   Physical Exam Updated Vital Signs BP 126/73 (BP Location: Left Arm)   Pulse (!) 128   Temp 98.3 F (36.8 C) (Oral)   Resp (!) 23   SpO2 95%   Physical Exam Vitals and nursing note reviewed.  Constitutional:      Appearance: She  is well-developed and well-nourished.  HENT:     Head: Normocephalic and atraumatic.  Cardiovascular:     Rate and Rhythm: Regular rhythm. Tachycardia present.  Pulmonary:     Effort: Pulmonary effort is normal. No respiratory distress.  Abdominal:     Palpations: Abdomen is soft.     Tenderness: There is no abdominal tenderness. There is no  guarding or rebound.  Genitourinary:    Comments: Large sacral unstageable decubitus ulcer with foul-smelling necrotic tissue. Black stool in rectal vault Musculoskeletal:        General: No tenderness or edema.  Skin:    General: Skin is dry.     Comments: Cool extremities  Neurological:     Mental Status: She is alert and oriented to person, place, and time.     Comments: 0/5 strength in the left lower extremity. 3 to 5 strength in the right lower extremity. 4/5 strength and bilateral upper extremities.  Psychiatric:        Mood and Affect: Mood and affect normal.        Behavior: Behavior normal.     ED Results / Procedures / Treatments   Labs (all labs ordered are listed, but only abnormal results are displayed) Labs Reviewed  LACTIC ACID, PLASMA - Abnormal; Notable for the following components:      Result Value   Lactic Acid, Venous 4.1 (*)    All other components within normal limits  COMPREHENSIVE METABOLIC PANEL - Abnormal; Notable for the following components:   Potassium 2.8 (*)    Chloride 97 (*)    Glucose, Bld 125 (*)    BUN 41 (*)    Albumin 2.4 (*)    Anion gap 16 (*)    All other components within normal limits  CBC WITH DIFFERENTIAL/PLATELET - Abnormal; Notable for the following components:   WBC 23.5 (*)    Hemoglobin 11.1 (*)    HCT 34.5 (*)    MCV 79.9 (*)    MCH 25.7 (*)    RDW 19.9 (*)    Platelets 459 (*)    Neutro Abs 18.9 (*)    Monocytes Absolute 1.7 (*)    Abs Immature Granulocytes 0.65 (*)    All other components within normal limits  APTT - Abnormal; Notable for the following components:   aPTT  39 (*)    All other components within normal limits  URINALYSIS, ROUTINE W REFLEX MICROSCOPIC - Abnormal; Notable for the following components:   APPearance CLOUDY (*)    Hgb urine dipstick MODERATE (*)    Leukocytes,Ua LARGE (*)    WBC, UA >50 (*)    Bacteria, UA RARE (*)    All other components within normal limits  MAGNESIUM - Abnormal; Notable for the following components:   Magnesium 1.6 (*)    All other components within normal limits  I-STAT CHEM 8, ED - Abnormal; Notable for the following components:   Potassium 2.8 (*)    BUN 35 (*)    Glucose, Bld 122 (*)    Hemoglobin 11.6 (*)    HCT 34.0 (*)    All other components within normal limits  CULTURE, BLOOD (ROUTINE X 2)  CULTURE, BLOOD (ROUTINE X 2)  URINE CULTURE  LACTIC ACID, PLASMA  PROTIME-INR  LACTIC ACID, PLASMA  LACTIC ACID, PLASMA  PROCALCITONIN  POC OCCULT BLOOD, ED  POC OCCULT BLOOD, ED  TROPONIN I (HIGH SENSITIVITY)  TROPONIN I (HIGH SENSITIVITY)    EKG EKG Interpretation  Date/Time:  Monday June 16 2020 15:49:30 EST Ventricular Rate:  112 PR Interval:    QRS Duration: 139 QT Interval:  354 QTC Calculation: 484 R Axis:   -68 Text Interpretation: Sinus tachycardia Right bundle branch block Left ventricular hypertrophy No previous tracing Confirmed by Quintella Reichert [id number]) on 06/16/2020 4:00:24 PM   Radiology CT Abdomen Pelvis W Contrast  Result Date:  06/16/2020 CLINICAL DATA:  Sepsis, sacral wound. EXAM: CT ABDOMEN AND PELVIS WITH CONTRAST TECHNIQUE: Multidetector CT imaging of the abdomen and pelvis was performed using the standard protocol following bolus administration of intravenous contrast. CONTRAST:  144mL OMNIPAQUE IOHEXOL 300 MG/ML  SOLN COMPARISON:  None. FINDINGS: Lower chest: No acute abnormality. Hepatobiliary: No focal hepatic abnormality. Gallbladder unremarkable. Pancreas: No focal abnormality or ductal dilatation. Spleen: No focal abnormality.  Normal size. Adrenals/Urinary Tract:  No adrenal abnormality. No focal renal abnormality. No stones or hydronephrosis. Urinary bladder is unremarkable. Stomach/Bowel: Large stool burden in the rectosigmoid colon. No evidence of bowel obstruction. Vascular/Lymphatic: Aortic atherosclerosis. No evidence of aneurysm or adenopathy. Reproductive: Fibroid uterus, unchanged.  No adnexal mass. Other: No free fluid or free air. Musculoskeletal: Large open sacral decubitus ulcer/wound noted. This extends to the coccyx without definite acute osteomyelitis. IMPRESSION: Large sacral wound extends down to the coccyx without definitive change of acute osteomyelitis. Large stool burden in the rectosigmoid colon concerning for fecal impaction. Fibroid uterus. Aortic atherosclerosis. Electronically Signed   By: Rolm Baptise M.D.   On: 06/16/2020 19:23   DG Chest Port 1 View  Result Date: 06/16/2020 CLINICAL DATA:  Possible sepsis. EXAM: PORTABLE CHEST 1 VIEW COMPARISON:  05/13/2020 FINDINGS: 1530 hours. The lungs are clear without focal pneumonia, edema, pneumothorax or pleural effusion. Linear atelectasis or scarring noted right base. Interstitial markings are diffusely coarsened with chronic features. The cardiopericardial silhouette is within normal limits for size. The visualized bony structures of the thorax show no acute abnormality. IMPRESSION: No active disease. Electronically Signed   By: Misty Stanley M.D.   On: 06/16/2020 15:52    Procedures Procedures  CRITICAL CARE Performed by: Quintella Reichert   Total critical care time: 45 minutes  Critical care time was exclusive of separately billable procedures and treating other patients.  Critical care was necessary to treat or prevent imminent or life-threatening deterioration.  Critical care was time spent personally by me on the following activities: development of treatment plan with patient and/or surrogate as well as nursing, discussions with consultants, evaluation of patient's response to  treatment, examination of patient, obtaining history from patient or surrogate, ordering and performing treatments and interventions, ordering and review of laboratory studies, ordering and review of radiographic studies, pulse oximetry and re-evaluation of patient's condition.  Medications Ordered in ED Medications  lactated ringers infusion ( Intravenous New Bag/Given 06/16/20 1547)  potassium chloride 10 mEq in 100 mL IVPB (10 mEq Intravenous New Bag/Given 06/16/20 2112)  cefTRIAXone (ROCEPHIN) 2 g in sodium chloride 0.9 % 100 mL IVPB (has no administration in time range)  vancomycin (VANCOREADY) IVPB 1500 mg/300 mL (has no administration in time range)  norepinephrine (LEVOPHED) 4mg  in 265mL premix infusion (has no administration in time range)  vancomycin (VANCOREADY) IVPB 750 mg/150 mL (has no administration in time range)  magnesium sulfate IVPB 2 g 50 mL (has no administration in time range)  sodium chloride flush (NS) 0.9 % injection 10-40 mL (has no administration in time range)  Chlorhexidine Gluconate Cloth 2 % PADS 6 each (has no administration in time range)  lactated ringers bolus 1,000 mL (0 mLs Intravenous Stopped 06/16/20 1653)  piperacillin-tazobactam (ZOSYN) IVPB 3.375 g (0 g Intravenous Stopped 06/16/20 1633)  lactated ringers bolus 1,000 mL (1,000 mLs Intravenous Bolus 06/16/20 1735)  pantoprazole (PROTONIX) injection 40 mg (40 mg Intravenous Given 06/16/20 1735)  magnesium sulfate IVPB 2 g 50 mL (0 g Intravenous Stopped 06/16/20 1932)  iohexol (OMNIPAQUE) 300 MG/ML  solution 100 mL (100 mLs Intravenous Contrast Given 06/16/20 1914)  fentaNYL (SUBLIMAZE) injection 50 mcg (50 mcg Intravenous Given 06/16/20 2108)  lactated ringers bolus 1,000 mL (1,000 mLs Intravenous New Bag/Given (Non-Interop) 06/16/20 2112)    ED Course  I have reviewed the triage vital signs and the nursing notes.  Pertinent labs & imaging results that were available during my care of the patient were reviewed  by me and considered in my medical decision making (see chart for details).    MDM Rules/Calculators/A&P                         Pt with hx/o MS, left hemiparesis and recent covid 19 infection here for evaluation of sacral pain, black stools.  On presentation patient tachycardic. Examination with chronic decubitus wound. Presentation is not consistent with necrotizing fasciitis. She was treated with IV fluid resuscitation, antibiotics. Initial lactic acid 1.6. Labs significant for leukocytosis compared to recent hospital stay with elevation and BUN and stable hemoglobin. CT abdomen pelvis obtained, which demonstrates no evidence of deep tissue space infection. Initial plan to consult hospitals for admission but on repeat lactic acid level significantly increased to 4.1 and patient has worsening tachycardia despite IV fluid resuscitation. She did have transient hypotension, which resolved without intervention. A code sepsis was activated with this change in her status. She received 30 mL per kilo of fluids. On repeat assessment she is awake, alert, complains of feeling unwell. Critical care consulted for admission for sepsis.  Final Clinical Impression(s) / ED Diagnoses Final diagnoses:  Pressure injury of sacral region, unstageable ([place])  Sepsis with acute organ dysfunction without septic shock, due to unspecified organism, unspecified type [facility])    Rx / DC Orders ED Discharge Orders    None       Quintella Reichert, MD 06/16/20 2325

## 2020-06-16 NOTE — Progress Notes (Signed)
Communication took place with ED RN with need for 2nd lactic

## 2020-06-16 NOTE — H&P (Signed)
NAME:  Tina Griffith MRN:  [id number] DOB:  10-30-1960 LOS: 0 ADMISSION DATE:  06/16/2020 DATE OF SERVICE:  06/16/2020  CHIEF COMPLAINT:  Failure to thrive   HISTORY & PHYSICAL  History of Present Illness  This 60 y.o. African-American female presented to the [facility] Emergency Department via EMS with complaints of failure to thrive and worsening of gluteal/sacral pressure sores.  The patient's daughter is at the bedside and is able to help in providing medical history.  The patient was recently discharged from a hospitalization for treatment of COVID-19 pneumonia at the end of December 2021, during which time she developed her pressure sores.  She was discharged home but has experienced generalized fatigue, anorexia and increasing lower extremity edema.  She has a history of multiple sclerosis, which has her confined to a wheelchair for mobility.  At baseline, she is able to bear weight with the assistance of a walker; however, she is not able to ambulate.  She has a dense, left lower extremity motor deficit due to her multiple sclerosis.  REVIEW OF SYSTEMS  Constitutional: No weight loss. No night sweats. No fever. No chills. No fatigue. HEENT: No headaches, dysphagia, sore throat, otalgia, nasal congestion, PND CV:  No chest pain, orthopnea, PND, swelling in lower extremities, palpitations GI: Anorexia.  Black tarry stools.  No abdominal pain, nausea, vomiting. Resp: No DOE, rest dyspnea, cough, mucus, hemoptysis, wheezing  GU: no dysuria, change in color of urine, no urgency or frequency.  No flank pain. MS: Multiple sclerosis.  Left lower extremity paresis.  No joint pain or swelling. No myalgias.   Psych:  No change in mood or affect. No memory loss. Skin: Sacral/gluteal pressure sores.   Past Medical/Surgical/Social/Family History   Past Medical History:  Diagnosis Date  . Constipation   . Hypertension   . Movement disorder   . MS (multiple sclerosis)  ([place])   . Vision abnormalities     Past Surgical History:  Procedure Laterality Date  . COLOSTOMY    . COLOSTOMY REVERSAL    . HERNIA REPAIR    . KNEE SURGERY      Social History   Tobacco Use  . Smoking status: Current Every Day Smoker    Packs/day: 1.00    Types: Cigarettes  . Smokeless tobacco: Never Used  Substance Use Topics  . Alcohol use: No    Alcohol/week: 0.0 standard drinks    Family History  Problem Relation Age of Onset  . Lung cancer Mother   . Stroke Mother   . Diabetes Father      Procedures:  N/A   Significant Diagnostic Tests:  CT abdomen/pelvis (1/24) demonstrates sacral decubitus with involvement down to coccyx but without overt signs of acute osteomyelitis.  12-lead EKG (1/24 1549) shows sinus tachycardia with right bundle branch block.  Nonspecific T wave changes in I, aVL.   Micro Data:   Results for orders placed or performed during the hospital encounter of 10/16/14  Urine culture     Status: None   Collection Time: 10/16/14  1:55 PM   Specimen: Urine, Catheterized  Result Value Ref Range Status   Specimen Description URINE, CATHETERIZED  Final   Special Requests NONE  Final   Colony Count NO GROWTH Performed at [facility]   Final   Culture NO GROWTH Performed at [facility]   Final   Report Status 10/17/2014 FINAL  Final      Antimicrobials:  Zosyn (1/24)  Rocephin (1/24>>) Vancomycin (1/24>>)   Interim history/subjective:  N/A   Objective   BP 126/73 (BP Location: Left Arm)   Pulse (!) 128   Temp 98.3 F (36.8 C) (Oral)   Resp (!) 23   SpO2 95%     There were no vitals filed for this visit. No intake or output data in the 24 hours ending 06/16/20 2302      Examination: GENERAL: alert, oriented to time, person and place, normal mood, behavior, speech and thought processes, affect appropriate to mood, pleasant or well-developed. No acute distress. HEAD: normocephalic, atraumatic EYE: OD  exotropia.  PERRLA, EOM intact, no scleral icterus, no pallor. THROAT/ORAL CAVITY: No oral thrush. No exudate. Mucous membranes are moist. No tonsillar enlargement.  NECK: supple, no thyromegaly, no JVD, no lymphadenopathy. Trachea midline. CHEST/LUNG: symmetric in development and expansion. Good air entry. No crackles. No wheezes. HEART: Tachycardia.  Regular S1 and S2 without murmur, rub or gallop. ABDOMEN: soft, nontender, nondistended. Normoactive bowel sounds. No rebound. No guarding. No hepatosplenomegaly. EXTREMITIES: Edema: Trace. No cyanosis.  No clubbing. 2+ DP pulses LYMPHATIC: no cervical/axillary/inguinal lymph nodes appreciated MUSCULOSKELETAL: No point tenderness.  No bulk atrophy. Joints: Normal inspection.  SKIN: Sacral pressure sore, stage III by Inspection. NEUROLOGIC: Doll's eyes intact. Corneal reflex intact. Spontaneous respirations intact. Cranial nerves II-XII are grossly symmetric and physiologic. Babinski absent. No sensory deficit. Motor: 5/5 @ RUE, 4/5 @ LUE, 5/5 @ RLL,  0/5 @ LLL.  Resting tremor most notable in the right upper extremity. Gait was not assessed.   Resolved Hospital Problem list   N/A   Assessment & Plan:   ASSESSMENT/PLAN:  ASSESSMENT (included in the Hospital Problem List)  Principal Problem:   Septic shock ([place]) Active Problems:   Multiple sclerosis ([place])   Decubital ulcer   UTI (urinary tract infection)   Chronic kidney disease   Hypomagnesemia   Anterior optic neuritis   Essential (primary) hypertension   Hemiplegia, spastic (HCC)   Leucocytosis   By systems: PULMONARY  No acute issues   CARDIOVASCULAR  Abnormal EKG Check troponin.   RENAL  No acute issues  GASTROINTESTINAL  Black tarry stools   GI PROPHYLAXIS: Protonix Check Hemoccult.   HEMATOLOGIC  Normocytic anemia  DVT PROPHYLAXIS: Heparin   INFECTIOUS  Septic shock  Urinary tract infection  Sacral decubitus Titrate Levophed for MAP 65+.  Wean  off as tolerated.   Continue Zosyn/vancomycin. Follow-up blood cultures. Have spoken to patient about potential role for CVC or PICC line and even the potential for needing a diverting colostomy in the future (she has had one in the past, reversed).  IV team was able to obtain peripheral access and the patient appears to be clinically improving (BP) and may even be able to avoid norepinephrine infusion.   ENDOCRINE  No acute issues   NEUROLOGIC  Multiple sclerosis   PLAN/RECOMMENDATIONS   Admit to ICU under my service (Attending: Renee Pain, MD) with the diagnoses highlighted above in the active Hospital Problem List (ASSESSMENT).  IV fluids: Continue LR  Meds: See above.  Labs: See above  NUTRITION: Clear liquids.  Advance to cardiac diet as tolerated.    My assessment, plan of care, findings, medications, side effects, etc. were discussed with: nurse, Dr. Ralene Bathe (Emergency medicine) and patient's next of kin (answered all questions to his/her satisfaction).   Best practice:  Diet: Clear liquids, advance as tolerated to cardiac Pain/Anxiety/Delirium protocol (if indicated): Not applicable VAP protocol (if indicated): Not applicable DVT  prophylaxis: Heparin GI prophylaxis: Protonix Glucose control: Not applicable Mobility/Activity: Bedrest CODE STATUS:   Code Status: Not on file Family Communication:  patient's family (Daughter) Disposition: Admit to ICU   Labs   CBC: Recent Labs  Lab 06/16/20 1532 06/16/20 1553  WBC 23.5*  --   NEUTROABS 18.9*  --   HGB 11.1* 11.6*  HCT 34.5* 34.0*  MCV 79.9*  --   PLT 459*  --     Basic Metabolic Panel: Recent Labs  Lab 06/16/20 1532 06/16/20 1553 06/16/20 1640  NA 138 137  --   K 2.8* 2.8*  --   CL 97* 99  --   CO2 25  --   --   GLUCOSE 125* 122*  --   BUN 41* 35*  --   CREATININE 0.88 0.90  --   CALCIUM 9.3  --   --   MG  --   --  1.6*   GFR: CrCl cannot be calculated (Unknown ideal weight.). Recent  Labs  Lab 06/16/20 1532 06/16/20 1850  WBC 23.5*  --   LATICACIDVEN 1.6 4.1*    Liver Function Tests: Recent Labs  Lab 06/16/20 1532  AST 24  ALT 21  ALKPHOS 120  BILITOT 0.6  PROT 6.6  ALBUMIN 2.4*   No results for input(s): LIPASE, AMYLASE in the last 168 hours. No results for input(s): AMMONIA in the last 168 hours.  ABG    Component Value Date/Time   TCO2 28 06/16/2020 1553     Coagulation Profile: Recent Labs  Lab 06/16/20 1532  INR 1.1    Cardiac Enzymes: No results for input(s): CKTOTAL, CKMB, CKMBINDEX, TROPONINI in the last 168 hours.  HbA1C: No results found for: HGBA1C  CBG: No results for input(s): GLUCAP in the last 168 hours.   Past Medical History   Past Medical History:  Diagnosis Date  . Constipation   . Hypertension   . Movement disorder   . MS (multiple sclerosis) (HCC)   . Vision abnormalities       Surgical History    Past Surgical History:  Procedure Laterality Date  . COLOSTOMY    . COLOSTOMY REVERSAL    . HERNIA REPAIR    . KNEE SURGERY        Social History   Social History   Socioeconomic History  . Marital status: Single    Spouse name: Not on file  . Number of children: Not on file  . Years of education: Not on file  . Highest education level: Not on file  Occupational History  . Not on file  Tobacco Use  . Smoking status: Current Every Day Smoker    Packs/day: 1.00    Types: Cigarettes  . Smokeless tobacco: Never Used  Substance and Sexual Activity  . Alcohol use: No    Alcohol/week: 0.0 standard drinks  . Drug use: No  . Sexual activity: Not on file  Other Topics Concern  . Not on file  Social History Narrative  . Not on file   Social Determinants of Health   Financial Resource Strain: Not on file  Food Insecurity: Not on file  Transportation Needs: Not on file  Physical Activity: Not on file  Stress: Not on file  Social Connections: Not on file      Family History    Family  History  Problem Relation Age of Onset  . Lung cancer Mother   . Stroke Mother   . Diabetes Father  family history includes Diabetes in her father; Lung cancer in her mother; Stroke in her mother.    Allergies Allergies  Allergen Reactions  . No Known Allergies       Current Medications  Current Facility-Administered Medications:  .  cefTRIAXone (ROCEPHIN) 2 g in sodium chloride 0.9 % 100 mL IVPB, 2 g, Intravenous, Q24H, Doutova, Anastassia, MD .  lactated ringers infusion, , Intravenous, Continuous, Quintella Reichert, MD, Last Rate: 150 mL/hr at 06/16/20 1547, New Bag at 06/16/20 1547 .  magnesium sulfate IVPB 2 g 50 mL, 2 g, Intravenous, Once, Renee Pain, MD .  norepinephrine (LEVOPHED) 4mg  in 286mL premix infusion, 0-40 mcg/min, Intravenous, Continuous, Quintella Reichert, MD .  potassium chloride 10 mEq in 100 mL IVPB, 10 mEq, Intravenous, Q1 Hr x 4, Doutova, Anastassia, MD, Last Rate: 100 mL/hr at 06/16/20 2112, 10 mEq at 06/16/20 2112 .  vancomycin (VANCOREADY) IVPB 1500 mg/300 mL, 1,500 mg, Intravenous, Once, Thomes Lolling, RPH .  [START ON 06/17/2020] vancomycin (VANCOREADY) IVPB 750 mg/150 mL, 750 mg, Intravenous, Q12H, [place], Baltazar Najjar, [facility]  Current Outpatient Medications:  .  Ascorbic Acid (VITAMIN C) 1000 MG tablet, Take 1,000 mg by mouth in the morning and at bedtime., Disp: , Rfl:  .  ASPIRIN LOW DOSE 81 MG EC tablet, Take 81 mg by mouth daily., Disp: , Rfl:  .  benzonatate (TESSALON) 100 MG capsule, Take 100 mg by mouth 3 (three) times daily., Disp: , Rfl:  .  dalfampridine 10 MG TB12, Take 1 tablet (10 mg total) by mouth 2 (two) times daily., Disp: 60 tablet, Rfl: 11 .  FEROSUL 325 (65 Fe) MG tablet, Take 325 mg by mouth 2 (two) times daily., Disp: , Rfl:  .  hydrochlorothiazide (HYDRODIURIL) 25 MG tablet, Take 25 mg by mouth daily., Disp: , Rfl:  .  liraglutide (VICTOZA) 18 MG/3ML SOPN, Inject 0.6 mg into the skin daily., Disp: , Rfl:  .  LIVALO 2 MG  TABS, Take 1 tablet by mouth daily., Disp: , Rfl:  .  metoprolol succinate (TOPROL-XL) 100 MG 24 hr tablet, Take 100 mg by mouth daily., Disp: , Rfl:  .  Misc Natural Products (AIRBORNE ELDERBERRY) CHEW, Chew 2 tablets by mouth daily., Disp: , Rfl:  .  Multiple Vitamin (MULTIVITAMIN ADULT) TABS, Take 1 tablet by mouth daily., Disp: , Rfl:  .  omeprazole (PRILOSEC) 20 MG capsule, Take 20 mg by mouth daily., Disp: , Rfl:  .  oxybutynin (DITROPAN-XL) 10 MG 24 hr tablet, Take 10 mg by mouth daily., Disp: , Rfl:  .  oxyCODONE-acetaminophen (PERCOCET) 10-325 MG tablet, Take 1 tablet by mouth 4 (four) times daily., Disp: , Rfl:  .  potassium chloride (MICRO-K) 10 MEQ CR capsule, Take 10 mEq by mouth 2 (two) times daily., Disp: , Rfl:  .  tiZANidine (ZANAFLEX) 4 MG tablet, TAKE TWO (2) TABLETS THREE (3) TIMES DAILY (Patient taking differently: Take 8 mg by mouth 3 (three) times daily.), Disp: 180 tablet, Rfl: 5 .  zinc gluconate 50 MG tablet, Take 50 mg by mouth daily., Disp: , Rfl:  .  baclofen (LIORESAL) 10 MG tablet, TAKE ONE TABLET FOUR TIMES DAILY (Patient not taking: No sig reported), Disp: 120 each, Rfl: 11 .  CALMOSEPTINE 0.44-20.6 % OINT, Apply 1 application topically 3 (three) times daily as needed (skin irritation)., Disp: , Rfl:  .  citalopram (CELEXA) 20 MG tablet, TAKE 1 TABLET DAILY (Patient not taking: No sig reported), Disp: 30 tablet, Rfl: 6 .  cyclobenzaprine (FLEXERIL) 5 MG tablet, Take 1 tablet (5 mg total) by mouth at bedtime. (Patient not taking: No sig reported), Disp: 30 tablet, Rfl: 5 .  DULoxetine (CYMBALTA) 60 MG capsule, Take 1 capsule (60 mg total) by mouth daily. (Patient not taking: No sig reported), Disp: 30 capsule, Rfl: 11 .  gabapentin (NEURONTIN) 300 MG capsule, Take 1 capsule (300 mg total) by mouth 3 (three) times daily., Disp: 90 capsule, Rfl: 11 .  HYDROcodone-acetaminophen (NORCO) 10-325 MG tablet, Take one tablet 4 times daily as needed. (Patient not taking: No sig  reported), Disp: 120 tablet, Rfl: 0 .  methylPREDNISolone (MEDROL DOSEPAK) 4 MG TBPK tablet, Take 6 tablets on day 1, 5 tablets on day 2, 4 tablets on day 3, 3 tablets on day 4, 2 tablets on day 5, and 1 tablet on day 6. (Patient not taking: No sig reported), Disp: 21 tablet, Rfl: 0 .  morphine (MSIR) 30 MG tablet, Take 1 tablet (30 mg total) by mouth 2 (two) times daily. (Patient not taking: No sig reported), Disp: 60 tablet, Rfl: 0 .  Naloxone HCl 0.4 MG/0.4ML SOAJ, Use as directed if unable to arouse the patient (Patient not taking: No sig reported), Disp: 0.4 mL, Rfl: 2 .  Ocrelizumab 300 MG/10ML SOLN, Inject 600 mg into the vein every 6 (six) months., Disp: , Rfl:  .  omeprazole (PRILOSEC) 20 MG capsule, TAKE 1 CAPSULE DAILY 30 MINUTES BEFORE BREAKFAST, Disp: , Rfl:  .  oxybutynin (DITROPAN) 5 MG tablet, TAKE 2 TABLETS 3 TIMES A DAY. IF CAUSES DRY MOUTH DECREASE TO 1 TABLET 3 TIMES A DAY (Patient not taking: No sig reported), Disp: 180 tablet, Rfl: 11 .  polyethylene glycol (MIRALAX / GLYCOLAX) packet, Take 17 g by mouth daily. (Patient not taking: No sig reported), Disp: 30 each, Rfl: 5 .  sulfamethoxazole-trimethoprim (BACTRIM DS,SEPTRA DS) 800-160 MG tablet, Take 1 tablet by mouth 2 (two) times daily. (Patient not taking: No sig reported), Disp: 14 tablet, Rfl: 0 .  SYMBICORT 160-4.5 MCG/ACT inhaler, Inhale 2 puffs into the lungs daily., Disp: , Rfl:  .  traMADol (ULTRAM) 50 MG tablet, Take 1 tablet 3 times daily as needed for pain. (Patient not taking: No sig reported), Disp: 90 tablet, Rfl: 2 .  TRULANCE 3 MG TABS, Take 1 tablet by mouth daily., Disp: , Rfl:    Home Medications  Prior to Admission medications   Medication Sig Start Date End Date Taking? Authorizing Provider  Ascorbic Acid (VITAMIN C) 1000 MG tablet Take 1,000 mg by mouth in the morning and at bedtime. 05/21/20  Yes [provider]  ASPIRIN LOW DOSE 81 MG EC tablet Take 81 mg by mouth daily. 06/10/20  Yes [provider]  benzonatate (TESSALON) 100 MG capsule Take 100 mg by mouth 3 (three) times daily. 05/18/20  Yes [provider]  dalfampridine 10 MG TB12 Take 1 tablet (10 mg total) by mouth 2 (two) times daily. 08/25/16  Yes Sater, Nanine Means, MD  FEROSUL 325 (65 Fe) MG tablet Take 325 mg by mouth 2 (two) times daily. 05/18/20  Yes [provider]  hydrochlorothiazide (HYDRODIURIL) 25 MG tablet Take 25 mg by mouth daily.   Yes [provider]  liraglutide (VICTOZA) 18 MG/3ML SOPN Inject 0.6 mg into the skin daily.   Yes [provider]  LIVALO 2 MG TABS Take 1 tablet by mouth daily. 06/05/20  Yes [provider]  metoprolol succinate (TOPROL-XL) 100 MG 24 hr tablet  Take 100 mg by mouth daily. 05/19/20  Yes [provider]  Misc Natural Products (AIRBORNE ELDERBERRY) CHEW Chew 2 tablets by mouth daily.   Yes [provider]  Multiple Vitamin (MULTIVITAMIN ADULT) TABS Take 1 tablet by mouth daily.   Yes [provider]  omeprazole (PRILOSEC) 20 MG capsule Take 20 mg by mouth daily.   Yes [provider]  oxybutynin (DITROPAN-XL) 10 MG 24 hr tablet Take 10 mg by mouth daily. 06/11/20  Yes [provider]  oxyCODONE-acetaminophen (PERCOCET) 10-325 MG tablet Take 1 tablet by mouth 4 (four) times daily. 05/21/20  Yes [provider]  potassium chloride (MICRO-K) 10 MEQ CR capsule Take 10 mEq by mouth 2 (two) times daily. 05/19/20  Yes [provider]  tiZANidine (ZANAFLEX) 4 MG tablet TAKE TWO (2) TABLETS THREE (3) TIMES DAILY Patient taking differently: Take 8 mg by mouth 3 (three) times daily. 03/05/16  Yes Sater, Nanine Means, MD  zinc gluconate 50 MG tablet Take 50 mg by mouth daily. 05/21/20  Yes [provider]  baclofen (LIORESAL) 10 MG tablet TAKE ONE TABLET FOUR TIMES DAILY Patient not taking: No sig reported 04/20/16   Sater, Nanine Means, MD  CALMOSEPTINE 0.44-20.6 % OINT Apply 1  application topically 3 (three) times daily as needed (skin irritation). 06/10/20   [provider]  citalopram (CELEXA) 20 MG tablet TAKE 1 TABLET DAILY Patient not taking: No sig reported 05/22/15   Sater, Nanine Means, MD  cyclobenzaprine (FLEXERIL) 5 MG tablet Take 1 tablet (5 mg total) by mouth at bedtime. Patient not taking: No sig reported 04/20/16   Sater, Nanine Means, MD  DULoxetine (CYMBALTA) 60 MG capsule Take 1 capsule (60 mg total) by mouth daily. Patient not taking: No sig reported 04/20/16   Sater, Nanine Means, MD  gabapentin (NEURONTIN) 300 MG capsule Take 1 capsule (300 mg total) by mouth 3 (three) times daily. 03/09/16 03/10/17  Sater, Nanine Means, MD  HYDROcodone-acetaminophen (NORCO) 10-325 MG tablet Take one tablet 4 times daily as needed. Patient not taking: No sig reported 05/26/16   Sater, Nanine Means, MD  methylPREDNISolone (MEDROL DOSEPAK) 4 MG TBPK tablet Take 6 tablets on day 1, 5 tablets on day 2, 4 tablets on day 3, 3 tablets on day 4, 2 tablets on day 5, and 1 tablet on day 6. Patient not taking: No sig reported 06/01/16   Sater, Nanine Means, MD  morphine (MSIR) 30 MG tablet Take 1 tablet (30 mg total) by mouth 2 (two) times daily. Patient not taking: No sig reported 05/26/16   Britt Bottom, MD  Naloxone HCl 0.4 MG/0.4ML SOAJ Use as directed if unable to arouse the patient Patient not taking: No sig reported 01/16/16   Sater, Nanine Means, MD  Ocrelizumab 300 MG/10ML SOLN Inject 600 mg into the vein every 6 (six) months.    [provider]  omeprazole (PRILOSEC) 20 MG capsule TAKE 1 CAPSULE DAILY 30 MINUTES BEFORE BREAKFAST 07/12/14 01/08/15  [provider]  oxybutynin (DITROPAN) 5 MG tablet TAKE 2 TABLETS 3 TIMES A DAY. IF CAUSES DRY MOUTH DECREASE TO 1 TABLET 3 TIMES A DAY Patient not taking: No sig reported 09/29/16   Sater, Nanine Means, MD  polyethylene glycol (MIRALAX / GLYCOLAX) packet Take 17 g by mouth daily. Patient not taking: No sig reported 07/25/14    Sater, Nanine Means, MD  sulfamethoxazole-trimethoprim (BACTRIM DS,SEPTRA DS) 800-160 MG tablet Take 1 tablet by mouth 2 (two) times daily. Patient not  taking: No sig reported 04/08/16   Britt Bottom, MD  SYMBICORT 160-4.5 MCG/ACT inhaler Inhale 2 puffs into the lungs daily. 05/21/20   [provider]  traMADol (ULTRAM) 50 MG tablet Take 1 tablet 3 times daily as needed for pain. Patient not taking: No sig reported 05/26/16   Sater, Nanine Means, MD  TRULANCE 3 MG TABS Take 1 tablet by mouth daily. 05/20/20   [provider]      Critical care time: 60 minutes.  The treatment and management of the patient's condition was required based on the threat of imminent deterioration. This time reflects time spent by the physician evaluating, providing care and managing the critically ill patient's care. The time was spent at the immediate bedside (or on the same floor/unit and dedicated to this patient's care). Time involved in separately billable procedures is NOT included int he critical care time indicated above. Family meeting and update time may be included above if and only if the patient is unable/incompetent to participate in clinical interview and/or decision making, and the discussion was necessary to determining treatment decisions.   Renee Pain, MD Board Certified by the ABIM, [place] Pager: [phone]

## 2020-06-17 ENCOUNTER — Inpatient Hospital Stay (HOSPITAL_COMMUNITY): Payer: Medicare Other

## 2020-06-17 DIAGNOSIS — R6520 Severe sepsis without septic shock: Secondary | ICD-10-CM

## 2020-06-17 DIAGNOSIS — L089 Local infection of the skin and subcutaneous tissue, unspecified: Secondary | ICD-10-CM | POA: Diagnosis present

## 2020-06-17 DIAGNOSIS — A419 Sepsis, unspecified organism: Secondary | ICD-10-CM | POA: Diagnosis not present

## 2020-06-17 LAB — CBC
HCT: 29.6 % — ABNORMAL LOW (ref 36.0–46.0)
HCT: 26.3 % — ABNORMAL LOW (ref 36.0–46.0)
Hemoglobin: 9.7 g/dL — ABNORMAL LOW (ref 12.0–15.0)
Hemoglobin: 8.6 g/dL — ABNORMAL LOW (ref 12.0–15.0)
MCH: 26.1 pg (ref 26.0–34.0)
MCH: 26 pg (ref 26.0–34.0)
MCHC: 32.8 g/dL (ref 30.0–36.0)
MCHC: 32.7 g/dL (ref 30.0–36.0)
MCV: 79.6 fL — ABNORMAL LOW (ref 80.0–100.0)
MCV: 79.5 fL — ABNORMAL LOW (ref 80.0–100.0)
Platelets: 445 10*3/uL — ABNORMAL HIGH (ref 150–400)
Platelets: 401 10*3/uL — ABNORMAL HIGH (ref 150–400)
RBC: 3.72 MIL/uL — ABNORMAL LOW (ref 3.87–5.11)
RBC: 3.31 MIL/uL — ABNORMAL LOW (ref 3.87–5.11)
RDW: 19.9 % — ABNORMAL HIGH (ref 11.5–15.5)
RDW: 19.3 % — ABNORMAL HIGH (ref 11.5–15.5)
WBC: 19.6 10*3/uL — ABNORMAL HIGH (ref 4.0–10.5)
WBC: 18.1 10*3/uL — ABNORMAL HIGH (ref 4.0–10.5)
nRBC: 0.2 % (ref 0.0–0.2)
nRBC: 0 % (ref 0.0–0.2)

## 2020-06-17 LAB — MAGNESIUM: Magnesium: 2.1 mg/dL (ref 1.7–2.4)

## 2020-06-17 LAB — TROPONIN I (HIGH SENSITIVITY)
Troponin I (High Sensitivity): 112 ng/L (ref <18)
Troponin I (High Sensitivity): 100 ng/L (ref <18)

## 2020-06-17 LAB — COMPREHENSIVE METABOLIC PANEL
ALT: 16 U/L (ref 0–44)
AST: 18 U/L (ref 15–41)
Albumin: 1.9 g/dL — ABNORMAL LOW (ref 3.5–5.0)
Alkaline Phosphatase: 94 U/L (ref 38–126)
Anion gap: 12 (ref 5–15)
BUN: 21 mg/dL — ABNORMAL HIGH (ref 6–20)
CO2: 24 mmol/L (ref 22–32)
Calcium: 8.5 mg/dL — ABNORMAL LOW (ref 8.9–10.3)
Chloride: 100 mmol/L (ref 98–111)
Creatinine, Ser: 0.73 mg/dL (ref 0.44–1.00)
GFR, Estimated: >60 mL/min (ref >60)
Glucose, Bld: 103 mg/dL — ABNORMAL HIGH (ref 70–99)
Potassium: 2.8 mmol/L — ABNORMAL LOW (ref 3.5–5.1)
Sodium: 136 mmol/L (ref 135–145)
Total Bilirubin: 0.5 mg/dL (ref 0.3–1.2)
Total Protein: 5.1 g/dL — ABNORMAL LOW (ref 6.5–8.1)

## 2020-06-17 LAB — HIV ANTIBODY (ROUTINE TESTING W REFLEX): HIV Screen 4th Generation wRfx: NONREACTIVE

## 2020-06-17 LAB — CREATININE, SERUM
Creatinine, Ser: 0.56 mg/dL (ref 0.44–1.00)
GFR, Estimated: >60 mL/min (ref >60)

## 2020-06-17 LAB — LACTIC ACID, PLASMA
Lactic Acid, Venous: 1.8 mmol/L (ref 0.5–1.9)
Lactic Acid, Venous: 2.9 mmol/L (ref 0.5–1.9)

## 2020-06-17 LAB — PROCALCITONIN: Procalcitonin: 1.98 ng/mL

## 2020-06-17 MED ORDER — PIPERACILLIN-TAZOBACTAM 3.375 G IVPB
3.3750 g | Freq: Three times a day (TID) | INTRAVENOUS | Status: DC
  Administered 2020-06-17 – 2020-06-20 (×11): 3.375 g via INTRAVENOUS
  Filled 2020-06-17 (×11): qty 50

## 2020-06-17 MED ORDER — POTASSIUM CHLORIDE 10 MEQ/100ML IV SOLN
10.0000 meq | INTRAVENOUS | Status: AC
Start: 2020-06-17 — End: 2020-06-17
  Administered 2020-06-17 (×4): 10 meq via INTRAVENOUS
  Filled 2020-06-17 (×4): qty 100

## 2020-06-17 MED ORDER — ASPIRIN 325 MG PO TABS
325.0000 mg | ORAL_TABLET | Freq: Once | ORAL | Status: AC
  Administered 2020-06-17: 325 mg via ORAL
  Filled 2020-06-17: qty 1

## 2020-06-17 MED ORDER — OXYCODONE HCL 5 MG PO TABS
5.0000 mg | ORAL_TABLET | Freq: Four times a day (QID) | ORAL | Status: DC | PRN
  Administered 2020-06-17 – 2020-06-20 (×6): 5 mg via ORAL
  Filled 2020-06-17 (×6): qty 1

## 2020-06-17 MED ORDER — ASPIRIN 81 MG PO CHEW
81.0000 mg | CHEWABLE_TABLET | Freq: Every day | ORAL | Status: DC
  Administered 2020-06-18: 09:00:00 81 mg via ORAL
  Filled 2020-06-17: qty 1

## 2020-06-17 MED ORDER — DAKINS (1/4 STRENGTH) 0.125 % EX SOLN
Freq: Two times a day (BID) | CUTANEOUS | Status: AC
  Filled 2020-06-17: qty 473

## 2020-06-17 MED ORDER — DEXTROSE IN LACTATED RINGERS 5 % IV SOLN
INTRAVENOUS | Status: DC
  Filled 2020-06-17 (×2): qty 1000

## 2020-06-17 NOTE — ED Notes (Signed)
MD at bedside. Pt to be downgraded from ICU to [place]. MD aware of pts HR of 102

## 2020-06-17 NOTE — ED Notes (Signed)
Date and time results received: 06/17/20 1:29 AM  (use smartphrase ".now" to insert current time)  Test: troponin  Critical Value: 112  Name of Provider Notified:   Orders Received? Or Actions Taken?:

## 2020-06-17 NOTE — ED Notes (Signed)
Date and time results received: 06/17/20 2:20 AM  (use smartphrase ".now" to insert current time)  Test: troponin Critical Value: 112  Name of Provider Notified: Renee Pain, MD  Orders Received? Or Actions Taken?:

## 2020-06-17 NOTE — Consult Note (Signed)
WOC Nurse Consult Note: Patient receiving care in [place]. Reason for Consult: sacral ulcer Wound type: stage 4 PI, seen in Wound clinic 06/12/20 when the wound was extensively debrided by staff there.  See the note from that encounter for details Pressure Injury POA: Yes Measurement: Wound bed: Drainage (amount, consistency, odor)  Periwound: Dressing procedure/placement/frequency: Twice daily 1/4% Dakin's solution moistened gauze and ABD pads.  Also, per the [place] note, the wound extends to the fascia.  Please consult surgery if aggressive care is desired.  I have also added an order for a standard size bed with air mattress. Thank you for the consult. [place] nurse will not follow at this time.  Please re-consult the [place] team if needed.  Val Riles, RN, MSN, CWOCN, CNS-BC, pager [phone]

## 2020-06-17 NOTE — ED Notes (Signed)
Pt wound cleaned and brief changed.

## 2020-06-17 NOTE — ED Notes (Signed)
Date and time results received: 06/17/20 03:14   Test: Lactic  Critical Value: 2.9  Name of Provider Notified: Dr. Carson Myrtle

## 2020-06-17 NOTE — Progress Notes (Signed)
NAME:  Tina Griffith MRN:  [id number] DOB:  04/28/1961 LOS: 1 ADMISSION DATE:  06/16/2020 DATE OF SERVICE:  06/16/2020  CHIEF COMPLAINT:  Failure to thrive   HISTORY & PHYSICAL  BRIEF  This 60 y.o. African-American female presented to the [facility] Emergency Department via EMS with complaints of failure to thrive and worsening of gluteal/sacral pressure sores.  The patient's daughter is at the bedside and is able to help in providing medical history.  The patient was recently discharged from a hospitalization for treatment of COVID-19 pneumonia at the end of December 2021, during which time she developed her pressure sores.  She was discharged home but has experienced generalized fatigue, anorexia and increasing lower extremity edema.  She has a history of multiple sclerosis, which has her confined to a wheelchair for mobility.  At baseline, she is able to bear weight with the assistance of a walker; however, she is not able to ambulate.  She has a dense, left lower extremity motor deficit due to her multiple sclerosis.   Past Medical/Surgical/Social/Family History    has a past medical history of Constipation, Hypertension, Movement disorder, MS (multiple sclerosis) ([place]), and Vision abnormalities.   has a past surgical history that includes Colostomy; Colostomy reversal; Knee surgery; and Hernia repair.    Procedures:  N/A   Significant Diagnostic Tests:  CT abdomen/pelvis (1/24) demonstrates sacral decubitus with involvement down to coccyx but without overt signs of acute osteomyelitis.  12-lead EKG (1/24 1549) shows sinus tachycardia with right bundle branch block.  Nonspecific T wave changes in I, aVL.   Micro Data:   Results for orders placed or performed during the hospital encounter of 10/16/14  Urine culture     Status: None   Collection Time: 10/16/14  1:55 PM   Specimen: Urine, Catheterized  Result Value Ref Range Status   Specimen Description  URINE, CATHETERIZED  Final   Special Requests NONE  Final   Colony Count NO GROWTH Performed at [facility]   Final   Culture NO GROWTH Performed at [facility]   Final   Report Status 10/17/2014 FINAL  Final      Antimicrobials:  Zosyn (1/24) Rocephin (1/24>>) Vancomycin (1/24>>)   Interim history/subjective:    06/17/2020 -still remaining in the ER bed #21.  She not on the ventilator.  She is not on pressors.  According to the bedside nurse he never needed pressors.  She is stable.  Nurse feels she can go to MedSurg.  Mild sinus tachycardia.  Patient feels stable.  She reports a history of multiple sclerosis since 2002.  She has been using walker and wheelchair up until COVID-19 admission in December and since then has developed sacral decub's and has been bedbound.  She denies any active complaints.  A clear liquid diet has been prescribed for her \ -    Objective   BP 119/71   Pulse (!) 102   Temp 98.3 F (36.8 C) (Oral)   Resp (!) 21   SpO2 94%     There were no vitals filed for this visit. No intake or output data in the 24 hours ending 06/17/20 0938      Bedbound deconditioned female alert and oriented x3.  Speaks well cognitively intact alert and oriented x3.  Slow speech.  She is pleasant.  Clear to auscultation.  Normal heart sounds sinus tachycardia.  Also ox is normal.  Abdomen is soft.  She is able to  move her upper extremities against gravity.  She not able to move her lower extremities.  Lower extremities appear somewhat edematous.  She says this is baseline.  Sacrum not examined.  She is supine at this point.   Resolved Hospital Problem list   N/A   Assessment & Plan:  ASSESSMENT / PLAN:  PULMONARY  A:  MS and hx of covid puts her at risk for HCAP/resp distress  06/17/2020 -> dong well  P:   Monitor Pulmonary toilet   NEUROLOGIC A:   Baseline MS since 2002. Progressoive immobility. Was using walker prior to covid dec 2021.  Since then bed bound Home MS  meds - dalfampridine, ocrelizumab Chronic pain and other CNS med s- percocet, baclifen, norco, celexa, gabapentin, naloxone, ultram, MSIR 30 bid  P:   Monitor Hold home CNS meds and slowly restart 06/18/20     VASCULAR* A:   BP at home - on lopressor, HCTz There was concerns for septic shock and ccm admittted 06/16/20  06/17/2020 - never needed pressors  P:  MAP goa> 65 Fluids Hold bp meds  CARDIAC STRUCTURAL A: On Livalo annd baby aspirin for lipids at home [place] elevated troponinemia  P: Recheck trop 1/26/222 Echo per prior oder   CARDIAC ELECTRICAL A: Baseline EKG wit RBBB   06/17/2020 - beein sinus in ER   P: Repeat EKG 06/18/20  INFECTIOUS A:   On Bactrim proph at home 1DS BID ? Indication   Admited with suspected sepsis syndrom - abnormaal UA. Suspected UTI but sacral decub also source  P:   Broad abx for current sepsis - vanc/zosuns Check urine culture Await  ciuture  RENAL A:  At Risk AKI Lactic acidosis - improving   P:  Monitor lactate Fluids  ELECTROLYTES A:  Severe low K P: Replete K goal >= 4 Mag goal >= 2   GASTROINTESTINAL A:   Hx of IBS - on trulance at home PPI at home  P:   ppi  HEMATOLOGIC   - HEME A:  Anemia of chronic disease and at risk for inpatient anemia  06/17/2020: No active bleeding   P:  - PRBC for hgb </= 6.9gm%    - exceptions are   -  if ACS susepcted/confirmed then transfuse for hgb </= 8.0gm%,  or    -  active bleeding with hemodynamic instability, then transfuse regardless of hemoglobin value   At at all times try to transfuse 1 unit prbc as possible with exception of active hemorrhage   HEMATOLOGIC - Platelets A Thrombocytosis but at risk for thrombocytopenia  P Monitor closely  ENDOCRINE A:   At risk for hypo and hyperglycemia P:   SSI  MSK/DERM Sacral decub's after COVID-19 infection in December 2021  Plan  -Wound care consult - bid dressing  needed per daughter  - need to figure out if cause of sepsis     Best practice:  Diet: Clear liquids, advance as tolerated to cardiac Pain/Anxiety/Delirium protocol (if indicated): prn oral opioods - start 06/17/2020 and then adjust accodingluy VAP protocol (if indicated): Not applicable DVT prophylaxis: Heparin SQ GI prophylaxis: Protonix  Glucose control: Not applicable Mobility/Activity: Bedrest CODE STATUS:   Code Status: Full Code Family Communication:  patient's family (Daughter) - devon kinsler [phone] - daughter very keen patient eat and get dressing change  Disposition: Move from ER to med surge  [facility] to be primary from 06/18/20 and ccm off - d/w DR Melina Schools Dahal   ATTESTATION &  SIGNATURE    Dr. Brand Males, M.D., [facility].C.P Pulmonary and Critical Care Medicine Staff Physician [facility] Pager: [phone], If no answer or between  15:00h - 7:00h: call [phone]  06/17/2020 9:38 AM    LABS    PULMONARY Recent Labs  Lab 06/16/20 1553  TCO2 28    CBC Recent Labs  Lab 06/16/20 1532 06/16/20 1553 06/16/20 2322 06/17/20 0436  HGB 11.1* 11.6* 9.7* 8.6*  HCT 34.5* 34.0* 29.6* 26.3*  WBC 23.5*  --  19.6* 18.1*  PLT 459*  --  445* 401*    COAGULATION Recent Labs  Lab 06/16/20 1532  INR 1.1    CARDIAC  No results for input(s): TROPONINI in the last 168 hours. No results for input(s): PROBNP in the last 168 hours.   CHEMISTRY Recent Labs  Lab 06/16/20 1532 06/16/20 1553 06/16/20 1640 06/16/20 2322 06/17/20 0436  NA 138 137  --   --  136  K 2.8* 2.8*  --   --  2.8*  CL 97* 99  --   --  100  CO2 25  --   --   --  24  GLUCOSE 125* 122*  --   --  103*  BUN 41* 35*  --   --  21*  CREATININE 0.88 0.90  --  0.56 0.73  CALCIUM 9.3  --   --   --  8.5*  MG  --   --  1.6*  --  2.1   CrCl cannot be calculated (Unknown ideal weight.).   LIVER Recent Labs  Lab 06/16/20 1532  06/17/20 0436  AST 24 18  ALT 21 16  ALKPHOS 120 94  BILITOT 0.6 0.5  PROT 6.6 5.1*  ALBUMIN 2.4* 1.9*  INR 1.1  --      INFECTIOUS Recent Labs  Lab 06/16/20 1850 06/16/20 2101 06/16/20 2300 06/17/20 0230  LATICACIDVEN 4.1*  --  1.8 2.9*  PROCALCITON  --  1.98  --   --      ENDOCRINE CBG (last 3)  No results for input(s): GLUCAP in the last 72 hours.       IMAGING x48h  - image(s) personally visualized  -   highlighted in bold CT Abdomen Pelvis W Contrast  Result Date: 06/16/2020 CLINICAL DATA:  Sepsis, sacral wound. EXAM: CT ABDOMEN AND PELVIS WITH CONTRAST TECHNIQUE: Multidetector CT imaging of the abdomen and pelvis was performed using the standard protocol following bolus administration of intravenous contrast. CONTRAST:  154mL OMNIPAQUE IOHEXOL 300 MG/ML  SOLN COMPARISON:  None. FINDINGS: Lower chest: No acute abnormality. Hepatobiliary: No focal hepatic abnormality. Gallbladder unremarkable. Pancreas: No focal abnormality or ductal dilatation. Spleen: No focal abnormality.  Normal size. Adrenals/Urinary Tract: No adrenal abnormality. No focal renal abnormality. No stones or hydronephrosis. Urinary bladder is unremarkable. Stomach/Bowel: Large stool burden in the rectosigmoid colon. No evidence of bowel obstruction. Vascular/Lymphatic: Aortic atherosclerosis. No evidence of aneurysm or adenopathy. Reproductive: Fibroid uterus, unchanged.  No adnexal mass. Other: No free fluid or free air. Musculoskeletal: Large open sacral decubitus ulcer/wound noted. This extends to the coccyx without definite acute osteomyelitis. IMPRESSION: Large sacral wound extends down to the coccyx without definitive change of acute osteomyelitis. Large stool burden in the rectosigmoid colon concerning for fecal impaction. Fibroid uterus. Aortic atherosclerosis. Electronically Signed   By: Rolm Baptise M.D.   On: 06/16/2020 19:23   DG Chest Port 1 View  Result Date: 06/16/2020  CLINICAL DATA:   Possible sepsis. EXAM: PORTABLE CHEST 1 VIEW COMPARISON:  05/13/2020 FINDINGS: 1530 hours. The lungs are clear without focal pneumonia, edema, pneumothorax or pleural effusion. Linear atelectasis or scarring noted right base. Interstitial markings are diffusely coarsened with chronic features. The cardiopericardial silhouette is within normal limits for size. The visualized bony structures of the thorax show no acute abnormality. IMPRESSION: No active disease. Electronically Signed   By: Misty Stanley M.D.   On: 06/16/2020 15:52

## 2020-06-17 NOTE — ED Notes (Signed)
Pt provided with water. Oral care performed. Pt provided with mouth swab. Pt provided with toothbrush and toothpaste, pt provided oral care to herself.

## 2020-06-17 NOTE — ED Notes (Signed)
Pt had BM. Pt cleaned, peri care performed. Pts wound to sacrum repacked and redressed with wet gauze. Pt tolerated well. NAD noted.

## 2020-06-17 NOTE — ED Notes (Addendum)
Date and time results received: 06/17/20 1:50 AM  Test: troponin Critical Value: 112  Name of Provider Notified: Marchelle Gearing MD  Orders Received? Or Actions Taken?: Actions Taken: MD notified   Dr. Carson Myrtle notified by Dr. Duwayne Heck.

## 2020-06-18 ENCOUNTER — Inpatient Hospital Stay (HOSPITAL_COMMUNITY): Payer: Medicare Other

## 2020-06-18 DIAGNOSIS — A419 Sepsis, unspecified organism: Secondary | ICD-10-CM | POA: Diagnosis not present

## 2020-06-18 DIAGNOSIS — R6521 Severe sepsis with septic shock: Secondary | ICD-10-CM

## 2020-06-18 LAB — CBC
HCT: 26.7 % — ABNORMAL LOW (ref 36.0–46.0)
Hemoglobin: 8.6 g/dL — ABNORMAL LOW (ref 12.0–15.0)
MCH: 26 pg (ref 26.0–34.0)
MCHC: 32.2 g/dL (ref 30.0–36.0)
MCV: 80.7 fL (ref 80.0–100.0)
Platelets: 413 10*3/uL — ABNORMAL HIGH (ref 150–400)
RBC: 3.31 MIL/uL — ABNORMAL LOW (ref 3.87–5.11)
RDW: 19.9 % — ABNORMAL HIGH (ref 11.5–15.5)
WBC: 14.9 10*3/uL — ABNORMAL HIGH (ref 4.0–10.5)
nRBC: 0 % (ref 0.0–0.2)

## 2020-06-18 LAB — COMPREHENSIVE METABOLIC PANEL
ALT: 16 U/L (ref 0–44)
AST: 18 U/L (ref 15–41)
Albumin: 1.9 g/dL — ABNORMAL LOW (ref 3.5–5.0)
Alkaline Phosphatase: 86 U/L (ref 38–126)
Anion gap: 12 (ref 5–15)
BUN: 10 mg/dL (ref 6–20)
CO2: 25 mmol/L (ref 22–32)
Calcium: 8.4 mg/dL — ABNORMAL LOW (ref 8.9–10.3)
Chloride: 100 mmol/L (ref 98–111)
Creatinine, Ser: 0.72 mg/dL (ref 0.44–1.00)
GFR, Estimated: >60 mL/min (ref >60)
Glucose, Bld: 122 mg/dL — ABNORMAL HIGH (ref 70–99)
Potassium: 2.7 mmol/L — CL (ref 3.5–5.1)
Sodium: 137 mmol/L (ref 135–145)
Total Bilirubin: 0.5 mg/dL (ref 0.3–1.2)
Total Protein: 5 g/dL — ABNORMAL LOW (ref 6.5–8.1)

## 2020-06-18 LAB — ECHOCARDIOGRAM COMPLETE
Area-P 1/2: 5.66 cm2
Calc EF: 57.4 %
Height: 65 in
S' Lateral: 2.3 cm
Single Plane A2C EF: 61.3 %
Single Plane A4C EF: 58 %
Weight: 2640 oz

## 2020-06-18 LAB — GLUCOSE, CAPILLARY
Glucose-Capillary: 148 mg/dL — ABNORMAL HIGH (ref 70–99)
Glucose-Capillary: 115 mg/dL — ABNORMAL HIGH (ref 70–99)
Glucose-Capillary: 120 mg/dL — ABNORMAL HIGH (ref 70–99)

## 2020-06-18 LAB — LACTIC ACID, PLASMA: Lactic Acid, Venous: 1.1 mmol/L (ref 0.5–1.9)

## 2020-06-18 LAB — PHOSPHORUS: Phosphorus: 1.8 mg/dL — ABNORMAL LOW (ref 2.5–4.6)

## 2020-06-18 LAB — MAGNESIUM: Magnesium: 1.4 mg/dL — ABNORMAL LOW (ref 1.7–2.4)

## 2020-06-18 LAB — HEMOGLOBIN A1C
Hgb A1c MFr Bld: 6.5 % — ABNORMAL HIGH (ref 4.8–5.6)
Mean Plasma Glucose: 139.85 mg/dL

## 2020-06-18 LAB — TROPONIN I (HIGH SENSITIVITY): Troponin I (High Sensitivity): 58 ng/L — ABNORMAL HIGH (ref <18)

## 2020-06-18 MED ORDER — POTASSIUM CHLORIDE ER 10 MEQ PO CPCR: 10.0000 meq | ORAL_CAPSULE | Freq: Two times a day (BID) | ORAL | Status: DC

## 2020-06-18 MED ORDER — MENTHOL-ZINC OXIDE 0.44-20.6 % EX OINT: 1.0000 "application " | TOPICAL_OINTMENT | Freq: Three times a day (TID) | CUTANEOUS | Status: DC | PRN

## 2020-06-18 MED ORDER — TIZANIDINE HCL 4 MG PO TABS
8.0000 mg | ORAL_TABLET | Freq: Three times a day (TID) | ORAL | Status: DC
Start: 2020-06-18 — End: 2020-07-07
  Administered 2020-06-18 – 2020-07-07 (×55): 8 mg via ORAL
  Filled 2020-06-18 (×55): qty 2

## 2020-06-18 MED ORDER — PROSOURCE PLUS PO LIQD
30.0000 mL | Freq: Three times a day (TID) | ORAL | Status: DC
  Administered 2020-06-20 – 2020-07-07 (×48): 30 mL via ORAL
  Filled 2020-06-18 (×48): qty 30

## 2020-06-18 MED ORDER — PLECANATIDE 3 MG PO TABS
1.0000 | ORAL_TABLET | Freq: Every day | ORAL | Status: DC
  Administered 2020-06-22 – 2020-07-07 (×10): 1 via ORAL

## 2020-06-18 MED ORDER — BENZONATATE 100 MG PO CAPS
100.0000 mg | ORAL_CAPSULE | Freq: Three times a day (TID) | ORAL | Status: DC
  Administered 2020-06-18 – 2020-07-07 (×55): 100 mg via ORAL
  Filled 2020-06-18 (×55): qty 1

## 2020-06-18 MED ORDER — PRAVASTATIN SODIUM 20 MG PO TABS
20.0000 mg | ORAL_TABLET | Freq: Every day | ORAL | Status: DC
  Administered 2020-06-18 – 2020-07-06 (×19): 20 mg via ORAL
  Filled 2020-06-18 (×19): qty 1

## 2020-06-18 MED ORDER — OXYBUTYNIN CHLORIDE ER 5 MG PO TB24
10.0000 mg | ORAL_TABLET | Freq: Every day | ORAL | Status: DC
  Administered 2020-06-18 – 2020-07-07 (×19): 10 mg via ORAL
  Filled 2020-06-18 (×19): qty 2

## 2020-06-18 MED ORDER — FERROUS SULFATE 325 (65 FE) MG PO TABS
325.0000 mg | ORAL_TABLET | Freq: Two times a day (BID) | ORAL | Status: DC
  Administered 2020-06-18 – 2020-07-07 (×37): 325 mg via ORAL
  Filled 2020-06-18 (×38): qty 1

## 2020-06-18 MED ORDER — ADULT MULTIVITAMIN W/MINERALS CH
1.0000 | ORAL_TABLET | Freq: Every day | ORAL | Status: DC
  Administered 2020-06-18: 10:00:00 1 via ORAL
  Filled 2020-06-18: qty 1

## 2020-06-18 MED ORDER — FLUTICASONE FUROATE-VILANTEROL 200-25 MCG/INH IN AEPB
1.0000 | INHALATION_SPRAY | Freq: Every day | RESPIRATORY_TRACT | Status: DC
Start: 2020-06-18 — End: 2020-06-18

## 2020-06-18 MED ORDER — METOPROLOL TARTRATE 25 MG PO TABS
25.0000 mg | ORAL_TABLET | Freq: Two times a day (BID) | ORAL | Status: DC
  Administered 2020-06-18 (×2): 25 mg via ORAL
  Filled 2020-06-18 (×3): qty 1

## 2020-06-18 MED ORDER — POTASSIUM CHLORIDE CRYS ER 20 MEQ PO TBCR
40.0000 meq | EXTENDED_RELEASE_TABLET | ORAL | Status: AC
  Administered 2020-06-18 (×2): 40 meq via ORAL
  Filled 2020-06-18 (×2): qty 2

## 2020-06-18 MED ORDER — FLUTICASONE FUROATE-VILANTEROL 100-25 MCG/INH IN AEPB
1.0000 | INHALATION_SPRAY | Freq: Every day | RESPIRATORY_TRACT | Status: DC
  Administered 2020-06-19 – 2020-07-06 (×15): 1 via RESPIRATORY_TRACT
  Filled 2020-06-18 (×2): qty 28

## 2020-06-18 MED ORDER — INSULIN ASPART 100 UNIT/ML ~~LOC~~ SOLN: 0.0000 [IU] | Freq: Every day | SUBCUTANEOUS | Status: DC

## 2020-06-18 MED ORDER — ZINC OXIDE 40 % EX OINT
TOPICAL_OINTMENT | Freq: Three times a day (TID) | CUTANEOUS | Status: DC | PRN
  Filled 2020-06-18: qty 57

## 2020-06-18 MED ORDER — MAGNESIUM SULFATE 4 GM/100ML IV SOLN
4.0000 g | Freq: Once | INTRAVENOUS | Status: AC
  Administered 2020-06-18: 4 g via INTRAVENOUS
  Filled 2020-06-18: qty 100

## 2020-06-18 MED ORDER — INSULIN ASPART 100 UNIT/ML ~~LOC~~ SOLN
0.0000 [IU] | Freq: Three times a day (TID) | SUBCUTANEOUS | Status: DC
  Administered 2020-06-21 – 2020-06-30 (×4): 1 [IU] via SUBCUTANEOUS

## 2020-06-18 MED ORDER — ENSURE ENLIVE PO LIQD: 237.0000 mL | Freq: Two times a day (BID) | ORAL | Status: DC

## 2020-06-18 MED ORDER — DALFAMPRIDINE ER 10 MG PO TB12
10.0000 mg | ORAL_TABLET | Freq: Two times a day (BID) | ORAL | Status: DC
Start: 2020-06-18 — End: 2020-07-07
  Administered 2020-06-22 – 2020-07-06 (×23): 10 mg via ORAL

## 2020-06-18 MED ORDER — POTASSIUM PHOSPHATES 15 MMOLE/5ML IV SOLN
30.0000 mmol | Freq: Once | INTRAVENOUS | Status: AC
  Administered 2020-06-18: 16:00:00 30 mmol via INTRAVENOUS
  Filled 2020-06-18: qty 10

## 2020-06-18 MED ORDER — K PHOS MONO-SOD PHOS DI & MONO 155-852-130 MG PO TABS
500.0000 mg | ORAL_TABLET | Freq: Every day | ORAL | Status: DC
  Administered 2020-06-18 – 2020-06-20 (×2): 500 mg via ORAL
  Filled 2020-06-18 (×3): qty 2

## 2020-06-18 NOTE — Plan of Care (Signed)
  Problem: Coping: Goal: Level of anxiety will decrease Outcome: Progressing   Problem: Nutrition: Goal: Adequate nutrition will be maintained Outcome: Progressing   Problem: Elimination: Goal: Will not experience complications related to bowel motility Outcome: Progressing Goal: Will not experience complications related to urinary retention Outcome: Progressing   Problem: Pain Managment: Goal: General experience of comfort will improve Outcome: Progressing   Problem: Safety: Goal: Ability to remain free from injury will improve Outcome: Progressing   Problem: Skin Integrity: Goal: Risk for impaired skin integrity will decrease Outcome: Progressing

## 2020-06-18 NOTE — TOC Initial Note (Signed)
Transition of Care [facility]) - Initial/Assessment Note    Patient Details  Name: Tina Griffith MRN: [id number] Date of Birth: 10/10/1960  Transition of Care [facility]) CM/SW Contact:    Lennart Pall, LCSW Phone Number: 06/18/2020, 11:12 AM  Clinical Narrative:                 Met with patient today to introduce self/ role and discuss dc plans and family support.  Pt very pleasant but a little lethargic.  She confirms that her son, Amador Cunas, lives in the home with her and she has a [facility] aide several hours / day.  She notes that she has required more assistance since her [place] hospitalization in Dec.  Agreeable for me to speak with her daughter/ son to gather more info.  Have spoken with pt's daughter, [name], who confirms info provided by pt.  She does note that she and her brother work days.  Pt has a [employer] aide (covered via Medicaid) daily 10-3 and 5-8.  Notes that pt is wheelchair bound but was able to perform stand-pivot transfer to bedside commode with a walker for support.  Daughter notes family would prefer for pt to return home, however, she is concerned about the wound care and is considering if SNF may be needed.  Daughter to discuss further with her brother. We are awaiting PT eval as well.  TOC to continue to follow for dc planning.      Expected Discharge Plan: [place] (vs home with [facility]) Barriers to Discharge: Continued Medical Work up   Patient Goals and CMS Choice Patient states their goals for this hospitalization and ongoing recovery are:: would prefer to dc home if possible      Expected Discharge Plan and Services Expected Discharge Plan: [place] (vs home with [facility]) In-house Referral: Clinical Social Work     Living arrangements for the past 2 months: Single Family Home                                      Prior Living Arrangements/Services Living arrangements for the past 2 months: Single Family Home Lives with:: Adult Children Patient  language and need for interpreter reviewed:: Yes Do you feel safe going back to the place where you live?: Yes      Need for Family Participation in Patient Care: Yes (Comment) Care giver support system in place?: Yes (comment) Current home services: Homehealth aide Criminal Activity/Legal Involvement Pertinent to Current Situation/Hospitalization: No - Comment as needed  Activities of Daily Living Home Assistive Devices/Equipment: Eyeglasses,Wheelchair,Walker (specify type),Bedside commode/3-in-1,Shower chair with back ADL Screening (condition at time of admission) Patient's cognitive ability adequate to safely complete daily activities?: No Is the patient deaf or have difficulty hearing?: No Does the patient have difficulty seeing, even when wearing glasses/contacts?: No (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) Does the patient have difficulty concentrating, remembering, or making decisions?: Yes (at times) Patient able to express need for assistance with ADLs?: Yes Does the patient have difficulty dressing or bathing?: Yes Independently performs ADLs?: No Communication: Independent Dressing (OT): Needs assistance Is this a change from baseline?: Pre-admission baseline Grooming: Independent Feeding: Needs assistance Is this a change from baseline?: Pre-admission baseline Bathing: Needs assistance Is this a change from baseline?: Pre-admission baseline Toileting: Needs assistance Is this a change from baseline?: Pre-admission baseline In/Out Bed: Needs assistance Is this a change from baseline?: Pre-admission baseline Walks  in Home: Dependent Is this a change from baseline?: Pre-admission baseline Does the patient have difficulty walking or climbing stairs?: Yes Weakness of Legs: Both Weakness of Arms/Hands: Both  Permission Sought/Granted Permission sought to share information with : Family Supports Permission granted to share information with : Yes, Verbal Permission Granted  Share  Information with NAME: Higher education careers adviser     Permission granted to share info w Relationship: daughter  Permission granted to share info w Contact Information: [phone]  Emotional Assessment Appearance:: Appears older than stated age Attitude/Demeanor/Rapport: Gracious Affect (typically observed): Accepting,Pleasant Orientation: : Oriented to Self,Oriented to Place,Oriented to  Time,Oriented to Situation Alcohol / Substance Use: Not Applicable Psych Involvement: No (comment)  Admission diagnosis:  Wound infection [T14.8XXA, L08.9] Septic shock (HCC) [A41.9, R65.21] Pressure injury of sacral region, unstageable (HCC) [L89.150] Sepsis with acute organ dysfunction without septic shock, due to unspecified organism, unspecified type (HCC) [A41.9, R65.20] Patient Active Problem List   Diagnosis Date Noted  . Wound infection 06/17/2020  . Leucocytosis 06/16/2020  . Septic shock (HCC) 06/16/2020  . UTI (urinary tract infection) 06/16/2020  . Depression with anxiety 04/20/2016  . Insomnia 04/20/2016  . Chronic prescription opiate use 01/16/2016  . Hypertriglyceridemia 06/20/2015  . Abnormal urine odor 10/09/2014  . Chronic kidney disease 10/09/2014  . Elevated WBC count 10/09/2014  . Multiple sclerosis (HCC) 07/25/2014  . Spastic gait 07/25/2014  . Leg pain, left 07/25/2014  . Urinary frequency 07/25/2014  . Constipation 07/25/2014  . Decreased potassium in the blood 04/22/2014  . Hypomagnesemia 04/22/2014  . Ataxic gait 02/28/2014  . Callosity 02/28/2014  . 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 Contracture of ankle and foot joint 02/28/2014  . Decubital ulcer 02/28/2014  . Fibroid 02/28/2014  . Dysfunctional or functional uterine hemorrhage 02/28/2014  . Incisional hernia with obstruction but no gangrene 02/28/2014  . Gonalgia 02/28/2014  . Extremity pain 02/28/2014  . Decreased motor strength 02/28/2014  . Non-pressure ulcer of lower extremity (HCC) 02/28/2014  .  Loss of feeling or sensation 02/28/2014  . Anterior optic neuritis 02/28/2014  . Hemorrhage, postmenopausal 02/28/2014  . Arthritis of knee, degenerative 02/28/2014  . AS (sickle cell trait) (HCC) 02/28/2014  . Hemiplegia, spastic (HCC) 02/28/2014  . Sickle cell trait (HCC) 02/28/2014  . Spastic hemiplegia (HCC) 02/28/2014  . Abnormal results of thyroid function studies 12/28/2013  . Allergic rhinitis 08/22/2013  . Disorder of magnesium metabolism 08/22/2013  . Essential (primary) hypertension 08/22/2013  . Hypercholesteremia 08/22/2013  . Adult hypothyroidism 08/22/2013  . Anemia, iron deficiency 08/22/2013  . Compulsive tobacco user syndrome 08/22/2013  . Absence of bladder continence 08/22/2013  . Hypercholesterolemia 08/22/2013  . Current tobacco use 08/22/2013  . Accumulation of fluid in tissues 11/29/2012  . Dermatophytic onychia 10/28/2012  . Colon, diverticulosis 10/28/2012  . Polypharmacy 10/28/2012  . DS (disseminated sclerosis) (HCC) 10/28/2012  . Arthralgia of lower leg 10/28/2012  . Menopausal symptom 10/28/2012  . Current tear of lateral cartilage or meniscus of knee 10/28/2012  . Diverticular disease of large intestine 10/28/2012  . Other long term (current) drug therapy 10/28/2012  . Abnormal uterine bleeding 06/26/2012  . Baseball finger 08/03/2010   PCP:  [facility]:   [facility] [place], [place] - [address] [place] [address] Phone: [phone] Fax: [phone]  [facility] - [place], [place] - [address] [place], [address] [place], [address] [place] [address]  Phone: [phone] Fax: [place] [id number] - [place], [place] [address] Phone: [phone] Fax: [phone]  [place], [place] [address] [place] [address] Phone: [phone] Fax: [place], [place] - [address] [place] [address] Phone: [phone] Fax: [phone]     Social Determinants of Health (SDOH) Interventions    Readmission Risk Interventions Readmission Risk Prevention Plan 06/18/2020  Transportation Screening Complete  PCP or Specialist Appt within 5-7 Days Complete  Home Care Screening Complete  Medication Review (RN CM) Complete  Some recent data might be hidden

## 2020-06-18 NOTE — Anesthesia Preprocedure Evaluation (Signed)
Anesthesia Evaluation  Patient identified by MRN, date of birth, ID band Patient awake    Reviewed: Allergy & Precautions, NPO status , Patient's Chart, lab work & pertinent test results  Airway Mallampati: III  TM Distance: >3 FB Neck ROM: Full    Dental no notable dental hx. (+) Poor Dentition, Missing, Chipped, Dental Advisory Given   Pulmonary pneumonia, resolved, Current SmokerPatient did not abstain from smoking.,  Recently D/c  After covid pneumonia 12/21   Pulmonary exam normal breath sounds clear to auscultation       Cardiovascular Exercise Tolerance: Poor hypertension, Pt. on medications and Pt. on home beta blockers Normal cardiovascular exam Rhythm:Regular Rate:Normal     Neuro/Psych Anxiety Pt w MS and Wheelchair bound  Neuromuscular disease    GI/Hepatic GERD  Medicated and Controlled,  Endo/Other  Hypothyroidism   Renal/GU Renal InsufficiencyRenal diseaseK+ 2.7 Cr 0.72     Musculoskeletal  (+) Arthritis ,   Abdominal   Peds  Hematology  (+) anemia , Hgb 8.6   Anesthesia Other Findings   Reproductive/Obstetrics                            Anesthesia Physical Anesthesia Plan  ASA: IV  Anesthesia Plan: General   Post-op Pain Management:    Induction: Intravenous  PONV Risk Score and Plan: Treatment may vary due to age or medical condition, Midazolam, Ondansetron and Dexamethasone  Airway Management Planned: LMA  Additional Equipment:   Intra-op Plan:   Post-operative Plan:   Informed Consent: I have reviewed the patients History and Physical, chart, labs and discussed the procedure including the risks, benefits and alternatives for the proposed anesthesia with the patient or authorized representative who has indicated his/her understanding and acceptance.     Dental advisory given  Plan Discussed with: CRNA  Anesthesia Plan Comments: (Pt w MS)       Anesthesia Quick Evaluation

## 2020-06-18 NOTE — H&P (View-Only) (Signed)
[facility] Surgery Consult Note  Tina Griffith 10-15-1960  [id number].    Requesting MD: B Dahal Chief Complaint: Failure to thrive, dark tarry stools, worsening buttocks wound. Reason for Consult: buttocks decubitus   HPI:   Patient is a 60 year old woman with history of MS and left hemiplegia recently admitted to [facility] with Covid infection respiratory failure.  She was discharged from there apparently with a sacral wound.  She had limited mobility, she was up in a wheelchair, able to bear weight but not ambulating prior to that admission.  She presented here with the above-noted complaints.  Work-up so far shows she is afebrile but tachycardic.  Blood pressure is stable.  Sats are good on 2 L nasal cannula.  Admission labs shows a potassium of 2.8, glucose of 103, creatinine 0.73, calcium 8.5, albumin 1.9, AST 18 ALT 16 total bilirubin 0.5.  Lactate was 2.9, WBC 19.  H/H 9.7/29.6, platelets 445,000.  Hemoglobin A1c 6.5. There is no Covid currently for this admission. CT of the abdomen pelvis with contrast showed a large sacral wound extends down to the coccyx without definitive changes of osteomyelitis. There is also a large stool burden in the rectosigmoid colon.  ROS: Review of Systems  Constitutional: Positive for malaise/fatigue.  HENT: Negative.   Eyes:       Decreased vision both eyes secondary to cataracts.  Respiratory: Negative.   Cardiovascular: Negative.   Gastrointestinal: Negative.        She is having black-green-colored stool. She says she is not eating well at home.  Musculoskeletal: Positive for back pain and myalgias.  Skin:       Progressive worsening of the sacral decubitus wound.  Neurological: Negative.   Endo/Heme/Allergies: Negative.   Psychiatric/Behavioral: Positive for depression.    Family History  Problem Relation Age of Onset  . Lung cancer Mother   . Stroke Mother   . Diabetes Father     Past Medical History:   Diagnosis Date  . Constipation   . Hypertension   . Movement disorder   . MS (multiple sclerosis) ([place])   . Vision abnormalities     Past Surgical History:  Procedure Laterality Date  . COLOSTOMY    . COLOSTOMY REVERSAL    . HERNIA REPAIR    . KNEE SURGERY      Social History:  reports that she has been smoking cigarettes. She has been smoking about 1.00 pack per day. She has never used smokeless tobacco. She reports that she does not drink alcohol and does not use drugs.  Allergies:  Allergies  Allergen Reactions  . No Known Allergies     Medications Prior to Admission  Medication Sig Dispense Refill  . Ascorbic Acid (VITAMIN C) 1000 MG tablet Take 1,000 mg by mouth in the morning and at bedtime.    . ASPIRIN LOW DOSE 81 MG EC tablet Take 81 mg by mouth daily.    . benzonatate (TESSALON) 100 MG capsule Take 100 mg by mouth 3 (three) times daily.    Marland Kitchen dalfampridine 10 MG TB12 Take 1 tablet (10 mg total) by mouth 2 (two) times daily. 60 tablet 11  . FEROSUL 325 (65 Fe) MG tablet Take 325 mg by mouth 2 (two) times daily.    . hydrochlorothiazide (HYDRODIURIL) 25 MG tablet Take 25 mg by mouth daily.    Marland Kitchen liraglutide (VICTOZA) 18 MG/3ML SOPN Inject 0.6 mg into the skin daily.    Marland Kitchen LIVALO 2 MG TABS  Take 1 tablet by mouth daily.    . metoprolol succinate (TOPROL-XL) 100 MG 24 hr tablet Take 100 mg by mouth daily.    . Misc Natural Products (AIRBORNE ELDERBERRY) CHEW Chew 2 tablets by mouth daily.    . Multiple Vitamin (MULTIVITAMIN ADULT) TABS Take 1 tablet by mouth daily.    Marland Kitchen omeprazole (PRILOSEC) 20 MG capsule Take 20 mg by mouth daily.    Marland Kitchen oxybutynin (DITROPAN-XL) 10 MG 24 hr tablet Take 10 mg by mouth daily.    Marland Kitchen oxyCODONE-acetaminophen (PERCOCET) 10-325 MG tablet Take 1 tablet by mouth 4 (four) times daily.    . potassium chloride (MICRO-K) 10 MEQ CR capsule Take 10 mEq by mouth 2 (two) times daily.    Marland Kitchen tiZANidine (ZANAFLEX) 4 MG tablet TAKE TWO (2) TABLETS THREE (3)  TIMES DAILY (Patient taking differently: Take 8 mg by mouth 3 (three) times daily.) 180 tablet 5  . zinc gluconate 50 MG tablet Take 50 mg by mouth daily.    . baclofen (LIORESAL) 10 MG tablet TAKE ONE TABLET FOUR TIMES DAILY (Patient not taking: No sig reported) 120 each 11  . CALMOSEPTINE 0.44-20.6 % OINT Apply 1 application topically 3 (three) times daily as needed (skin irritation).    . citalopram (CELEXA) 20 MG tablet TAKE 1 TABLET DAILY (Patient not taking: No sig reported) 30 tablet 6  . cyclobenzaprine (FLEXERIL) 5 MG tablet Take 1 tablet (5 mg total) by mouth at bedtime. (Patient not taking: No sig reported) 30 tablet 5  . DULoxetine (CYMBALTA) 60 MG capsule Take 1 capsule (60 mg total) by mouth daily. (Patient not taking: No sig reported) 30 capsule 11  . gabapentin (NEURONTIN) 300 MG capsule Take 1 capsule (300 mg total) by mouth 3 (three) times daily. 90 capsule 11  . HYDROcodone-acetaminophen (NORCO) 10-325 MG tablet Take one tablet 4 times daily as needed. (Patient not taking: No sig reported) 120 tablet 0  . methylPREDNISolone (MEDROL DOSEPAK) 4 MG TBPK tablet Take 6 tablets on day 1, 5 tablets on day 2, 4 tablets on day 3, 3 tablets on day 4, 2 tablets on day 5, and 1 tablet on day 6. (Patient not taking: No sig reported) 21 tablet 0  . morphine (MSIR) 30 MG tablet Take 1 tablet (30 mg total) by mouth 2 (two) times daily. (Patient not taking: No sig reported) 60 tablet 0  . Naloxone HCl 0.4 MG/0.4ML SOAJ Use as directed if unable to arouse the patient (Patient not taking: No sig reported) 0.4 mL 2  . Ocrelizumab 300 MG/10ML SOLN Inject 600 mg into the vein every 6 (six) months.    Marland Kitchen omeprazole (PRILOSEC) 20 MG capsule TAKE 1 CAPSULE DAILY 30 MINUTES BEFORE BREAKFAST    . oxybutynin (DITROPAN) 5 MG tablet TAKE 2 TABLETS 3 TIMES A DAY. IF CAUSES DRY MOUTH DECREASE TO 1 TABLET 3 TIMES A DAY (Patient not taking: No sig reported) 180 tablet 11  . polyethylene glycol (MIRALAX / GLYCOLAX)  packet Take 17 g by mouth daily. (Patient not taking: No sig reported) 30 each 5  . sulfamethoxazole-trimethoprim (BACTRIM DS,SEPTRA DS) 800-160 MG tablet Take 1 tablet by mouth 2 (two) times daily. (Patient not taking: No sig reported) 14 tablet 0  . SYMBICORT 160-4.5 MCG/ACT inhaler Inhale 2 puffs into the lungs daily.    . traMADol (ULTRAM) 50 MG tablet Take 1 tablet 3 times daily as needed for pain. (Patient not taking: No sig reported) 90 tablet 2  . TRULANCE  3 MG TABS Take 1 tablet by mouth daily.      Blood pressure (!) 143/66, pulse 65, temperature 98.2 F (36.8 C), temperature source Oral, resp. rate 16, height 5\' 5"  (1.651 m), weight 74.8 kg, SpO2 97 %. Physical Exam:  General: Chronically ill-appearing 60 year old African-American female in no acute distress. HEENT: head is normocephalic, atraumatic.  Sclera are noninjected. Pupils are equal ears and nose without any masses or lesions.  Mouth is pink and moist Heart: regular, rate, and rhythm.  Normal s1,s2. No obvious murmurs, gallops, or rubs noted.  Palpable radial and diminished pedal pulses bilaterally Lungs: CTAB, no wheezes, rhonchi, or rales noted.  Respiratory effort nonlabored Abd: soft, NT, ND, +BS, no masses, hernias, or organomegaly, well-healed midline surgical incision and former colostomy site. MS: Hemiplegic, unable to bear weight, currently strictly bedbound since December. Skin: warm and dry with no masses, lesions, or rashes Neuro: Cranial nerves 2-12 grossly intact, sensation is normal throughout Psych: A&Ox3 with an appropriate affect.     16 x 10 x 4 cm right gluteal decubitus. As you can see from his pictures she is got a fair amount of dead tissue, which is malodorous. You can feel bone when you examine it. She also had a bowel movement while we were there and it was black-green in color. Hemoccult is ordered. There are no Hemoccult cards on the floor.   Results for orders placed or performed during the  hospital encounter of 06/16/20 (from the past 48 hour(s))  Lactic acid, plasma     Status: None   Collection Time: 06/16/20  3:32 PM  Result Value Ref Range   Lactic Acid, Venous 1.6 0.5 - 1.9 mmol/L    Comment: Performed at [facility], [place] [address], [place], [place] [address]  Comprehensive metabolic panel     Status: Abnormal   Collection Time: 06/16/20  3:32 PM  Result Value Ref Range   Sodium 138 135 - 145 mmol/L   Potassium 2.8 (L) 3.5 - 5.1 mmol/L   Chloride 97 (L) 98 - 111 mmol/L   CO2 25 22 - 32 mmol/L   Glucose, Bld 125 (H) 70 - 99 mg/dL    Comment: Glucose reference range applies only to samples taken after fasting for at least 8 hours.   BUN 41 (H) 6 - 20 mg/dL   Creatinine, Ser 0.88 0.44 - 1.00 mg/dL   Calcium 9.3 8.9 - 10.3 mg/dL   Total Protein 6.6 6.5 - 8.1 g/dL   Albumin 2.4 (L) 3.5 - 5.0 g/dL   AST 24 15 - 41 U/L   ALT 21 0 - 44 U/L   Alkaline Phosphatase 120 38 - 126 U/L   Total Bilirubin 0.6 0.3 - 1.2 mg/dL   GFR, Estimated >60 >60 mL/min    Comment: (NOTE) Calculated using the CKD-EPI Creatinine Equation (2021)    Anion gap 16 (H) 5 - 15    Comment: Performed at [facility], [place] [address] [place], [place] [address]  CBC WITH DIFFERENTIAL     Status: Abnormal   Collection Time: 06/16/20  3:32 PM  Result Value Ref Range   WBC 23.5 (H) 4.0 - 10.5 K/uL   RBC 4.32 3.87 - 5.11 MIL/uL   Hemoglobin 11.1 (L) 12.0 - 15.0 g/dL   HCT 34.5 (L) 36.0 - 46.0 %   MCV 79.9 (L) 80.0 - 100.0 fL   MCH 25.7 (L) 26.0 - 34.0 pg   MCHC 32.2 30.0 -  36.0 g/dL   RDW 19.9 (H) 11.5 - 15.5 %   Platelets 459 (H) 150 - 400 K/uL   nRBC 0.0 0.0 - 0.2 %   Neutrophils Relative % 81 %   Neutro Abs 18.9 (H) 1.7 - 7.7 K/uL   Lymphocytes Relative 9 %   Lymphs Abs 2.1 0.7 - 4.0 K/uL   Monocytes Relative 7 %   Monocytes Absolute 1.7 (H) 0.1 - 1.0 K/uL   Eosinophils Relative 0 %   Eosinophils Absolute 0.0 0.0 - 0.5 K/uL   Basophils Relative 0 %    Basophils Absolute 0.1 0.0 - 0.1 K/uL   Immature Granulocytes 3 %   Abs Immature Granulocytes 0.65 (H) 0.00 - 0.07 K/uL    Comment: Performed at [facility], [place] [address], [place], [place] [address]  Protime-INR     Status: None   Collection Time: 06/16/20  3:32 PM  Result Value Ref Range   Prothrombin Time 13.5 11.4 - 15.2 seconds   INR 1.1 0.8 - 1.2    Comment: (NOTE) INR goal varies based on device and disease states. Performed at [facility], [place] [address] [place], [place] [address]   APTT     Status: Abnormal   Collection Time: 06/16/20  3:32 PM  Result Value Ref Range   aPTT 39 (H) 24 - 36 seconds    Comment:        IF BASELINE aPTT IS ELEVATED, SUGGEST PATIENT RISK ASSESSMENT BE USED TO DETERMINE APPROPRIATE ANTICOAGULANT THERAPY. Performed at [facility], [place] [address] [place], [place] [address]   Blood Culture (routine x 2)     Status: None (Preliminary result)   Collection Time: 06/16/20  3:32 PM   Specimen: BLOOD  Result Value Ref Range   Specimen Description      BLOOD RIGHT ANTECUBITAL Performed at [facility], [place] [address], [place], [place] [address]    Special Requests      BOTTLES DRAWN AEROBIC AND ANAEROBIC Blood Culture adequate volume Performed at [place] [address] [place], [place] [address]    Culture      NO GROWTH 2 DAYS Performed at [facility], [place] [address] [place],  [address]    Report Status PENDING   POC occult blood, ED     Status: None   Collection Time: 06/16/20  3:36 PM  Result Value Ref Range   Fecal Occult Bld NEGATIVE NEGATIVE  I-Stat Chem 8, ED     Status: Abnormal   Collection Time: 06/16/20  3:53 PM  Result Value Ref Range   Sodium 137 135 - 145 mmol/L   Potassium 2.8 (L) 3.5 - 5.1 mmol/L   Chloride 99 98 - 111 mmol/L   BUN 35 (H) 6 - 20 mg/dL   Creatinine, Ser 0.90 0.44 - 1.00 mg/dL    Glucose, Bld 122 (H) 70 - 99 mg/dL    Comment: Glucose reference range applies only to samples taken after fasting for at least 8 hours.   Calcium, Ion 1.17 1.15 - 1.40 mmol/L   TCO2 28 22 - 32 mmol/L   Hemoglobin 11.6 (L) 12.0 - 15.0 g/dL   HCT 34.0 (L) 36.0 - 46.0 %  Blood Culture (routine x 2)     Status: None (Preliminary result)   Collection Time: 06/16/20  4:00 PM   Specimen: BLOOD RIGHT HAND  Result Value Ref Range   Specimen Description  BLOOD RIGHT HAND Performed at [facility], [place] [address] [place], [place] [address]    Special Requests      BOTTLES DRAWN AEROBIC AND ANAEROBIC Blood Culture results may not be optimal due to an inadequate volume of blood received in culture bottles Performed at [facility] [place], [place] [address], [place], [place] [address]    Culture      NO GROWTH 2 DAYS Performed at [facility], [place] [address] [place], [place] [address]    Report Status PENDING   Magnesium     Status: Abnormal   Collection Time: 06/16/20  4:40 PM  Result Value Ref Range   Magnesium 1.6 (L) 1.7 - 2.4 mg/dL    Comment: Performed at [facility], [place] [address] [place], [place] [address]  Lactic acid, plasma     Status: Abnormal   Collection Time: 06/16/20  6:50 PM  Result Value Ref Range   Lactic Acid, Venous 4.1 (HH) 0.5 - 1.9 mmol/L    Comment: CRITICAL RESULT CALLED TO, READ BACK BY AND VERIFIED WITH: BREWER,B. RN @1939  ON 01.24.2022 BY COHEN,K Performed at [facility], [place] [address] [place], [place] [address]   Urinalysis, Routine w reflex microscopic     Status: Abnormal   Collection Time: 06/16/20  8:28 PM  Result Value Ref Range   Color, Urine YELLOW YELLOW   APPearance CLOUDY (A) CLEAR   Specific Gravity, Urine 1.025 1.005 - 1.030   pH 5.0 5.0 - 8.0   Glucose, UA NEGATIVE NEGATIVE mg/dL   Hgb urine dipstick MODERATE (A) NEGATIVE   Bilirubin Urine NEGATIVE NEGATIVE    Ketones, ur NEGATIVE NEGATIVE mg/dL   Protein, ur NEGATIVE NEGATIVE mg/dL   Nitrite NEGATIVE NEGATIVE   Leukocytes,Ua LARGE (A) NEGATIVE   RBC / HPF 11-20 0 - 5 RBC/hpf   WBC, UA >50 (H) 0 - 5 WBC/hpf   Bacteria, UA RARE (A) NONE SEEN   Squamous Epithelial / LPF 0-5 0 - 5   Mucus PRESENT     Comment: Performed at [facility], [place] [address], [place], [place] [address]  Urine culture     Status: Abnormal (Preliminary result)   Collection Time: 06/16/20  8:28 PM   Specimen: In/Out Cath Urine  Result Value Ref Range   Specimen Description      IN/OUT CATH URINE Performed at [facility], [place] [address], [place], [place] [address]    Special Requests      NONE Performed at [facility], [place] [address], [place], [place] [address]    Culture (A)     >=100,000 COLONIES/mL ESCHERICHIA COLI SUSCEPTIBILITIES TO FOLLOW Performed at [place] [address] [place], [place] [address]    Report Status PENDING   Procalcitonin     Status: None   Collection Time: 06/16/20  9:01 PM  Result Value Ref Range   Procalcitonin 1.98 ng/mL    Comment:        Interpretation: PCT > 0.5 ng/mL and <= 2 ng/mL: Systemic infection (sepsis) is possible, but other conditions are known to elevate PCT as well. (NOTE)       Sepsis PCT Algorithm           Lower Respiratory Tract  Infection PCT Algorithm    ----------------------------     ----------------------------         PCT < 0.25 ng/mL                PCT < 0.10 ng/mL          Strongly encourage             Strongly discourage   discontinuation of antibiotics    initiation of antibiotics    ----------------------------     -----------------------------       PCT 0.25 - 0.50 ng/mL            PCT 0.10 - 0.25 ng/mL               OR       >80% decrease in PCT            Discourage initiation of                                            antibiotics       Encourage discontinuation           of antibiotics    ----------------------------     -----------------------------         PCT >= 0.50 ng/mL              PCT 0.26 - 0.50 ng/mL                AND       <80% decrease in PCT             Encourage initiation of                                             antibiotics       Encourage continuation           of antibiotics    ----------------------------     -----------------------------        PCT >= 0.50 ng/mL                  PCT > 0.50 ng/mL               AND         increase in PCT                  Strongly encourage                                      initiation of antibiotics    Strongly encourage escalation           of antibiotics                                     -----------------------------                                           PCT <= 0.25 ng/mL  OR                                        > 80% decrease in PCT                                      Discontinue / Do not initiate                                             antibiotics  Performed at [place] [address], [place], [place] [address]   Troponin I (High Sensitivity)     Status: Abnormal   Collection Time: 06/16/20  9:22 PM  Result Value Ref Range   Troponin I (High Sensitivity) 112 (HH) <18 ng/L    Comment: CRITICAL RESULT CALLED TO, READ BACK BY AND VERIFIED WITH: REBECCA, RN @ 0129 ON 06/17/20 C VARNER (NOTE) Elevated high sensitivity troponin I (hsTnI) values and significant  changes across serial measurements may suggest ACS but many other  chronic and acute conditions are known to elevate hsTnI results.  Refer to the Links section for chest pain algorithms and additional  guidance. Performed at [facility], [place] [address], [place], [place] [address]   Lactic acid, plasma     Status: None   Collection Time: 06/16/20 11:00 PM  Result Value Ref Range    Lactic Acid, Venous 1.8 0.5 - 1.9 mmol/L    Comment: Performed at [facility], [place] [address], [place], [place] [address]  HIV Antibody (routine testing w rflx)     Status: None   Collection Time: 06/16/20 11:22 PM  Result Value Ref Range   HIV Screen 4th Generation wRfx Non Reactive Non Reactive    Comment: Performed at [facility], [place] [address], [place], [place] [address]  CBC     Status: Abnormal   Collection Time: 06/16/20 11:22 PM  Result Value Ref Range   WBC 19.6 (H) 4.0 - 10.5 K/uL   RBC 3.72 (L) 3.87 - 5.11 MIL/uL   Hemoglobin 9.7 (L) 12.0 - 15.0 g/dL   HCT 29.6 (L) 36.0 - 46.0 %   MCV 79.6 (L) 80.0 - 100.0 fL   MCH 26.1 26.0 - 34.0 pg   MCHC 32.8 30.0 - 36.0 g/dL   RDW 19.9 (H) 11.5 - 15.5 %   Platelets 445 (H) 150 - 400 K/uL   nRBC 0.2 0.0 - 0.2 %    Comment: Performed at [facility], [place] [address], [place],  [address]  Creatinine, serum     Status: None   Collection Time: 06/16/20 11:22 PM  Result Value Ref Range   Creatinine, Ser 0.56 0.44 - 1.00 mg/dL   GFR, Estimated >60 >60 mL/min    Comment: (NOTE) Calculated using the CKD-EPI Creatinine Equation (2021) Performed at [facility], [place] [address] [place], [place] [address]   Lactic acid, plasma     Status: Abnormal   Collection Time: 06/17/20  2:30 AM  Result Value Ref Range   Lactic Acid, Venous 2.9 (HH) 0.5 - 1.9 mmol/L    Comment: CRITICAL RESULT CALLED TO, READ BACK BY AND VERIFIED  WITHSheria Lang, RN @ [phone] ON 06/17/20 Riesa Pope Performed at [facility], [address] [place] [address] [place], [place] [address]   Troponin I (High Sensitivity)     Status: Abnormal   Collection Time: 06/17/20  2:30 AM  Result Value Ref Range   Troponin I (High Sensitivity) 100 (HH) <18 ng/L    Comment: CRITICAL VALUE NOTED.  VALUE IS CONSISTENT WITH PREVIOUSLY REPORTED AND CALLED VALUE. (NOTE) Elevated high sensitivity troponin I (hsTnI) values  and significant  changes across serial measurements may suggest ACS but many other  chronic and acute conditions are known to elevate hsTnI results.  Refer to the Links section for chest pain algorithms and additional  guidance. Performed at [facility], [address], [place], [place] [address]   CBC     Status: Abnormal   Collection Time: 06/17/20  4:36 AM  Result Value Ref Range   WBC 18.1 (H) 4.0 - 10.5 K/uL   RBC 3.31 (L) 3.87 - 5.11 MIL/uL   Hemoglobin 8.6 (L) 12.0 - 15.0 g/dL   HCT 86.5 (L) 78.4 - 69.6 %   MCV 79.5 (L) 80.0 - 100.0 fL   MCH 26.0 26.0 - 34.0 pg   MCHC 32.7 30.0 - 36.0 g/dL   RDW 29.5 (H) 28.4 - 13.2 %   Platelets 401 (H) 150 - 400 K/uL   nRBC 0.0 0.0 - 0.2 %    Comment: Performed at [facility], [address] [place], [place] [address]  Comprehensive metabolic panel     Status: Abnormal   Collection Time: 06/17/20  4:36 AM  Result Value Ref Range   Sodium 136 135 - 145 mmol/L   Potassium 2.8 (L) 3.5 - 5.1 mmol/L   Chloride 100 98 - 111 mmol/L   CO2 24 22 - 32 mmol/L   Glucose, Bld 103 (H) 70 - 99 mg/dL    Comment: Glucose reference range applies only to samples taken after fasting for at least 8 hours.   BUN 21 (H) 6 - 20 mg/dL   Creatinine, Ser 2.72 0.44 - 1.00 mg/dL   Calcium 8.5 (L) 8.9 - 10.3 mg/dL   Total Protein 5.1 (L) 6.5 - 8.1 g/dL   Albumin 1.9 (L) 3.5 - 5.0 g/dL   AST 18 15 - 41 U/L   ALT 16 0 - 44 U/L   Alkaline Phosphatase 94 38 - 126 U/L   Total Bilirubin 0.5 0.3 - 1.2 mg/dL   GFR, Estimated >53 >66 mL/min    Comment: (NOTE) Calculated using the CKD-EPI Creatinine Equation (2021)    Anion gap 12 5 - 15    Comment: Performed at [facility], [address], [place], [place] [address]  Magnesium     Status: None   Collection Time: 06/17/20  4:36 AM  Result Value Ref Range   Magnesium 2.1 1.7 - 2.4 mg/dL    Comment: Performed at [facility], [address] [place] [address], [place], [place] [address]  CBC     Status: Abnormal   Collection Time: 06/18/20  7:20 AM  Result Value Ref Range   WBC 14.9 (H) 4.0 - 10.5 K/uL   RBC 3.31 (L) 3.87 - 5.11 MIL/uL   Hemoglobin 8.6 (L) 12.0 - 15.0 g/dL   HCT 56.3 (L) 87.5 - 64.3 %   MCV 80.7 80.0 - 100.0 fL   MCH 26.0 26.0 - 34.0 pg   MCHC 32.2 30.0 - 36.0 g/dL  RDW 19.9 (H) 11.5 - 15.5 %   Platelets 413 (H) 150 - 400 K/uL   nRBC 0.0 0.0 - 0.2 %    Comment: Performed at [facility], [place] [address] [place], [place] [address]  Comprehensive metabolic panel     Status: Abnormal   Collection Time: 06/18/20  7:20 AM  Result Value Ref Range   Sodium 137 135 - 145 mmol/L   Potassium 2.7 (LL) 3.5 - 5.1 mmol/L    Comment: CRITICAL RESULT CALLED TO, READ BACK BY AND VERIFIED WITH: KUSNITZ,S @ 0835 ON [id number] BY POTEAT,S    Chloride 100 98 - 111 mmol/L   CO2 25 22 - 32 mmol/L   Glucose, Bld 122 (H) 70 - 99 mg/dL    Comment: Glucose reference range applies only to samples taken after fasting for at least 8 hours.   BUN 10 6 - 20 mg/dL   Creatinine, Ser 0.72 0.44 - 1.00 mg/dL   Calcium 8.4 (L) 8.9 - 10.3 mg/dL   Total Protein 5.0 (L) 6.5 - 8.1 g/dL   Albumin 1.9 (L) 3.5 - 5.0 g/dL   AST 18 15 - 41 U/L   ALT 16 0 - 44 U/L   Alkaline Phosphatase 86 38 - 126 U/L   Total Bilirubin 0.5 0.3 - 1.2 mg/dL   GFR, Estimated >60 >60 mL/min    Comment: (NOTE) Calculated using the CKD-EPI Creatinine Equation (2021)    Anion gap 12 5 - 15    Comment: Performed at [facility], [place] [address] [place], [place] [address]  Magnesium     Status: Abnormal   Collection Time: 06/18/20  7:20 AM  Result Value Ref Range   Magnesium 1.4 (L) 1.7 - 2.4 mg/dL    Comment: Performed at [facility], [place] [address], [place], [place] [address]  Phosphorus     Status: Abnormal   Collection Time: 06/18/20  7:20 AM  Result Value Ref Range   Phosphorus 1.8 (L) 2.5 - 4.6 mg/dL     Comment: Performed at [facility], [place] [address] [place], [place] [address]  Lactic acid, plasma     Status: None   Collection Time: 06/18/20  7:20 AM  Result Value Ref Range   Lactic Acid, Venous 1.1 0.5 - 1.9 mmol/L    Comment: Performed at [facility], [place] [address], [place], [place] [address]  Troponin I (High Sensitivity)     Status: Abnormal   Collection Time: 06/18/20  7:20 AM  Result Value Ref Range   Troponin I (High Sensitivity) 58 (H) <18 ng/L    Comment: DELTA CHECK NOTED (NOTE) Elevated high sensitivity troponin I (hsTnI) values and significant  changes across serial measurements may suggest ACS but many other  chronic and acute conditions are known to elevate hsTnI results.  Refer to the Links section for chest pain algorithms and additional  guidance. Performed at [facility], [place] [address], [place], [name] [address]   Hemoglobin A1c     Status: Abnormal   Collection Time: 06/18/20  7:20 AM  Result Value Ref Range   Hgb A1c MFr Bld 6.5 (H) 4.8 - 5.6 %    Comment: (NOTE) Pre diabetes:          5.7%-6.4%  Diabetes:              >6.4%  Glycemic control for   <7.0% adults with diabetes    Mean Plasma  Glucose 139.85 mg/dL    Comment: Performed at [facility], [place] [address] [place], [place] [address]   CT Abdomen Pelvis W Contrast  Result Date: 06/16/2020 CLINICAL DATA:  Sepsis, sacral wound. EXAM: CT ABDOMEN AND PELVIS WITH CONTRAST TECHNIQUE: Multidetector CT imaging of the abdomen and pelvis was performed using the standard protocol following bolus administration of intravenous contrast. CONTRAST:  139mL OMNIPAQUE IOHEXOL 300 MG/ML  SOLN COMPARISON:  None. FINDINGS: Lower chest: No acute abnormality. Hepatobiliary: No focal hepatic abnormality. Gallbladder unremarkable. Pancreas: No focal abnormality or ductal dilatation. Spleen: No focal abnormality.  Normal size. Adrenals/Urinary Tract: No  adrenal abnormality. No focal renal abnormality. No stones or hydronephrosis. Urinary bladder is unremarkable. Stomach/Bowel: Large stool burden in the rectosigmoid colon. No evidence of bowel obstruction. Vascular/Lymphatic: Aortic atherosclerosis. No evidence of aneurysm or adenopathy. Reproductive: Fibroid uterus, unchanged.  No adnexal mass. Other: No free fluid or free air. Musculoskeletal: Large open sacral decubitus ulcer/wound noted. This extends to the coccyx without definite acute osteomyelitis. IMPRESSION: Large sacral wound extends down to the coccyx without definitive change of acute osteomyelitis. Large stool burden in the rectosigmoid colon concerning for fecal impaction. Fibroid uterus. Aortic atherosclerosis. Electronically Signed   By: Rolm Baptise M.D.   On: 06/16/2020 19:23   DG Chest Port 1 View  Result Date: 06/16/2020 CLINICAL DATA:  Possible sepsis. EXAM: PORTABLE CHEST 1 VIEW COMPARISON:  05/13/2020 FINDINGS: 1530 hours. The lungs are clear without focal pneumonia, edema, pneumothorax or pleural effusion. Linear atelectasis or scarring noted right base. Interstitial markings are diffusely coarsened with chronic features. The cardiopericardial silhouette is within normal limits for size. The visualized bony structures of the thorax show no acute abnormality. IMPRESSION: No active disease. Electronically Signed   By: Misty Stanley M.D.   On: 06/16/2020 15:52   . dextrose 5% lactated ringers 100 mL/hr at 06/18/20 1136  . magnesium sulfate bolus IVPB 4 g (06/18/20 1230)  . piperacillin-tazobactam (ZOSYN)  IV 3.375 g (06/18/20 1036)  . potassium PHOSPHATE IVPB (in mmol)    . vancomycin Stopped (06/18/20 0039)     Assessment/Plan Multiple sclerosis, left hemiplegia -limited mobility COVID 19 virus infection with acute hypoxic respiratory failure 12/21-04/2620 Tobacco use Hx constipation Hypertension Hx colostomy/colostomy reversal On multiple medications Hypothyroid Anemia   Hx AKI Probable malnutrition   Stage IV right buttocks decubitus ulcer/osteomyelitis Sepsis with decubitus ulcer and UTI UTI - > 100 K E. coli  FEN: IV fluids/heart healthy/Cardama diet ID: Zosyn 1/24 >> day 3 DVT: Heparin Follow-up: [facility] wound clinic  Plan: Continue current wet-to-dry treatments today. Continue antibiotics. I will get a prealbumin with a.m. labs tomorrow. We will plan to take her to the OR for debridement of the sacral decubitus tomorrow. Once it is cleaned up in the OR we go back to wet-to-dry dressings. She will need to continue follow-up with the wound center clinic at [facility] as before. We can clean this up. She is bedbound currently and this probably will not heal. I would work on her nutrition and also get infectious disease to look at her and discuss treatment for probable osteomyelitis. Of also ordered a stool occult to be checked, will defer to medicine for follow-up on this. Discussed surgery and care with her daughter [name], [phone].  Earnstine Regal [facility] 06/18/2020, 12:02 PM Please see Amion for pager number during day hours 7:00am-4:30pm

## 2020-06-18 NOTE — Consult Note (Signed)
[place] Nurse Consult Note: Reason for Consult: Sacral wound, Stage 4. Dr. Harlow Asa and [facility] PA-C Will Creig Hines have just seen.  Plan is for OR tomorrow to clean wound and debride nonviable tissue; will likely continue NS dressings afterward.Nutrition is a concern. Following review of prealbumin in am, formulating a plan addressing nutrition is recommended. Wound type:Pressure Pressure Injury POA: Yes Measurement:16cm x 10cm x 4cm. There is a ridge of nonviable tissue from 7-10 o'clock at peripheral edge. Wound bed:75% red, 25% black, nonviable tissue.  Bone palpable. Periwound with scattered areas of skin loss. See above for mention of nonviable tissue from 7-10 o'clock. A mattress replacement is indicated as are bilateral pressure redistribution heel boots. Both are ordered today.  [place] nursing team will not follow, but will remain available to this patient, the nursing and medical teams.  Please re-consult if needed. Thanks, Maudie Flakes, MSN, RN, [place], Arther Abbott  Pager# [phone]

## 2020-06-18 NOTE — Progress Notes (Signed)
PROGRESS NOTE  Tina Griffith  DOB: 1961-03-15  PCP: [place] ACZ:[id number]  DOA: 06/16/2020  LOS: 2 days   Chief Complaint  Patient presents with  . Wound Infection  . Fatigue   Brief narrative: Tina Griffith is a 60 y.o. female with PMH significant for multiple sclerosis, movement disorder, hypertension, constipation and COVID-19 pneumonia in December 2021.  At baseline, she was using a walker and wheelchair up until [place] admission.  Since then she has remained bedbound at home and has developed sacral decubitus ulcers. 1/24, patient was brought to the ED with complaint of failure to thrive, worsening of gluteal/sacral pressure sores.  Since last hospitalization 4 weeks ago for Covid pneumonia, patient has had anorexia, weight loss, generalized fatigue and worsening lower extremity edema.  In the ED, patient was tachycardic to 110s. Labs showed potassium low at 2.8, WBC count elevated to 23.5.  Lactic acid was initially normal but later spiked to 2.9. Chest x-ray normal Urinalysis with cloudy urine, large leukocytes CT abdomen/pelvis showed sacral decubitus with involvement down to coccyx but without overt signs of acute osteomyelitis. There was a concern of septic shock and hence patient was admitted to critical care service.  In chart review, however patient was never had a drop in blood pressure and never required pressors. Patient was transferred to hospitalist service.  Subjective: Patient was seen and examined this morning. Middle-aged African-American female.  Looks older for her age.  On low-flow oxygen.  Alert, awake, oriented x3.  I talked to her about advanced directives.  She wants everything done.  Not interested in current care at this time. Chart reviewed. Labs this morning with phosphorus low at 1.8, magnesium low at 1.4, albumin low at 1.9, potassium level pending, WC count improving at 14.9, hemoglobin low at 8.6  Assessment/Plan: Sepsis - POA,  no evidence of septic shock -Likely source sacral decubitus ulcer.  Also concern of UTI. -Met criteria with leukocytosis, tachycardia elevated lactic acid and source of infection. -Blood culture and urine cultures sent.   -Currently on broad-spectrum antibiotics. -WBC count and lactic acid level improving. -Continue IV hydration with D5 LR at 100 mill per hour. Recent Labs  Lab 06/16/20 1532 06/16/20 1850 06/16/20 2101 06/16/20 2300 06/16/20 2322 06/17/20 0230 06/17/20 0436 06/18/20 0720  WBC 23.5*  --   --   --  19.6*  --  18.1* 14.9*  LATICACIDVEN 1.6 4.1*  --  1.8  --  2.9*  --  1.1  PROCALCITON  --   --  1.98  --   --   --   --   --    Stage IV sacral decubitus ulcers - POA -Wound care consultation obtained.  Apparently patient had debridement done at wound care clinic on 1/20.  The wound was noted to have extension to the fascia.  Per wound care recommendation yesterday, I called for surgical consultation today.  Progressive generalized physical and mental decline -Multifactorial: Multiple sclerosis, movement disorder, recent Covid infection, multiple mood altering medications -PT evaluation ordered. -Home MS  meds - dalfampridine, ocrelizumab -Chronic pain and other CNS meds- percocet, baclofen, norco, celexa, gabapentin, naloxone, ultram, MSIR 30 bid.  It is unclear if patient was taking all of them -Minimize the use of mood altering medications as much as possible.   -Currently on OxyIR 5 mg 3 times daily as needed. -Resume baclofen, dalfampridine and ocrelizumab for MS  Essential hypertension -Home blood pressure meds include HCTZ 25 mg daily, metoprolol succinate 100 mg  daily.   -Resume metoprolol as tartrate 25 mg twice daily.  Keep HCTZ on hold   Diabetes mellitus -A1c 6.5 on 1/26 -Home meds include Victoza 0.6 mg daily. -Currently on sliding scale insulin with Accu-Cheks. Recent Labs  Lab 06/18/20 1202  GLUCAP 148*   Elevated troponin -Troponin was  initially elevated to 112, trended down. -Currently not having anginal symptoms. -Echocardiogram pending -Continue aspirin and statin Recent Labs    06/16/20 2122 06/17/20 0230 06/18/20 0720  TROPONINIHS 112* 100* 58*   Hypokalemia/hypomagnesemia/hypophosphatemia -Labs from this morning with potassium low at 2.7, magnesium low at 1.4 and phosphorus low at 1.8. -Start aggressive replacement with IV and oral. -Repeat tomorrow. Recent Labs  Lab 06/16/20 1532 06/16/20 1553 06/16/20 1640 06/17/20 0436 06/18/20 0720  K 2.8* 2.8*  --  2.8* 2.7*  MG  --   --  1.6* 2.1 1.4*  PHOS  --   --   --   --  1.8*   Hx of IBS  -on trulance and PPI  Anemia of chronic disease -No active bleeding but hemoglobin down from 11.1 on admission to 8.6 today.  May be dilutional.  Continue to monitor. Recent Labs    06/16/20 1532 06/16/20 1553 06/16/20 2322 06/17/20 0436 06/18/20 0720  HGB 11.1* 11.6* 9.7* 8.6* 8.6*  MCV 79.9*  --  79.6* 79.5* 80.7   Mobility: Pending PT eval Code Status:   Code Status: Full Code  Nutritional status: Body mass index is 27.46 kg/m.     Diet Order            Diet heart healthy/carb modified Room service appropriate? Yes; Fluid consistency: Thin  Diet effective now                 DVT prophylaxis: heparin injection 5,000 Units Start: 06/16/20 2330 SCDs Start: 06/16/20 2323   Antimicrobials:  IV Vanco and Zosyn Fluid: D5 LR at 100 mL/h Consultants: Critical care signed off Family Communication:  None at bedside  Status is: Inpatient  Remains inpatient appropriate because: Needs surgical evaluation of the wound  Dispo: The patient is from: Home              Anticipated d/c is to: Home versus SNF, pending PT eval              Anticipated d/c date is: 3 days              Patient currently is not medically stable to d/c.   Difficult to place patient No     Infusions:  . dextrose 5% lactated ringers 100 mL/hr at 06/18/20 1136  . magnesium  sulfate bolus IVPB    . piperacillin-tazobactam (ZOSYN)  IV 3.375 g (06/18/20 1036)  . potassium PHOSPHATE IVPB (in mmol)    . vancomycin Stopped (06/18/20 0039)    Scheduled Meds: . (feeding supplement) PROSource Plus  30 mL Oral TID WC  . aspirin  81 mg Oral Daily  . benzonatate  100 mg Oral TID  . Chlorhexidine Gluconate Cloth  6 each Topical Daily  . dalfampridine  10 mg Oral BID  . ferrous sulfate  325 mg Oral BID  . fluticasone furoate-vilanterol  1 puff Inhalation Daily  . heparin  5,000 Units Subcutaneous Q8H  . insulin aspart  0-5 Units Subcutaneous QHS  . insulin aspart  0-9 Units Subcutaneous TID WC  . metoprolol tartrate  25 mg Oral BID  . multivitamin with minerals  1 tablet Oral Daily  .  oxybutynin  10 mg Oral Daily  . pantoprazole (PROTONIX) IV  40 mg Intravenous QHS  . phosphorus  500 mg Oral Daily  . Plecanatide  1 tablet Oral Daily  . potassium chloride  40 mEq Oral Q2H  . pravastatin  20 mg Oral q1800  . sodium hypochlorite   Topical BID  . tiZANidine  8 mg Oral TID    Antimicrobials: Anti-infectives (From admission, onward)   Start     Dose/Rate Route Frequency Ordered Stop   06/17/20 1200  vancomycin (VANCOREADY) IVPB 750 mg/150 mL        750 mg 150 mL/hr over 60 Minutes Intravenous Every 12 hours 06/16/20 2115     06/17/20 0015  piperacillin-tazobactam (ZOSYN) IVPB 3.375 g        3.375 g 12.5 mL/hr over 240 Minutes Intravenous Every 8 hours 06/17/20 0004     06/17/20 0000  piperacillin-tazobactam (ZOSYN) IVPB 3.375 g  Status:  Discontinued        3.375 g 100 mL/hr over 30 Minutes Intravenous Every 6 hours 06/16/20 2329 06/17/20 0003   06/16/20 2200  cefTRIAXone (ROCEPHIN) 2 g in sodium chloride 0.9 % 100 mL IVPB  Status:  Discontinued        2 g 200 mL/hr over 30 Minutes Intravenous Every 24 hours 06/16/20 2101 06/16/20 2329   06/16/20 2115  vancomycin (VANCOREADY) IVPB 1500 mg/300 mL        1,500 mg 150 mL/hr over 120 Minutes Intravenous  Once  06/16/20 2106 06/17/20 0235   06/16/20 1545  piperacillin-tazobactam (ZOSYN) IVPB 3.375 g        3.375 g 100 mL/hr over 30 Minutes Intravenous  Once 06/16/20 1533 06/16/20 1633      PRN meds: docusate sodium, Menthol-Zinc Oxide, oxyCODONE, polyethylene glycol, sodium chloride flush   Objective: Vitals:   06/18/20 0635 06/18/20 0951  BP: (!) 102/54 (!) 143/66  Pulse: (!) 56 65  Resp: 18 16  Temp: 98.5 F (36.9 C) 98.2 F (36.8 C)  SpO2: 100% 97%    Intake/Output Summary (Last 24 hours) at 06/18/2020 1204 Last data filed at 06/18/2020 1000 Gross per 24 hour  Intake 2355.15 ml  Output 1200 ml  Net 1155.15 ml   Filed Weights   06/17/20 1145  Weight: 74.8 kg   Weight change:  Body mass index is 27.46 kg/m.   Physical Exam: General exam: Pleasant middle-aged African-American female.  Chronically sick looking Skin: No rashes, lesions or ulcers. HEENT: Atraumatic, normocephalic, no obvious bleeding Lungs: Clear to auscultation bilaterally CVS: Regular rate and rhythm, no murmur GI/Abd soft, nontender, nondistended, bowel sound present Back: Deferred to examination of wound care nurse and surgery for status of sacral ulcer CNS: Alert, awake, oriented x3 Psychiatry: Mood appropriate Extremities: No pedal edema, no calf tenderness  Data Review: I have personally reviewed the laboratory data and studies available.  Recent Labs  Lab 06/16/20 1532 06/16/20 1553 06/16/20 2322 06/17/20 0436 06/18/20 0720  WBC 23.5*  --  19.6* 18.1* 14.9*  NEUTROABS 18.9*  --   --   --   --   HGB 11.1* 11.6* 9.7* 8.6* 8.6*  HCT 34.5* 34.0* 29.6* 26.3* 26.7*  MCV 79.9*  --  79.6* 79.5* 80.7  PLT 459*  --  445* 401* 413*   Recent Labs  Lab 06/16/20 1532 06/16/20 1553 06/16/20 1640 06/16/20 2322 06/17/20 0436 06/18/20 0720  NA 138 137  --   --  136 137  K 2.8* 2.8*  --   --  2.8* 2.7*  CL 97* 99  --   --  100 100  CO2 25  --   --   --  24 25  GLUCOSE 125* 122*  --   --  103*  122*  BUN 41* 35*  --   --  21* 10  CREATININE 0.88 0.90  --  0.56 0.73 0.72  CALCIUM 9.3  --   --   --  8.5* 8.4*  MG  --   --  1.6*  --  2.1 1.4*  PHOS  --   --   --   --   --  1.8*    F/u labs ordered  Signed, Terrilee Croak, MD [facility] 06/18/2020

## 2020-06-18 NOTE — Consult Note (Addendum)
[facility] Surgery Consult Note  Tina Griffith 1961-04-02  [id number].    Requesting MD: B Dahal Chief Complaint: Failure to thrive, dark tarry stools, worsening buttocks wound. Reason for Consult: buttocks decubitus   HPI:   Patient is a 60 year old woman with history of MS and left hemiplegia recently admitted to [facility] with Covid infection respiratory failure.  She was discharged from there apparently with a sacral wound.  She had limited mobility, she was up in a wheelchair, able to bear weight but not ambulating prior to that admission.  She presented here with the above-noted complaints.  Work-up so far shows she is afebrile but tachycardic.  Blood pressure is stable.  Sats are good on 2 L nasal cannula.  Admission labs shows a potassium of 2.8, glucose of 103, creatinine 0.73, calcium 8.5, albumin 1.9, AST 18 ALT 16 total bilirubin 0.5.  Lactate was 2.9, WBC 19.  H/H 9.7/29.6, platelets 445,000.  Hemoglobin A1c 6.5. There is no Covid currently for this admission. CT of the abdomen pelvis with contrast showed a large sacral wound extends down to the coccyx without definitive changes of osteomyelitis. There is also a large stool burden in the rectosigmoid colon.  ROS: Review of Systems  Constitutional: Positive for malaise/fatigue.  HENT: Negative.   Eyes:       Decreased vision both eyes secondary to cataracts.  Respiratory: Negative.   Cardiovascular: Negative.   Gastrointestinal: Negative.        She is having black-green-colored stool. She says she is not eating well at home.  Musculoskeletal: Positive for back pain and myalgias.  Skin:       Progressive worsening of the sacral decubitus wound.  Neurological: Negative.   Endo/Heme/Allergies: Negative.   Psychiatric/Behavioral: Positive for depression.    Family History  Problem Relation Age of Onset  . Lung cancer Mother   . Stroke Mother   . Diabetes Father     Past Medical History:   Diagnosis Date  . Constipation   . Hypertension   . Movement disorder   . MS (multiple sclerosis) ([place])   . Vision abnormalities     Past Surgical History:  Procedure Laterality Date  . COLOSTOMY    . COLOSTOMY REVERSAL    . HERNIA REPAIR    . KNEE SURGERY      Social History:  reports that she has been smoking cigarettes. She has been smoking about 1.00 pack per day. She has never used smokeless tobacco. She reports that she does not drink alcohol and does not use drugs.  Allergies:  Allergies  Allergen Reactions  . No Known Allergies     Medications Prior to Admission  Medication Sig Dispense Refill  . Ascorbic Acid (VITAMIN C) 1000 MG tablet Take 1,000 mg by mouth in the morning and at bedtime.    . ASPIRIN LOW DOSE 81 MG EC tablet Take 81 mg by mouth daily.    . benzonatate (TESSALON) 100 MG capsule Take 100 mg by mouth 3 (three) times daily.    Marland Kitchen dalfampridine 10 MG TB12 Take 1 tablet (10 mg total) by mouth 2 (two) times daily. 60 tablet 11  . FEROSUL 325 (65 Fe) MG tablet Take 325 mg by mouth 2 (two) times daily.    . hydrochlorothiazide (HYDRODIURIL) 25 MG tablet Take 25 mg by mouth daily.    Marland Kitchen liraglutide (VICTOZA) 18 MG/3ML SOPN Inject 0.6 mg into the skin daily.    Marland Kitchen LIVALO 2 MG TABS  Take 1 tablet by mouth daily.    . metoprolol succinate (TOPROL-XL) 100 MG 24 hr tablet Take 100 mg by mouth daily.    . Misc Natural Products (AIRBORNE ELDERBERRY) CHEW Chew 2 tablets by mouth daily.    . Multiple Vitamin (MULTIVITAMIN ADULT) TABS Take 1 tablet by mouth daily.    Marland Kitchen omeprazole (PRILOSEC) 20 MG capsule Take 20 mg by mouth daily.    Marland Kitchen oxybutynin (DITROPAN-XL) 10 MG 24 hr tablet Take 10 mg by mouth daily.    Marland Kitchen oxyCODONE-acetaminophen (PERCOCET) 10-325 MG tablet Take 1 tablet by mouth 4 (four) times daily.    . potassium chloride (MICRO-K) 10 MEQ CR capsule Take 10 mEq by mouth 2 (two) times daily.    Marland Kitchen tiZANidine (ZANAFLEX) 4 MG tablet TAKE TWO (2) TABLETS THREE (3)  TIMES DAILY (Patient taking differently: Take 8 mg by mouth 3 (three) times daily.) 180 tablet 5  . zinc gluconate 50 MG tablet Take 50 mg by mouth daily.    . baclofen (LIORESAL) 10 MG tablet TAKE ONE TABLET FOUR TIMES DAILY (Patient not taking: No sig reported) 120 each 11  . CALMOSEPTINE 0.44-20.6 % OINT Apply 1 application topically 3 (three) times daily as needed (skin irritation).    . citalopram (CELEXA) 20 MG tablet TAKE 1 TABLET DAILY (Patient not taking: No sig reported) 30 tablet 6  . cyclobenzaprine (FLEXERIL) 5 MG tablet Take 1 tablet (5 mg total) by mouth at bedtime. (Patient not taking: No sig reported) 30 tablet 5  . DULoxetine (CYMBALTA) 60 MG capsule Take 1 capsule (60 mg total) by mouth daily. (Patient not taking: No sig reported) 30 capsule 11  . gabapentin (NEURONTIN) 300 MG capsule Take 1 capsule (300 mg total) by mouth 3 (three) times daily. 90 capsule 11  . HYDROcodone-acetaminophen (NORCO) 10-325 MG tablet Take one tablet 4 times daily as needed. (Patient not taking: No sig reported) 120 tablet 0  . methylPREDNISolone (MEDROL DOSEPAK) 4 MG TBPK tablet Take 6 tablets on day 1, 5 tablets on day 2, 4 tablets on day 3, 3 tablets on day 4, 2 tablets on day 5, and 1 tablet on day 6. (Patient not taking: No sig reported) 21 tablet 0  . morphine (MSIR) 30 MG tablet Take 1 tablet (30 mg total) by mouth 2 (two) times daily. (Patient not taking: No sig reported) 60 tablet 0  . Naloxone HCl 0.4 MG/0.4ML SOAJ Use as directed if unable to arouse the patient (Patient not taking: No sig reported) 0.4 mL 2  . Ocrelizumab 300 MG/10ML SOLN Inject 600 mg into the vein every 6 (six) months.    Marland Kitchen omeprazole (PRILOSEC) 20 MG capsule TAKE 1 CAPSULE DAILY 30 MINUTES BEFORE BREAKFAST    . oxybutynin (DITROPAN) 5 MG tablet TAKE 2 TABLETS 3 TIMES A DAY. IF CAUSES DRY MOUTH DECREASE TO 1 TABLET 3 TIMES A DAY (Patient not taking: No sig reported) 180 tablet 11  . polyethylene glycol (MIRALAX / GLYCOLAX)  packet Take 17 g by mouth daily. (Patient not taking: No sig reported) 30 each 5  . sulfamethoxazole-trimethoprim (BACTRIM DS,SEPTRA DS) 800-160 MG tablet Take 1 tablet by mouth 2 (two) times daily. (Patient not taking: No sig reported) 14 tablet 0  . SYMBICORT 160-4.5 MCG/ACT inhaler Inhale 2 puffs into the lungs daily.    . traMADol (ULTRAM) 50 MG tablet Take 1 tablet 3 times daily as needed for pain. (Patient not taking: No sig reported) 90 tablet 2  . TRULANCE  3 MG TABS Take 1 tablet by mouth daily.      Blood pressure (!) 143/66, pulse 65, temperature 98.2 F (36.8 C), temperature source Oral, resp. rate 16, height 5\' 5"  (1.651 m), weight 74.8 kg, SpO2 97 %. Physical Exam:  General: Chronically ill-appearing 60 year old African-American female in no acute distress. HEENT: head is normocephalic, atraumatic.  Sclera are noninjected. Pupils are equal ears and nose without any masses or lesions.  Mouth is pink and moist Heart: regular, rate, and rhythm.  Normal s1,s2. No obvious murmurs, gallops, or rubs noted.  Palpable radial and diminished pedal pulses bilaterally Lungs: CTAB, no wheezes, rhonchi, or rales noted.  Respiratory effort nonlabored Abd: soft, NT, ND, +BS, no masses, hernias, or organomegaly, well-healed midline surgical incision and former colostomy site. MS: Hemiplegic, unable to bear weight, currently strictly bedbound since December. Skin: warm and dry with no masses, lesions, or rashes Neuro: Cranial nerves 2-12 grossly intact, sensation is normal throughout Psych: A&Ox3 with an appropriate affect.     16 x 10 x 4 cm right gluteal decubitus. As you can see from his pictures she is got a fair amount of dead tissue, which is malodorous. You can feel bone when you examine it. She also had a bowel movement while we were there and it was black-green in color. Hemoccult is ordered. There are no Hemoccult cards on the floor.   Results for orders placed or performed during the  hospital encounter of 06/16/20 (from the past 48 hour(s))  Lactic acid, plasma     Status: None   Collection Time: 06/16/20  3:32 PM  Result Value Ref Range   Lactic Acid, Venous 1.6 0.5 - 1.9 mmol/L    Comment: Performed at [facility], [place] [address] [place], [place] [address]  Comprehensive metabolic panel     Status: Abnormal   Collection Time: 06/16/20  3:32 PM  Result Value Ref Range   Sodium 138 135 - 145 mmol/L   Potassium 2.8 (L) 3.5 - 5.1 mmol/L   Chloride 97 (L) 98 - 111 mmol/L   CO2 25 22 - 32 mmol/L   Glucose, Bld 125 (H) 70 - 99 mg/dL    Comment: Glucose reference range applies only to samples taken after fasting for at least 8 hours.   BUN 41 (H) 6 - 20 mg/dL   Creatinine, Ser 0.88 0.44 - 1.00 mg/dL   Calcium 9.3 8.9 - 10.3 mg/dL   Total Protein 6.6 6.5 - 8.1 g/dL   Albumin 2.4 (L) 3.5 - 5.0 g/dL   AST 24 15 - 41 U/L   ALT 21 0 - 44 U/L   Alkaline Phosphatase 120 38 - 126 U/L   Total Bilirubin 0.6 0.3 - 1.2 mg/dL   GFR, Estimated >60 >60 mL/min    Comment: (NOTE) Calculated using the CKD-EPI Creatinine Equation (2021)    Anion gap 16 (H) 5 - 15    Comment: Performed at [facility], [place] [address] [place], [place] [address]  CBC WITH DIFFERENTIAL     Status: Abnormal   Collection Time: 06/16/20  3:32 PM  Result Value Ref Range   WBC 23.5 (H) 4.0 - 10.5 K/uL   RBC 4.32 3.87 - 5.11 MIL/uL   Hemoglobin 11.1 (L) 12.0 - 15.0 g/dL   HCT 34.5 (L) 36.0 - 46.0 %   MCV 79.9 (L) 80.0 - 100.0 fL   MCH 25.7 (L) 26.0 - 34.0 pg   MCHC 32.2 30.0 -  36.0 g/dL   RDW 19.9 (H) 11.5 - 15.5 %   Platelets 459 (H) 150 - 400 K/uL   nRBC 0.0 0.0 - 0.2 %   Neutrophils Relative % 81 %   Neutro Abs 18.9 (H) 1.7 - 7.7 K/uL   Lymphocytes Relative 9 %   Lymphs Abs 2.1 0.7 - 4.0 K/uL   Monocytes Relative 7 %   Monocytes Absolute 1.7 (H) 0.1 - 1.0 K/uL   Eosinophils Relative 0 %   Eosinophils Absolute 0.0 0.0 - 0.5 K/uL   Basophils Relative 0 %    Basophils Absolute 0.1 0.0 - 0.1 K/uL   Immature Granulocytes 3 %   Abs Immature Granulocytes 0.65 (H) 0.00 - 0.07 K/uL    Comment: Performed at [facility], [place] [address], [place], [place] [address]  Protime-INR     Status: None   Collection Time: 06/16/20  3:32 PM  Result Value Ref Range   Prothrombin Time 13.5 11.4 - 15.2 seconds   INR 1.1 0.8 - 1.2    Comment: (NOTE) INR goal varies based on device and disease states. Performed at [facility], [place] [address] [place], [place] [address]   APTT     Status: Abnormal   Collection Time: 06/16/20  3:32 PM  Result Value Ref Range   aPTT 39 (H) 24 - 36 seconds    Comment:        IF BASELINE aPTT IS ELEVATED, SUGGEST PATIENT RISK ASSESSMENT BE USED TO DETERMINE APPROPRIATE ANTICOAGULANT THERAPY. Performed at [facility], [place] [address] [place], [place] [address]   Blood Culture (routine x 2)     Status: None (Preliminary result)   Collection Time: 06/16/20  3:32 PM   Specimen: BLOOD  Result Value Ref Range   Specimen Description      BLOOD RIGHT ANTECUBITAL Performed at [facility], [place] [address], [place], [place] [address]    Special Requests      BOTTLES DRAWN AEROBIC AND ANAEROBIC Blood Culture adequate volume Performed at [place] [address] [place], [place] [address]    Culture      NO GROWTH 2 DAYS Performed at [facility], [place] [address] [place],  [address]    Report Status PENDING   POC occult blood, ED     Status: None   Collection Time: 06/16/20  3:36 PM  Result Value Ref Range   Fecal Occult Bld NEGATIVE NEGATIVE  I-Stat Chem 8, ED     Status: Abnormal   Collection Time: 06/16/20  3:53 PM  Result Value Ref Range   Sodium 137 135 - 145 mmol/L   Potassium 2.8 (L) 3.5 - 5.1 mmol/L   Chloride 99 98 - 111 mmol/L   BUN 35 (H) 6 - 20 mg/dL   Creatinine, Ser 0.90 0.44 - 1.00 mg/dL    Glucose, Bld 122 (H) 70 - 99 mg/dL    Comment: Glucose reference range applies only to samples taken after fasting for at least 8 hours.   Calcium, Ion 1.17 1.15 - 1.40 mmol/L   TCO2 28 22 - 32 mmol/L   Hemoglobin 11.6 (L) 12.0 - 15.0 g/dL   HCT 34.0 (L) 36.0 - 46.0 %  Blood Culture (routine x 2)     Status: None (Preliminary result)   Collection Time: 06/16/20  4:00 PM   Specimen: BLOOD RIGHT HAND  Result Value Ref Range   Specimen Description  BLOOD RIGHT HAND Performed at [facility], [place] [address], [place], [place] [address]    Special Requests      BOTTLES DRAWN AEROBIC AND ANAEROBIC Blood Culture results may not be optimal due to an inadequate volume of blood received in culture bottles Performed at [facility], [place] [address], [place], [place] [address]    Culture      NO GROWTH 2 DAYS Performed at [facility], [place] [address], [place], [place] [address]    Report Status PENDING   Magnesium     Status: Abnormal   Collection Time: 06/16/20  4:40 PM  Result Value Ref Range   Magnesium 1.6 (L) 1.7 - 2.4 mg/dL    Comment: Performed at [facility], [place] [address], [place], [place] [address]  Lactic acid, plasma     Status: Abnormal   Collection Time: 06/16/20  6:50 PM  Result Value Ref Range   Lactic Acid, Venous 4.1 (HH) 0.5 - 1.9 mmol/L    Comment: CRITICAL RESULT CALLED TO, READ BACK BY AND VERIFIED WITH: BREWER,B. RN @1939  ON 01.24.2022 BY COHEN,K Performed at [facility], [place] [address] [place], [place] [address]   Urinalysis, Routine w reflex microscopic     Status: Abnormal   Collection Time: 06/16/20  8:28 PM  Result Value Ref Range   Color, Urine YELLOW YELLOW   APPearance CLOUDY (A) CLEAR   Specific Gravity, Urine 1.025 1.005 - 1.030   pH 5.0 5.0 - 8.0   Glucose, UA NEGATIVE NEGATIVE mg/dL   Hgb urine dipstick MODERATE (A) NEGATIVE   Bilirubin Urine NEGATIVE NEGATIVE    Ketones, ur NEGATIVE NEGATIVE mg/dL   Protein, ur NEGATIVE NEGATIVE mg/dL   Nitrite NEGATIVE NEGATIVE   Leukocytes,Ua LARGE (A) NEGATIVE   RBC / HPF 11-20 0 - 5 RBC/hpf   WBC, UA >50 (H) 0 - 5 WBC/hpf   Bacteria, UA RARE (A) NONE SEEN   Squamous Epithelial / LPF 0-5 0 - 5   Mucus PRESENT     Comment: Performed at [facility], [place] [address], [place], [place] [address]  Urine culture     Status: Abnormal (Preliminary result)   Collection Time: 06/16/20  8:28 PM   Specimen: In/Out Cath Urine  Result Value Ref Range   Specimen Description      IN/OUT CATH URINE Performed at [facility], [place] [address], [place], [place] [address]    Special Requests      NONE Performed at [facility], [place] [address] [place], [place] [address]    Culture (A)     >=100,000 COLONIES/mL ESCHERICHIA COLI SUSCEPTIBILITIES TO FOLLOW Performed at [place] [address], [place],  [address]    Report Status PENDING   Procalcitonin     Status: None   Collection Time: 06/16/20  9:01 PM  Result Value Ref Range   Procalcitonin 1.98 ng/mL    Comment:        Interpretation: PCT > 0.5 ng/mL and <= 2 ng/mL: Systemic infection (sepsis) is possible, but other conditions are known to elevate PCT as well. (NOTE)       Sepsis PCT Algorithm           Lower Respiratory Tract  Infection PCT Algorithm    ----------------------------     ----------------------------         PCT < 0.25 ng/mL                PCT < 0.10 ng/mL          Strongly encourage             Strongly discourage   discontinuation of antibiotics    initiation of antibiotics    ----------------------------     -----------------------------       PCT 0.25 - 0.50 ng/mL            PCT 0.10 - 0.25 ng/mL               OR       >80% decrease in PCT            Discourage initiation of                                            antibiotics       Encourage discontinuation           of antibiotics    ----------------------------     -----------------------------         PCT >= 0.50 ng/mL              PCT 0.26 - 0.50 ng/mL                AND       <80% decrease in PCT             Encourage initiation of                                             antibiotics       Encourage continuation           of antibiotics    ----------------------------     -----------------------------        PCT >= 0.50 ng/mL                  PCT > 0.50 ng/mL               AND         increase in PCT                  Strongly encourage                                      initiation of antibiotics    Strongly encourage escalation           of antibiotics                                     -----------------------------                                           PCT <= 0.25 ng/mL  OR                                        > 80% decrease in PCT                                      Discontinue / Do not initiate                                             antibiotics  Performed at [place] [address] [place], [place] [address]   Troponin I (High Sensitivity)     Status: Abnormal   Collection Time: 06/16/20  9:22 PM  Result Value Ref Range   Troponin I (High Sensitivity) 112 (HH) <18 ng/L    Comment: CRITICAL RESULT CALLED TO, READ BACK BY AND VERIFIED WITH: REBECCA, RN @ 0129 ON 06/17/20 C VARNER (NOTE) Elevated high sensitivity troponin I (hsTnI) values and significant  changes across serial measurements may suggest ACS but many other  chronic and acute conditions are known to elevate hsTnI results.  Refer to the Links section for chest pain algorithms and additional  guidance. Performed at [facility], [place] [address] [place], [place] [address]   Lactic acid, plasma     Status: None   Collection Time: 06/16/20 11:00 PM  Result Value Ref Range    Lactic Acid, Venous 1.8 0.5 - 1.9 mmol/L    Comment: Performed at [facility], [place] [address] [place], [place] [address]  HIV Antibody (routine testing w rflx)     Status: None   Collection Time: 06/16/20 11:22 PM  Result Value Ref Range   HIV Screen 4th Generation wRfx Non Reactive Non Reactive    Comment: Performed at [facility], [place] [address] [place], [place] [address]  CBC     Status: Abnormal   Collection Time: 06/16/20 11:22 PM  Result Value Ref Range   WBC 19.6 (H) 4.0 - 10.5 K/uL   RBC 3.72 (L) 3.87 - 5.11 MIL/uL   Hemoglobin 9.7 (L) 12.0 - 15.0 g/dL   HCT 29.6 (L) 36.0 - 46.0 %   MCV 79.6 (L) 80.0 - 100.0 fL   MCH 26.1 26.0 - 34.0 pg   MCHC 32.8 30.0 - 36.0 g/dL   RDW 19.9 (H) 11.5 - 15.5 %   Platelets 445 (H) 150 - 400 K/uL   nRBC 0.2 0.0 - 0.2 %    Comment: Performed at [facility], [place] [address] [place], [place] [address]  Creatinine, serum     Status: None   Collection Time: 06/16/20 11:22 PM  Result Value Ref Range   Creatinine, Ser 0.56 0.44 - 1.00 mg/dL   GFR, Estimated >60 >60 mL/min    Comment: (NOTE) Calculated using the CKD-EPI Creatinine Equation (2021) Performed at [facility], [place] [address], [place], [place] [address]   Lactic acid, plasma     Status: Abnormal   Collection Time: 06/17/20  2:30 AM  Result Value Ref Range   Lactic Acid, Venous 2.9 (HH) 0.5 - 1.9 mmol/L    Comment: CRITICAL RESULT CALLED TO, READ BACK BY AND VERIFIED  WITH: CAMERON, RN @ 0314 ON 06/17/20 C VARNER Performed at [facility], [address] [place], [place] [address]   Troponin I (High Sensitivity)     Status: Abnormal   Collection Time: 06/17/20  2:30 AM  Result Value Ref Range   Troponin I (High Sensitivity) 100 (HH) <18 ng/L    Comment: CRITICAL VALUE NOTED.  VALUE IS CONSISTENT WITH PREVIOUSLY REPORTED AND CALLED VALUE. (NOTE) Elevated high sensitivity troponin I (hsTnI) values  and significant  changes across serial measurements may suggest ACS but many other  chronic and acute conditions are known to elevate hsTnI results.  Refer to the Links section for chest pain algorithms and additional  guidance. Performed at [facility], [address] [place], [place] [address]   CBC     Status: Abnormal   Collection Time: 06/17/20  4:36 AM  Result Value Ref Range   WBC 18.1 (H) 4.0 - 10.5 K/uL   RBC 3.31 (L) 3.87 - 5.11 MIL/uL   Hemoglobin 8.6 (L) 12.0 - 15.0 g/dL   HCT 26.3 (L) 36.0 - 46.0 %   MCV 79.5 (L) 80.0 - 100.0 fL   MCH 26.0 26.0 - 34.0 pg   MCHC 32.7 30.0 - 36.0 g/dL   RDW 19.3 (H) 11.5 - 15.5 %   Platelets 401 (H) 150 - 400 K/uL   nRBC 0.0 0.0 - 0.2 %    Comment: Performed at [facility], [address] [place], [place] [address]  Comprehensive metabolic panel     Status: Abnormal   Collection Time: 06/17/20  4:36 AM  Result Value Ref Range   Sodium 136 135 - 145 mmol/L   Potassium 2.8 (L) 3.5 - 5.1 mmol/L   Chloride 100 98 - 111 mmol/L   CO2 24 22 - 32 mmol/L   Glucose, Bld 103 (H) 70 - 99 mg/dL    Comment: Glucose reference range applies only to samples taken after fasting for at least 8 hours.   BUN 21 (H) 6 - 20 mg/dL   Creatinine, Ser 0.73 0.44 - 1.00 mg/dL   Calcium 8.5 (L) 8.9 - 10.3 mg/dL   Total Protein 5.1 (L) 6.5 - 8.1 g/dL   Albumin 1.9 (L) 3.5 - 5.0 g/dL   AST 18 15 - 41 U/L   ALT 16 0 - 44 U/L   Alkaline Phosphatase 94 38 - 126 U/L   Total Bilirubin 0.5 0.3 - 1.2 mg/dL   GFR, Estimated >60 >60 mL/min    Comment: (NOTE) Calculated using the CKD-EPI Creatinine Equation (2021)    Anion gap 12 5 - 15    Comment: Performed at [name], [address] [place], [place] [address]  Magnesium     Status: None   Collection Time: 06/17/20  4:36 AM  Result Value Ref Range   Magnesium 2.1 1.7 - 2.4 mg/dL    Comment: Performed at [facility], [address] [place], [place] [address]  CBC     Status: Abnormal   Collection Time: 06/18/20  7:20 AM  Result Value Ref Range   WBC 14.9 (H) 4.0 - 10.5 K/uL   RBC 3.31 (L) 3.87 - 5.11 MIL/uL   Hemoglobin 8.6 (L) 12.0 - 15.0 g/dL   HCT 26.7 (L) 36.0 - 46.0 %   MCV 80.7 80.0 - 100.0 fL   MCH 26.0 26.0 - 34.0 pg   MCHC 32.2 30.0 - 36.0 g/dL     RDW 19.9 (H) 11.5 - 15.5 %   Platelets 413 (H) 150 - 400 K/uL   nRBC 0.0 0.0 - 0.2 %    Comment: Performed at [facility], [place] [address] [place], [place] [address]  Comprehensive metabolic panel     Status: Abnormal   Collection Time: 06/18/20  7:20 AM  Result Value Ref Range   Sodium 137 135 - 145 mmol/L   Potassium 2.7 (LL) 3.5 - 5.1 mmol/L    Comment: CRITICAL RESULT CALLED TO, READ BACK BY AND VERIFIED WITH: KUSNITZ,S @ 0835 ON [id number] BY POTEAT,S    Chloride 100 98 - 111 mmol/L   CO2 25 22 - 32 mmol/L   Glucose, Bld 122 (H) 70 - 99 mg/dL    Comment: Glucose reference range applies only to samples taken after fasting for at least 8 hours.   BUN 10 6 - 20 mg/dL   Creatinine, Ser 0.72 0.44 - 1.00 mg/dL   Calcium 8.4 (L) 8.9 - 10.3 mg/dL   Total Protein 5.0 (L) 6.5 - 8.1 g/dL   Albumin 1.9 (L) 3.5 - 5.0 g/dL   AST 18 15 - 41 U/L   ALT 16 0 - 44 U/L   Alkaline Phosphatase 86 38 - 126 U/L   Total Bilirubin 0.5 0.3 - 1.2 mg/dL   GFR, Estimated >60 >60 mL/min    Comment: (NOTE) Calculated using the CKD-EPI Creatinine Equation (2021)    Anion gap 12 5 - 15    Comment: Performed at [facility], [place] [address] [place], [place] [address]  Magnesium     Status: Abnormal   Collection Time: 06/18/20  7:20 AM  Result Value Ref Range   Magnesium 1.4 (L) 1.7 - 2.4 mg/dL    Comment: Performed at [facility], [place] [address], [place], [place] [address]  Phosphorus     Status: Abnormal   Collection Time: 06/18/20  7:20 AM  Result Value Ref Range   Phosphorus 1.8 (L) 2.5 - 4.6 mg/dL     Comment: Performed at [facility], [place] [address] [place], [place] [address]  Lactic acid, plasma     Status: None   Collection Time: 06/18/20  7:20 AM  Result Value Ref Range   Lactic Acid, Venous 1.1 0.5 - 1.9 mmol/L    Comment: Performed at [facility], [place] [address], [place], [place] [address]  Troponin I (High Sensitivity)     Status: Abnormal   Collection Time: 06/18/20  7:20 AM  Result Value Ref Range   Troponin I (High Sensitivity) 58 (H) <18 ng/L    Comment: DELTA CHECK NOTED (NOTE) Elevated high sensitivity troponin I (hsTnI) values and significant  changes across serial measurements may suggest ACS but many other  chronic and acute conditions are known to elevate hsTnI results.  Refer to the Links section for chest pain algorithms and additional  guidance. Performed at [facility], [place] [address], [place], [name] [address]   Hemoglobin A1c     Status: Abnormal   Collection Time: 06/18/20  7:20 AM  Result Value Ref Range   Hgb A1c MFr Bld 6.5 (H) 4.8 - 5.6 %    Comment: (NOTE) Pre diabetes:          5.7%-6.4%  Diabetes:              >6.4%  Glycemic control for   <7.0% adults with diabetes    Mean Plasma  Glucose 139.85 mg/dL    Comment: Performed at [facility], [place] [address] [place], [place] [address]   CT Abdomen Pelvis W Contrast  Result Date: 06/16/2020 CLINICAL DATA:  Sepsis, sacral wound. EXAM: CT ABDOMEN AND PELVIS WITH CONTRAST TECHNIQUE: Multidetector CT imaging of the abdomen and pelvis was performed using the standard protocol following bolus administration of intravenous contrast. CONTRAST:  157mL OMNIPAQUE IOHEXOL 300 MG/ML  SOLN COMPARISON:  None. FINDINGS: Lower chest: No acute abnormality. Hepatobiliary: No focal hepatic abnormality. Gallbladder unremarkable. Pancreas: No focal abnormality or ductal dilatation. Spleen: No focal abnormality.  Normal size. Adrenals/Urinary Tract: No  adrenal abnormality. No focal renal abnormality. No stones or hydronephrosis. Urinary bladder is unremarkable. Stomach/Bowel: Large stool burden in the rectosigmoid colon. No evidence of bowel obstruction. Vascular/Lymphatic: Aortic atherosclerosis. No evidence of aneurysm or adenopathy. Reproductive: Fibroid uterus, unchanged.  No adnexal mass. Other: No free fluid or free air. Musculoskeletal: Large open sacral decubitus ulcer/wound noted. This extends to the coccyx without definite acute osteomyelitis. IMPRESSION: Large sacral wound extends down to the coccyx without definitive change of acute osteomyelitis. Large stool burden in the rectosigmoid colon concerning for fecal impaction. Fibroid uterus. Aortic atherosclerosis. Electronically Signed   By: Rolm Baptise M.D.   On: 06/16/2020 19:23   DG Chest Port 1 View  Result Date: 06/16/2020 CLINICAL DATA:  Possible sepsis. EXAM: PORTABLE CHEST 1 VIEW COMPARISON:  05/13/2020 FINDINGS: 1530 hours. The lungs are clear without focal pneumonia, edema, pneumothorax or pleural effusion. Linear atelectasis or scarring noted right base. Interstitial markings are diffusely coarsened with chronic features. The cardiopericardial silhouette is within normal limits for size. The visualized bony structures of the thorax show no acute abnormality. IMPRESSION: No active disease. Electronically Signed   By: Misty Stanley M.D.   On: 06/16/2020 15:52   . dextrose 5% lactated ringers 100 mL/hr at 06/18/20 1136  . magnesium sulfate bolus IVPB 4 g (06/18/20 1230)  . piperacillin-tazobactam (ZOSYN)  IV 3.375 g (06/18/20 1036)  . potassium PHOSPHATE IVPB (in mmol)    . vancomycin Stopped (06/18/20 0039)     Assessment/Plan Multiple sclerosis, left hemiplegia -limited mobility COVID 19 virus infection with acute hypoxic respiratory failure 12/21-04/2620 Tobacco use Hx constipation Hypertension Hx colostomy/colostomy reversal On multiple medications Hypothyroid Anemia   Hx AKI Probable malnutrition   Stage IV right buttocks decubitus ulcer/osteomyelitis Sepsis with decubitus ulcer and UTI UTI - > 100 K E. coli  FEN: IV fluids/heart healthy/Cardama diet ID: Zosyn 1/24 >> day 3 DVT: Heparin Follow-up: [facility] wound clinic  Plan: Continue current wet-to-dry treatments today. Continue antibiotics. I will get a prealbumin with a.m. labs tomorrow. We will plan to take her to the OR for debridement of the sacral decubitus tomorrow. Once it is cleaned up in the OR we go back to wet-to-dry dressings. She will need to continue follow-up with the wound center clinic at [facility] as before. We can clean this up. She is bedbound currently and this probably will not heal. I would work on her nutrition and also get infectious disease to look at her and discuss treatment for probable osteomyelitis. Of also ordered a stool occult to be checked, will defer to medicine for follow-up on this. Discussed surgery and care with her daughter [name], [phone].  Earnstine Regal [facility] 06/18/2020, 12:02 PM Please see Amion for pager number during day hours 7:00am-4:30pm

## 2020-06-18 NOTE — Progress Notes (Signed)
  Echocardiogram 2D Echocardiogram has been performed.  Tina Griffith 06/18/2020, 2:25 PM

## 2020-06-18 NOTE — Progress Notes (Signed)
Initial Nutrition Assessment  DOCUMENTATION CODES:   Non-severe (moderate) malnutrition in context of chronic illness  INTERVENTION:   Monitor magnesium, potassium, and phosphorus daily for at least 3 days, MD to replete as needed, as pt is at risk for refeeding syndrome.  -Prosource Plus PO BID, each provides 100 kcals and 15g protein  -Magic cup BID with meals, each supplement provides 290 kcal and 9 grams of protein  -Daily snacks at 2pm and HS  -Multivitamin with minerals daily  NUTRITION DIAGNOSIS:   Moderate Malnutrition related to chronic illness,wound healing (MS) as evidenced by energy intake < or equal to 75% for > or equal to 1 month,moderate fat depletion,mild muscle depletion.  GOAL:   Patient will meet greater than or equal to 90% of their needs  MONITOR:   PO intake,Supplement acceptance,Labs,Weight trends,I & O's  REASON FOR ASSESSMENT:   Malnutrition Screening Tool    ASSESSMENT:   60 y.o. African-American female presented to the [facility] Emergency Department via EMS with complaints of failure to thrive and worsening of gluteal/sacral pressure sores.She has a history of multiple sclerosis, which has her confined to a wheelchair for mobility.  Patient reports eating "absolutely nothing" for a week PTA. Prior to that week pt was eating but maybe not as much as before her previous admission (she was admitted for COVID-19). Pt did eat some oatmeal and yogurt this morning. States she ate meatloaf last night. Pt states she tried to drink Ensure supplements but states they gave her severe diarrhea. She declines trying them again. Was agreeable to trying small volume Prosource and daily snacks of 1/2 sandwiches with fruit. Denies any issues swallowing or chewing.  Per surgery note, pt will have debridement 1/27.   Unable to state UBW.  Admission weight: 165 lbs. Pt with left sided hemiplegia.   Medications: Ferrous sulfate, Multivitamin  with minerals daily, K-Phos, KLOR-CON, D5 infusion, IV Mg sulfate  Labs reviewed: CBGs: 148 Low K, Mg, Phos  NUTRITION - FOCUSED PHYSICAL EXAM:  Flowsheet Row Most Recent Value  Orbital Region Mild depletion  Upper Arm Region Moderate depletion  Thoracic and Lumbar Region Unable to assess  Buccal Region Moderate depletion  Temple Region Mild depletion  Clavicle Bone Region Mild depletion  Clavicle and Acromion Bone Region Mild depletion  Scapular Bone Region Mild depletion  Dorsal Hand Mild depletion  Patellar Region Unable to assess  Anterior Thigh Region Unable to assess  Posterior Calf Region Unable to assess  Edema (RD Assessment) None  Hair Reviewed  Mouth Reviewed  [missing teeth]  Skin Reviewed       Diet Order:   Diet Order            Diet heart healthy/carb modified Room service appropriate? Yes; Fluid consistency: Thin  Diet effective now                 EDUCATION NEEDS:   Education needs have been addressed  Skin:  Skin Assessment: Skin Integrity Issues: Skin Integrity Issues:: Stage IV Stage IV: right buttocks  Last BM:  1/25  Height:   Ht Readings from Last 1 Encounters:  06/17/20 5\' 5"  (1.651 m)    Weight:   Wt Readings from Last 1 Encounters:  06/17/20 74.8 kg    BMI:  Body mass index is 27.46 kg/m.  Estimated Nutritional Needs:   Kcal:  2200-2400  Protein:  110-125g  Fluid:  2.2L/day  Clayton Bibles, MS, RD, LDN Inpatient Clinical Dietitian Contact information  available via Amion

## 2020-06-19 ENCOUNTER — Encounter (HOSPITAL_COMMUNITY)
Admission: EM | Disposition: A | Discharge status: Skilled Nursing Facility | Payer: Medicare Other | Source: Home / Self Care | Attending: Internal Medicine

## 2020-06-19 ENCOUNTER — Inpatient Hospital Stay (HOSPITAL_COMMUNITY): Payer: Medicare Other | Admitting: Anesthesiology

## 2020-06-19 ENCOUNTER — Encounter (HOSPITAL_COMMUNITY): Payer: Self-pay | Admitting: Internal Medicine

## 2020-06-19 DIAGNOSIS — E440 Moderate protein-calorie malnutrition: Secondary | ICD-10-CM | POA: Insufficient documentation

## 2020-06-19 DIAGNOSIS — A419 Sepsis, unspecified organism: Secondary | ICD-10-CM | POA: Diagnosis not present

## 2020-06-19 DIAGNOSIS — R6521 Severe sepsis with septic shock: Secondary | ICD-10-CM | POA: Diagnosis not present

## 2020-06-19 HISTORY — PX: WOUND DEBRIDEMENT: SHX247

## 2020-06-19 LAB — CBC WITH DIFFERENTIAL/PLATELET
Abs Immature Granulocytes: 0.5 10*3/uL — ABNORMAL HIGH (ref 0.00–0.07)
Basophils Absolute: 0.1 10*3/uL (ref 0.0–0.1)
Basophils Relative: 0 %
Eosinophils Absolute: 0.1 10*3/uL (ref 0.0–0.5)
Eosinophils Relative: 1 %
HCT: 25.7 % — ABNORMAL LOW (ref 36.0–46.0)
Hemoglobin: 8.4 g/dL — ABNORMAL LOW (ref 12.0–15.0)
Immature Granulocytes: 3 %
Lymphocytes Relative: 11 %
Lymphs Abs: 2.1 10*3/uL (ref 0.7–4.0)
MCH: 26.1 pg (ref 26.0–34.0)
MCHC: 32.7 g/dL (ref 30.0–36.0)
MCV: 79.8 fL — ABNORMAL LOW (ref 80.0–100.0)
Monocytes Absolute: 1.1 10*3/uL — ABNORMAL HIGH (ref 0.1–1.0)
Monocytes Relative: 6 %
Neutro Abs: 14.3 10*3/uL — ABNORMAL HIGH (ref 1.7–7.7)
Neutrophils Relative %: 79 %
Platelets: 408 10*3/uL — ABNORMAL HIGH (ref 150–400)
RBC: 3.22 MIL/uL — ABNORMAL LOW (ref 3.87–5.11)
RDW: 19.9 % — ABNORMAL HIGH (ref 11.5–15.5)
WBC: 18.1 10*3/uL — ABNORMAL HIGH (ref 4.0–10.5)
nRBC: 0 % (ref 0.0–0.2)

## 2020-06-19 LAB — GLUCOSE, CAPILLARY
Glucose-Capillary: 115 mg/dL — ABNORMAL HIGH (ref 70–99)
Glucose-Capillary: 80 mg/dL (ref 70–99)

## 2020-06-19 LAB — RETICULOCYTES
Immature Retic Fract: 34 % — ABNORMAL HIGH (ref 2.3–15.9)
RBC.: 3.24 MIL/uL — ABNORMAL LOW (ref 3.87–5.11)
Retic Count, Absolute: 57.3 10*3/uL (ref 19.0–186.0)
Retic Ct Pct: 1.8 % (ref 0.4–3.1)

## 2020-06-19 LAB — IRON AND TIBC
Iron: 20 ug/dL — ABNORMAL LOW (ref 28–170)
Saturation Ratios: 14 % (ref 10.4–31.8)
TIBC: 141 ug/dL — ABNORMAL LOW (ref 250–450)
UIBC: 121 ug/dL

## 2020-06-19 LAB — FERRITIN: Ferritin: 349 ng/mL — ABNORMAL HIGH (ref 11–307)

## 2020-06-19 LAB — URINE CULTURE: Culture: >=100000 — AB

## 2020-06-19 LAB — BASIC METABOLIC PANEL
Anion gap: 11 (ref 5–15)
BUN: 9 mg/dL (ref 6–20)
CO2: 24 mmol/L (ref 22–32)
Calcium: 8.4 mg/dL — ABNORMAL LOW (ref 8.9–10.3)
Chloride: 101 mmol/L (ref 98–111)
Creatinine, Ser: 0.57 mg/dL (ref 0.44–1.00)
GFR, Estimated: >60 mL/min (ref >60)
Glucose, Bld: 120 mg/dL — ABNORMAL HIGH (ref 70–99)
Potassium: 3.7 mmol/L (ref 3.5–5.1)
Sodium: 136 mmol/L (ref 135–145)

## 2020-06-19 LAB — MAGNESIUM: Magnesium: 1.6 mg/dL — ABNORMAL LOW (ref 1.7–2.4)

## 2020-06-19 LAB — VITAMIN B12: Vitamin B-12: 317 pg/mL (ref 180–914)

## 2020-06-19 LAB — PHOSPHORUS: Phosphorus: 2.5 mg/dL (ref 2.5–4.6)

## 2020-06-19 LAB — FOLATE: Folate: 17.2 ng/mL (ref >5.9)

## 2020-06-19 LAB — OCCULT BLOOD X 1 CARD TO LAB, STOOL: Fecal Occult Bld: NEGATIVE

## 2020-06-19 LAB — SURGICAL PCR SCREEN
MRSA, PCR: NEGATIVE
Staphylococcus aureus: POSITIVE — AB

## 2020-06-19 SURGERY — DEBRIDEMENT, WOUND
Anesthesia: General | Site: Back | Laterality: Right

## 2020-06-19 MED ORDER — DOXYCYCLINE HYCLATE 100 MG PO TABS
100.0000 mg | ORAL_TABLET | ORAL | Status: DC
  Administered 2020-06-19 – 2020-06-20 (×2): 100 mg via ORAL
  Filled 2020-06-19 (×2): qty 1

## 2020-06-19 MED ORDER — ENOXAPARIN SODIUM 40 MG/0.4ML ~~LOC~~ SOLN
40.0000 mg | SUBCUTANEOUS | Status: DC
  Administered 2020-06-20 – 2020-07-07 (×18): 40 mg via SUBCUTANEOUS
  Filled 2020-06-19 (×18): qty 0.4

## 2020-06-19 MED ORDER — LIDOCAINE HCL (PF) 2 % IJ SOLN
INTRAMUSCULAR | Status: AC
  Filled 2020-06-19: qty 5

## 2020-06-19 MED ORDER — HYDROMORPHONE HCL 1 MG/ML IJ SOLN
0.2500 mg | INTRAMUSCULAR | Status: DC | PRN
  Administered 2020-06-19: 0.5 mg via INTRAVENOUS

## 2020-06-19 MED ORDER — MUPIROCIN 2 % EX OINT
1.0000 "application " | TOPICAL_OINTMENT | Freq: Two times a day (BID) | CUTANEOUS | Status: AC
  Administered 2020-06-19 – 2020-06-24 (×10): 1 via NASAL
  Filled 2020-06-19 (×2): qty 22

## 2020-06-19 MED ORDER — PROPOFOL 10 MG/ML IV BOLUS
INTRAVENOUS | Status: DC | PRN
  Administered 2020-06-19: 100 mg via INTRAVENOUS

## 2020-06-19 MED ORDER — ALBUMIN HUMAN 5 % IV SOLN
INTRAVENOUS | Status: AC
  Filled 2020-06-19: qty 250

## 2020-06-19 MED ORDER — PANTOPRAZOLE SODIUM 40 MG PO TBEC
40.0000 mg | DELAYED_RELEASE_TABLET | Freq: Every day | ORAL | Status: DC
  Administered 2020-06-19 – 2020-07-07 (×19): 40 mg via ORAL
  Filled 2020-06-19 (×19): qty 1

## 2020-06-19 MED ORDER — DEXAMETHASONE SODIUM PHOSPHATE 10 MG/ML IJ SOLN
INTRAMUSCULAR | Status: AC
  Filled 2020-06-19: qty 1

## 2020-06-19 MED ORDER — OXYCODONE HCL 5 MG PO TABS: 5.0000 mg | ORAL_TABLET | Freq: Once | ORAL | Status: DC | PRN

## 2020-06-19 MED ORDER — LIRAGLUTIDE 18 MG/3ML ~~LOC~~ SOPN: 0.6000 mg | PEN_INJECTOR | Freq: Every day | SUBCUTANEOUS | Status: DC

## 2020-06-19 MED ORDER — ONDANSETRON HCL 4 MG/2ML IJ SOLN
INTRAMUSCULAR | Status: DC | PRN
  Administered 2020-06-19: 4 mg via INTRAVENOUS

## 2020-06-19 MED ORDER — HYDROMORPHONE HCL 1 MG/ML IJ SOLN
INTRAMUSCULAR | Status: AC
  Administered 2020-06-19: 0.5 mg via INTRAVENOUS
  Filled 2020-06-19: qty 1

## 2020-06-19 MED ORDER — OCRELIZUMAB 300 MG/10ML IV SOLN: 600.0000 mg | INTRAVENOUS | Status: DC

## 2020-06-19 MED ORDER — PANTOPRAZOLE SODIUM 40 MG IV SOLR: 40.0000 mg | Freq: Two times a day (BID) | INTRAVENOUS | Status: DC

## 2020-06-19 MED ORDER — ZINC GLUCONATE 50 MG PO TABS: 50.0000 mg | ORAL_TABLET | Freq: Every day | ORAL | Status: DC

## 2020-06-19 MED ORDER — ADULT MULTIVITAMIN W/MINERALS CH
1.0000 | ORAL_TABLET | Freq: Every day | ORAL | Status: DC
  Administered 2020-06-20 – 2020-07-07 (×18): 1 via ORAL
  Filled 2020-06-19 (×18): qty 1

## 2020-06-19 MED ORDER — METOPROLOL SUCCINATE ER 50 MG PO TB24
100.0000 mg | ORAL_TABLET | Freq: Every day | ORAL | Status: DC
  Administered 2020-06-19 – 2020-07-07 (×19): 100 mg via ORAL
  Filled 2020-06-19 (×19): qty 2

## 2020-06-19 MED ORDER — KETAMINE HCL 10 MG/ML IJ SOLN
INTRAMUSCULAR | Status: AC
  Filled 2020-06-19: qty 1

## 2020-06-19 MED ORDER — ASPIRIN EC 81 MG PO TBEC
81.0000 mg | DELAYED_RELEASE_TABLET | Freq: Every day | ORAL | Status: DC
  Administered 2020-06-20 – 2020-07-07 (×18): 81 mg via ORAL
  Filled 2020-06-19 (×18): qty 1

## 2020-06-19 MED ORDER — FENTANYL CITRATE (PF) 100 MCG/2ML IJ SOLN
INTRAMUSCULAR | Status: AC
  Filled 2020-06-19: qty 2

## 2020-06-19 MED ORDER — AIRBORNE ELDERBERRY 100-50 MG PO CHEW: 2.0000 | CHEWABLE_TABLET | Freq: Every day | ORAL | Status: DC

## 2020-06-19 MED ORDER — CHLORHEXIDINE GLUCONATE CLOTH 2 % EX PADS
6.0000 | MEDICATED_PAD | Freq: Every day | CUTANEOUS | Status: AC
  Administered 2020-06-22 – 2020-06-24 (×3): 6 via TOPICAL

## 2020-06-19 MED ORDER — HYDROMORPHONE HCL 1 MG/ML IJ SOLN
INTRAMUSCULAR | Status: AC
  Filled 2020-06-19: qty 1

## 2020-06-19 MED ORDER — MAGNESIUM SULFATE 2 GM/50ML IV SOLN
2.0000 g | Freq: Once | INTRAVENOUS | Status: AC
  Administered 2020-06-19: 2 g via INTRAVENOUS
  Filled 2020-06-19: qty 50

## 2020-06-19 MED ORDER — TRAMADOL HCL 50 MG PO TABS
50.0000 mg | ORAL_TABLET | Freq: Four times a day (QID) | ORAL | Status: DC | PRN
  Administered 2020-06-21: 12:00:00 50 mg via ORAL
  Filled 2020-06-19: qty 1

## 2020-06-19 MED ORDER — ACETAMINOPHEN 650 MG RE SUPP: 650.0000 mg | Freq: Four times a day (QID) | RECTAL | Status: DC | PRN

## 2020-06-19 MED ORDER — LIDOCAINE 2% (20 MG/ML) 5 ML SYRINGE
INTRAMUSCULAR | Status: DC | PRN
  Administered 2020-06-19: 60 mg via INTRAVENOUS

## 2020-06-19 MED ORDER — SODIUM CHLORIDE 0.45 % IV SOLN: INTRAVENOUS | Status: DC

## 2020-06-19 MED ORDER — ONDANSETRON 4 MG PO TBDP: 4.0000 mg | ORAL_TABLET | Freq: Four times a day (QID) | ORAL | Status: DC | PRN

## 2020-06-19 MED ORDER — ACETAMINOPHEN 325 MG PO TABS
650.0000 mg | ORAL_TABLET | Freq: Four times a day (QID) | ORAL | Status: DC | PRN
  Administered 2020-06-19 – 2020-07-03 (×7): 650 mg via ORAL
  Filled 2020-06-19 (×7): qty 2

## 2020-06-19 MED ORDER — ONDANSETRON HCL 4 MG/2ML IJ SOLN: 4.0000 mg | Freq: Once | INTRAMUSCULAR | Status: DC | PRN

## 2020-06-19 MED ORDER — KETAMINE HCL 10 MG/ML IJ SOLN
INTRAMUSCULAR | Status: DC | PRN
  Administered 2020-06-19: 15 mg via INTRAVENOUS
  Administered 2020-06-19: 10 mg via INTRAVENOUS

## 2020-06-19 MED ORDER — 0.9 % SODIUM CHLORIDE (POUR BTL) OPTIME
TOPICAL | Status: DC | PRN
  Administered 2020-06-19: 1000 mL

## 2020-06-19 MED ORDER — PROPOFOL 10 MG/ML IV BOLUS
INTRAVENOUS | Status: AC
  Filled 2020-06-19: qty 20

## 2020-06-19 MED ORDER — LACTATED RINGERS IV SOLN
INTRAVENOUS | Status: DC | PRN
Start: 2020-06-19 — End: 2020-06-19

## 2020-06-19 MED ORDER — SUCCINYLCHOLINE CHLORIDE 200 MG/10ML IV SOSY
PREFILLED_SYRINGE | INTRAVENOUS | Status: AC
  Filled 2020-06-19: qty 10

## 2020-06-19 MED ORDER — OXYCODONE HCL 5 MG PO TABS
5.0000 mg | ORAL_TABLET | ORAL | Status: DC | PRN
  Administered 2020-06-21 – 2020-06-27 (×20): 10 mg via ORAL
  Administered 2020-06-27: 5 mg via ORAL
  Administered 2020-06-28 – 2020-06-30 (×7): 10 mg via ORAL
  Administered 2020-07-01: 01:00:00 5 mg via ORAL
  Administered 2020-07-01 – 2020-07-07 (×14): 10 mg via ORAL
  Filled 2020-06-19 (×26): qty 2
  Filled 2020-06-19: qty 1
  Filled 2020-06-19 (×17): qty 2

## 2020-06-19 MED ORDER — ROCURONIUM BROMIDE 10 MG/ML (PF) SYRINGE
PREFILLED_SYRINGE | INTRAVENOUS | Status: AC
  Filled 2020-06-19: qty 10

## 2020-06-19 MED ORDER — HYDROMORPHONE HCL 1 MG/ML IJ SOLN
1.0000 mg | INTRAMUSCULAR | Status: DC | PRN
  Administered 2020-06-19 – 2020-06-21 (×4): 1 mg via INTRAVENOUS
  Filled 2020-06-19 (×4): qty 1

## 2020-06-19 MED ORDER — PANTOPRAZOLE SODIUM 40 MG PO TBEC: 40.0000 mg | DELAYED_RELEASE_TABLET | Freq: Every day | ORAL | Status: DC

## 2020-06-19 MED ORDER — PHENYLEPHRINE 40 MCG/ML (10ML) SYRINGE FOR IV PUSH (FOR BLOOD PRESSURE SUPPORT)
PREFILLED_SYRINGE | INTRAVENOUS | Status: DC | PRN
  Administered 2020-06-19 (×3): 80 ug via INTRAVENOUS

## 2020-06-19 MED ORDER — OXYCODONE HCL 5 MG/5ML PO SOLN: 5.0000 mg | Freq: Once | ORAL | Status: DC | PRN

## 2020-06-19 MED ORDER — FENTANYL CITRATE (PF) 100 MCG/2ML IJ SOLN
INTRAMUSCULAR | Status: DC | PRN
  Administered 2020-06-19 (×4): 50 ug via INTRAVENOUS

## 2020-06-19 MED ORDER — OXYCODONE-ACETAMINOPHEN 10-325 MG PO TABS: 1.0000 | ORAL_TABLET | Freq: Four times a day (QID) | ORAL | Status: DC

## 2020-06-19 MED ORDER — ACETAMINOPHEN 10 MG/ML IV SOLN: 1000.0000 mg | Freq: Once | INTRAVENOUS | Status: DC | PRN

## 2020-06-19 MED ORDER — ONDANSETRON HCL 4 MG/2ML IJ SOLN: 4.0000 mg | Freq: Four times a day (QID) | INTRAMUSCULAR | Status: DC | PRN

## 2020-06-19 MED ORDER — ONDANSETRON HCL 4 MG/2ML IJ SOLN
INTRAMUSCULAR | Status: AC
  Filled 2020-06-19: qty 2

## 2020-06-19 MED ORDER — HYDROCHLOROTHIAZIDE 25 MG PO TABS
25.0000 mg | ORAL_TABLET | Freq: Every day | ORAL | Status: DC
  Administered 2020-06-20 – 2020-06-21 (×2): 25 mg via ORAL
  Filled 2020-06-19 (×2): qty 1

## 2020-06-19 SURGICAL SUPPLY — 35 items
BLADE HEX COATED 2.75 (ELECTRODE) ×3 IMPLANT
BLADE SURG SZ10 CARB STEEL (BLADE) ×3 IMPLANT
BNDG GAUZE ELAST 4 BULKY (GAUZE/BANDAGES/DRESSINGS) ×4 IMPLANT
CHLORAPREP W/TINT 26 (MISCELLANEOUS) ×1 IMPLANT
COVER WAND RF STERILE (DRAPES) IMPLANT
DECANTER SPIKE VIAL GLASS SM (MISCELLANEOUS) IMPLANT
DRAIN CHANNEL RND F F (WOUND CARE) IMPLANT
DRAPE LAPAROTOMY T 102X78X121 (DRAPES) IMPLANT
DRAPE LAPAROTOMY TRNSV 102X78 (DRAPES) ×2 IMPLANT
DRAPE SHEET LG 3/4 BI-LAMINATE (DRAPES) IMPLANT
DRSG PAD ABDOMINAL 8X10 ST (GAUZE/BANDAGES/DRESSINGS) ×2 IMPLANT
ELECT REM PT RETURN 15FT ADLT (MISCELLANEOUS) ×3 IMPLANT
EVACUATOR SILICONE 100CC (DRAIN) IMPLANT
GAUZE SPONGE 4X4 12PLY STRL (GAUZE/BANDAGES/DRESSINGS) ×3 IMPLANT
GLOVE SURG ORTHO LTX SZ8 (GLOVE) ×3 IMPLANT
GLOVE SURG UNDER POLY LF SZ7 (GLOVE) ×1 IMPLANT
GOWN STRL REUS W/TWL LRG LVL3 (GOWN DISPOSABLE) ×1 IMPLANT
GOWN STRL REUS W/TWL XL LVL3 (GOWN DISPOSABLE) ×6 IMPLANT
KIT BASIN OR (CUSTOM PROCEDURE TRAY) ×3 IMPLANT
KIT TURNOVER KIT A (KITS) ×2 IMPLANT
MARKER SKIN DUAL TIP RULER LAB (MISCELLANEOUS) IMPLANT
NDL HYPO 25X1 1.5 SAFETY (NEEDLE) ×1 IMPLANT
NEEDLE HYPO 25X1 1.5 SAFETY (NEEDLE) IMPLANT
NS IRRIG 1000ML POUR BTL (IV SOLUTION) ×3 IMPLANT
PACK GENERAL/GYN (CUSTOM PROCEDURE TRAY) ×3 IMPLANT
PENCIL SMOKE EVACUATOR (MISCELLANEOUS) IMPLANT
SPONGE LAP 18X18 RF (DISPOSABLE) IMPLANT
STAPLER VISISTAT 35W (STAPLE) IMPLANT
STRIP CLOSURE SKIN 1/2X4 (GAUZE/BANDAGES/DRESSINGS) IMPLANT
SUT ETHILON 3 0 PS 1 (SUTURE) IMPLANT
SUT MNCRL AB 4-0 PS2 18 (SUTURE) IMPLANT
SUT VIC AB 3-0 SH 18 (SUTURE) IMPLANT
SYR CONTROL 10ML LL (SYRINGE) ×3 IMPLANT
TOWEL OR 17X26 10 PK STRL BLUE (TOWEL DISPOSABLE) ×3 IMPLANT
TOWEL OR NON WOVEN STRL DISP B (DISPOSABLE) ×3 IMPLANT

## 2020-06-19 NOTE — Progress Notes (Signed)
PT Cancellation Note  Patient Details Name: Tina Griffith MRN: [id number] DOB: 22-Jan-1961   Cancelled Treatment:    Reason Eval/Treat Not Completed: Patient at procedure or test/unavailable. PT order received and pt is in the OR for sacral wound. PT to follow-up when pt is medically stable.   Lelon Mast 06/19/2020, 8:35 AM

## 2020-06-19 NOTE — Op Note (Signed)
Operative Note  Pre-operative Diagnosis: Stage IV sacral decubitus ulcer with necrosis  Post-operative Diagnosis: Same  Surgeon:  Armandina Gemma, MD  Assistant: None  Procedure: Hervey Ard debridement of stage IV sacral decubitus ulcer with removal of necrotic skin, adipose tissue, fascia, and muscle.  Approximate amount of tissue debrided is 15 cm x 10 cm x 5 cm.  Anesthesia: General anesthesia  Estimated Blood Loss: Less than 50 cc.  Drains: None.  Wound is packed open with saline moistened Kerlix gauze.         Specimen: None  Indications: Patient is a 60 year old female with a large stage IV sacral decubitus ulcer with necrosis.  She now comes to surgery for operative debridement.  Procedure:  The patient was seen in the pre-op holding area. The risks, benefits, complications, treatment options, and expected outcomes were previously discussed with the patient. The patient agreed with the proposed plan and has signed the informed consent form.  The patient was brought to the operating room by the surgical team, identified as Tina Griffith and the procedure verified. A "time out" was completed and the above information confirmed.  Patient was placed in a left lateral decubitus position.  After ascertaining that an adequate level of general anesthesia had been achieved, the patient is prepped and draped in the usual aseptic fashion.  There is necrosis of skin and subcutaneous tissues mainly to the right of midline and extending cephalad along the right gluteus musculature.  In the midline there is necrotic fascia overlying the underlying bone of the posterior sacrum.  There is some necrosis of the skin and underlying subcutaneous tissues extending to the left of midline.  Using combination of sharp dissection and the electrocautery, the necrotic skin, adipose tissue, fascia, and gluteus musculature is debrided.  Hemostasis is achieved with the electrocautery.  The wound is debrided back to  tissue that appears viable with blood supply.  Muscle shows contractile properties with use of the electrocautery.  Tissue is excised and discarded.  Good hemostasis is achieved throughout the wound with the electrocautery.  Open wound is packed with saline moistened Kerlix gauze.  Two 4 inch rolls are utilized to pack the entire wound.  This is covered with 4 x 4 gauze and ABD pads and secured with tape.  Patient is awakened from anesthesia and transported to the recovery room.  The patient tolerated the procedure well.  I contacted Tina Griffith daughter by telephone to discuss the operative procedure and findings.   Armandina Gemma, MD [facility] Office: [phone]

## 2020-06-19 NOTE — Transfer of Care (Signed)
Immediate Anesthesia Transfer of Care Note  Patient: Tina Griffith  Procedure(s) Performed: DEBRIDEMENT OF SACRAL WOUND (Right Back)  Patient Location: PACU  Anesthesia Type:General  Level of Consciousness: sedated  Airway & Oxygen Therapy: Patient Spontanous Breathing and Patient connected to face mask oxygen  Post-op Assessment: Report given to RN and Post -op Vital signs reviewed and stable  Post vital signs: Reviewed and stable  Last Vitals:  Vitals Value Taken Time  BP    Temp    Pulse 98 06/19/20 1137  Resp 20 06/19/20 1137  SpO2 99 % 06/19/20 1137  Vitals shown include unvalidated device data.  Last Pain:  Vitals:   06/19/20 0844  TempSrc: Oral  PainSc: 10-Worst pain ever         Complications: No complications documented.

## 2020-06-19 NOTE — Progress Notes (Signed)
PROGRESS NOTE  Tina Griffith  DOB: 26-Aug-1960  PCP: [place] CZY:[id number]  DOA: 06/16/2020  LOS: 3 days   Chief Complaint  Patient presents with  . Wound Infection  . Fatigue   Brief narrative: Tina Griffith is a 60 y.o. female with PMH significant for multiple sclerosis, movement disorder, hypertension, constipation and COVID-19 pneumonia in December 2021.  At baseline, she was using a walker and wheelchair up until [place] admission.  Since then she has remained bedbound at home and has developed sacral decubitus ulcers. 1/24, patient was brought to the ED with complaint of failure to thrive, worsening of gluteal/sacral pressure sores.  Since last hospitalization 4 weeks ago for Covid pneumonia, patient has had anorexia, weight loss, generalized fatigue and worsening lower extremity edema.  In the ED, patient was tachycardic to 110s. Labs showed potassium low at 2.8, WBC count elevated to 23.5.  Lactic acid was initially normal but later spiked to 2.9. Chest x-ray normal. Urinalysis with cloudy urine, large leukocytes CT abdomen/pelvis showed sacral decubitus with involvement down to coccyx but without overt signs of acute osteomyelitis. There was a concern of septic shock and hence patient was admitted to critical care service.  In chart review, however patient was never had a drop in blood pressure and never required pressors. Patient was transferred to hospitalist service.  Subjective: Patient was seen and examined this afternoon.  Sleeping, under the effect of anesthesia.  Tries to open eyes on touch, falls right back asleep. She underwent debridement of her sacral decubitus ulcer by general surgeon Dr. Harlow Asa this morning.  I discussed the case with Dr. Harlow Asa this afternoon. Urine culture grew more than 100,000 CFU per mL of E. Coli.  Assessment/Plan: Sepsis - POA, no evidence of septic shock -Likely source sacral decubitus ulcer.  Also concern of UTI. -Met  criteria with leukocytosis, tachycardia elevated lactic acid and source of infection. -Blood culture and urine cultures sent.   -Currently on IV Zosyn and oral doxycycline. -WBC count and lactic acid level improving. -Continue IV hydration with D5 LR at 100 mill per hour. Recent Labs  Lab 06/16/20 1532 06/16/20 1850 06/16/20 2101 06/16/20 2300 06/16/20 2322 06/17/20 0230 06/17/20 0436 06/18/20 0720 06/19/20 0823  WBC 23.5*  --   --   --  19.6*  --  18.1* 14.9* 18.1*  LATICACIDVEN 1.6 4.1*  --  1.8  --  2.9*  --  1.1  --   PROCALCITON  --   --  1.98  --   --   --   --   --   --    Stage IV sacral decubitus ulcers - POA -General surgery consultation obtained.   -1/27, patient underwent surgical debridement in OR by Dr. Harlow Asa.  He states that patient had a lot of dead tissues that needed debridement.  The bone is exposed and with high likelihood of osteomyelitis.  Recommended infectious disease input for potential need of long-term antibiotics.  E. coli UTI -More than 100,000 CFU per mL of E. Coli. -Continue IV Zosyn.  Progressive generalized physical and mental decline -Multifactorial: Multiple sclerosis, movement disorder, recent Covid infection, multiple mood altering medications -PT evaluation ordered. -Home MS  meds - dalfampridine, ocrelizumab -Chronic pain and other CNS meds- percocet, baclofen, norco, celexa, gabapentin, naloxone, ultram, MSIR 30 bid.  It is unclear if patient was taking all of them -Minimize the use of mood altering medications as much as possible.   -Currently on OxyIR 5 mg 3 times  daily as needed. -Resume baclofen, dalfampridine and ocrelizumab for MS  Essential hypertension -Home blood pressure meds include HCTZ 25 mg daily, metoprolol succinate 100 mg daily.   -Currently on metoprolol tartrate 25 mg twice daily.  HCTZ on hold.    Diabetes mellitus -A1c 6.5 on 1/26 -Home meds include Victoza 0.6 mg daily. -Currently on sliding scale insulin with  Accu-Cheks. Recent Labs  Lab 06/18/20 1202 06/18/20 1709 06/18/20 2232 06/19/20 0806  GLUCAP 148* 115* 120* 115*   Elevated troponin -Troponin was initially elevated to 112, trended down. -Currently not having anginal symptoms. -Echocardiogram pending -Continue aspirin and statin Recent Labs    06/16/20 2122 06/17/20 0230 06/18/20 0720  TROPONINIHS 112* 100* 58*   Hypokalemia/hypomagnesemia/hypophosphatemia -Daily monitoring and replacement.  Magnesium low at 1.6 today.  IV replacement given. -Start aggressive replacement with IV and oral. -Repeat tomorrow. Recent Labs  Lab 06/16/20 1532 06/16/20 1553 06/16/20 1640 06/17/20 0436 06/18/20 0720 06/19/20 0823  K 2.8* 2.8*  --  2.8* 2.7* 3.7  MG  --   --  1.6* 2.1 1.4* 1.6*  PHOS  --   --   --   --  1.8* 2.5   Hx of IBS  -on trulance and PPI  Melena Anemia of chronic disease -hemoglobin down from 11.1 on admission to 8.4 today.  -Patient had melanotic stool while in the OR. -Start IV Protonix.  Continue to monitor. Recent Labs    06/16/20 1553 06/16/20 2322 06/17/20 0436 06/18/20 0720 06/19/20 0823 06/19/20 0826  HGB 11.6* 9.7* 8.6* 8.6* 8.4*  --   MCV  --  79.6* 79.5* 80.7 79.8*  --   VITAMINB12  --   --   --   --   --  317  FOLATE  --   --   --   --   --  17.2  FERRITIN  --   --   --   --   --  349*  TIBC  --   --   --   --   --  141*  IRON  --   --   --   --   --  20*  RETICCTPCT  --   --   --   --   --  1.8   Mobility: Pending PT eval Code Status:   Code Status: Full Code  Nutritional status: Body mass index is 30.02 kg/m. Nutrition Problem: Moderate Malnutrition Etiology: chronic illness,wound healing (MS) Signs/Symptoms: energy intake < or equal to 75% for > or equal to 1 month,moderate fat depletion,mild muscle depletion Diet Order            Diet heart healthy/carb modified Room service appropriate? Yes; Fluid consistency: Thin  Diet effective now                 DVT  prophylaxis: heparin injection 5,000 Units Start: 06/16/20 2330 SCDs Start: 06/16/20 2323   Antimicrobials:  IV Zosyn and doxycycline Fluid: D5 LR at 100 mL/h Consultants: General surgery Family Communication:  None at bedside  Status is: Inpatient  Remains inpatient appropriate because: Underwent surgical debridement today.  Dispo: The patient is from: Home              Anticipated d/c is to: Home versus SNF, pending PT eval              Anticipated d/c date is: 3 days              Patient currently is  not medically stable to d/c.   Difficult to place patient No     Infusions:  . acetaminophen    . dextrose 5% lactated ringers 100 mL/hr at 06/18/20 1136  . magnesium sulfate bolus IVPB    . [MAR Hold] piperacillin-tazobactam (ZOSYN)  IV 3.375 g (06/19/20 0200)    Scheduled Meds: . [MAR Hold] (feeding supplement) PROSource Plus  30 mL Oral TID WC  . [MAR Hold] aspirin  81 mg Oral Daily  . [MAR Hold] benzonatate  100 mg Oral TID  . [MAR Hold] Chlorhexidine Gluconate Cloth  6 each Topical Daily  . [MAR Hold] dalfampridine  10 mg Oral BID  . doxycycline  100 mg Oral 2 times per day  . [MAR Hold] ferrous sulfate  325 mg Oral BID  . [MAR Hold] fluticasone furoate-vilanterol  1 puff Inhalation Daily  . [MAR Hold] heparin  5,000 Units Subcutaneous Q8H  . HYDROmorphone      . [MAR Hold] insulin aspart  0-5 Units Subcutaneous QHS  . [MAR Hold] insulin aspart  0-9 Units Subcutaneous TID WC  . [MAR Hold] metoprolol tartrate  25 mg Oral BID  . [MAR Hold] multivitamin with minerals  1 tablet Oral Daily  . [MAR Hold] oxybutynin  10 mg Oral Daily  . pantoprazole (PROTONIX) IV  40 mg Intravenous Q12H  . [MAR Hold] phosphorus  500 mg Oral Daily  . [MAR Hold] Plecanatide  1 tablet Oral Daily  . [MAR Hold] pravastatin  20 mg Oral q1800  . [MAR Hold] sodium hypochlorite   Topical BID  . [MAR Hold] tiZANidine  8 mg Oral TID    Antimicrobials: Anti-infectives (From admission, onward)    Start     Dose/Rate Route Frequency Ordered Stop   06/19/20 1400  doxycycline (VIBRA-TABS) tablet 100 mg        100 mg Oral 2 times per day 06/19/20 1324     06/17/20 1200  vancomycin (VANCOREADY) IVPB 750 mg/150 mL  Status:  Discontinued        750 mg 150 mL/hr over 60 Minutes Intravenous Every 12 hours 06/16/20 2115 06/19/20 1322   06/17/20 0015  [MAR Hold]  piperacillin-tazobactam (ZOSYN) IVPB 3.375 g        (MAR Hold since Thu 06/19/2020 at 0829.Hold Reason: Transfer to a Procedural area.)   3.375 g 12.5 mL/hr over 240 Minutes Intravenous Every 8 hours 06/17/20 0004     06/17/20 0000  piperacillin-tazobactam (ZOSYN) IVPB 3.375 g  Status:  Discontinued        3.375 g 100 mL/hr over 30 Minutes Intravenous Every 6 hours 06/16/20 2329 06/17/20 0003   06/16/20 2200  cefTRIAXone (ROCEPHIN) 2 g in sodium chloride 0.9 % 100 mL IVPB  Status:  Discontinued        2 g 200 mL/hr over 30 Minutes Intravenous Every 24 hours 06/16/20 2101 06/16/20 2329   06/16/20 2115  vancomycin (VANCOREADY) IVPB 1500 mg/300 mL        1,500 mg 150 mL/hr over 120 Minutes Intravenous  Once 06/16/20 2106 06/17/20 0235   06/16/20 1545  piperacillin-tazobactam (ZOSYN) IVPB 3.375 g        3.375 g 100 mL/hr over 30 Minutes Intravenous  Once 06/16/20 1533 06/16/20 1633      PRN meds: 0.9 % irrigation (POUR BTL), acetaminophen, [MAR Hold] docusate sodium, HYDROmorphone (DILAUDID) injection, HYDROmorphone (DILAUDID) injection, [MAR Hold] liver oil-zinc oxide, ondansetron (ZOFRAN) IV, [MAR Hold] oxyCODONE, oxyCODONE **OR** oxyCODONE, [MAR Hold] polyethylene glycol, [MAR Hold]  sodium chloride flush   Objective: Vitals:   06/19/20 1330 06/19/20 1345  BP: 104/81 (!) 113/54  Pulse: (!) 101 (!) 106  Resp: 17 18  Temp:    SpO2: (!) 87% 91%    Intake/Output Summary (Last 24 hours) at 06/19/2020 1424 Last data filed at 06/19/2020 1139 Gross per 24 hour  Intake 2066.02 ml  Output 1725 ml  Net 341.02 ml   Filed Weights    06/17/20 1145 06/18/20 0500 06/19/20 0844  Weight: 74.8 kg 81.8 kg 81.8 kg   Weight change:  Body mass index is 30.02 kg/m.   Physical Exam: General exam: Pleasant middle-aged African-American female. Chronically sick looking  Skin: No rashes, lesions or ulcers. HEENT: Atraumatic, normocephalic, no obvious bleeding Lungs: Clear to auscultation bilaterally CVS: Regular rate and rhythm, no murmur GI/Abd soft, nontender, nondistended, bowel sound present Back: Deferred to examination of wound care nurse and surgery for status of sacral ulcer CNS: Somnolent from the effect of anesthesia.  Not in distress.   Psychiatry: Unable to examine because of mental status Extremities: No pedal edema, no calf tenderness  Data Review: I have personally reviewed the laboratory data and studies available.  Recent Labs  Lab 06/16/20 1532 06/16/20 1553 06/16/20 2322 06/17/20 0436 06/18/20 0720 06/19/20 0823  WBC 23.5*  --  19.6* 18.1* 14.9* 18.1*  NEUTROABS 18.9*  --   --   --   --  14.3*  HGB 11.1* 11.6* 9.7* 8.6* 8.6* 8.4*  HCT 34.5* 34.0* 29.6* 26.3* 26.7* 25.7*  MCV 79.9*  --  79.6* 79.5* 80.7 79.8*  PLT 459*  --  445* 401* 413* 408*   Recent Labs  Lab 06/16/20 1532 06/16/20 1553 06/16/20 1640 06/16/20 2322 06/17/20 0436 06/18/20 0720 06/19/20 0823  NA 138 137  --   --  136 137 136  K 2.8* 2.8*  --   --  2.8* 2.7* 3.7  CL 97* 99  --   --  100 100 101  CO2 25  --   --   --  24 25 24   GLUCOSE 125* 122*  --   --  103* 122* 120*  BUN 41* 35*  --   --  21* 10 9  CREATININE 0.88 0.90  --  0.56 0.73 0.72 0.57  CALCIUM 9.3  --   --   --  8.5* 8.4* 8.4*  MG  --   --  1.6*  --  2.1 1.4* 1.6*  PHOS  --   --   --   --   --  1.8* 2.5    F/u labs ordered  Signed, Terrilee Croak, MD [facility] 06/19/2020

## 2020-06-19 NOTE — Progress Notes (Signed)
Patient has a temperature 102.5 F, PRN Tylenol 650 mg PO was given.

## 2020-06-19 NOTE — Progress Notes (Signed)
Patient is currently a yellow MEWS because her HR is between 120-130s, scheduled metoprolo 100mg  PO given, continue MEWS protocol.

## 2020-06-19 NOTE — Anesthesia Procedure Notes (Signed)
Procedure Name: LMA Insertion Date/Time: 06/19/2020 10:42 AM Performed by: [place] Quierra Silverio C, CRNA Pre-anesthesia Checklist: Patient identified, Emergency Drugs available, Suction available and Patient being monitored Patient Re-evaluated:Patient Re-evaluated prior to induction Oxygen Delivery Method: Circle system utilized Preoxygenation: Pre-oxygenation with 100% oxygen Induction Type: IV induction Ventilation: Mask ventilation without difficulty LMA: LMA inserted LMA Size: 4.0 Number of attempts: 1 Airway Equipment and Method: Bite block Placement Confirmation: positive ETCO2 Tube secured with: Tape Dental Injury: Teeth and Oropharynx as per pre-operative assessment

## 2020-06-19 NOTE — Interval H&P Note (Signed)
History and Physical Interval Note:  06/19/2020 10:29 AM  Tina Griffith  has presented today for surgery, with the diagnosis of SACRAL WOUND.  The various methods of treatment have been discussed with the patient and family. After consideration of risks, benefits and other options for treatment, the patient has consented to    Procedure(s): DEBRIDEMENT OF SACRAL WOUND (N/A) as a surgical intervention.    The patient's history has been reviewed, patient examined, no change in status, stable for surgery.  I have reviewed the patient's chart and labs.  Questions were answered to the patient's satisfaction.    Armandina Gemma, MD [facility] Office: [place]

## 2020-06-19 NOTE — Progress Notes (Addendum)
[facility]ious [facility]                                                                                        Patient Identification: Patient Name: Tina Griffith MRN: [id number] [place] Date: 06/16/2020  3:08 PM Today's Date: 06/19/2020 Reason for consult: Sacral Osteomyelitis  Principal Problem:   Septic shock ([place]) Active Problems:   Multiple sclerosis ([place])   Chronic kidney disease   Decubital ulcer   Essential (primary) hypertension   Hypomagnesemia   Anterior optic neuritis   Hemiplegia, spastic ([place])   Leucocytosis   UTI (urinary tract infection)   Wound infection   Malnutrition of moderate degree   Antibiotics: Pip/tazo 1/24 - c                    Vancomycin 1/24 - c  Lines/Tubes: left midline single lumen   Assessment Fevers   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 OR note reviewed with mention of lot of necrosis. No culture available   E coli UTI wiith 2/2 bacteremia ( GNR in 1/4 bottles )  Multiple sclerosis  Recommendations  Will change antibiotics to daptomycin and ertapenem to cover broadly given no cultures available. MRSA PCR positive  Fu blood cultures 1/28. High fevers yesterday  Fu identification of GNR isolated from anaerobic bottle on 1/24 Will treat as clinical osteomyelitis given exposed bone with stage 1V ulcer Monitor CBC, BMP and CPK on IV abx  Baseline ESR and CRP Management of contsipation/fecal impaction per primary  A follow up with [facility] has been made for long term IV antibiotics upon discharge  Dr Gale Journey is on call this weekend with questions. Otherwise ID team will follow on Monday.   Rest of the management as per the primary team. Please call with questions or concerns.  Thank you for the  consult __________________________________________________________________________________________________________ HPI and Hospital Course: Tina Griffith is a 60 y.o. female with a PMH of MS with left hemiplegia, Movement disorder, HTN, constipation and vision abnormalities who presented to the ED  by EMS from home on 1/24 for evaluation of gluteal wound. Patient is a poor historian and history is obtained from chart review. She was recently admitted to [facility] for COVID infection with respiratory failure. She was discharged home on December 26 and found to have a sacral wound at discharge. It seems patient had decubitus ulcer prior to her prior hospitalization.  one week prior to ED visit, she had increased pain in the sacral region along with having 2 to 3 black bowel movements daily. No reported h/o  fevers, chills, chest pain, shortness of breath, cough, nausea, vomiting. She lives at home with her son and has a caregiver that helps her for about five hours daily.  She has a history of multiple sclerosis, which has her confined to a wheelchair for mobility.  At baseline, she is able to bear weight with the assistance of a walker; however, she is  not able to ambulate.  She has a dense, left lower extremity motor deficit due to her multiple sclerosis.  At ED, she was afebrile but had leukocytosis up to 18.1. UA with large leukocytes ( >50 WBC), urine cx growing E coli. Cxray negative for acute findings CT abd/pelvis with following   IMPRESSION: Large sacral wound extends down to the coccyx without definitive change of acute osteomyelitis.  Large stool burden in the rectosigmoid colon concerning for fecal impaction.  Fibroid uterus.  Aortic atherosclerosis.  ROS: limited due to patient's clinical condition   Past Medical History:  Diagnosis Date  . Constipation   . Hypertension   . Movement disorder   . MS (multiple sclerosis) ([place])   . Vision  abnormalities    Past Surgical History:  Procedure Laterality Date  . COLOSTOMY    . COLOSTOMY REVERSAL    . HERNIA REPAIR    . KNEE SURGERY       No current facility-administered medications on file prior to encounter.   Current Outpatient Medications on File Prior to Encounter  Medication Sig Dispense Refill  . Ascorbic Acid (VITAMIN C) 1000 MG tablet Take 1,000 mg by mouth in the morning and at bedtime.    . ASPIRIN LOW DOSE 81 MG EC tablet Take 81 mg by mouth daily.    . benzonatate (TESSALON) 100 MG capsule Take 100 mg by mouth 3 (three) times daily.    Marland Kitchen dalfampridine 10 MG TB12 Take 1 tablet (10 mg total) by mouth 2 (two) times daily. 60 tablet 11  . FEROSUL 325 (65 Fe) MG tablet Take 325 mg by mouth 2 (two) times daily.    . hydrochlorothiazide (HYDRODIURIL) 25 MG tablet Take 25 mg by mouth daily.    Marland Kitchen liraglutide (VICTOZA) 18 MG/3ML SOPN Inject 0.6 mg into the skin daily.    Marland Kitchen LIVALO 2 MG TABS Take 1 tablet by mouth daily.    . metoprolol succinate (TOPROL-XL) 100 MG 24 hr tablet Take 100 mg by mouth daily.    . Misc Natural Products (AIRBORNE ELDERBERRY) CHEW Chew 2 tablets by mouth daily.    . Multiple Vitamin (MULTIVITAMIN ADULT) TABS Take 1 tablet by mouth daily.    Marland Kitchen omeprazole (PRILOSEC) 20 MG capsule Take 20 mg by mouth daily.    Marland Kitchen oxybutynin (DITROPAN-XL) 10 MG 24 hr tablet Take 10 mg by mouth daily.    Marland Kitchen oxyCODONE-acetaminophen (PERCOCET) 10-325 MG tablet Take 1 tablet by mouth 4 (four) times daily.    . potassium chloride (MICRO-K) 10 MEQ CR capsule Take 10 mEq by mouth 2 (two) times daily.    Marland Kitchen tiZANidine (ZANAFLEX) 4 MG tablet TAKE TWO (2) TABLETS THREE (3) TIMES DAILY (Patient taking differently: Take 8 mg by mouth 3 (three) times daily.) 180 tablet 5  . zinc gluconate 50 MG tablet Take 50 mg by mouth daily.    . baclofen (LIORESAL) 10 MG tablet TAKE ONE TABLET FOUR TIMES DAILY (Patient not taking: No sig reported) 120 each 11  . CALMOSEPTINE 0.44-20.6 % OINT  Apply 1 application topically 3 (three) times daily as needed (skin irritation).    . citalopram (CELEXA) 20 MG tablet TAKE 1 TABLET DAILY (Patient not taking: No sig reported) 30 tablet 6  . cyclobenzaprine (FLEXERIL) 5 MG tablet Take 1 tablet (5 mg total) by mouth at bedtime. (Patient not taking: No sig reported) 30 tablet 5  . DULoxetine (CYMBALTA) 60 MG capsule Take 1 capsule (60 mg total) by mouth daily. (  Patient not taking: No sig reported) 30 capsule 11  . gabapentin (NEURONTIN) 300 MG capsule Take 1 capsule (300 mg total) by mouth 3 (three) times daily. 90 capsule 11  . HYDROcodone-acetaminophen (NORCO) 10-325 MG tablet Take one tablet 4 times daily as needed. (Patient not taking: No sig reported) 120 tablet 0  . methylPREDNISolone (MEDROL DOSEPAK) 4 MG TBPK tablet Take 6 tablets on day 1, 5 tablets on day 2, 4 tablets on day 3, 3 tablets on day 4, 2 tablets on day 5, and 1 tablet on day 6. (Patient not taking: No sig reported) 21 tablet 0  . morphine (MSIR) 30 MG tablet Take 1 tablet (30 mg total) by mouth 2 (two) times daily. (Patient not taking: No sig reported) 60 tablet 0  . Naloxone HCl 0.4 MG/0.4ML SOAJ Use as directed if unable to arouse the patient (Patient not taking: No sig reported) 0.4 mL 2  . Ocrelizumab 300 MG/10ML SOLN Inject 600 mg into the vein every 6 (six) months.    Marland Kitchen omeprazole (PRILOSEC) 20 MG capsule TAKE 1 CAPSULE DAILY 30 MINUTES BEFORE BREAKFAST    . oxybutynin (DITROPAN) 5 MG tablet TAKE 2 TABLETS 3 TIMES A DAY. IF CAUSES DRY MOUTH DECREASE TO 1 TABLET 3 TIMES A DAY (Patient not taking: No sig reported) 180 tablet 11  . polyethylene glycol (MIRALAX / GLYCOLAX) packet Take 17 g by mouth daily. (Patient not taking: No sig reported) 30 each 5  . sulfamethoxazole-trimethoprim (BACTRIM DS,SEPTRA DS) 800-160 MG tablet Take 1 tablet by mouth 2 (two) times daily. (Patient not taking: No sig reported) 14 tablet 0  . SYMBICORT 160-4.5 MCG/ACT inhaler Inhale 2 puffs into the  lungs daily.    . traMADol (ULTRAM) 50 MG tablet Take 1 tablet 3 times daily as needed for pain. (Patient not taking: No sig reported) 90 tablet 2  . TRULANCE 3 MG TABS Take 1 tablet by mouth daily.       Allergies  Allergen Reactions  . No Known Allergies     Social History   Socioeconomic History  . Marital status: Single    Spouse name: Not on file  . Number of children: Not on file  . Years of education: Not on file  . Highest education level: Not on file  Occupational History  . Not on file  Tobacco Use  . Smoking status: Current Every Day Smoker    Packs/day: 1.00    Types: Cigarettes  . Smokeless tobacco: Never Used  Substance and Sexual Activity  . Alcohol use: No    Alcohol/week: 0.0 standard drinks  . Drug use: No  . Sexual activity: Not on file  Other Topics Concern  . Not on file  Social History Narrative  . Not on file   Social Determinants of Health   Financial Resource Strain: Not on file  Food Insecurity: Not on file  Transportation Needs: Not on file  Physical Activity: Not on file  Stress: Not on file  Social Connections: Not on file  Intimate Partner Violence: Not on file    Vitals BP (!) 100/56 (BP Location: Right Arm)   Pulse 88   Temp 99 F (37.2 C) (Oral)   Resp 17   Ht 5' 5"  (1.651 m)   Wt 81.8 kg   SpO2 100%   BMI 30.02 kg/m    Physical Exam Elderly sick looking lady lying in bed with eyes closed Has a nasal cannula, multiple mising teeth, no oral thrush Chest -  clear anteriorly CVS- normal s1s2, RRR Abdomen - soft Extremities - left sided hemiplegia Skin - no obvious rashes   Pertinent Lab seen by me: CBC Latest Ref Rng & Units 06/20/2020 06/19/2020 06/18/2020  WBC 4.0 - 10.5 K/uL 14.4(H) 18.1(H) 14.9(H)  Hemoglobin 12.0 - 15.0 g/dL 8.5(L) 8.4(L) 8.6(L)  Hematocrit 36.0 - 46.0 % 26.9(L) 25.7(L) 26.7(L)  Platelets 150 - 400 K/uL 391 408(H) 413(H)   CMP Latest Ref Rng & Units 06/20/2020 06/19/2020 06/18/2020  Glucose 70 -  99 mg/dL 100(H) 120(H) 122(H)  BUN 6 - 20 mg/dL 8 9 10   Creatinine 0.44 - 1.00 mg/dL 0.57 0.57 0.72  Sodium 135 - 145 mmol/L 135 136 137  Potassium 3.5 - 5.1 mmol/L 4.1 3.7 2.7(LL)  Chloride 98 - 111 mmol/L 102 101 100  CO2 22 - 32 mmol/L 23 24 25   Calcium 8.9 - 10.3 mg/dL 8.4(L) 8.4(L) 8.4(L)  Total Protein 6.5 - 8.1 g/dL - - 5.0(L)  Total Bilirubin 0.3 - 1.2 mg/dL - - 0.5  Alkaline Phos 38 - 126 U/L - - 86  AST 15 - 41 U/L - - 18  ALT 0 - 44 U/L - - 16    Pertinent Imagings/Other Imagings Plain films and CT images have been personally visualized and interpreted; radiology reports have been reviewed. Decision making incorporated into the Impression / Recommendations.   CT abdomen/pelvis 06/16/20 FINDINGS: Lower chest: No acute abnormality.  Hepatobiliary: No focal hepatic abnormality. Gallbladder unremarkable.  Pancreas: No focal abnormality or ductal dilatation.  Spleen: No focal abnormality.  Normal size.  Adrenals/Urinary Tract: No adrenal abnormality. No focal renal abnormality. No stones or hydronephrosis. Urinary bladder is unremarkable.  Stomach/Bowel: Large stool burden in the rectosigmoid colon. No evidence of bowel obstruction.  Vascular/Lymphatic: Aortic atherosclerosis. No evidence of aneurysm or adenopathy.  Reproductive: Fibroid uterus, unchanged.  No adnexal mass.  Other: No free fluid or free air.  Musculoskeletal: Large open sacral decubitus ulcer/wound noted. This extends to the coccyx without definite acute osteomyelitis.  IMPRESSION: Large sacral wound extends down to the coccyx without definitive change of acute osteomyelitis.  Large stool burden in the rectosigmoid colon concerning for fecal impaction.  Fibroid uterus.  Aortic atherosclerosis.  I have spent greater than 60 minutes for this patient encounter including review of prior medical records with greater than 50% of time being face to face and coordination of their  care.  Electronically signed by:   Rosiland Oz, MD Infectious Disease Physician [facility] Pager: [phone]

## 2020-06-19 NOTE — Anesthesia Postprocedure Evaluation (Signed)
Anesthesia Post Note  Patient: Tina Griffith  Procedure(s) Performed: DEBRIDEMENT OF SACRAL WOUND (Right Back)     Patient location during evaluation: PACU Anesthesia Type: General Level of consciousness: awake and alert Pain management: pain level controlled Vital Signs Assessment: post-procedure vital signs reviewed and stable Respiratory status: spontaneous breathing, nonlabored ventilation, respiratory function stable and patient connected to nasal cannula oxygen Cardiovascular status: blood pressure returned to baseline and stable Postop Assessment: no apparent nausea or vomiting Anesthetic complications: no   No complications documented.  Last Vitals:  Vitals:   06/19/20 1330 06/19/20 1345  BP: 104/81 (!) 113/54  Pulse: (!) 101 (!) 106  Resp: 17 18  Temp:    SpO2: (!) 87% 91%    Last Pain:  Vitals:   06/19/20 1242  TempSrc:   PainSc: [name]

## 2020-06-20 ENCOUNTER — Encounter (HOSPITAL_COMMUNITY): Payer: Self-pay | Admitting: Surgery

## 2020-06-20 DIAGNOSIS — R6521 Severe sepsis with septic shock: Secondary | ICD-10-CM | POA: Diagnosis not present

## 2020-06-20 DIAGNOSIS — A419 Sepsis, unspecified organism: Secondary | ICD-10-CM | POA: Diagnosis not present

## 2020-06-20 LAB — BLOOD CULTURE ID PANEL (REFLEXED) - BCID2

## 2020-06-20 LAB — PHOSPHORUS: Phosphorus: 2.3 mg/dL — ABNORMAL LOW (ref 2.5–4.6)

## 2020-06-20 LAB — CBC WITH DIFFERENTIAL/PLATELET
Abs Immature Granulocytes: 0.38 10*3/uL — ABNORMAL HIGH (ref 0.00–0.07)
Basophils Absolute: 0.1 10*3/uL (ref 0.0–0.1)
Basophils Relative: 1 %
Eosinophils Absolute: 0.1 10*3/uL (ref 0.0–0.5)
Eosinophils Relative: 1 %
HCT: 26.9 % — ABNORMAL LOW (ref 36.0–46.0)
Hemoglobin: 8.5 g/dL — ABNORMAL LOW (ref 12.0–15.0)
Immature Granulocytes: 3 %
Lymphocytes Relative: 10 %
Lymphs Abs: 1.4 10*3/uL (ref 0.7–4.0)
MCH: 26.3 pg (ref 26.0–34.0)
MCHC: 31.6 g/dL (ref 30.0–36.0)
MCV: 83.3 fL (ref 80.0–100.0)
Monocytes Absolute: 0.8 10*3/uL (ref 0.1–1.0)
Monocytes Relative: 6 %
Neutro Abs: 11.6 10*3/uL — ABNORMAL HIGH (ref 1.7–7.7)
Neutrophils Relative %: 79 %
Platelets: 391 10*3/uL (ref 150–400)
RBC: 3.23 MIL/uL — ABNORMAL LOW (ref 3.87–5.11)
RDW: 20.3 % — ABNORMAL HIGH (ref 11.5–15.5)
WBC: 14.4 10*3/uL — ABNORMAL HIGH (ref 4.0–10.5)
nRBC: 0 % (ref 0.0–0.2)

## 2020-06-20 LAB — BASIC METABOLIC PANEL
Anion gap: 10 (ref 5–15)
BUN: 8 mg/dL (ref 6–20)
CO2: 23 mmol/L (ref 22–32)
Calcium: 8.4 mg/dL — ABNORMAL LOW (ref 8.9–10.3)
Chloride: 102 mmol/L (ref 98–111)
Creatinine, Ser: 0.57 mg/dL (ref 0.44–1.00)
GFR, Estimated: >60 mL/min (ref >60)
Glucose, Bld: 100 mg/dL — ABNORMAL HIGH (ref 70–99)
Potassium: 4.1 mmol/L (ref 3.5–5.1)
Sodium: 135 mmol/L (ref 135–145)

## 2020-06-20 LAB — GLUCOSE, CAPILLARY
Glucose-Capillary: 111 mg/dL — ABNORMAL HIGH (ref 70–99)
Glucose-Capillary: 99 mg/dL (ref 70–99)
Glucose-Capillary: 105 mg/dL — ABNORMAL HIGH (ref 70–99)
Glucose-Capillary: 114 mg/dL — ABNORMAL HIGH (ref 70–99)
Glucose-Capillary: 107 mg/dL — ABNORMAL HIGH (ref 70–99)

## 2020-06-20 MED ORDER — SODIUM CHLORIDE 0.9 % IV SOLN: INTRAVENOUS | Status: DC

## 2020-06-20 MED ORDER — SODIUM CHLORIDE 0.9 % IV SOLN
1.0000 g | INTRAVENOUS | Status: DC
  Administered 2020-06-20 – 2020-06-21 (×2): 1000 mg via INTRAVENOUS
  Filled 2020-06-20 (×3): qty 1

## 2020-06-20 MED ORDER — SODIUM CHLORIDE 0.9 % IV SOLN
650.0000 mg | Freq: Every day | INTRAVENOUS | Status: DC
  Administered 2020-06-20 – 2020-06-21 (×2): 650 mg via INTRAVENOUS
  Filled 2020-06-20 (×3): qty 13

## 2020-06-20 MED ORDER — POTASSIUM PHOSPHATES 15 MMOLE/5ML IV SOLN
30.0000 mmol | Freq: Once | INTRAVENOUS | Status: AC
  Administered 2020-06-20: 15:00:00 30 mmol via INTRAVENOUS
  Filled 2020-06-20: qty 10

## 2020-06-20 NOTE — Care Management Important Message (Signed)
Important Message  Patient Details IM Letter given to the Patient. Name: Tina Griffith MRN: [id number] Date of Birth: Jun 02, 1960   Medicare Important Message Given:  Yes     Kerin Salen 06/20/2020, 2:44 PM

## 2020-06-20 NOTE — TOC Progression Note (Signed)
Transition of Care [facility]) - Progression Note    Patient Details  Name: Tina Griffith MRN: [id number] Date of Birth: 08/04/1960  [facility] CM/SW Contact  Lennart Pall, LCSW Phone Number: 06/20/2020, 12:57 PM  Clinical Narrative:    Have spoken with pt and daughter, Tina Griffith, today to touch base on dc plans.  Pt very lethargic and does not engage much.  Daughter has been staying updated by MD teams and has good understanding of current status.  She confirms that she and her brother feel that pt will need some level of post acute care at dc in order to rebuild strength and for wound care.  She asks for clarification on CIR vs SNF vs LTACH and we spoke about these differences.  I am doubtful that pt could participate with the intensity of CIR, however, will have therapies make their recommendations.  The acuity of the wound care may require LTACH but I believe that SNF will likely be the final decision.  TOC will continue to follow and await dc recommendations from team.   Expected Discharge Plan: [place] (vs home with [facility]) Barriers to Discharge: Continued Medical Work up  Expected Discharge Plan and Services Expected Discharge Plan: [place] (vs home with [facility]) In-house Referral: Clinical Social Work     Living arrangements for the past 2 months: Single Family Home                                       Social Determinants of Health (SDOH) Interventions    Readmission Risk Interventions Readmission Risk Prevention Plan 06/18/2020  Transportation Screening Complete  PCP or Specialist Appt within 5-7 Days Complete  Home Care Screening Complete  Medication Review (RN CM) Complete  Some recent data might be hidden

## 2020-06-20 NOTE — Progress Notes (Signed)
[facility] Progress Note  1 Day Post-Op  Subjective: CC:  Resting comfortably   Objective: Vital signs in last 24 hours: Temp:  [97.3 F (36.3 C)-103 F (39.4 C)] 99 F (37.2 C) (01/28 0555) Pulse Rate:  [88-129] 115 (01/28 1029) Resp:  [14-22] 17 (01/28 0555) BP: (90-132)/(36-93) 100/71 (01/28 1029) SpO2:  [42 %-100 %] 100 % (01/28 0753) Last BM Date: 06/17/20  Intake/Output from previous day: 01/27 0701 - 01/28 0700 In: 1464.8 [P.O.:240; I.V.:1024.7; IV Piggyback:200.1] Out: 1165 [Urine:1140; Blood:25] Intake/Output this shift: Total I/O In: 240 [P.O.:240] Out: 250 [Urine:250]  PE: Gen:  Alert, NAD, pleasant Card:  Regular rate and rhythm, pedal pulses 2+ BL Pulm:  Normal effort Abd: Soft, non-tender, non-distended, bowel sounds present in all 4 quadrants Skin: warm and dry, no rashes  Psych: A&Ox3   Lab Results:  Recent Labs    06/19/20 0823 06/20/20 0546  WBC 18.1* 14.4*  HGB 8.4* 8.5*  HCT 25.7* 26.9*  PLT 408* 391   BMET Recent Labs    06/19/20 0823 06/20/20 0546  NA 136 135  K 3.7 4.1  CL 101 102  CO2 24 23  GLUCOSE 120* 100*  BUN 9 8  CREATININE 0.57 0.57  CALCIUM 8.4* 8.4*   PT/INR No results for input(s): LABPROT, INR in the last 72 hours. CMP     Component Value Date/Time   NA 135 06/20/2020 0546   K 4.1 06/20/2020 0546   CL 102 06/20/2020 0546   CO2 23 06/20/2020 0546   GLUCOSE 100 (H) 06/20/2020 0546   BUN 8 06/20/2020 0546   CREATININE 0.57 06/20/2020 0546   CALCIUM 8.4 (L) 06/20/2020 0546   PROT 5.0 (L) 06/18/2020 0720   PROT 7.4 09/16/2015 1347   ALBUMIN 1.9 (L) 06/18/2020 0720   ALBUMIN 4.4 09/16/2015 1347   AST 18 06/18/2020 0720   ALT 16 06/18/2020 0720   ALKPHOS 86 06/18/2020 0720   BILITOT 0.5 06/18/2020 0720   BILITOT 0.5 09/16/2015 1347   GFRNONAA >60 06/20/2020 0546   GFRAA >60 10/16/2014 1320   Lipase     Component Value Date/Time   LIPASE 15 04/01/2012 1815        Studies/Results: ECHOCARDIOGRAM COMPLETE  Result Date: 06/18/2020    ECHOCARDIOGRAM REPORT   Patient Name:   Tina Griffith Date of Exam: 06/18/2020 Medical Rec #:  [id number]      Height:       65.0 in Accession #:    [id number]     Weight:       165.0 lb Date of Birth:  04/22/1961      BSA:          1.823 m Patient Age:    60 years       BP:           143/66 mmHg Patient Gender: F              HR:           87 bpm. Exam Location:  Inpatient Procedure: 2D Echo, 3D Echo, Cardiac Doppler and Color Doppler Indications:    121-121.4 ST elevation (STEMI) and non-ST elevation (NSTEMI)                 myocardial infarction  History:        Patient has no prior history of Echocardiogram examinations.                 Risk Factors:Dyslipidemia and Current Smoker.  Septic shock.                 Sickle cell.  Sonographer:    Roseanna Rainbow RDCS Referring Phys: [id number] [facility]  Sonographer Comments: Suboptimal parasternal window. IMPRESSIONS  1. Abnormal septal motion . Left ventricular ejection fraction, by estimation, is 60 to 65%. The left ventricle has normal function. The left ventricle has no regional wall motion abnormalities. There is mild left ventricular hypertrophy. Left ventricular diastolic parameters were normal.  2. Right ventricular systolic function is mildly reduced. The right ventricular size is mildly enlarged. There is mildly elevated pulmonary artery systolic pressure.  3. The mitral valve is normal in structure. Trivial mitral valve regurgitation. No evidence of mitral stenosis.  4. The aortic valve is tricuspid. Aortic valve regurgitation is not visualized. No aortic stenosis is present.  5. The inferior vena cava is normal in size with greater than 50% respiratory variability, suggesting right atrial pressure of 3 mmHg. FINDINGS  Left Ventricle: Abnormal septal motion. Left ventricular ejection fraction, by estimation, is 60 to 65%. The left ventricle has normal function. The left  ventricle has no regional wall motion abnormalities. The left ventricular internal cavity size was normal in size. There is mild left ventricular hypertrophy. Left ventricular diastolic parameters were normal. Right Ventricle: The right ventricular size is mildly enlarged. No increase in right ventricular wall thickness. Right ventricular systolic function is mildly reduced. There is mildly elevated pulmonary artery systolic pressure. The tricuspid regurgitant  velocity is 2.71 m/s, and with an assumed right atrial pressure of 8 mmHg, the estimated right ventricular systolic pressure is 74.9 mmHg. Left Atrium: Left atrial size was normal in size. Right Atrium: Right atrial size was normal in size. Pericardium: There is no evidence of pericardial effusion. Mitral Valve: The mitral valve is normal in structure. Trivial mitral valve regurgitation. No evidence of mitral valve stenosis. Tricuspid Valve: The tricuspid valve is normal in structure. Tricuspid valve regurgitation is mild . No evidence of tricuspid stenosis. Aortic Valve: The aortic valve is tricuspid. Aortic valve regurgitation is not visualized. No aortic stenosis is present. Pulmonic Valve: The pulmonic valve was normal in structure. Pulmonic valve regurgitation is not visualized. No evidence of pulmonic stenosis. Aorta: The aortic root is normal in size and structure. Venous: The inferior vena cava is normal in size with greater than 50% respiratory variability, suggesting right atrial pressure of 3 mmHg. IAS/Shunts: The interatrial septum was not well visualized.  LEFT VENTRICLE PLAX 2D LVIDd:         3.80 cm     Diastology LVIDs:         2.30 cm     LV e' medial:    7.29 cm/s LV PW:         1.20 cm     LV E/e' medial:  11.5 LV IVS:        1.20 cm     LV e' lateral:   12.70 cm/s LVOT diam:     2.10 cm     LV E/e' lateral: 6.6 LV SV:         69 LV SV Index:   38 LVOT Area:     3.46 cm  LV Volumes (MOD) LV vol d, MOD A2C: 57.1 ml LV vol d, MOD A4C: 76.1  ml LV vol s, MOD A2C: 22.1 ml LV vol s, MOD A4C: 32.0 ml LV SV MOD A2C:     35.0 ml LV SV MOD A4C:  76.1 ml LV SV MOD BP:      37.9 ml RIGHT VENTRICLE             IVC RV S prime:     15.40 cm/s  IVC diam: 1.60 cm TAPSE (M-mode): 1.8 cm LEFT ATRIUM           Index       RIGHT ATRIUM           Index LA diam:      2.10 cm 1.15 cm/m  RA Area:     17.10 cm LA Vol (A2C): 23.3 ml 12.78 ml/m RA Volume:   50.00 ml  27.43 ml/m LA Vol (A4C): 11.3 ml 6.20 ml/m  AORTIC VALVE LVOT Vmax:   123.00 cm/s LVOT Vmean:  86.200 cm/s LVOT VTI:    0.198 m  AORTA Ao Root diam: 3.00 cm Ao Asc diam:  3.00 cm MITRAL VALVE               TRICUSPID VALVE MV Area (PHT): 5.66 cm    TR Peak grad:   29.4 mmHg MV Decel Time: 134 msec    TR Vmax:        271.00 cm/s MV E velocity: 83.80 cm/s MV A velocity: 75.00 cm/s  SHUNTS MV E/A ratio:  1.12        Systemic VTI:  0.20 m                            Systemic Diam: 2.10 cm Jenkins Rouge MD Electronically signed by Jenkins Rouge MD Signature Date/Time: 06/18/2020/2:47:27 PM    Final     Anti-infectives: Anti-infectives (From admission, onward)   Start     Dose/Rate Route Frequency Ordered Stop   06/19/20 2000  doxycycline (VIBRA-TABS) tablet 100 mg        100 mg Oral 2 times per day 06/19/20 1324     06/17/20 1200  vancomycin (VANCOREADY) IVPB 750 mg/150 mL  Status:  Discontinued        750 mg 150 mL/hr over 60 Minutes Intravenous Every 12 hours 06/16/20 2115 06/19/20 1322   06/17/20 0015  piperacillin-tazobactam (ZOSYN) IVPB 3.375 g        3.375 g 12.5 mL/hr over 240 Minutes Intravenous Every 8 hours 06/17/20 0004     06/17/20 0000  piperacillin-tazobactam (ZOSYN) IVPB 3.375 g  Status:  Discontinued        3.375 g 100 mL/hr over 30 Minutes Intravenous Every 6 hours 06/16/20 2329 06/17/20 0003   06/16/20 2200  cefTRIAXone (ROCEPHIN) 2 g in sodium chloride 0.9 % 100 mL IVPB  Status:  Discontinued        2 g 200 mL/hr over 30 Minutes Intravenous Every 24 hours 06/16/20 2101 06/16/20  2329   06/16/20 2115  vancomycin (VANCOREADY) IVPB 1500 mg/300 mL        1,500 mg 150 mL/hr over 120 Minutes Intravenous  Once 06/16/20 2106 06/17/20 0235   06/16/20 1545  piperacillin-tazobactam (ZOSYN) IVPB 3.375 g        3.375 g 100 mL/hr over 30 Minutes Intravenous  Once 06/16/20 1533 06/16/20 1633     Assessment/Plan Multiple sclerosis, left hemiplegia -limited mobility COVID 19 virus infection with acute hypoxic respiratory failure 12/21-04/2620 Tobacco use Hx constipation Hypertension Hx colostomy/colostomy reversal On multiple medications Hypothyroid Anemia  Hx AKI Probable malnutrition  Sepsis with decubitus ulcer and UTI UTI - > 100 K E. coli Stage IV  right buttocks decubitus ulcer/osteomyelitis S/p sharp debridement with removal of necrotic skin, adipose tissue, fascia, and muscle.  Approximate amount of tissue debrided is 15 cm x 10 cm x 5 cm. 06/19/20 - some fevers overnight, monitor - IV abx per ID - start moist-to-dry dressing changes BID and as needed for contamination with stool  - will perform wound check Monday 1/31 to make sure no further debridement is warranted.   FEN: IV fluids/heart healthy/Cardama diet ID: Zosyn 1/24 >>  DVT: Heparin Follow-up: [facility] wound clinic     LOS: 4 days    Obie Dredge, [facility] Please see Amion for pager number during day hours 7:00am-4:30pm

## 2020-06-20 NOTE — Consult Note (Signed)
Please go over progress note from today by Rosiland Oz  for full consult note

## 2020-06-20 NOTE — Progress Notes (Signed)
PROGRESS NOTE  Lucky Cowboy  DOB: 1960/09/09  PCP: [facility] NID:[id number]  DOA: 06/16/2020  LOS: 4 days   Chief Complaint  Patient presents with  . Wound Infection  . Fatigue   Brief narrative: Tina Griffith is a 60 y.o. female with PMH significant for multiple sclerosis, movement disorder, hypertension, constipation and COVID-19 pneumonia in December 2021.  At baseline, she was using a walker and wheelchair up until [place] admission.  Since then she has remained bedbound at home and has developed sacral decubitus ulcers. 1/24, patient was brought to the ED with complaint of failure to thrive, worsening of gluteal/sacral pressure sores.  Since last hospitalization 4 weeks ago for Covid pneumonia, patient has had anorexia, weight loss, generalized fatigue and worsening lower extremity edema.  Patient was admitted for sepsis secondary to infected sacral decubitus ulcer. 1/27, patient underwent debridement of sacral decubitus ulcer by Dr. Harlow Asa.   Subjective: Patient was seen and examined this morning.  Alert, awake, oriented x3.  She was getting supervised feeding by nurse tech. No family at bedside.  Assessment/Plan: Sepsis - POA, no evidence of septic shock -Likely source sacral decubitus ulcer.  Also contributed by UTI. -Status post debridement of ulcer. -Blood cultures did not show any growth so far. -Currently on IV Zosyn and oral doxycycline. -WBC count and lactic acid level improving. -Continue IV fluids. -ID consult pending. Recent Labs  Lab 06/16/20 1532 06/16/20 1850 06/16/20 2101 06/16/20 2300 06/16/20 2322 06/17/20 0230 06/17/20 0436 06/18/20 0720 06/19/20 0823 06/20/20 0546  WBC 23.5*  --   --   --  19.6*  --  18.1* 14.9* 18.1* 14.4*  LATICACIDVEN 1.6 4.1*  --  1.8  --  2.9*  --  1.1  --   --   PROCALCITON  --   --  1.98  --   --   --   --   --   --   --    Stage IV sacral decubitus ulcers - POA -General surgery consultation obtained.    -1/27, patient underwent surgical debridement in OR by Dr. Harlow Asa.  He states that patient had a lot of dead tissues that needed debridement.  The bone is exposed and with high likelihood of osteomyelitis.  -May need long-term antibiotics.  Defer to ID.  E. coli UTI -More than 100,000 CFU per mL of E. Coli. -Continue IV Zosyn.  Progressive generalized physical and mental decline -Multifactorial: Multiple sclerosis, movement disorder, recent Covid infection, multiple mood altering medications -PT evaluation ordered. -Home MS  meds - dalfampridine, ocrelizumab -Chronic pain and other CNS meds- percocet, baclofen, norco, celexa, gabapentin, naloxone, ultram, MSIR 30 bid.  It is unclear if patient was taking all of them -Minimize the use of mood altering medications as much as possible.   -Currently on OxyIR 5 mg 3 times daily as needed. -Resume baclofen, dalfampridine and ocrelizumab for MS  Essential hypertension -Home blood pressure meds include HCTZ 25 mg daily, metoprolol succinate 100 mg daily.   -Currently on metoprolol tartrate 25 mg twice daily.  HCTZ on hold.    Diabetes mellitus -A1c 6.5 on 1/26 -Home meds include Victoza 0.6 mg daily. -Currently on sliding scale insulin with Accu-Cheks. Recent Labs  Lab 06/19/20 0806 06/19/20 1648 06/19/20 2209 06/20/20 0738 06/20/20 1147  GLUCAP 115* 80 111* 99 105*   Elevated troponin -Troponin was initially elevated to 112, trended down. -Currently not having anginal symptoms. -Echocardiogram pending -Continue aspirin and statin Recent Labs    06/18/20  0720  TROPONINIHS 58*   Hypokalemia/hypomagnesemia/hypophosphatemia -Daily monitoring and replacement.  -Labs this morning with phosphorus low at 2.3.  IV replacement ordered. -Repeat tomorrow. Recent Labs  Lab 06/16/20 1553 06/16/20 1640 06/17/20 0436 06/18/20 0720 06/19/20 0823 06/20/20 0546  K 2.8*  --  2.8* 2.7* 3.7 4.1  MG  --  1.6* 2.1 1.4* 1.6*  --   PHOS  --    --   --  1.8* 2.5 2.3*   Hx of IBS  -on trulance and PPI  Melena Anemia of chronic disease -hemoglobin down from 11.1 on admission to 8.5 today.  -Patient had melanotic stool while in the OR. -Currently on IV Protonix.  Continue to monitor. Recent Labs    06/16/20 2322 06/17/20 0436 06/18/20 0720 06/19/20 0823 06/19/20 0826 06/20/20 0546  HGB 9.7* 8.6* 8.6* 8.4*  --  8.5*  MCV 79.6* 79.5* 80.7 79.8*  --  83.3  VITAMINB12  --   --   --   --  317  --   FOLATE  --   --   --   --  17.2  --   FERRITIN  --   --   --   --  349*  --   TIBC  --   --   --   --  141*  --   IRON  --   --   --   --  20*  --   RETICCTPCT  --   --   --   --  1.8  --    Mobility: Pending PT eval Code Status:   Code Status: Full Code  Nutritional status: Body mass index is 30.02 kg/m. Nutrition Problem: Moderate Malnutrition Etiology: chronic illness,wound healing (MS) Signs/Symptoms: energy intake < or equal to 75% for > or equal to 1 month,moderate fat depletion,mild muscle depletion Diet Order            Diet Carb Modified Fluid consistency: Thin; Room service appropriate? Yes  Diet effective now                 DVT prophylaxis: enoxaparin (LOVENOX) injection 40 mg Start: 06/20/20 0800 SCD's Start: 06/19/20 1520 SCDs Start: 06/16/20 2323   Antimicrobials:  IV Zosyn and doxycycline Fluid: D5 LR at 100 mL/h Consultants: General surgery Family Communication:  None at bedside  Status is: Inpatient  Remains inpatient appropriate because: Underwent surgical debridement yesterday.  Deep wound.  SNF versus LTAC  Dispo: The patient is from: Home              Anticipated d/c is to: Pending PT eval, SNF versus LTAC likely              Anticipated d/c date is: 3 days              Patient currently is not medically stable to d/c.   Difficult to place patient No     Infusions:  . sodium chloride 75 mL/hr at 06/20/20 0835  . DAPTOmycin (CUBICIN)  IV    . dextrose 5% lactated ringers 100  mL/hr at 06/18/20 1136  . piperacillin-tazobactam (ZOSYN)  IV 3.375 g (06/20/20 1041)  . potassium PHOSPHATE IVPB (in mmol)      Scheduled Meds: . (feeding supplement) PROSource Plus  30 mL Oral TID WC  . aspirin EC  81 mg Oral Daily  . benzonatate  100 mg Oral TID  . Chlorhexidine Gluconate Cloth  6 each Topical Daily  . Chlorhexidine Gluconate Cloth  6 each Topical Q0600  . dalfampridine  10 mg Oral BID  . enoxaparin (LOVENOX) injection  40 mg Subcutaneous Q24H  . ferrous sulfate  325 mg Oral BID  . fluticasone furoate-vilanterol  1 puff Inhalation Daily  . hydrochlorothiazide  25 mg Oral Daily  . insulin aspart  0-5 Units Subcutaneous QHS  . insulin aspart  0-9 Units Subcutaneous TID WC  . metoprolol succinate  100 mg Oral Daily  . multivitamin with minerals  1 tablet Oral Daily  . mupirocin ointment  1 application Nasal BID  . oxybutynin  10 mg Oral Daily  . pantoprazole  40 mg Oral Daily  . Plecanatide  1 tablet Oral Daily  . pravastatin  20 mg Oral q1800  . tiZANidine  8 mg Oral TID    Antimicrobials: Anti-infectives (From admission, onward)   Start     Dose/Rate Route Frequency Ordered Stop   06/20/20 2000  DAPTOmycin (CUBICIN) 650 mg in sodium chloride 0.9 % IVPB        650 mg 226 mL/hr over 30 Minutes Intravenous Daily 06/20/20 1320     06/19/20 2000  doxycycline (VIBRA-TABS) tablet 100 mg  Status:  Discontinued        100 mg Oral 2 times per day 06/19/20 1324 06/20/20 1320   06/17/20 1200  vancomycin (VANCOREADY) IVPB 750 mg/150 mL  Status:  Discontinued        750 mg 150 mL/hr over 60 Minutes Intravenous Every 12 hours 06/16/20 2115 06/19/20 1322   06/17/20 0015  piperacillin-tazobactam (ZOSYN) IVPB 3.375 g        3.375 g 12.5 mL/hr over 240 Minutes Intravenous Every 8 hours 06/17/20 0004     06/17/20 0000  piperacillin-tazobactam (ZOSYN) IVPB 3.375 g  Status:  Discontinued        3.375 g 100 mL/hr over 30 Minutes Intravenous Every 6 hours 06/16/20 2329 06/17/20  0003   06/16/20 2200  cefTRIAXone (ROCEPHIN) 2 g in sodium chloride 0.9 % 100 mL IVPB  Status:  Discontinued        2 g 200 mL/hr over 30 Minutes Intravenous Every 24 hours 06/16/20 2101 06/16/20 2329   06/16/20 2115  vancomycin (VANCOREADY) IVPB 1500 mg/300 mL        1,500 mg 150 mL/hr over 120 Minutes Intravenous  Once 06/16/20 2106 06/17/20 0235   06/16/20 1545  piperacillin-tazobactam (ZOSYN) IVPB 3.375 g        3.375 g 100 mL/hr over 30 Minutes Intravenous  Once 06/16/20 1533 06/16/20 1633      PRN meds: acetaminophen **OR** acetaminophen, docusate sodium, HYDROmorphone (DILAUDID) injection, liver oil-zinc oxide, ondansetron **OR** ondansetron (ZOFRAN) IV, oxyCODONE, oxyCODONE, polyethylene glycol, sodium chloride flush, traMADol   Objective: Vitals:   06/20/20 1029 06/20/20 1255  BP: 100/71 (!) 101/49  Pulse: (!) 115 94  Resp:  20  Temp:  98.5 F (36.9 C)  SpO2:  100%    Intake/Output Summary (Last 24 hours) at 06/20/2020 1321 Last data filed at 06/20/2020 0935 Gross per 24 hour  Intake 904.82 ml  Output 990 ml  Net -85.18 ml   Filed Weights   06/17/20 1145 06/18/20 0500 06/19/20 0844  Weight: 74.8 kg 81.8 kg 81.8 kg   Weight change:  Body mass index is 30.02 kg/m.   Physical Exam: General exam: Pleasant middle-aged African-American female.  Comfortable in bed.  Pain controlled. Skin: No rashes, lesions or ulcers. HEENT: Atraumatic, normocephalic, no obvious bleeding Lungs: Clear to auscultation bilaterally CVS: Regular  rate and rhythm, no murmur GI/Abd soft, nontender, nondistended, bowel sound present Back: Deferred to examination of wound care nurse and surgery for status of sacral ulcer CNS: Alert, awake monitor x3 Psychiatry: Mood appropriate Extremities: No pedal edema, no calf tenderness  Data Review: I have personally reviewed the laboratory data and studies available.  Recent Labs  Lab 06/16/20 1532 06/16/20 1553 06/16/20 2322 06/17/20 0436  06/18/20 0720 06/19/20 0823 06/20/20 0546  WBC 23.5*  --  19.6* 18.1* 14.9* 18.1* 14.4*  NEUTROABS 18.9*  --   --   --   --  14.3* 11.6*  HGB 11.1*   < > 9.7* 8.6* 8.6* 8.4* 8.5*  HCT 34.5*   < > 29.6* 26.3* 26.7* 25.7* 26.9*  MCV 79.9*  --  79.6* 79.5* 80.7 79.8* 83.3  PLT 459*  --  445* 401* 413* 408* 391   < > = values in this interval not displayed.   Recent Labs  Lab 06/16/20 1532 06/16/20 1553 06/16/20 1640 06/16/20 2322 06/17/20 0436 06/18/20 0720 06/19/20 0823 06/20/20 0546  NA 138 137  --   --  136 137 136 135  K 2.8* 2.8*  --   --  2.8* 2.7* 3.7 4.1  CL 97* 99  --   --  100 100 101 102  CO2 25  --   --   --  24 25 24 23   GLUCOSE 125* 122*  --   --  103* 122* 120* 100*  BUN 41* 35*  --   --  21* 10 9 8   CREATININE 0.88 0.90  --  0.56 0.73 0.72 0.57 0.57  CALCIUM 9.3  --   --   --  8.5* 8.4* 8.4* 8.4*  MG  --   --  1.6*  --  2.1 1.4* 1.6*  --   PHOS  --   --   --   --   --  1.8* 2.5 2.3*    F/u labs ordered  Signed, Terrilee Croak, MD [facility] 06/20/2020

## 2020-06-20 NOTE — Evaluation (Signed)
Physical Therapy Evaluation Patient Details Name: Tina Griffith MRN: [id number] DOB: March 14, 1961 Today's Date: 06/20/2020   History of Present Illness  Patient is 60 y.o. female with PMH significant for MS, Lt hemiplegia, HTN, and diagnosed with COVID-19 pneumonia in December 2021.  At baseline, she was using a walker and wheelchair PTA for COVID.  Since then she has remained bedbound at home and has developed sacral decubitus ulcers. Patient admitted for sepsis secondary to infected sacral decubitus ulcer and underwent surgical debridement on 1/27.    Clinical Impression  Tina Griffith is 60 y.o. female admitted with above HPI and diagnosis. Patient is currently limited by functional impairments below (see PT problem list). Patient lives with her son and per chart review has been bedbound since December 2021 admission due to [place]. Evaluation limited by pain and pt agreeable to UE/LE strength assessment only. Patient overall with poor engagement and required repeated cues to complete Rt UE/LE testing. No muscle activation noted on Lt LE however pt able to grip therapist hand with Lt hand. Unable to reach pt's daughter to discuss short and long term mobility goals and pt declining to reposition in bed or attempt EOB activity today. Will follow patient as a trial for acute PT services to address impairments and progress independence with mobility. Recommending SNF vs LTACH follow up for mobility, nursing, and wound care. Acute PT will follow and progress as able. Of note pt still has not received low air loss mattress for pressure redistribution and will benefit from this to prevent further pressure injuries and worsening of sacral wound.     Follow Up Recommendations SNF;LTACH (for wound care)    Equipment Recommendations  None recommended by PT    Recommendations for Other Services OT consult     Precautions / Restrictions Precautions Precautions: Fall Restrictions Weight Bearing  Restrictions: No      Mobility  Bed Mobility               General bed mobility comments: pt stating "no no no" "maybe" "don't move my leg more". pt very limited by pain related to sacral wound and declined to reposition in bed. pt Rt prafo adjsuted to prevent hip external rotation in supine.    Transfers                    Ambulation/Gait                Stairs            Wheelchair Mobility    Modified Rankin (Stroke Patients Only)       Balance                                             Pertinent Vitals/Pain Pain Assessment: Faces Faces Pain Scale: Hurts even more Pain Location: sacrum, buttock Pain Descriptors / Indicators: Discomfort;Grimacing Pain Intervention(s): Limited activity within patient's tolerance;Monitored during session    Home Living Family/patient expects to be discharged to:: Skilled nursing facility (SNF vs [facility] for wound care) Living Arrangements: Children (pt's son)               Additional Comments: pt reports she has a power wheelchair, mechanical lift, and hospital bed at home    Prior Function Level of Independence: Needs assistance   Gait / Transfers Assistance Needed: per patient she has been bedbound  recently. pt not oriented to time/date and pt unable to recall last time she was ambulatory. pt reports her son assists with bed<>wheelchair transfers with mechanical lift and that she has had an electric wheelchair for several years.  ADL's / Homemaking Assistance Needed: pt requires assist from son and unsure if pt has assist from home aids.        Hand Dominance   Dominant Hand: Right    Extremity/Trunk Assessment   Upper Extremity Assessment Upper Extremity Assessment: Generalized weakness;Defer to OT evaluation;LUE deficits/detail LUE Deficits / Details: weak grip strength and hisotry of Lt hemiplegia    Lower Extremity Assessment Lower Extremity Assessment: RLE  deficits/detail;LLE deficits/detail;Generalized weakness RLE Deficits / Details: pt able to perfom quad set and ankle dorsi/plantar flexion but limted to bed level assessment. significant weakness noted throughout. LLE Deficits / Details: pt with no motor activation observed, hx of Lt hemiplegia       Communication   Communication: No difficulties  Cognition Arousal/Alertness: Awake/alert Behavior During Therapy: Flat affect Overall Cognitive Status: No family/caregiver present to determine baseline cognitive functioning Area of Impairment: Orientation                 Orientation Level: Disoriented to;Place;Time;Situation             General Comments: no family present and pt stating "June" as the answer to multiple questions: month, year, last time ambulatory.      General Comments      Exercises     Assessment/Plan    PT Assessment Patient needs continued PT services (will trial, need to discuss goals with pt/family)  PT Problem List Decreased strength;Decreased activity tolerance;Decreased balance;Decreased mobility;Decreased cognition;Decreased coordination;Decreased knowledge of precautions;Impaired sensation;Obesity;Decreased skin integrity;Pain       PT Treatment Interventions Functional mobility training;Therapeutic activities;Therapeutic exercise;Balance training;Neuromuscular re-education;Patient/family education;DME instruction;Gait training    PT Goals (Current goals can be found in the Care Plan section)  Acute Rehab PT Goals Patient Stated Goal: none stated, attempted to call pt's daughter POA but no answer. PT Goal Formulation: Patient unable to participate in goal setting Time For Goal Achievement: 07/04/20 Potential to Achieve Goals: Poor    Frequency Min 1X/week   Barriers to discharge        Co-evaluation               AM-PAC PT "6 Clicks" Mobility  Outcome Measure Help needed turning from your back to your side while in a flat  bed without using bedrails?: Total Help needed moving from lying on your back to sitting on the side of a flat bed without using bedrails?: Total Help needed moving to and from a bed to a chair (including a wheelchair)?: Total Help needed standing up from a chair using your arms (e.g., wheelchair or bedside chair)?: Total Help needed to walk in hospital room?: Total Help needed climbing 3-5 steps with a railing? : Total 6 Click Score: 6    End of Session   Activity Tolerance: Patient limited by pain Patient left: in bed;with call bell/phone within reach;with bed alarm set Nurse Communication: Mobility status PT Visit Diagnosis: Muscle weakness (generalized) (M62.81);Difficulty in walking, not elsewhere classified (R26.2)    Time: 4196-2229 PT Time Calculation (min) (ACUTE ONLY): 11 min   Charges:   PT Evaluation $PT Eval Moderate Complexity: 1 Mod          Verner Mould, DPT Acute Rehabilitation Services Office [phone] Pager [phone]    Jacques Navy 06/20/2020, 1:43  PM   

## 2020-06-20 NOTE — Progress Notes (Signed)
PHARMACY - PHYSICIAN COMMUNICATION CRITICAL VALUE ALERT - BLOOD CULTURE IDENTIFICATION (BCID)  Tina Griffith is an 60 y.o. female who presented to [facility] on [facility]/16/2020 with a chief complaint of stage IV sacral decubitus ulcer with necrosis  Assessment:  1/4 anaerobic GNR, no ID  Name of physician (or Provider) Contacted: [place]  Current antibiotics: zosyn  Changes to prescribed antibiotics recommended:  none  Results for orders placed or performed during the hospital encounter of 06/16/20  Blood Culture ID Panel (Reflexed) (Collected: 06/16/2020  3:32 PM)  Result Value Ref Range   Enterococcus faecalis NOT DETECTED NOT DETECTED   Enterococcus Faecium NOT DETECTED NOT DETECTED   Listeria monocytogenes NOT DETECTED NOT DETECTED   Staphylococcus species NOT DETECTED NOT DETECTED   Staphylococcus aureus (BCID) NOT DETECTED NOT DETECTED   Staphylococcus epidermidis NOT DETECTED NOT DETECTED   Staphylococcus lugdunensis NOT DETECTED NOT DETECTED   Streptococcus species NOT DETECTED NOT DETECTED   Streptococcus agalactiae NOT DETECTED NOT DETECTED   Streptococcus pneumoniae NOT DETECTED NOT DETECTED   Streptococcus pyogenes NOT DETECTED NOT DETECTED   A.calcoaceticus-baumannii NOT DETECTED NOT DETECTED   Bacteroides fragilis NOT DETECTED NOT DETECTED   Enterobacterales NOT DETECTED NOT DETECTED   Enterobacter cloacae complex NOT DETECTED NOT DETECTED   Escherichia coli NOT DETECTED NOT DETECTED   Klebsiella aerogenes NOT DETECTED NOT DETECTED   Klebsiella oxytoca NOT DETECTED NOT DETECTED   Klebsiella pneumoniae NOT DETECTED NOT DETECTED   Proteus species NOT DETECTED NOT DETECTED   Salmonella species NOT DETECTED NOT DETECTED   Serratia marcescens NOT DETECTED NOT DETECTED   Haemophilus influenzae NOT DETECTED NOT DETECTED   Neisseria meningitidis NOT DETECTED NOT DETECTED   Pseudomonas aeruginosa NOT DETECTED NOT DETECTED   Stenotrophomonas maltophilia NOT DETECTED NOT  DETECTED   Candida albicans NOT DETECTED NOT DETECTED   Candida auris NOT DETECTED NOT DETECTED   Candida glabrata NOT DETECTED NOT DETECTED   Candida krusei NOT DETECTED NOT DETECTED   Candida parapsilosis NOT DETECTED NOT DETECTED   Candida tropicalis NOT DETECTED NOT DETECTED   Cryptococcus neoformans/gattii NOT DETECTED NOT DETECTED    Angela Adam 06/20/2020  12:42 AM

## 2020-06-21 DIAGNOSIS — R6521 Severe sepsis with septic shock: Secondary | ICD-10-CM | POA: Diagnosis not present

## 2020-06-21 DIAGNOSIS — A419 Sepsis, unspecified organism: Secondary | ICD-10-CM | POA: Diagnosis not present

## 2020-06-21 LAB — CULTURE, BLOOD (ROUTINE X 2)
Culture: NO GROWTH
Special Requests: ADEQUATE

## 2020-06-21 LAB — BASIC METABOLIC PANEL
Anion gap: 11 (ref 5–15)
BUN: 7 mg/dL (ref 6–20)
CO2: 23 mmol/L (ref 22–32)
Calcium: 8.5 mg/dL — ABNORMAL LOW (ref 8.9–10.3)
Chloride: 101 mmol/L (ref 98–111)
Creatinine, Ser: 0.61 mg/dL (ref 0.44–1.00)
GFR, Estimated: >60 mL/min (ref >60)
Glucose, Bld: 121 mg/dL — ABNORMAL HIGH (ref 70–99)
Potassium: 3.6 mmol/L (ref 3.5–5.1)
Sodium: 135 mmol/L (ref 135–145)

## 2020-06-21 LAB — CBC WITH DIFFERENTIAL/PLATELET
Abs Immature Granulocytes: 0.21 10*3/uL — ABNORMAL HIGH (ref 0.00–0.07)
Basophils Absolute: 0.1 10*3/uL (ref 0.0–0.1)
Basophils Relative: 0 %
Eosinophils Absolute: 0.1 10*3/uL (ref 0.0–0.5)
Eosinophils Relative: 1 %
HCT: 27 % — ABNORMAL LOW (ref 36.0–46.0)
Hemoglobin: 8.6 g/dL — ABNORMAL LOW (ref 12.0–15.0)
Immature Granulocytes: 2 %
Lymphocytes Relative: 11 %
Lymphs Abs: 1.5 10*3/uL (ref 0.7–4.0)
MCH: 26 pg (ref 26.0–34.0)
MCHC: 31.9 g/dL (ref 30.0–36.0)
MCV: 81.6 fL (ref 80.0–100.0)
Monocytes Absolute: 0.8 10*3/uL (ref 0.1–1.0)
Monocytes Relative: 6 %
Neutro Abs: 10.6 10*3/uL — ABNORMAL HIGH (ref 1.7–7.7)
Neutrophils Relative %: 80 %
Platelets: 427 10*3/uL — ABNORMAL HIGH (ref 150–400)
RBC: 3.31 MIL/uL — ABNORMAL LOW (ref 3.87–5.11)
RDW: 19.9 % — ABNORMAL HIGH (ref 11.5–15.5)
WBC: 13.2 10*3/uL — ABNORMAL HIGH (ref 4.0–10.5)
nRBC: 0 % (ref 0.0–0.2)

## 2020-06-21 LAB — C-REACTIVE PROTEIN: CRP: 13.9 mg/dL — ABNORMAL HIGH (ref <1.0)

## 2020-06-21 LAB — GLUCOSE, CAPILLARY
Glucose-Capillary: 124 mg/dL — ABNORMAL HIGH (ref 70–99)
Glucose-Capillary: 114 mg/dL — ABNORMAL HIGH (ref 70–99)
Glucose-Capillary: 112 mg/dL — ABNORMAL HIGH (ref 70–99)
Glucose-Capillary: 102 mg/dL — ABNORMAL HIGH (ref 70–99)

## 2020-06-21 LAB — MAGNESIUM: Magnesium: 1.4 mg/dL — ABNORMAL LOW (ref 1.7–2.4)

## 2020-06-21 LAB — PHOSPHORUS: Phosphorus: 2.5 mg/dL (ref 2.5–4.6)

## 2020-06-21 LAB — SEDIMENTATION RATE: Sed Rate: 92 mm/hr — ABNORMAL HIGH (ref 0–22)

## 2020-06-21 LAB — CK: Total CK: 71 U/L (ref 38–234)

## 2020-06-21 NOTE — Progress Notes (Signed)
PROGRESS NOTE  Tina Griffith  DOB: 1960/08/12  PCP: Elizabeth [id number]  DOA: 06/16/2020  LOS: 5 days   Chief Complaint  Patient presents with  . Wound Infection  . Fatigue   Brief narrative: Tina Griffith is a 60 y.o. female with PMH significant for multiple sclerosis, movement disorder, hypertension, constipation and COVID-19 pneumonia in December 2021.  At baseline, she was using a walker and wheelchair up until [place] admission.  Since then she has remained bedbound at home and has developed sacral decubitus ulcers. 1/24, patient was brought to the ED with complaint of failure to thrive, worsening of gluteal/sacral pressure sores.  Since last hospitalization 4 weeks ago for Covid pneumonia, patient has had anorexia, weight loss, generalized fatigue and worsening lower extremity edema.  Patient was admitted for sepsis secondary to infected sacral decubitus ulcer. 1/27, patient underwent debridement of sacral decubitus ulcer by Dr. Harlow Asa.   Subjective: Patient was seen and examined this morning.   Lying on bed.  Not in distress.  Pain controlled.  No family at bedside.   No new symptoms.  Assessment/Plan: Sepsis secondary to infected stage IV sacral decubitus ulcer -1/27, patient underwent debridement of sacral decubitus ulcer by Dr. Harlow Asa.  -Blood cultures did not show any growth so far. -Currently on IV ertapenem and IV daptomycin per infectious disease. -WBC count and lactic acid level improving. -Continue IV fluids. Recent Labs  Lab 06/16/20 1532 06/16/20 1850 06/16/20 2101 06/16/20 2300 06/16/20 2322 06/17/20 0230 06/17/20 0436 06/18/20 0720 06/19/20 0823 06/20/20 0546 06/21/20 1048  WBC 23.5*  --   --   --    < >  --  18.1* 14.9* 18.1* 14.4* 13.2*  LATICACIDVEN 1.6 4.1*  --  1.8  --  2.9*  --  1.1  --   --   --   PROCALCITON  --   --  1.98  --   --   --   --   --   --   --   --    < > = values in this interval not displayed.   E. coli  UTI -More than 100,000 CFU per mL of E. Coli. -Continue IV antibiotics  Multiple sclerosis -Continue Home MS  meds -baclofen dalfampridine, ocrelizumab  Pain management -Currently on tramadol as needed, OxyIR as needed, Dilaudid as needed. -Minimize use of pain medicines and other mood altering medications.  -Chronic pain and other CNS meds- percocet, baclofen, norco, celexa, gabapentin, naloxone, ultram, MSIR 30 bid.  It is unclear if patient was taking all of them -Minimize the use of mood altering medications as much as possible.   -Currently on OxyIR 5 mg 3 times daily as needed. -Resume baclofen, dalfampridine and ocrelizumab for MS  Essential hypertension -Continue metoprolol.  Keep HCTZ on hold.    Diabetes mellitus -A1c 6.5 on 1/26 -Home meds include Victoza 0.6 mg daily. -Currently on sliding scale insulin with Accu-Cheks. Recent Labs  Lab 06/20/20 1147 06/20/20 1626 06/20/20 2220 06/21/20 0751 06/21/20 1147  GLUCAP 105* 114* 107* 124* 114*   Elevated troponin -Troponin was initially elevated to 112, trended down. -Currently not having anginal symptoms. -Echocardiogram with EF 60 to 65%, mild LVH. -Continue aspirin and statin Recent Labs    06/21/20 1048  CKTOTAL 71   Hypokalemia/hypomagnesemia/hypophosphatemia -Daily monitoring and replacement.  -Labs this morning with magnesium level low at 1.4.  Ordered replacement.   -Repeat tomorrow. Recent Labs  Lab 06/16/20 1640 06/17/20 0436 06/18/20 0720 06/19/20 [id number] 06/20/20  1572 06/21/20 1048  K  --  2.8* 2.7* 3.7 4.1 3.6  MG 1.6* 2.1 1.4* 1.6*  --  1.4*  PHOS  --   --  1.8* 2.5 2.3* 2.5   Hx of IBS  -on trulance and PPI  Melena Anemia of chronic disease -hemoglobin down from 11.1 on admission to 8.6 today.  -Patient had melanotic stool while in the OR. -Currently on IV Protonix.  Continue to monitor. Recent Labs    06/17/20 0436 06/18/20 0720 06/19/20 0823 06/19/20 0826 06/20/20 0546  06/21/20 1048  HGB 8.6* 8.6* 8.4*  --  8.5* 8.6*  MCV 79.5* 80.7 79.8*  --  83.3 81.6  VITAMINB12  --   --   --  317  --   --   FOLATE  --   --   --  17.2  --   --   FERRITIN  --   --   --  349*  --   --   TIBC  --   --   --  141*  --   --   IRON  --   --   --  20*  --   --   RETICCTPCT  --   --   --  1.8  --   --    Mobility: Pending PT eval Code Status:   Code Status: Full Code  Nutritional status: Body mass index is 30.02 kg/m. Nutrition Problem: Moderate Malnutrition Etiology: chronic illness,wound healing (MS) Signs/Symptoms: energy intake < or equal to 75% for > or equal to 1 month,moderate fat depletion,mild muscle depletion Diet Order            Diet Carb Modified Fluid consistency: Thin; Room service appropriate? Yes  Diet effective now                 DVT prophylaxis: enoxaparin (LOVENOX) injection 40 mg Start: 06/20/20 0800 SCD's Start: 06/19/20 1520 SCDs Start: 06/16/20 2323   Antimicrobials:  IV ertapenem and IV daptomycin Fluid: Normal saline at 75 mill per hour Consultants: General surgery Family Communication:  Called and updated patient's daughter yesterday 1/28  Status is: Inpatient  Remains inpatient appropriate because: Underwent surgical debridement yesterday.  Deep wound.  SNF versus LTAC  Dispo: The patient is from: Home              Anticipated d/c is to: Pending PT eval, SNF versus LTAC likely              Anticipated d/c date is: 3 days              Patient currently is not medically stable to d/c.   Difficult to place patient No     Infusions:  . sodium chloride 75 mL/hr at 06/20/20 2059  . DAPTOmycin (CUBICIN)  IV 650 mg (06/20/20 2102)  . ertapenem 1,000 mg (06/20/20 2352)    Scheduled Meds: . (feeding supplement) PROSource Plus  30 mL Oral TID WC  . aspirin EC  81 mg Oral Daily  . benzonatate  100 mg Oral TID  . Chlorhexidine Gluconate Cloth  6 each Topical Daily  . Chlorhexidine Gluconate Cloth  6 each Topical Q0600  .  dalfampridine  10 mg Oral BID  . enoxaparin (LOVENOX) injection  40 mg Subcutaneous Q24H  . ferrous sulfate  325 mg Oral BID  . fluticasone furoate-vilanterol  1 puff Inhalation Daily  . insulin aspart  0-5 Units Subcutaneous QHS  . insulin aspart  0-9 Units  Subcutaneous TID WC  . metoprolol succinate  100 mg Oral Daily  . multivitamin with minerals  1 tablet Oral Daily  . mupirocin ointment  1 application Nasal BID  . oxybutynin  10 mg Oral Daily  . pantoprazole  40 mg Oral Daily  . Plecanatide  1 tablet Oral Daily  . pravastatin  20 mg Oral q1800  . tiZANidine  8 mg Oral TID    Antimicrobials: Anti-infectives (From admission, onward)   Start     Dose/Rate Route Frequency Ordered Stop   06/20/20 2000  DAPTOmycin (CUBICIN) 650 mg in sodium chloride 0.9 % IVPB        650 mg 226 mL/hr over 30 Minutes Intravenous Daily 06/20/20 1320     06/20/20 1800  ertapenem (INVANZ) 1,000 mg in sodium chloride 0.9 % 100 mL IVPB        1 g 200 mL/hr over 30 Minutes Intravenous Every 24 hours 06/20/20 1402     06/19/20 2000  doxycycline (VIBRA-TABS) tablet 100 mg  Status:  Discontinued        100 mg Oral 2 times per day 06/19/20 1324 06/20/20 1320   06/17/20 1200  vancomycin (VANCOREADY) IVPB 750 mg/150 mL  Status:  Discontinued        750 mg 150 mL/hr over 60 Minutes Intravenous Every 12 hours 06/16/20 2115 06/19/20 1322   06/17/20 0015  piperacillin-tazobactam (ZOSYN) IVPB 3.375 g  Status:  Discontinued        3.375 g 12.5 mL/hr over 240 Minutes Intravenous Every 8 hours 06/17/20 0004 06/20/20 1402   06/17/20 0000  piperacillin-tazobactam (ZOSYN) IVPB 3.375 g  Status:  Discontinued        3.375 g 100 mL/hr over 30 Minutes Intravenous Every 6 hours 06/16/20 2329 06/17/20 0003   06/16/20 2200  cefTRIAXone (ROCEPHIN) 2 g in sodium chloride 0.9 % 100 mL IVPB  Status:  Discontinued        2 g 200 mL/hr over 30 Minutes Intravenous Every 24 hours 06/16/20 2101 06/16/20 2329   06/16/20 2115   vancomycin (VANCOREADY) IVPB 1500 mg/300 mL        1,500 mg 150 mL/hr over 120 Minutes Intravenous  Once 06/16/20 2106 06/17/20 0235   06/16/20 1545  piperacillin-tazobactam (ZOSYN) IVPB 3.375 g        3.375 g 100 mL/hr over 30 Minutes Intravenous  Once 06/16/20 1533 06/16/20 1633      PRN meds: acetaminophen **OR** acetaminophen, docusate sodium, liver oil-zinc oxide, ondansetron **OR** ondansetron (ZOFRAN) IV, oxyCODONE, polyethylene glycol, sodium chloride flush   Objective: Vitals:   06/21/20 0601 06/21/20 0929  BP: 110/61 119/67  Pulse: 83 93  Resp: 16 14  Temp: 99.2 F (37.3 C) 98.7 F (37.1 C)  SpO2: 97% 98%    Intake/Output Summary (Last 24 hours) at 06/21/2020 1301 Last data filed at 06/21/2020 0900 Gross per 24 hour  Intake 2670.29 ml  Output 3100 ml  Net -429.71 ml   Filed Weights   06/17/20 1145 06/18/20 0500 06/19/20 0844  Weight: 74.8 kg 81.8 kg 81.8 kg   Weight change:  Body mass index is 30.02 kg/m.   Physical Exam: General exam: Pleasant middle-aged African-American female.  Not in pain. Skin: No rashes, lesions or ulcers. HEENT: Atraumatic, normocephalic, no obvious bleeding Lungs: Clear to auscultation bilaterally CVS: Regular rate and rhythm, no murmur GI/Abd soft, nontender, nondistended, bowel sound present Back: Deferred to examination of wound care nurse and surgery for status of sacral ulcer CNS:  Alert, awake monitor x3 Psychiatry: Mood appropriate Extremities: No pedal edema, no calf tenderness  Data Review: I have personally reviewed the laboratory data and studies available.  Recent Labs  Lab 06/16/20 1532 06/16/20 1553 06/17/20 0436 06/18/20 0720 06/19/20 0823 06/20/20 0546 06/21/20 1048  WBC 23.5*   < > 18.1* 14.9* 18.1* 14.4* 13.2*  NEUTROABS 18.9*  --   --   --  14.3* 11.6* 10.6*  HGB 11.1*   < > 8.6* 8.6* 8.4* 8.5* 8.6*  HCT 34.5*   < > 26.3* 26.7* 25.7* 26.9* 27.0*  MCV 79.9*   < > 79.5* 80.7 79.8* 83.3 81.6  PLT 459*    < > 401* 413* 408* 391 427*   < > = values in this interval not displayed.   Recent Labs  Lab 06/16/20 1640 06/16/20 2322 06/17/20 0436 06/18/20 0720 06/19/20 0823 06/20/20 0546 06/21/20 1048  NA  --   --  136 137 136 135 135  K  --   --  2.8* 2.7* 3.7 4.1 3.6  CL  --   --  100 100 101 102 101  CO2  --   --  24 25 24 23 23   GLUCOSE  --   --  103* 122* 120* 100* 121*  BUN  --   --  21* 10 9 8 7   CREATININE  --    < > 0.73 0.72 0.57 0.57 0.61  CALCIUM  --   --  8.5* 8.4* 8.4* 8.4* 8.5*  MG 1.6*  --  2.1 1.4* 1.6*  --  1.4*  PHOS  --   --   --  1.8* 2.5 2.3* 2.5   < > = values in this interval not displayed.    F/u labs ordered  Signed, Terrilee Croak, MD [facility] 06/21/2020

## 2020-06-22 DIAGNOSIS — L89154 Pressure ulcer of sacral region, stage 4: Secondary | ICD-10-CM | POA: Diagnosis not present

## 2020-06-22 DIAGNOSIS — R6521 Severe sepsis with septic shock: Secondary | ICD-10-CM | POA: Diagnosis not present

## 2020-06-22 DIAGNOSIS — A419 Sepsis, unspecified organism: Secondary | ICD-10-CM | POA: Diagnosis not present

## 2020-06-22 LAB — GLUCOSE, CAPILLARY
Glucose-Capillary: 98 mg/dL (ref 70–99)
Glucose-Capillary: 112 mg/dL — ABNORMAL HIGH (ref 70–99)
Glucose-Capillary: 116 mg/dL — ABNORMAL HIGH (ref 70–99)
Glucose-Capillary: 130 mg/dL — ABNORMAL HIGH (ref 70–99)

## 2020-06-22 LAB — CBC WITH DIFFERENTIAL/PLATELET
Abs Immature Granulocytes: 0.18 10*3/uL — ABNORMAL HIGH (ref 0.00–0.07)
Basophils Absolute: 0.1 10*3/uL (ref 0.0–0.1)
Basophils Relative: 1 %
Eosinophils Absolute: 0.1 10*3/uL (ref 0.0–0.5)
Eosinophils Relative: 1 %
HCT: 28.5 % — ABNORMAL LOW (ref 36.0–46.0)
Hemoglobin: 9.3 g/dL — ABNORMAL LOW (ref 12.0–15.0)
Immature Granulocytes: 2 %
Lymphocytes Relative: 14 %
Lymphs Abs: 1.6 10*3/uL (ref 0.7–4.0)
MCH: 26.3 pg (ref 26.0–34.0)
MCHC: 32.6 g/dL (ref 30.0–36.0)
MCV: 80.5 fL (ref 80.0–100.0)
Monocytes Absolute: 0.8 10*3/uL (ref 0.1–1.0)
Monocytes Relative: 7 %
Neutro Abs: 8.8 10*3/uL — ABNORMAL HIGH (ref 1.7–7.7)
Neutrophils Relative %: 75 %
Platelets: 457 10*3/uL — ABNORMAL HIGH (ref 150–400)
RBC: 3.54 MIL/uL — ABNORMAL LOW (ref 3.87–5.11)
RDW: 19.6 % — ABNORMAL HIGH (ref 11.5–15.5)
WBC: 11.5 10*3/uL — ABNORMAL HIGH (ref 4.0–10.5)
nRBC: 0 % (ref 0.0–0.2)

## 2020-06-22 LAB — BASIC METABOLIC PANEL
Anion gap: 12 (ref 5–15)
BUN: 8 mg/dL (ref 6–20)
CO2: 24 mmol/L (ref 22–32)
Calcium: 8.6 mg/dL — ABNORMAL LOW (ref 8.9–10.3)
Chloride: 100 mmol/L (ref 98–111)
Creatinine, Ser: 0.54 mg/dL (ref 0.44–1.00)
GFR, Estimated: >60 mL/min (ref >60)
Glucose, Bld: 89 mg/dL (ref 70–99)
Potassium: 3.7 mmol/L (ref 3.5–5.1)
Sodium: 136 mmol/L (ref 135–145)

## 2020-06-22 LAB — MAGNESIUM: Magnesium: 1.4 mg/dL — ABNORMAL LOW (ref 1.7–2.4)

## 2020-06-22 LAB — PHOSPHORUS: Phosphorus: 2.1 mg/dL — ABNORMAL LOW (ref 2.5–4.6)

## 2020-06-22 MED ORDER — DOXYCYCLINE HYCLATE 100 MG PO TABS
100.0000 mg | ORAL_TABLET | Freq: Two times a day (BID) | ORAL | Status: DC
  Administered 2020-06-22 – 2020-06-26 (×8): 100 mg via ORAL
  Filled 2020-06-22 (×8): qty 1

## 2020-06-22 MED ORDER — MAGNESIUM SULFATE 4 GM/100ML IV SOLN
4.0000 g | Freq: Once | INTRAVENOUS | Status: AC
  Administered 2020-06-22: 4 g via INTRAVENOUS
  Filled 2020-06-22: qty 100

## 2020-06-22 MED ORDER — AMOXICILLIN-POT CLAVULANATE 875-125 MG PO TABS
1.0000 | ORAL_TABLET | Freq: Two times a day (BID) | ORAL | Status: DC
  Administered 2020-06-22 – 2020-06-26 (×8): 1 via ORAL
  Filled 2020-06-22 (×8): qty 1

## 2020-06-22 MED ORDER — POTASSIUM PHOSPHATES 15 MMOLE/5ML IV SOLN
30.0000 mmol | Freq: Once | INTRAVENOUS | Status: AC
  Administered 2020-06-22: 30 mmol via INTRAVENOUS
  Filled 2020-06-22: qty 10

## 2020-06-22 NOTE — Progress Notes (Signed)
PROGRESS NOTE  Lucky Cowboy  DOB: [birth year]  PCP: [facility] YQI:[id number]  DOA: 06/16/2020  LOS: 6 days   Chief Complaint  Patient presents with  . Wound Infection  . Fatigue   Brief narrative: Tina Griffith is a 60 y.o. female with PMH significant for multiple sclerosis, movement disorder, hypertension, constipation and COVID-19 pneumonia in December 2021.  At baseline, she was using a walker and wheelchair up until [place] admission.  Since then she has remained bedbound at home and has developed sacral decubitus ulcers. 1/24, patient was brought to the ED with complaint of failure to thrive, worsening of gluteal/sacral pressure sores.  Since last hospitalization 4 weeks ago for Covid pneumonia, patient has had anorexia, weight loss, generalized fatigue and worsening lower extremity edema.  Patient was admitted for sepsis secondary to infected sacral decubitus ulcer. 1/27, patient underwent debridement of sacral decubitus ulcer by Dr. Harlow Asa.   Subjective: Patient was seen and examined this morning.   Lying on bed.  Not in distress.  Pain controlled.  No family at bedside.   No new symptoms. Labs pending.  Assessment/Plan: Sepsis secondary to infected stage IV sacral decubitus ulcer Blood culture positive for Bacteroides ovatus -1/27, patient underwent debridement of sacral decubitus ulcer by Dr. Harlow Asa.  Dressing being changed by nursing staff.  Per RN today, wound seems to be healing and has granulation tissue. -One of the 2 blood cultures sent on 1/24 grew Bacteroides ovatus.  Repeat blood culture from 1/28 have not shown any growth so far. -Infectious disease following. -Currently on IV ertapenem and IV daptomycin per infectious disease. -WBC count and lactic acid level improving. -Continue IV fluids.  May need PICC line for long-term antibiotics. Recent Labs  Lab 06/16/20 1532 06/16/20 1850 06/16/20 2101 06/16/20 2300 06/16/20 2322 06/17/20 0230  06/17/20 0436 06/18/20 0720 06/19/20 0823 06/20/20 0546 06/21/20 1048  WBC 23.5*  --   --   --    < >  --  18.1* 14.9* 18.1* 14.4* 13.2*  LATICACIDVEN 1.6 4.1*  --  1.8  --  2.9*  --  1.1  --   --   --   PROCALCITON  --   --  1.98  --   --   --   --   --   --   --   --    < > = values in this interval not displayed.   E. coli UTI -More than 100,000 CFU per mL of E. Coli. -Continue IV antibiotics  Multiple sclerosis -Continue Home MS  meds -baclofen dalfampridine, ocrelizumab  Pain management -Currently on tramadol as needed, OxyIR as needed, Dilaudid as needed. -Minimize use of pain medicines and other mood altering medications. -Currently on OxyIR 5 mg 3 times daily as needed.  Essential hypertension -Continue metoprolol.  Keep HCTZ on hold.    Diabetes mellitus -A1c 6.5 on 1/26 -Home meds include Victoza 0.6 mg daily. -Currently on sliding scale insulin with Accu-Cheks. Recent Labs  Lab 06/21/20 0751 06/21/20 1147 06/21/20 1624 06/21/20 2229 06/22/20 0747  GLUCAP 124* 114* 112* 102* 98   Elevated troponin -Troponin was initially elevated to 112, trended down. -Currently not having anginal symptoms. -Echocardiogram with EF 60 to 65%, mild LVH. -Continue aspirin and statin Recent Labs    06/21/20 1048  CKTOTAL 71   Hypokalemia/hypomagnesemia/hypophosphatemia -Daily monitoring and replacement.  -Magnesium replaced yesterday.  Labs pending today..   Recent Labs  Lab 06/16/20 1640 06/17/20 3875 06/18/20 0720 06/19/20 6433 06/20/20 2951  06/21/20 1048  K  --  2.8* 2.7* 3.7 4.1 3.6  MG 1.6* 2.1 1.4* 1.6*  --  1.4*  PHOS  --   --  1.8* 2.5 2.3* 2.5   Hx of IBS  -on trulance and PPI  Melena Anemia of chronic disease -hemoglobin down from 11.1 on admission to 8.6 today.  -Patient had melanotic stool while in the OR. -Currently on IV Protonix.  Continue to monitor. Recent Labs    06/17/20 0436 06/18/20 0720 06/19/20 0823 06/19/20 0826 06/20/20 0546  06/21/20 1048  HGB 8.6* 8.6* 8.4*  --  8.5* 8.6*  MCV 79.5* 80.7 79.8*  --  83.3 81.6  VITAMINB12  --   --   --  317  --   --   FOLATE  --   --   --  17.2  --   --   FERRITIN  --   --   --  349*  --   --   TIBC  --   --   --  141*  --   --   IRON  --   --   --  20*  --   --   RETICCTPCT  --   --   --  1.8  --   --    Mobility: PT eval obtained.  SNF recommended. Code Status:   Code Status: Full Code  Nutritional status: Body mass index is 30.02 kg/m. Nutrition Problem: Moderate Malnutrition Etiology: chronic illness,wound healing (MS) Signs/Symptoms: energy intake < or equal to 75% for > or equal to 1 month,moderate fat depletion,mild muscle depletion Diet Order            Diet Carb Modified Fluid consistency: Thin; Room service appropriate? Yes  Diet effective now                 DVT prophylaxis: enoxaparin (LOVENOX) injection 40 mg Start: 06/20/20 0800 SCD's Start: 06/19/20 1520 SCDs Start: 06/16/20 2323   Antimicrobials:  IV ertapenem and IV daptomycin Fluid: Normal saline at 75 mill per hour Consultants: General surgery Family Communication:  Called and updated patient's daughter yesterday 1/28  Status is: Inpatient  Remains inpatient appropriate because: Underwent surgical debridement yesterday.  Deep wound.  SNF versus LTAC  Dispo: The patient is from: Home              Anticipated d/c is to: Pending PT eval, SNF versus LTAC likely              Anticipated d/c date is: 3 days              Patient currently is not medically stable to d/c.   Difficult to place patient No     Infusions:  . sodium chloride 75 mL/hr at 06/22/20 0600  . DAPTOmycin (CUBICIN)  IV Stopped (06/21/20 2038)  . ertapenem Stopped (06/21/20 1800)    Scheduled Meds: . (feeding supplement) PROSource Plus  30 mL Oral TID WC  . aspirin EC  81 mg Oral Daily  . benzonatate  100 mg Oral TID  . Chlorhexidine Gluconate Cloth  6 each Topical Daily  . Chlorhexidine Gluconate Cloth  6 each  Topical Q0600  . dalfampridine  10 mg Oral BID  . enoxaparin (LOVENOX) injection  40 mg Subcutaneous Q24H  . ferrous sulfate  325 mg Oral BID  . fluticasone furoate-vilanterol  1 puff Inhalation Daily  . insulin aspart  0-5 Units Subcutaneous QHS  . insulin aspart  0-9 Units  Subcutaneous TID WC  . metoprolol succinate  100 mg Oral Daily  . multivitamin with minerals  1 tablet Oral Daily  . mupirocin ointment  1 application Nasal BID  . oxybutynin  10 mg Oral Daily  . pantoprazole  40 mg Oral Daily  . Plecanatide  1 tablet Oral Daily  . pravastatin  20 mg Oral q1800  . tiZANidine  8 mg Oral TID    Antimicrobials: Anti-infectives (From admission, onward)   Start     Dose/Rate Route Frequency Ordered Stop   06/20/20 2000  DAPTOmycin (CUBICIN) 650 mg in sodium chloride 0.9 % IVPB        650 mg 226 mL/hr over 30 Minutes Intravenous Daily 06/20/20 1320     06/20/20 1800  ertapenem (INVANZ) 1,000 mg in sodium chloride 0.9 % 100 mL IVPB        1 g 200 mL/hr over 30 Minutes Intravenous Every 24 hours 06/20/20 1402     06/19/20 2000  doxycycline (VIBRA-TABS) tablet 100 mg  Status:  Discontinued        100 mg Oral 2 times per day 06/19/20 1324 06/20/20 1320   06/17/20 1200  vancomycin (VANCOREADY) IVPB 750 mg/150 mL  Status:  Discontinued        750 mg 150 mL/hr over 60 Minutes Intravenous Every 12 hours 06/16/20 2115 06/19/20 1322   06/17/20 0015  piperacillin-tazobactam (ZOSYN) IVPB 3.375 g  Status:  Discontinued        3.375 g 12.5 mL/hr over 240 Minutes Intravenous Every 8 hours 06/17/20 0004 06/20/20 1402   06/17/20 0000  piperacillin-tazobactam (ZOSYN) IVPB 3.375 g  Status:  Discontinued        3.375 g 100 mL/hr over 30 Minutes Intravenous Every 6 hours 06/16/20 2329 06/17/20 0003   06/16/20 2200  cefTRIAXone (ROCEPHIN) 2 g in sodium chloride 0.9 % 100 mL IVPB  Status:  Discontinued        2 g 200 mL/hr over 30 Minutes Intravenous Every 24 hours 06/16/20 2101 06/16/20 2329    06/16/20 2115  vancomycin (VANCOREADY) IVPB 1500 mg/300 mL        1,500 mg 150 mL/hr over 120 Minutes Intravenous  Once 06/16/20 2106 06/17/20 0235   06/16/20 1545  piperacillin-tazobactam (ZOSYN) IVPB 3.375 g        3.375 g 100 mL/hr over 30 Minutes Intravenous  Once 06/16/20 1533 06/16/20 1633      PRN meds: acetaminophen **OR** acetaminophen, docusate sodium, liver oil-zinc oxide, ondansetron **OR** ondansetron (ZOFRAN) IV, oxyCODONE, polyethylene glycol, sodium chloride flush   Objective: Vitals:   06/22/20 0148 06/22/20 0601  BP: 126/62 136/69  Pulse: 90 99  Resp: 17 16  Temp: 99.4 F (37.4 C) 99.1 F (37.3 C)  SpO2: 92% 97%    Intake/Output Summary (Last 24 hours) at 06/22/2020 1059 Last data filed at 06/22/2020 1000 Gross per 24 hour  Intake 1353.05 ml  Output 2900 ml  Net -1546.95 ml   Filed Weights   06/17/20 1145 06/18/20 0500 06/19/20 0844  Weight: 74.8 kg 81.8 kg 81.8 kg   Weight change:  Body mass index is 30.02 kg/m.   Physical Exam: General exam: Pleasant middle-aged African-American female.  Not in distress. Skin: No rashes, lesions or ulcers. HEENT: Atraumatic, normocephalic, no obvious bleeding Lungs: Clear to auscultation bilaterally CVS: Regular rate and rhythm, no murmur GI/Abd soft, nontender, nondistended, bowel sound present Back: Deferred to examination of wound care nurse and surgery for status of sacral ulcer CNS:  Alert, awake monitor x3 Psychiatry: Mood appropriate Extremities: No pedal edema, no calf tenderness  Data Review: I have personally reviewed the laboratory data and studies available.  Recent Labs  Lab 06/16/20 1532 06/16/20 1553 06/17/20 0436 06/18/20 0720 06/19/20 0823 06/20/20 0546 06/21/20 1048  WBC 23.5*   < > 18.1* 14.9* 18.1* 14.4* 13.2*  NEUTROABS 18.9*  --   --   --  14.3* 11.6* 10.6*  HGB 11.1*   < > 8.6* 8.6* 8.4* 8.5* 8.6*  HCT 34.5*   < > 26.3* 26.7* 25.7* 26.9* 27.0*  MCV 79.9*   < > 79.5* 80.7 79.8*  83.3 81.6  PLT 459*   < > 401* 413* 408* 391 427*   < > = values in this interval not displayed.   Recent Labs  Lab 06/16/20 1640 06/16/20 2322 06/17/20 0436 06/18/20 0720 06/19/20 0823 06/20/20 0546 06/21/20 1048  NA  --   --  136 137 136 135 135  K  --   --  2.8* 2.7* 3.7 4.1 3.6  CL  --   --  100 100 101 102 101  CO2  --   --  24 25 24 23 23   GLUCOSE  --   --  103* 122* 120* 100* 121*  BUN  --   --  21* 10 9 8 7   CREATININE  --    < > 0.73 0.72 0.57 0.57 0.61  CALCIUM  --   --  8.5* 8.4* 8.4* 8.4* 8.5*  MG 1.6*  --  2.1 1.4* 1.6*  --  1.4*  PHOS  --   --   --  1.8* 2.5 2.3* 2.5   < > = values in this interval not displayed.    F/u labs ordered  Signed, Terrilee Croak, MD [facility] 06/22/2020

## 2020-06-22 NOTE — Progress Notes (Signed)
[facility] for Infectious Disease  Date of Admission:  06/16/2020       Abx: Dapto/ertapenem from 1/28-c 1/25-28 vanc/piptazo  ASSESSMENT: Sepsis Stage IV sacral decub fever Bacteriuria Bacteroides species bsi  Sacral ulcer s/p I&D 1/27 to remove necrotic skin/fascia//muscle  Suspect bacteroides bsi source is the sacral ulcer translocation Ct abd/pelv no definitive evidence osseous involvement. Even if imaging evidence suggest, high chance it might not be om (dx via biopsy and bone cx).   As she has no reversible pathogenesis to address her decub ulcer, I would focus aggressively on wound care/nutrition.  Literature suggest even if OM present in someone with her morbidity/no reversible pathogenesis of ulcer formation (and no surgical option to close ulcer), it is best to treat for short course 10-14 days of abx for presumed SSI and related complication only   As such, will transition her to doxy/augmentin to finish this 14 day course  Of note, suspect ecoli in urine is assymptomatic bacteriuria. augmentin won't cover but unlikely an issue.    PLAN: 1. Stop dapto/ertapenem 2. Start doxy 100 mg po bid and augmentin 875 mg po bid for another 9 days until 07/01/2020 3. Id will sign off  Principal Problem:   Septic shock ([facility]) Active Problems:   Multiple sclerosis (HCC)   Chronic kidney disease   Decubital ulcer   Essential (primary) hypertension   Hypomagnesemia   Anterior optic neuritis   Hemiplegia, spastic (HCC)   Leucocytosis   UTI (urinary tract infection)   Wound infection   Malnutrition of moderate degree   Scheduled Meds: . (feeding supplement) PROSource Plus  30 mL Oral TID WC  . aspirin EC  81 mg Oral Daily  . benzonatate  100 mg Oral TID  . Chlorhexidine Gluconate Cloth  6 each Topical Daily  . Chlorhexidine Gluconate Cloth  6 each Topical Q0600  . dalfampridine  10 mg Oral BID  . enoxaparin (LOVENOX) injection  40 mg Subcutaneous Q24H   . ferrous sulfate  325 mg Oral BID  . fluticasone furoate-vilanterol  1 puff Inhalation Daily  . insulin aspart  0-5 Units Subcutaneous QHS  . insulin aspart  0-9 Units Subcutaneous TID WC  . metoprolol succinate  100 mg Oral Daily  . multivitamin with minerals  1 tablet Oral Daily  . mupirocin ointment  1 application Nasal BID  . oxybutynin  10 mg Oral Daily  . pantoprazole  40 mg Oral Daily  . Plecanatide  1 tablet Oral Daily  . pravastatin  20 mg Oral q1800  . tiZANidine  8 mg Oral TID   Continuous Infusions: . sodium chloride 75 mL/hr at 06/22/20 0600  . DAPTOmycin (CUBICIN)  IV Stopped (06/21/20 2038)  . ertapenem Stopped (06/21/20 1800)  . magnesium sulfate bolus IVPB 4 g (06/22/20 1400)  . potassium PHOSPHATE IVPB (in mmol)     PRN Meds:.acetaminophen **OR** acetaminophen, docusate sodium, liver oil-zinc oxide, ondansetron **OR** ondansetron (ZOFRAN) IV, oxyCODONE, polyethylene glycol, sodium chloride flush   SUBJECTIVE: Tolerating abx No f/c/rash/n/v/diarrhea Pain controlled   Review of Systems: ROS Other ros negative  Allergies  Allergen Reactions  . No Known Allergies     OBJECTIVE: Vitals:   06/21/20 2224 06/22/20 0148 06/22/20 0601 06/22/20 1334  BP: (!) 109/52 126/62 136/69 128/66  Pulse: 86 90 99 97  Resp: 17 17 16 18   Temp: 100 F (37.8 C) 99.4 F (37.4 C) 99.1 F (37.3 C) 99.5 F (37.5 C)  TempSrc: Oral Oral Oral Oral  SpO2: 93% 92% 97% 98%  Weight:      Height:       Body mass index is 30.02 kg/m.  Physical Exam No distress, pleasant Heent: atraumatic, conj clear; eomi cv rrr no mrg Lungs clear abd s/nt Back: deferred  Psych alert/oriented  Lab Results Lab Results  Component Value Date   WBC 11.5 (H) 06/22/2020   HGB 9.3 (L) 06/22/2020   HCT 28.5 (L) 06/22/2020   MCV 80.5 06/22/2020   PLT 457 (H) 06/22/2020    Lab Results  Component Value Date   CREATININE 0.54 06/22/2020   BUN 8 06/22/2020   NA 136 06/22/2020   K 3.7  06/22/2020   CL 100 06/22/2020   CO2 24 06/22/2020    Lab Results  Component Value Date   ALT 16 06/18/2020   AST 18 06/18/2020   ALKPHOS 86 06/18/2020   BILITOT 0.5 06/18/2020     Microbiology: Recent Results (from the past 240 hour(s))  Blood Culture (routine x 2)     Status: Abnormal   Collection Time: 06/16/20  3:32 PM   Specimen: BLOOD  Result Value Ref Range Status   Specimen Description   Final    BLOOD RIGHT ANTECUBITAL Performed at [facility], [place] [address], [place], [place] [address]    Special Requests   Final    BOTTLES DRAWN AEROBIC AND ANAEROBIC Blood Culture adequate volume Performed at [facility] [address], [place], [place] [address]    Culture  Setup Time   Final    ANAEROBIC BOTTLE ONLY GRAM NEGATIVE RODS Organism ID to follow CRITICAL RESULT CALLED TO, READ BACK BY AND VERIFIED WITH: E JACKSON PHARMD 06/20/20 0040 JDW    Culture (A)  Final    BACTEROIDES OVATUS BETA LACTAMASE POSITIVE Performed at [facility], [place] [address], [place], [place] [address]    Report Status 06/21/2020 FINAL  Final  Blood Culture ID Panel (Reflexed)     Status: None   Collection Time: 06/16/20  3:32 PM  Result Value Ref Range Status   Enterococcus faecalis NOT DETECTED NOT DETECTED Final   Enterococcus Faecium NOT DETECTED NOT DETECTED Final   Listeria monocytogenes NOT DETECTED NOT DETECTED Final   Staphylococcus species NOT DETECTED NOT DETECTED Final   Staphylococcus aureus (BCID) NOT DETECTED NOT DETECTED Final   Staphylococcus epidermidis NOT DETECTED NOT DETECTED Final   Staphylococcus lugdunensis NOT DETECTED NOT DETECTED Final   Streptococcus species NOT DETECTED NOT DETECTED Final   Streptococcus agalactiae NOT DETECTED NOT DETECTED Final   Streptococcus pneumoniae NOT DETECTED NOT DETECTED Final   Streptococcus pyogenes NOT DETECTED NOT DETECTED Final   A.calcoaceticus-baumannii NOT DETECTED NOT DETECTED  Final   Bacteroides fragilis NOT DETECTED NOT DETECTED Final   Enterobacterales NOT DETECTED NOT DETECTED Final   Enterobacter cloacae complex NOT DETECTED NOT DETECTED Final   Escherichia coli NOT DETECTED NOT DETECTED Final   Klebsiella aerogenes NOT DETECTED NOT DETECTED Final   Klebsiella oxytoca NOT DETECTED NOT DETECTED Final   Klebsiella pneumoniae NOT DETECTED NOT DETECTED Final   Proteus species NOT DETECTED NOT DETECTED Final   Salmonella species NOT DETECTED NOT DETECTED Final   Serratia marcescens NOT DETECTED NOT DETECTED Final   Haemophilus influenzae NOT DETECTED NOT DETECTED Final   Neisseria meningitidis NOT DETECTED NOT DETECTED Final   Pseudomonas aeruginosa NOT DETECTED NOT DETECTED Final   Stenotrophomonas maltophilia NOT DETECTED NOT DETECTED Final  Candida albicans NOT DETECTED NOT DETECTED Final   Candida auris NOT DETECTED NOT DETECTED Final   Candida glabrata NOT DETECTED NOT DETECTED Final   Candida krusei NOT DETECTED NOT DETECTED Final   Candida parapsilosis NOT DETECTED NOT DETECTED Final   Candida tropicalis NOT DETECTED NOT DETECTED Final   Cryptococcus neoformans/gattii NOT DETECTED NOT DETECTED Final    Comment: Performed at [facility], [place] [address] [place], [place] [address]  Blood Culture (routine x 2)     Status: None   Collection Time: 06/16/20  4:00 PM   Specimen: BLOOD RIGHT HAND  Result Value Ref Range Status   Specimen Description   Final    BLOOD RIGHT HAND Performed at [facility] [address], [place], [place] [address]    Special Requests   Final    BOTTLES DRAWN AEROBIC AND ANAEROBIC Blood Culture results may not be optimal due to an inadequate volume of blood received in culture bottles Performed at [facility] [address] [place], [place] [address]    Culture   Final    NO GROWTH 5 DAYS Performed at [facility], [place] [address] [place], [place] [address]    Report  Status 06/21/2020 FINAL  Final  Urine culture     Status: Abnormal   Collection Time: 06/16/20  8:28 PM   Specimen: In/Out Cath Urine  Result Value Ref Range Status   Specimen Description   Final    IN/OUT CATH URINE Performed at [facility] [address] [place], [place] [address]    Special Requests   Final    NONE Performed at [facility], [place] [address] [place], [place] [address]    Culture >=100,000 COLONIES/mL ESCHERICHIA COLI (A)  Final   Report Status 06/19/2020 FINAL  Final   Organism ID, Bacteria ESCHERICHIA COLI (A)  Final      Susceptibility   Escherichia coli - MIC*    AMPICILLIN >=32 RESISTANT Resistant     CEFAZOLIN 16 SENSITIVE Sensitive     CEFEPIME <=0.12 SENSITIVE Sensitive     CEFTRIAXONE <=0.25 SENSITIVE Sensitive     CIPROFLOXACIN >=4 RESISTANT Resistant     GENTAMICIN <=1 SENSITIVE Sensitive     IMIPENEM <=0.25 SENSITIVE Sensitive     NITROFURANTOIN 32 SENSITIVE Sensitive     TRIMETH/SULFA >=320 RESISTANT Resistant     AMPICILLIN/SULBACTAM >=32 RESISTANT Resistant     PIP/TAZO <=4 SENSITIVE Sensitive     * >=100,000 COLONIES/mL ESCHERICHIA COLI  Surgical pcr screen     Status: Abnormal   Collection Time: 06/19/20  8:26 AM   Specimen: Nasal Mucosa; Nasal Swab  Result Value Ref Range Status   MRSA, PCR NEGATIVE NEGATIVE Final   Staphylococcus aureus POSITIVE (A) NEGATIVE Final    Comment: (NOTE) The Xpert SA Assay (FDA approved for NASAL specimens in patients 28 years of age and older), is one component of a comprehensive surveillance program. It is not intended to diagnose infection nor to guide or monitor treatment. Performed at [facility], [place] [address] [place], [place] [address]   Culture, blood (routine x 2)     Status: None (Preliminary result)   Collection Time: 06/20/20  8:11 AM   Specimen: BLOOD  Result Value Ref Range Status   Specimen Description   Final    BLOOD BLOOD  RIGHT FOREARM Performed at [facility] [address], [place],  [address]  Special Requests   Final    BOTTLES DRAWN AEROBIC ONLY Blood Culture adequate volume Performed at [facility] [address], [place], [place] [address]    Culture   Final    NO GROWTH 2 DAYS Performed at [facility] [address], [place], [place] [address]    Report Status PENDING  Incomplete  Culture, blood (routine x 2)     Status: None (Preliminary result)   Collection Time: 06/20/20  8:12 AM   Specimen: BLOOD RIGHT HAND  Result Value Ref Range Status   Specimen Description   Final    BLOOD RIGHT HAND Performed at [facility] [address] [place], [place] [address]    Special Requests   Final    BOTTLES DRAWN AEROBIC ONLY Blood Culture adequate volume Performed at [facility] [name], [place], [place] [address]    Culture   Final    NO GROWTH 2 DAYS Performed at [facility] [address] [place], [place] [address]    Report Status PENDING  Incomplete     Imaging: 06/16/20 abd/pelv ct Personally reviewed; no abscess or definitive osseous erosion Large sacral wound extends down to the coccyx without definitive change of acute osteomyelitis.  Large stool burden in the rectosigmoid colon concerning for fecal impaction.  Fibroid uterus.  Aortic atherosclerosis.  Jabier Mutton, [facility] for Infectious [place] [phone] pager    06/22/2020, 3:37 PM

## 2020-06-23 DIAGNOSIS — L89153 Pressure ulcer of sacral region, stage 3: Secondary | ICD-10-CM | POA: Diagnosis not present

## 2020-06-23 LAB — CBC WITH DIFFERENTIAL/PLATELET
Abs Immature Granulocytes: 0.17 10*3/uL — ABNORMAL HIGH (ref 0.00–0.07)
Basophils Absolute: 0.1 10*3/uL (ref 0.0–0.1)
Basophils Relative: 0 %
Eosinophils Absolute: 0.1 10*3/uL (ref 0.0–0.5)
Eosinophils Relative: 1 %
HCT: 24.4 % — ABNORMAL LOW (ref 36.0–46.0)
Hemoglobin: 7.9 g/dL — ABNORMAL LOW (ref 12.0–15.0)
Immature Granulocytes: 1 %
Lymphocytes Relative: 11 %
Lymphs Abs: 1.6 10*3/uL (ref 0.7–4.0)
MCH: 25.9 pg — ABNORMAL LOW (ref 26.0–34.0)
MCHC: 32.4 g/dL (ref 30.0–36.0)
MCV: 80 fL (ref 80.0–100.0)
Monocytes Absolute: 0.8 10*3/uL (ref 0.1–1.0)
Monocytes Relative: 6 %
Neutro Abs: 11.1 10*3/uL — ABNORMAL HIGH (ref 1.7–7.7)
Neutrophils Relative %: 81 %
Platelets: 507 10*3/uL — ABNORMAL HIGH (ref 150–400)
RBC: 3.05 MIL/uL — ABNORMAL LOW (ref 3.87–5.11)
RDW: 19.7 % — ABNORMAL HIGH (ref 11.5–15.5)
WBC: 13.7 10*3/uL — ABNORMAL HIGH (ref 4.0–10.5)
nRBC: 0 % (ref 0.0–0.2)

## 2020-06-23 LAB — GLUCOSE, CAPILLARY
Glucose-Capillary: 94 mg/dL (ref 70–99)
Glucose-Capillary: 117 mg/dL — ABNORMAL HIGH (ref 70–99)
Glucose-Capillary: 103 mg/dL — ABNORMAL HIGH (ref 70–99)
Glucose-Capillary: 98 mg/dL (ref 70–99)

## 2020-06-23 LAB — BASIC METABOLIC PANEL
Anion gap: 10 (ref 5–15)
BUN: 9 mg/dL (ref 6–20)
CO2: 25 mmol/L (ref 22–32)
Calcium: 8.4 mg/dL — ABNORMAL LOW (ref 8.9–10.3)
Chloride: 98 mmol/L (ref 98–111)
Creatinine, Ser: 0.57 mg/dL (ref 0.44–1.00)
GFR, Estimated: >60 mL/min (ref >60)
Glucose, Bld: 105 mg/dL — ABNORMAL HIGH (ref 70–99)
Potassium: 4 mmol/L (ref 3.5–5.1)
Sodium: 133 mmol/L — ABNORMAL LOW (ref 135–145)

## 2020-06-23 NOTE — Care Management Important Message (Signed)
Important Message  Patient Details IM Letter given to the Patient. Name: Tina Griffith MRN: [id number] Date of Birth: 08-Jan-1961   Medicare Important Message Given:  Yes     Kerin Salen 06/23/2020, 3:08 PM

## 2020-06-23 NOTE — Progress Notes (Signed)
PROGRESS NOTE  Tina Griffith  DOB: February 25, 1961  PCP: [facility] [id number]  DOA: 06/16/2020  LOS: 7 days   Chief Complaint  Patient presents with  . Wound Infection  . Fatigue   Brief narrative: Tina Griffith is a 60 y.o. female with PMH significant for multiple sclerosis, movement disorder, hypertension, constipation and COVID-19 pneumonia in December 2021.  At baseline, she was using a walker and wheelchair up until [facility] admission.  Since then she has remained bedbound at home and has developed sacral decubitus ulcers. 1/24, patient was brought to the ED with complaint of failure to thrive, worsening of gluteal/sacral pressure sores.  Since last hospitalization 4 weeks ago for Covid pneumonia, patient has had anorexia, weight loss, generalized fatigue and worsening lower extremity edema.  Patient was admitted for sepsis secondary to infected sacral decubitus ulcer. 1/27, patient underwent debridement of sacral decubitus ulcer by Dr. Harlow Asa.   Subjective: Patient was seen and examined this morning.   Lying on bed.  Not in distress.  On low-flow oxygen.  Had a temperature of 100.9 last night.  Slightly up WBC count this morning.  Assessment/Plan: Sepsis secondary to infected stage IV sacral decubitus ulcer Blood culture positive for Bacteroides ovatus -1/27, patient underwent debridement of sacral decubitus ulcer by Dr. Harlow Asa.  Dressing being changed by nursing staff.  Per RN , wound seems to be healing and has granulation tissue.  General surgery to follow-up wound care today. -One of the 2 blood cultures sent on 1/24 grew Bacteroides ovatus.  Repeat blood culture from 1/28 have not shown any growth so far. -Infectious disease consult appreciated.  Patient was initially managed on IV ertapenem and IV daptomycin.  Later switched to oral Augmentin and oral doxycycline for total of 2 weeks course to last till 07/01/2020. -Add mild fever of 100.9 last night, 1 episode.   WBC count slightly up today at 13.7.  Continue to monitor. Recent Labs  Lab 06/16/20 1532 06/16/20 1850 06/16/20 2101 06/16/20 2300 06/16/20 2322 06/17/20 0230 06/17/20 0436 06/18/20 0720 06/19/20 0823 06/20/20 0546 06/21/20 1048 06/22/20 0931 06/23/20 0607  WBC 23.5*  --   --   --    < >  --    < > 14.9* 18.1* 14.4* 13.2* 11.5* 13.7*  LATICACIDVEN 1.6 4.1*  --  1.8  --  2.9*  --  1.1  --   --   --   --   --   PROCALCITON  --   --  1.98  --   --   --   --   --   --   --   --   --   --    < > = values in this interval not displayed.   E. coli UTI -More than 100,000 CFU per mL of E. Coli. -On antibiotics  Multiple sclerosis -Continue Home MS  meds -baclofen dalfampridine, ocrelizumab  Pain management -Currently on tramadol as needed, OxyIR as needed, Dilaudid as needed. -Minimize use of pain medicines and other mood altering medications. -Currently on OxyIR 5 mg 3 times daily as needed.  Essential hypertension -Continue metoprolol.  Keep HCTZ on hold.    Diabetes mellitus -A1c 6.5 on 1/26 -Home meds include Victoza 0.6 mg daily. -Currently on sliding scale insulin with Accu-Cheks. Recent Labs  Lab 06/22/20 0747 06/22/20 1136 06/22/20 1549 06/22/20 2120 06/23/20 0755  GLUCAP 98 112* 116* 130* 94   Elevated troponin -Troponin was initially elevated to 112, trended down. -Currently not  having anginal symptoms. -Echocardiogram with EF 60 to 65%, mild LVH. -Continue aspirin and statin Recent Labs    06/21/20 1048  CKTOTAL 71   Hypokalemia/hypomagnesemia/hypophosphatemia -Daily monitoring and replacement.  -Magnesium replaced yesterday.  Labs pending today..   Recent Labs  Lab 06/17/20 0436 06/18/20 0720 06/19/20 6387 06/20/20 0546 06/21/20 1048 06/22/20 0931 06/23/20 0607  K 2.8* 2.7* 3.7 4.1 3.6 3.7 4.0  MG 2.1 1.4* 1.6*  --  1.4* 1.4*  --   PHOS  --  1.8* 2.5 2.3* 2.5 2.1*  --    Hx of IBS  -on trulance and PPI  Melena Anemia of chronic  disease -hemoglobin down from 11.1 on admission to 7.9 today.  Patient had few episodes of melena.  Iron and ferritin level normal. -Patient may be having some oozing at the open wound site but no bleeding from elsewhere. -Currently on Protonix.  Continue to monitor. Recent Labs    06/19/20 0823 06/19/20 0826 06/20/20 0546 06/21/20 1048 06/22/20 0931 06/23/20 0607  HGB 8.4*  --  8.5* 8.6* 9.3* 7.9*  MCV 79.8*  --  83.3 81.6 80.5 80.0  VITAMINB12  --  317  --   --   --   --   FOLATE  --  17.2  --   --   --   --   FERRITIN  --  349*  --   --   --   --   TIBC  --  141*  --   --   --   --   IRON  --  20*  --   --   --   --   RETICCTPCT  --  1.8  --   --   --   --    Mobility: PT eval obtained.  SNF recommended. Code Status:   Code Status: Full Code  Nutritional status: Body mass index is 30.02 kg/m. Nutrition Problem: Moderate Malnutrition Etiology: chronic illness,wound healing (MS) Signs/Symptoms: energy intake < or equal to 75% for > or equal to 1 month,moderate fat depletion,mild muscle depletion Diet Order            Diet Carb Modified Fluid consistency: Thin; Room service appropriate? Yes  Diet effective now                 DVT prophylaxis: enoxaparin (LOVENOX) injection 40 mg Start: 06/20/20 0800 SCD's Start: 06/19/20 1520 SCDs Start: 06/16/20 2323   Antimicrobials:  Oral Augmentin and oral doxycycline Fluid: None Consultants: General surgery Family Communication:  Family updated  Status is: Inpatient  Remains inpatient appropriate because: Has a deep sacral pressure ulcer.  Pending placement  Dispo: The patient is from: Home              Anticipated d/c is to: Pending PT eval, SNF versus LTAC likely              Anticipated d/c date is: 3 days              Patient currently is not medically stable to d/c.   Difficult to place patient No     Infusions:    Scheduled Meds: . (feeding supplement) PROSource Plus  30 mL Oral TID WC  .  amoxicillin-clavulanate  1 tablet Oral Q12H  . aspirin EC  81 mg Oral Daily  . benzonatate  100 mg Oral TID  . Chlorhexidine Gluconate Cloth  6 each Topical Daily  . Chlorhexidine Gluconate Cloth  6 each Topical Q0600  .  dalfampridine  10 mg Oral BID  . doxycycline  100 mg Oral Q12H  . enoxaparin (LOVENOX) injection  40 mg Subcutaneous Q24H  . ferrous sulfate  325 mg Oral BID  . fluticasone furoate-vilanterol  1 puff Inhalation Daily  . insulin aspart  0-5 Units Subcutaneous QHS  . insulin aspart  0-9 Units Subcutaneous TID WC  . metoprolol succinate  100 mg Oral Daily  . multivitamin with minerals  1 tablet Oral Daily  . mupirocin ointment  1 application Nasal BID  . oxybutynin  10 mg Oral Daily  . pantoprazole  40 mg Oral Daily  . Plecanatide  1 tablet Oral Daily  . pravastatin  20 mg Oral q1800  . tiZANidine  8 mg Oral TID    Antimicrobials: Anti-infectives (From admission, onward)   Start     Dose/Rate Route Frequency Ordered Stop   06/22/20 2200  amoxicillin-clavulanate (AUGMENTIN) 875-125 MG per tablet 1 tablet        1 tablet Oral Every 12 hours 06/22/20 1548 07/01/20 2159   06/22/20 2200  doxycycline (VIBRA-TABS) tablet 100 mg        100 mg Oral Every 12 hours 06/22/20 1548 07/01/20 2159   06/20/20 2000  DAPTOmycin (CUBICIN) 650 mg in sodium chloride 0.9 % IVPB  Status:  Discontinued        650 mg 226 mL/hr over 30 Minutes Intravenous Daily 06/20/20 1320 06/22/20 1548   06/20/20 1800  ertapenem (INVANZ) 1,000 mg in sodium chloride 0.9 % 100 mL IVPB  Status:  Discontinued        1 g 200 mL/hr over 30 Minutes Intravenous Every 24 hours 06/20/20 1402 06/22/20 1548   06/19/20 2000  doxycycline (VIBRA-TABS) tablet 100 mg  Status:  Discontinued        100 mg Oral 2 times per day 06/19/20 1324 06/20/20 1320   06/17/20 1200  vancomycin (VANCOREADY) IVPB 750 mg/150 mL  Status:  Discontinued        750 mg 150 mL/hr over 60 Minutes Intravenous Every 12 hours 06/16/20 2115 06/19/20  1322   06/17/20 0015  piperacillin-tazobactam (ZOSYN) IVPB 3.375 g  Status:  Discontinued        3.375 g 12.5 mL/hr over 240 Minutes Intravenous Every 8 hours 06/17/20 0004 06/20/20 1402   06/17/20 0000  piperacillin-tazobactam (ZOSYN) IVPB 3.375 g  Status:  Discontinued        3.375 g 100 mL/hr over 30 Minutes Intravenous Every 6 hours 06/16/20 2329 06/17/20 0003   06/16/20 2200  cefTRIAXone (ROCEPHIN) 2 g in sodium chloride 0.9 % 100 mL IVPB  Status:  Discontinued        2 g 200 mL/hr over 30 Minutes Intravenous Every 24 hours 06/16/20 2101 06/16/20 2329   06/16/20 2115  vancomycin (VANCOREADY) IVPB 1500 mg/300 mL        1,500 mg 150 mL/hr over 120 Minutes Intravenous  Once 06/16/20 2106 06/17/20 0235   06/16/20 1545  piperacillin-tazobactam (ZOSYN) IVPB 3.375 g        3.375 g 100 mL/hr over 30 Minutes Intravenous  Once 06/16/20 1533 06/16/20 1633      PRN meds: acetaminophen **OR** acetaminophen, docusate sodium, liver oil-zinc oxide, ondansetron **OR** ondansetron (ZOFRAN) IV, oxyCODONE, polyethylene glycol, sodium chloride flush   Objective: Vitals:   06/22/20 2038 06/23/20 0559  BP: 126/60 134/66  Pulse: 95 100  Resp: 18 15  Temp: 100.1 F (37.8 C) 99.1 F (37.3 C)  SpO2: 100% 100%  Intake/Output Summary (Last 24 hours) at 06/23/2020 1156 Last data filed at 06/23/2020 1000 Gross per 24 hour  Intake 2478.95 ml  Output 150 ml  Net 2328.95 ml   Filed Weights   06/17/20 1145 06/18/20 0500 06/19/20 0844  Weight: 74.8 kg 81.8 kg 81.8 kg   Weight change:  Body mass index is 30.02 kg/m.   Physical Exam: General exam: Pleasant middle-aged African-American female.  Not in distress.  On low-flow oxygen Skin: No rashes, lesions or ulcers. HEENT: Atraumatic, normocephalic, no obvious bleeding Lungs: Clear to auscultation bilaterally CVS: Regular rate and rhythm, no murmur GI/Abd soft, nontender, nondistended, bowel sound present Back: Deferred to examination of wound  care nurse and surgery for status of sacral ulcer CNS: Alert, awake, oriented x3 Psychiatry: Mood appropriate Extremities: No pedal edema, no calf tenderness  Data Review: I have personally reviewed the laboratory data and studies available.  Recent Labs  Lab 06/19/20 0823 06/20/20 0546 06/21/20 1048 06/22/20 0931 06/23/20 0607  WBC 18.1* 14.4* 13.2* 11.5* 13.7*  NEUTROABS 14.3* 11.6* 10.6* 8.8* 11.1*  HGB 8.4* 8.5* 8.6* 9.3* 7.9*  HCT 25.7* 26.9* 27.0* 28.5* 24.4*  MCV 79.8* 83.3 81.6 80.5 80.0  PLT 408* 391 427* 457* 507*   Recent Labs  Lab 06/17/20 0436 06/18/20 0720 06/19/20 0823 06/20/20 0546 06/21/20 1048 06/22/20 0931 06/23/20 0607  NA 136 137 136 135 135 136 133*  K 2.8* 2.7* 3.7 4.1 3.6 3.7 4.0  CL 100 100 101 102 101 100 98  CO2 24 25 24 23 23 24 25   GLUCOSE 103* 122* 120* 100* 121* 89 105*  BUN 21* 10 9 8 7 8 9   CREATININE 0.73 0.72 0.57 0.57 0.61 0.54 0.57  CALCIUM 8.5* 8.4* 8.4* 8.4* 8.5* 8.6* 8.4*  MG 2.1 1.4* 1.6*  --  1.4* 1.4*  --   PHOS  --  1.8* 2.5 2.3* 2.5 2.1*  --     F/u labs ordered  Signed, Terrilee Croak, MD [facility] 06/23/2020

## 2020-06-23 NOTE — Progress Notes (Signed)
We have seen the patient twice today and she has refused for [place] to turn her and look at her wound both times today.  Continue current therapy.  Will try to assess tomorrow if patient will allow [place] to.  Henreitta Cea 1:29 PM 06/23/2020

## 2020-06-24 ENCOUNTER — Inpatient Hospital Stay (HOSPITAL_COMMUNITY): Payer: Medicare Other

## 2020-06-24 DIAGNOSIS — A419 Sepsis, unspecified organism: Secondary | ICD-10-CM | POA: Diagnosis not present

## 2020-06-24 DIAGNOSIS — R6520 Severe sepsis without septic shock: Secondary | ICD-10-CM | POA: Diagnosis not present

## 2020-06-24 LAB — CBC WITH DIFFERENTIAL/PLATELET
Abs Immature Granulocytes: 0.11 10*3/uL — ABNORMAL HIGH (ref 0.00–0.07)
Basophils Absolute: 0.1 10*3/uL (ref 0.0–0.1)
Basophils Relative: 1 %
Eosinophils Absolute: 0.1 10*3/uL (ref 0.0–0.5)
Eosinophils Relative: 1 %
HCT: 25.8 % — ABNORMAL LOW (ref 36.0–46.0)
Hemoglobin: 8.5 g/dL — ABNORMAL LOW (ref 12.0–15.0)
Immature Granulocytes: 1 %
Lymphocytes Relative: 13 %
Lymphs Abs: 1.5 10*3/uL (ref 0.7–4.0)
MCH: 27 pg (ref 26.0–34.0)
MCHC: 32.9 g/dL (ref 30.0–36.0)
MCV: 81.9 fL (ref 80.0–100.0)
Monocytes Absolute: 0.8 10*3/uL (ref 0.1–1.0)
Monocytes Relative: 6 %
Neutro Abs: 9.7 10*3/uL — ABNORMAL HIGH (ref 1.7–7.7)
Neutrophils Relative %: 78 %
Platelets: 545 10*3/uL — ABNORMAL HIGH (ref 150–400)
RBC: 3.15 MIL/uL — ABNORMAL LOW (ref 3.87–5.11)
RDW: 19.8 % — ABNORMAL HIGH (ref 11.5–15.5)
WBC: 12.3 10*3/uL — ABNORMAL HIGH (ref 4.0–10.5)
nRBC: 0 % (ref 0.0–0.2)

## 2020-06-24 LAB — BASIC METABOLIC PANEL
Anion gap: 10 (ref 5–15)
BUN: 11 mg/dL (ref 6–20)
CO2: 26 mmol/L (ref 22–32)
Calcium: 8.6 mg/dL — ABNORMAL LOW (ref 8.9–10.3)
Chloride: 100 mmol/L (ref 98–111)
Creatinine, Ser: 0.37 mg/dL — ABNORMAL LOW (ref 0.44–1.00)
GFR, Estimated: >60 mL/min (ref >60)
Glucose, Bld: 102 mg/dL — ABNORMAL HIGH (ref 70–99)
Potassium: 3.9 mmol/L (ref 3.5–5.1)
Sodium: 136 mmol/L (ref 135–145)

## 2020-06-24 LAB — PHOSPHORUS: Phosphorus: 2.2 mg/dL — ABNORMAL LOW (ref 2.5–4.6)

## 2020-06-24 LAB — GLUCOSE, CAPILLARY
Glucose-Capillary: 102 mg/dL — ABNORMAL HIGH (ref 70–99)
Glucose-Capillary: 109 mg/dL — ABNORMAL HIGH (ref 70–99)
Glucose-Capillary: 110 mg/dL — ABNORMAL HIGH (ref 70–99)
Glucose-Capillary: 119 mg/dL — ABNORMAL HIGH (ref 70–99)

## 2020-06-24 LAB — MAGNESIUM: Magnesium: 1.7 mg/dL (ref 1.7–2.4)

## 2020-06-24 MED ORDER — MAGNESIUM SULFATE 2 GM/50ML IV SOLN
2.0000 g | Freq: Once | INTRAVENOUS | Status: AC
  Administered 2020-06-24: 2 g via INTRAVENOUS
  Filled 2020-06-24: qty 50

## 2020-06-24 MED ORDER — K PHOS MONO-SOD PHOS DI & MONO 155-852-130 MG PO TABS
500.0000 mg | ORAL_TABLET | Freq: Once | ORAL | Status: AC
  Administered 2020-06-24: 13:00:00 500 mg via ORAL
  Filled 2020-06-24: qty 2

## 2020-06-24 NOTE — NC FL2 (Signed)
[place] LEVEL OF CARE SCREENING TOOL     IDENTIFICATION  Patient Name: Tina Griffith Birthdate: 09/02/1960 Sex: female Admission Date (Current Location): 06/16/2020  [place] and [place] Number:  Tina Griffith [id number] [place] and Address:  [facility],  [place] [address] [place]      Provider Number: [id number]  Attending Physician Name and Address:  Terrilee Croak, MD  Relative Name and Phone Number:  daughter, [name] @ [phone]    Current Level of Care: Hospital Recommended Level of Care: [place] Prior Approval Number:    Date Approved/Denied:   PASRR Number: [id number]  Discharge Plan: SNF    Current Diagnoses: Patient Active Problem List   Diagnosis Date Noted  . Malnutrition of moderate degree 06/19/2020  . Wound infection 06/17/2020  . Leucocytosis 06/16/2020  . Sepsis with acute organ dysfunction without septic shock ([place]) 06/16/2020  . UTI (urinary tract infection) 06/16/2020  . Depression with anxiety 04/20/2016  . Insomnia 04/20/2016  . Chronic prescription opiate use 01/16/2016  . Hypertriglyceridemia 06/20/2015  . Abnormal urine odor 10/09/2014  . Chronic kidney disease 10/09/2014  . Elevated WBC count 10/09/2014  . Multiple sclerosis ([place]) 07/25/2014  . Spastic gait 07/25/2014  . Leg pain, left 07/25/2014  . Urinary frequency 07/25/2014  . Constipation 07/25/2014  . Decreased potassium in the blood 04/22/2014  . Hypomagnesemia 04/22/2014  . Ataxic gait 02/28/2014  . Callosity 02/28/2014  . 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 Contracture of ankle and foot joint 02/28/2014  . Decubital ulcer 02/28/2014  . Fibroid 02/28/2014  . Dysfunctional or functional uterine hemorrhage 02/28/2014  . Incisional hernia with obstruction but no gangrene 02/28/2014  . Gonalgia 02/28/2014  . Extremity pain 02/28/2014  . Decreased motor strength 02/28/2014  . Non-pressure ulcer of lower  extremity ([place]) 02/28/2014  . Loss of feeling or sensation 02/28/2014  . Anterior optic neuritis 02/28/2014  . Hemorrhage, postmenopausal 02/28/2014  . Arthritis of knee, degenerative 02/28/2014  . AS (sickle cell trait) ([place]) 02/28/2014  . Hemiplegia, spastic ([place]) 02/28/2014  . Sickle cell trait ([place]) 02/28/2014  . Spastic hemiplegia ([place]) 02/28/2014  . Abnormal results of thyroid function studies 12/28/2013  . Allergic rhinitis 08/22/2013  . Disorder of magnesium metabolism 08/22/2013  . Essential (primary) hypertension 08/22/2013  . Hypercholesteremia 08/22/2013  . Adult hypothyroidism 08/22/2013  . Anemia, iron deficiency 08/22/2013  . Compulsive tobacco user syndrome 08/22/2013  . Absence of bladder continence 08/22/2013  . Hypercholesterolemia 08/22/2013  . Current tobacco use 08/22/2013  . Accumulation of fluid in tissues 11/29/2012  . Dermatophytic onychia 10/28/2012  . Colon, diverticulosis 10/28/2012  . Polypharmacy 10/28/2012  . DS (disseminated sclerosis) ([place]) 10/28/2012  . Arthralgia of lower leg 10/28/2012  . Menopausal symptom 10/28/2012  . Current tear of lateral cartilage or meniscus of knee 10/28/2012  . Diverticular disease of large intestine 10/28/2012  . Other long term (current) drug therapy 10/28/2012  . Abnormal uterine bleeding 06/26/2012  . Baseball finger 08/03/2010    Orientation RESPIRATION BLADDER Height & Weight     Self,Time,Place,Situation  Normal Incontinent Weight: 180 lb 6.4 oz (81.8 kg) Height:  5\' 5"  (165.1 cm)  BEHAVIORAL SYMPTOMS/MOOD NEUROLOGICAL BOWEL NUTRITION STATUS      Incontinent    AMBULATORY STATUS COMMUNICATION OF NEEDS Skin   Total Care Verbally PU Stage and Appropriate Care       PU Stage 4 Dressing: BID (Moist-to-dry dressing changes to sacral wound TWICE DAILY using kerlix, normal saline, ABDs.)  Personal Care Assistance Level of Assistance  Bathing,Dressing,Feeding Bathing Assistance: Maximum  assistance Feeding assistance: Limited assistance Dressing Assistance: Maximum assistance     Functional Limitations Info             SPECIAL CARE FACTORS FREQUENCY  PT (By licensed PT),OT (By licensed OT)     PT Frequency: 3-5 x/wk OT Frequency: 3-5 x/wk            Contractures Contractures Info: Not present    Additional Factors Info  Code Status,Allergies Code Status Info: Full Allergies Info: NKDA           Current Medications (06/24/2020):  This is the current hospital active medication list Current Facility-Administered Medications  Medication Dose Route Frequency Provider Last Rate Last Admin  . (feeding supplement) PROSource Plus liquid 30 mL  30 mL Oral TID WC Armandina Gemma, MD   30 mL at 06/23/20 2225  . acetaminophen (TYLENOL) tablet 650 mg  650 mg Oral Q6H PRN Armandina Gemma, MD   650 mg at 06/19/20 2213   Or  . acetaminophen (TYLENOL) suppository 650 mg  650 mg Rectal Q6H PRN Armandina Gemma, MD      . amoxicillin-clavulanate (AUGMENTIN) 875-125 MG per tablet 1 tablet  1 tablet Oral Q12H Vu, Rockey Situ, MD   1 tablet at 06/24/20 1032  . aspirin EC tablet 81 mg  81 mg Oral Daily Armandina Gemma, MD   81 mg at 06/24/20 1033  . benzonatate (TESSALON) capsule 100 mg  100 mg Oral TID Armandina Gemma, MD   100 mg at 06/24/20 1033  . Chlorhexidine Gluconate Cloth 2 % PADS 6 each  6 each Topical Daily Armandina Gemma, MD   6 each at 06/24/20 1033  . Chlorhexidine Gluconate Cloth 2 % PADS 6 each  6 each Topical [id number] Terrilee Croak, MD   6 each at 06/24/20 0618  . dalfampridine TB12 10 mg  10 mg Oral BID Armandina Gemma, MD   10 mg at 06/23/20 2225  . docusate sodium (COLACE) capsule 100 mg  100 mg Oral BID PRN Armandina Gemma, MD      . doxycycline (VIBRA-TABS) tablet 100 mg  100 mg Oral Q12H Vu, Trung T, MD   100 mg at 06/24/20 1033  . enoxaparin (LOVENOX) injection 40 mg  40 mg Subcutaneous Q24H Armandina Gemma, MD   40 mg at 06/24/20 0839  . ferrous sulfate tablet 325 mg  325 mg Oral BID  Armandina Gemma, MD   325 mg at 06/24/20 1033  . fluticasone furoate-vilanterol (BREO ELLIPTA) 100-25 MCG/INH 1 puff  1 puff Inhalation Daily Armandina Gemma, MD   1 puff at 06/24/20 0915  . insulin aspart (novoLOG) injection 0-5 Units  0-5 Units Subcutaneous QHS Armandina Gemma, MD      . insulin aspart (novoLOG) injection 0-9 Units  0-9 Units Subcutaneous TID WC Armandina Gemma, MD   1 Units at 06/21/20 0827  . liver oil-zinc oxide (DESITIN) 40 % ointment   Topical TID PRN Armandina Gemma, MD      . metoprolol succinate (TOPROL-XL) 24 hr tablet 100 mg  100 mg Oral Daily Armandina Gemma, MD   100 mg at 06/24/20 1033  . multivitamin with minerals tablet 1 tablet  1 tablet Oral Daily Armandina Gemma, MD   1 tablet at 06/24/20 1033  . ondansetron (ZOFRAN-ODT) disintegrating tablet 4 mg  4 mg Oral Q6H PRN Armandina Gemma, MD       Or  . ondansetron [facility]) injection 4  mg  4 mg Intravenous Q6H PRN Armandina Gemma, MD      . oxybutynin (DITROPAN-XL) 24 hr tablet 10 mg  10 mg Oral Daily Armandina Gemma, MD   10 mg at 06/24/20 1033  . oxyCODONE (Oxy IR/ROXICODONE) immediate release tablet 5-10 mg  5-10 mg Oral Q4H PRN Armandina Gemma, MD   10 mg at 06/24/20 1032  . pantoprazole (PROTONIX) EC tablet 40 mg  40 mg Oral Daily Armandina Gemma, MD   40 mg at 06/24/20 1033  . Plecanatide TABS 1 tablet  1 tablet Oral Daily Armandina Gemma, MD   1 tablet at 06/24/20 1034  . polyethylene glycol (MIRALAX / GLYCOLAX) packet 17 g  17 g Oral Daily PRN Armandina Gemma, MD      . pravastatin (PRAVACHOL) tablet 20 mg  20 mg Oral q1800 Armandina Gemma, MD   20 mg at 06/23/20 1716  . sodium chloride flush (NS) 0.9 % injection 10-40 mL  10-40 mL Intracatheter PRN Armandina Gemma, MD      . tiZANidine (ZANAFLEX) tablet 8 mg  8 mg Oral TID Armandina Gemma, MD   8 mg at 06/24/20 1032     Discharge Medications: Please see discharge summary for a list of discharge medications.  Relevant Imaging Results:  Relevant Lab Results:   Additional Information SS# [id number];  pt has had COVID vaccination but not booster  Elonda Giuliano, LCSW

## 2020-06-24 NOTE — Progress Notes (Signed)
5 Days Post-Op  Subjective: Sleeping, awoke, no complaints except a lot of pain to turn her.  ROS: See above, otherwise other systems negative  Objective: Vital signs in last 24 hours: Temp:  [98.7 F (37.1 C)-99.6 F (37.6 C)] 99.5 F (37.5 C) (02/01 0624) Pulse Rate:  [96-100] 98 (02/01 0624) Resp:  [18-20] 18 (02/01 0624) BP: (124-130)/(62-66) 124/65 (02/01 0624) SpO2:  [94 %-100 %] 94 % (02/01 0917) Last BM Date: 06/22/20  Intake/Output from previous day: 01/31 0701 - 02/01 0700 In: 600 [P.O.:600] Out: 1050 [Urine:1050] Intake/Output this shift: Total I/O In: -  Out: 600 [Urine:600]  PE: Skin: stage IV wound is essentially 100% clean with beefy red tissue.  Sacral bone is palpable and visible in the middle of the wound     Lab Results:  Recent Labs    06/23/20 0607 06/24/20 0517  WBC 13.7* 12.3*  HGB 7.9* 8.5*  HCT 24.4* 25.8*  PLT 507* 545*   BMET Recent Labs    06/23/20 0607 06/24/20 0517  NA 133* 136  K 4.0 3.9  CL 98 100  CO2 25 26  GLUCOSE 105* 102*  BUN 9 11  CREATININE 0.57 0.37*  CALCIUM 8.4* 8.6*   PT/INR No results for input(s): LABPROT, INR in the last 72 hours. CMP     Component Value Date/Time   NA 136 06/24/2020 0517   K 3.9 06/24/2020 0517   CL 100 06/24/2020 0517   CO2 26 06/24/2020 0517   GLUCOSE 102 (H) 06/24/2020 0517   BUN 11 06/24/2020 0517   CREATININE 0.37 (L) 06/24/2020 0517   CALCIUM 8.6 (L) 06/24/2020 0517   PROT 5.0 (L) 06/18/2020 0720   PROT 7.4 09/16/2015 1347   ALBUMIN 1.9 (L) 06/18/2020 0720   ALBUMIN 4.4 09/16/2015 1347   AST 18 06/18/2020 0720   ALT 16 06/18/2020 0720   ALKPHOS 86 06/18/2020 0720   BILITOT 0.5 06/18/2020 0720   BILITOT 0.5 09/16/2015 1347   GFRNONAA >60 06/24/2020 0517   GFRAA >60 10/16/2014 1320   Lipase     Component Value Date/Time   LIPASE 15 04/01/2012 1815       Studies/Results: No results found.  Anti-infectives: Anti-infectives (From admission, onward)    Start     Dose/Rate Route Frequency Ordered Stop   06/22/20 2200  amoxicillin-clavulanate (AUGMENTIN) 875-125 MG per tablet 1 tablet        1 tablet Oral Every 12 hours 06/22/20 1548 07/01/20 2159   06/22/20 2200  doxycycline (VIBRA-TABS) tablet 100 mg        100 mg Oral Every 12 hours 06/22/20 1548 07/01/20 2159   06/20/20 2000  DAPTOmycin (CUBICIN) 650 mg in sodium chloride 0.9 % IVPB  Status:  Discontinued        650 mg 226 mL/hr over 30 Minutes Intravenous Daily 06/20/20 1320 06/22/20 1548   06/20/20 1800  ertapenem (INVANZ) 1,000 mg in sodium chloride 0.9 % 100 mL IVPB  Status:  Discontinued        1 g 200 mL/hr over 30 Minutes Intravenous Every 24 hours 06/20/20 1402 06/22/20 1548   06/19/20 2000  doxycycline (VIBRA-TABS) tablet 100 mg  Status:  Discontinued        100 mg Oral 2 times per day 06/19/20 1324 06/20/20 1320   06/17/20 1200  vancomycin (VANCOREADY) IVPB 750 mg/150 mL  Status:  Discontinued        750 mg 150 mL/hr over 60 Minutes Intravenous Every 12  hours 06/16/20 2115 06/19/20 1322   06/17/20 0015  piperacillin-tazobactam (ZOSYN) IVPB 3.375 g  Status:  Discontinued        3.375 g 12.5 mL/hr over 240 Minutes Intravenous Every 8 hours 06/17/20 0004 06/20/20 1402   06/17/20 0000  piperacillin-tazobactam (ZOSYN) IVPB 3.375 g  Status:  Discontinued        3.375 g 100 mL/hr over 30 Minutes Intravenous Every 6 hours 06/16/20 2329 06/17/20 0003   06/16/20 2200  cefTRIAXone (ROCEPHIN) 2 g in sodium chloride 0.9 % 100 mL IVPB  Status:  Discontinued        2 g 200 mL/hr over 30 Minutes Intravenous Every 24 hours 06/16/20 2101 06/16/20 2329   06/16/20 2115  vancomycin (VANCOREADY) IVPB 1500 mg/300 mL        1,500 mg 150 mL/hr over 120 Minutes Intravenous  Once 06/16/20 2106 06/17/20 0235   06/16/20 1545  piperacillin-tazobactam (ZOSYN) IVPB 3.375 g        3.375 g 100 mL/hr over 30 Minutes Intravenous  Once 06/16/20 1533 06/16/20 1633       Assessment/Plan Stage IV  decub -100% clean, bone is clearly visible -cont WD dressing changes BID -no surgical needs, we will sign off.   LOS: 8 days    Henreitta Cea , [facility] 06/24/2020, 10:37 AM Please see Amion for pager number during day hours 7:00am-4:30pm or 7:00am -11:30am on weekends

## 2020-06-24 NOTE — Progress Notes (Signed)
Nutrition Follow-up  DOCUMENTATION CODES:   Non-severe (moderate) malnutrition in context of chronic illness  INTERVENTION:   -Prosource Plus PO BID, each provides 100 kcals and 15g protein  -Magic cup BID with meals, each supplement provides 290 kcal and 9 grams of protein  -Daily snacks at 2pm and HS  -Multivitamin with minerals daily  NUTRITION DIAGNOSIS:   Moderate Malnutrition related to chronic illness,wound healing (MS) as evidenced by energy intake < or equal to 75% for > or equal to 1 month,moderate fat depletion,mild muscle depletion.  Ongoing.  GOAL:   Patient will meet greater than or equal to 90% of their needs  Progressing.  MONITOR:   PO intake,Supplement acceptance,Labs,Weight trends,I & O's  ASSESSMENT:   60 y.o. African-American female presented to the [facility] Emergency Department via EMS with complaints of failure to thrive and worsening of gluteal/sacral pressure sores.She has a history of multiple sclerosis, which has her confined to a wheelchair for mobility.  1/27: s/p debridement of stage IV sacral decubitus ulcer with removal of necrotic skin, adipose tissue, fascia, and muscle.  Patient currently consuming 25-50% of meals at this time. Pt is accepting of protein supplements ordered. Will continue current orders for supplements and snacks.  Admission weight: 165 lbs.  Last recorded weight: 180 lbs.  Medications: Ferrous sulfate, Multivitamin with minerals daily, K-Phos, IV Mg sulfate   Labs reviewed: CBGs: 98-109 Low Phos (2.2)  Diet Order:   Diet Order            DIET DYS 3 Room service appropriate? Yes; Fluid consistency: Thin  Diet effective now                 EDUCATION NEEDS:   Education needs have been addressed  Skin:  Skin Assessment: Skin Integrity Issues: Skin Integrity Issues:: Stage IV Stage IV: right buttocks  Last BM:  1/25  Height:   Ht Readings from Last 1 Encounters:  06/17/20  5\' 5"  (1.651 m)    Weight:   Wt Readings from Last 1 Encounters:  06/19/20 81.8 kg   BMI:  Body mass index is 30.02 kg/m.  Estimated Nutritional Needs:   Kcal:  2200-2400  Protein:  110-125g  Fluid:  2.2L/day  Clayton Bibles, MS, RD, LDN Inpatient Clinical Dietitian Contact information available via Amion

## 2020-06-24 NOTE — Progress Notes (Signed)
PROGRESS NOTE  Tina Griffith  DOB: 1961-05-05  PCP: [facility] [id number]  DOA: 06/16/2020  LOS: 8 days   Chief Complaint  Patient presents with  . Wound Infection  . Fatigue   Brief narrative: Tina Griffith is a 60 y.o. female with PMH significant for multiple sclerosis, movement disorder, hypertension, constipation and COVID-19 pneumonia in December 2021.  At baseline, she was using a walker and wheelchair up until [place] admission.  Since then she has remained bedbound at home and has developed sacral decubitus ulcers. 1/24, patient was brought to the ED with complaint of failure to thrive, worsening of gluteal/sacral pressure sores.  Since last hospitalization 4 weeks ago for Covid pneumonia, patient has had anorexia, weight loss, generalized fatigue and worsening lower extremity edema.  Patient was admitted for sepsis secondary to infected sacral decubitus ulcer. 1/27, patient underwent debridement of sacral decubitus ulcer by Dr. Harlow Asa.  Infectious disease consultation was obtained.  Patient was initially placed on broad-spectrum IV antibiotics.  Currently on 2 weeks course of oral antibiotics. General surgery and infectious disease signed off. Currently pending placement.  Subjective: Patient was seen and examined this morning.   Lying on bed.  Not in distress.  Looks weak.  On low-flow oxygen.  T-max nine 9.5 in last 24 hours.  WBC count improving.   Wound examined by general surgery today.  Clean.  Healing.  Assessment/Plan: Sepsis secondary to infected stage IV sacral decubitus ulcer Blood culture positive for Bacteroides ovatus -1/27, patient underwent debridement of sacral decubitus ulcer by Dr. Harlow Asa.  -Wound examined by general surgery today.  Healing appropriately. -One of the 2 blood cultures sent on 1/24 grew Bacteroides ovatus.  Repeat blood culture from 1/28 did not shown any growth so far. -Infectious disease consult appreciated.  Patient was  initially managed on IV ertapenem and IV daptomycin.  Later switched to oral Augmentin and oral doxycycline for total of 2 weeks course to last till 07/01/2020. WBC count improving. Recent Labs  Lab 06/18/20 0720 06/19/20 0823 06/20/20 0546 06/21/20 1048 06/22/20 0931 06/23/20 0607 06/24/20 0517  WBC 14.9*   < > 14.4* 13.2* 11.5* 13.7* 12.3*  LATICACIDVEN 1.1  --   --   --   --   --   --    < > = values in this interval not displayed.   E. coli UTI -More than 100,000 CFU per mL of E. Coli. -On antibiotics as above  New oxygen dependence -Chest x-ray on admission was normal.  Currently patient is requiring up to 4 L oxygen by nasal cannula.  -Lung sounds normal.  Repeat chest x-ray.  When tolerated.  Multiple sclerosis -Continue Home MS  meds -baclofen dalfampridine, ocrelizumab  Pain management -Currently on tramadol as needed, OxyIR as needed, Dilaudid as needed. -Minimize use of pain medicines and other mood altering medications. -Currently on OxyIR 5 mg 3 times daily as needed.  Essential hypertension -Continue metoprolol.  Keep HCTZ on hold.    Diabetes mellitus -A1c 6.5 on 1/26 -Home meds include Victoza 0.6 mg daily. -Currently only on sliding scale insulin with Accu-Cheks. Recent Labs  Lab 06/23/20 0755 06/23/20 1155 06/23/20 1604 06/23/20 2207 06/24/20 0748  GLUCAP 94 117* 103* 98 102*   Elevated troponin -Troponin was initially elevated to 112, trended down. -Currently not having anginal symptoms. -Echocardiogram with EF 60 to 65%, mild LVH. -Continue aspirin and statin  Hypokalemia/hypomagnesemia/hypophosphatemia -Daily monitoring and replacement.  -Labs from this morning with magnesium low at 1.7 phosphorus  level 2.2. -Replacement ordered. Recent Labs  Lab 06/18/20 0720 06/19/20 0823 06/20/20 0546 06/21/20 1048 06/22/20 0931 06/23/20 0607 06/24/20 0517  K 2.7* 3.7 4.1 3.6 3.7 4.0 3.9  MG 1.4* 1.6*  --  1.4* 1.4*  --  1.7  PHOS 1.8* 2.5 2.3*  2.5 2.1*  --  2.2*   Hx of IBS  -on trulance and PPI  Melena Anemia of chronic disease -hemoglobin down from 11.1 on admission to 8.5 today.  Patient had few episodes of melena.  No recurrence.  Iron and ferritin level normal. -No other source of bleeding. -Currently on Protonix.  Continue to monitor. Recent Labs    06/19/20 0826 06/20/20 0546 06/21/20 1048 06/22/20 0931 06/23/20 0607 06/24/20 0517  HGB  --  8.5* 8.6* 9.3* 7.9* 8.5*  MCV  --  83.3 81.6 80.5 80.0 81.9  VITAMINB12 317  --   --   --   --   --   FOLATE 17.2  --   --   --   --   --   FERRITIN 349*  --   --   --   --   --   TIBC 141*  --   --   --   --   --   IRON 20*  --   --   --   --   --   RETICCTPCT 1.8  --   --   --   --   --    Mobility: PT eval obtained.  SNF recommended. Code Status:   Code Status: Full Code  Nutritional status: Body mass index is 30.02 kg/m. Nutrition Problem: Moderate Malnutrition Etiology: chronic illness,wound healing (MS) Signs/Symptoms: energy intake < or equal to 75% for > or equal to 1 month,moderate fat depletion,mild muscle depletion Diet Order            DIET SOFT Room service appropriate? Yes; Fluid consistency: Thin  Diet effective now                 DVT prophylaxis: enoxaparin (LOVENOX) injection 40 mg Start: 06/20/20 0800 SCD's Start: 06/19/20 1520   Antimicrobials:  Oral Augmentin and oral doxycycline Fluid: None Consultants: General surgery Family Communication:  Family updated  Status is: Inpatient  Remains inpatient appropriate because: Has a deep sacral pressure ulcer.  Pending placement  Dispo: The patient is from: Home              Anticipated d/c is to: SNF              Anticipated d/c date is: Whenever bed is available              Patient currently is medically stable to d/c.   Difficult to place patient No     Infusions:  . magnesium sulfate bolus IVPB      Scheduled Meds: . (feeding supplement) PROSource Plus  30 mL Oral TID WC  .  amoxicillin-clavulanate  1 tablet Oral Q12H  . aspirin EC  81 mg Oral Daily  . benzonatate  100 mg Oral TID  . Chlorhexidine Gluconate Cloth  6 each Topical Daily  . Chlorhexidine Gluconate Cloth  6 each Topical Q0600  . dalfampridine  10 mg Oral BID  . doxycycline  100 mg Oral Q12H  . enoxaparin (LOVENOX) injection  40 mg Subcutaneous Q24H  . ferrous sulfate  325 mg Oral BID  . fluticasone furoate-vilanterol  1 puff Inhalation Daily  . insulin aspart  0-5 Units  Subcutaneous QHS  . insulin aspart  0-9 Units Subcutaneous TID WC  . metoprolol succinate  100 mg Oral Daily  . multivitamin with minerals  1 tablet Oral Daily  . oxybutynin  10 mg Oral Daily  . pantoprazole  40 mg Oral Daily  . phosphorus  500 mg Oral Once  . Plecanatide  1 tablet Oral Daily  . pravastatin  20 mg Oral q1800  . tiZANidine  8 mg Oral TID    Antimicrobials: Anti-infectives (From admission, onward)   Start     Dose/Rate Route Frequency Ordered Stop   06/22/20 2200  amoxicillin-clavulanate (AUGMENTIN) 875-125 MG per tablet 1 tablet        1 tablet Oral Every 12 hours 06/22/20 1548 07/01/20 2159   06/22/20 2200  doxycycline (VIBRA-TABS) tablet 100 mg        100 mg Oral Every 12 hours 06/22/20 1548 07/01/20 2159   06/20/20 2000  DAPTOmycin (CUBICIN) 650 mg in sodium chloride 0.9 % IVPB  Status:  Discontinued        650 mg 226 mL/hr over 30 Minutes Intravenous Daily 06/20/20 1320 06/22/20 1548   06/20/20 1800  ertapenem (INVANZ) 1,000 mg in sodium chloride 0.9 % 100 mL IVPB  Status:  Discontinued        1 g 200 mL/hr over 30 Minutes Intravenous Every 24 hours 06/20/20 1402 06/22/20 1548   06/19/20 2000  doxycycline (VIBRA-TABS) tablet 100 mg  Status:  Discontinued        100 mg Oral 2 times per day 06/19/20 1324 06/20/20 1320   06/17/20 1200  vancomycin (VANCOREADY) IVPB 750 mg/150 mL  Status:  Discontinued        750 mg 150 mL/hr over 60 Minutes Intravenous Every 12 hours 06/16/20 2115 06/19/20 1322    06/17/20 0015  piperacillin-tazobactam (ZOSYN) IVPB 3.375 g  Status:  Discontinued        3.375 g 12.5 mL/hr over 240 Minutes Intravenous Every 8 hours 06/17/20 0004 06/20/20 1402   06/17/20 0000  piperacillin-tazobactam (ZOSYN) IVPB 3.375 g  Status:  Discontinued        3.375 g 100 mL/hr over 30 Minutes Intravenous Every 6 hours 06/16/20 2329 06/17/20 0003   06/16/20 2200  cefTRIAXone (ROCEPHIN) 2 g in sodium chloride 0.9 % 100 mL IVPB  Status:  Discontinued        2 g 200 mL/hr over 30 Minutes Intravenous Every 24 hours 06/16/20 2101 06/16/20 2329   06/16/20 2115  vancomycin (VANCOREADY) IVPB 1500 mg/300 mL        1,500 mg 150 mL/hr over 120 Minutes Intravenous  Once 06/16/20 2106 06/17/20 0235   06/16/20 1545  piperacillin-tazobactam (ZOSYN) IVPB 3.375 g        3.375 g 100 mL/hr over 30 Minutes Intravenous  Once 06/16/20 1533 06/16/20 1633      PRN meds: acetaminophen **OR** acetaminophen, docusate sodium, liver oil-zinc oxide, ondansetron **OR** ondansetron (ZOFRAN) IV, oxyCODONE, polyethylene glycol, sodium chloride flush   Objective: Vitals:   06/24/20 0624 06/24/20 0917  BP: 124/65   Pulse: 98   Resp: 18   Temp: 99.5 F (37.5 C)   SpO2: 100% 94%    Intake/Output Summary (Last 24 hours) at 06/24/2020 1147 Last data filed at 06/24/2020 1000 Gross per 24 hour  Intake 600 ml  Output 1500 ml  Net -900 ml   Filed Weights   06/17/20 1145 06/18/20 0500 06/19/20 0844  Weight: 74.8 kg 81.8 kg 81.8 kg  Weight change:  Body mass index is 30.02 kg/m.   Physical Exam: General exam: Pleasant middle-aged African-American female.  Not in distress.  On low-flow oxygen Skin: No rashes, lesions or ulcers. HEENT: Atraumatic, normocephalic, no obvious bleeding Lungs: Clear to auscultation bilaterally CVS: Regular rate and rhythm, no murmur GI/Abd soft, nontender, nondistended, bowel sound present Back: Per general surgery note, wound is clean and beefy red with exposed bone CNS:  Alert, awake, oriented x3 Psychiatry: Mood appropriate Extremities: No pedal edema, no calf tenderness  Data Review: I have personally reviewed the laboratory data and studies available.  Recent Labs  Lab 06/20/20 0546 06/21/20 1048 06/22/20 0931 06/23/20 0607 06/24/20 0517  WBC 14.4* 13.2* 11.5* 13.7* 12.3*  NEUTROABS 11.6* 10.6* 8.8* 11.1* 9.7*  HGB 8.5* 8.6* 9.3* 7.9* 8.5*  HCT 26.9* 27.0* 28.5* 24.4* 25.8*  MCV 83.3 81.6 80.5 80.0 81.9  PLT 391 427* 457* 507* 545*   Recent Labs  Lab 06/18/20 0720 06/19/20 0823 06/20/20 0546 06/21/20 1048 06/22/20 0931 06/23/20 0607 06/24/20 0517  NA 137 136 135 135 136 133* 136  K 2.7* 3.7 4.1 3.6 3.7 4.0 3.9  CL 100 101 102 101 100 98 100  CO2 25 24 23 23 24 25 26   GLUCOSE 122* 120* 100* 121* 89 105* 102*  BUN 10 9 8 7 8 9 11   CREATININE 0.72 0.57 0.57 0.61 0.54 0.57 0.37*  CALCIUM 8.4* 8.4* 8.4* 8.5* 8.6* 8.4* 8.6*  MG 1.4* 1.6*  --  1.4* 1.4*  --  1.7  PHOS 1.8* 2.5 2.3* 2.5 2.1*  --  2.2*    F/u labs ordered  Signed, Terrilee Croak, MD [facility] 06/24/2020

## 2020-06-24 NOTE — TOC Progression Note (Signed)
Transition of Care [facility]) - Progression Note    Patient Details  Name: Tina Griffith MRN: [id number] Date of Birth: 1960/08/18  Transition of Care [facility]) CM/SW Contact  Lennart Pall, LCSW Phone Number: 06/24/2020, 11:06 AM  Clinical Narrative:    Alerted by MDs that no further plans for wound and medically ready for SNF transition.  Have sent FL2 out to area facilities.  Continue to follow.  (Pt has had COVID vaccines but no booster).   Expected Discharge Plan: [place] (vs home with [facility]) Barriers to Discharge: Continued Medical Work up  Expected Discharge Plan and Services Expected Discharge Plan: [place] (vs home with [facility]) In-house Referral: Clinical Social Work     Living arrangements for the past 2 months: Single Family Home                                       Social Determinants of Health (SDOH) Interventions    Readmission Risk Interventions Readmission Risk Prevention Plan 06/18/2020  Transportation Screening Complete  PCP or Specialist Appt within 5-7 Days Complete  Home Care Screening Complete  Medication Review (RN CM) Complete  Some recent data might be hidden

## 2020-06-25 DIAGNOSIS — I10 Essential (primary) hypertension: Secondary | ICD-10-CM

## 2020-06-25 DIAGNOSIS — G35 Multiple sclerosis: Secondary | ICD-10-CM

## 2020-06-25 DIAGNOSIS — A419 Sepsis, unspecified organism: Secondary | ICD-10-CM | POA: Diagnosis not present

## 2020-06-25 DIAGNOSIS — D638 Anemia in other chronic diseases classified elsewhere: Secondary | ICD-10-CM

## 2020-06-25 DIAGNOSIS — D649 Anemia, unspecified: Secondary | ICD-10-CM

## 2020-06-25 DIAGNOSIS — L89154 Pressure ulcer of sacral region, stage 4: Secondary | ICD-10-CM | POA: Diagnosis not present

## 2020-06-25 LAB — CULTURE, BLOOD (ROUTINE X 2)
Culture: NO GROWTH
Culture: NO GROWTH
Special Requests: ADEQUATE
Special Requests: ADEQUATE

## 2020-06-25 LAB — GLUCOSE, CAPILLARY
Glucose-Capillary: 109 mg/dL — ABNORMAL HIGH (ref 70–99)
Glucose-Capillary: 112 mg/dL — ABNORMAL HIGH (ref 70–99)
Glucose-Capillary: 105 mg/dL — ABNORMAL HIGH (ref 70–99)
Glucose-Capillary: 111 mg/dL — ABNORMAL HIGH (ref 70–99)

## 2020-06-25 MED ORDER — SENNOSIDES-DOCUSATE SODIUM 8.6-50 MG PO TABS
1.0000 | ORAL_TABLET | Freq: Two times a day (BID) | ORAL | Status: DC
  Administered 2020-06-25 – 2020-07-07 (×24): 1 via ORAL
  Filled 2020-06-25 (×24): qty 1

## 2020-06-25 NOTE — Progress Notes (Signed)
Spoke with both the daughter and sister of patient today, explained to both that Tina Griffith had a bath, a CHG bath, denied her breakfast, but ate some lunch. Patient has been turned q2h today and has no complaints. Will continue to provide excellent care for this patient throughout the day and will let supervisor know that [name], the sister would like to speak with her.

## 2020-06-25 NOTE — Progress Notes (Addendum)
Red MEWS started. Pt's temp 102.2, BP 127/73, HR 114, RR 16, O2 100 % [place]. Pt c/o 10/10 sacral wound pain. Pain meds and Tylenol given. [place], NP notified. Will continue to monitor.   Vitals rechecked, Green MEWs, Temp 101.2. HR 100, pain 0/10, BP 113/58, MAP 74, O2 100%. continuing red MEWS vitals.

## 2020-06-25 NOTE — Progress Notes (Signed)
Physical Therapy Treatment Patient Details Name: Tina Griffith MRN: [id number] DOB: 1960-11-26 Today's Date: 06/25/2020    History of Present Illness Patient is 60 y.o. female with PMH significant for MS, Lt hemiplegia, HTN, and diagnosed with COVID-19 pneumonia in December 2021.  At baseline, she was using a walker and wheelchair PTA for COVID.  Since then she has remained bedbound at home and has developed sacral decubitus ulcers. Patient admitted for sepsis secondary to infected sacral decubitus ulcer and underwent surgical debridement on 1/27.    PT Comments    Patient agreeable to therapy session today and session focused on AA/PROM exercises for strengthening and flexibility. Pt able to activate Rt LE for quad set and assist required for knee flexion 2/2 pain and weakness. Pt had difficulty coordinating quad set and tactile cues required at knee and ankle. Pt able to perform UE PNE patterns bil with, greater assist required on Lt UE compared to Rt and pt too weak to apply resistance at this time. Muscle energy technique completed for cervical spine ROM as well as pt was resting in Lt rotation and has limited ROM to Rt. EOS pt spine in neutral position with pillows/towels use dto facilitate. She remains limited by buttock wound and pain but pt wishes to continue exercises for strengthening and to progress mobility as able. Acute PT will follow and recommend she continue with therapy in SNF setting.     Follow Up Recommendations  SNF (wound care and therapy for ROM/strengthening)     Equipment Recommendations  None recommended by PT    Recommendations for Other Services       Precautions / Restrictions Precautions Precautions: Fall Restrictions Weight Bearing Restrictions: No    Mobility  Bed Mobility Overal bed mobility: Needs Assistance Bed Mobility: Rolling Rolling: +2 for safety/equipment;+2 for physical assistance;Max assist         General bed mobility comments: 2+  Max assist for rolling Lt/Rt. Rolling completed for dressing change. EOB not attempted due to pain from wound.  Transfers                    Ambulation/Gait                 Stairs             Wheelchair Mobility    Modified Rankin (Stroke Patients Only)       Balance                                            Cognition Arousal/Alertness: Awake/alert Behavior During Therapy: Flat affect;WFL for tasks assessed/performed Overall Cognitive Status: Within Functional Limits for tasks assessed                                 General Comments: no family present to provide baseline cognition. pt has improved mentation this date and follows conversation smoothly and is engaging.      Exercises General Exercises - Lower Extremity Ankle Circles/Pumps: PROM;Both;5 reps;Supine Quad Sets: AROM;Right;5 reps;Supine (tactile cue with towel roll under ankle and knee) Heel Slides: PROM;AAROM;5 reps;Both;Supine Other Exercises Other Exercises: D1/2 AAROM for bil UE's in supine, 5 reps each. Other Exercises: MET for cervical ROM as pt ntoed to be resting in Lt cervical rotation and very restricture to rotate Rt. 5  reps of 5 sec Lt rotation resistance with PROM to Rt rotation. ROM improved and pt repositioned with pillows/towel roll to facilitate improved neck posture.    General Comments        Pertinent Vitals/Pain Pain Assessment: Faces Faces Pain Scale: Hurts even more Pain Location: sacrum, buttock with Rt LE ROM and bed mobility Pain Descriptors / Indicators: Discomfort;Grimacing Pain Intervention(s): Limited activity within patient's tolerance;Monitored during session;Repositioned;RN gave pain meds during session    Home Living Family/patient expects to be discharged to:: Skilled nursing facility (pt wishes to pursue PT at SNF for ROM and strengthening if able, also needs wound care)                    Prior Function             PT Goals (current goals can now be found in the care plan section) Acute Rehab PT Goals Patient Stated Goal: pt agrees she wants to work on PT for ROM and strengthening if able at SNF PT Goal Formulation: With patient Time For Goal Achievement: 07/04/20 Potential to Achieve Goals: Fair Progress towards PT goals: Progressing toward goals (slowly)    Frequency    Min 1X/week      PT Plan Current plan remains appropriate    Co-evaluation              AM-PAC PT "6 Clicks" Mobility   Outcome Measure  Help needed turning from your back to your side while in a flat bed without using bedrails?: Total Help needed moving from lying on your back to sitting on the side of a flat bed without using bedrails?: Total Help needed moving to and from a bed to a chair (including a wheelchair)?: Total Help needed standing up from a chair using your arms (e.g., wheelchair or bedside chair)?: Total Help needed to walk in hospital room?: Total Help needed climbing 3-5 steps with a railing? : Total 6 Click Score: 6    End of Session   Activity Tolerance: Patient tolerated treatment well Patient left: in bed;with call bell/phone within reach;with bed alarm set Nurse Communication: Mobility status PT Visit Diagnosis: Muscle weakness (generalized) (M62.81);Difficulty in walking, not elsewhere classified (R26.2)     Time: 0929-5747 PT Time Calculation (min) (ACUTE ONLY): 38 min  Charges:  $Therapeutic Exercise: 23-37 mins $Therapeutic Activity: 8-22 mins                     Verner Mould, DPT Acute Rehabilitation Services Office [phone] Pager [phone]     Tina Griffith 06/25/2020, 3:02 PM

## 2020-06-25 NOTE — Progress Notes (Signed)
PROGRESS NOTE    Tina Griffith  [id number] DOB: Oct 21, 1960 DOA: 06/16/2020 PCP: [facility]    Chief Complaint  Patient presents with  . Wound Infection  . Fatigue    Brief Narrative:  Tina Griffith is a 60 y.o. female with PMH significant for multiple sclerosis, movement disorder, hypertension, constipation and COVID-19 pneumonia in December 2021.  At baseline, she was using a walker and wheelchair up until [facility] admission.  Since then she has remained bedbound at home and has developed sacral decubitus ulcers. 1/24, patient was brought to the ED with complaint of failure to thrive, worsening of gluteal/sacral pressure sores.  Since last hospitalization 4 weeks ago for Covid pneumonia, patient has had anorexia, weight loss, generalized fatigue and worsening lower extremity edema.  Patient was admitted for sepsis secondary to infected sacral decubitus ulcer. 1/27, patient underwent debridement of sacral decubitus ulcer by Dr. Harlow Asa.  Infectious disease consultation was obtained.  Patient was initially placed on broad-spectrum IV antibiotics.  Currently on 2 weeks course of oral antibiotics. General surgery and infectious disease signed off. Currently pending placement.   Assessment & Plan:   Principal Problem:   Sepsis with acute organ dysfunction without septic shock (HCC) Active Problems:   Multiple sclerosis (HCC)   Chronic kidney disease   Decubital ulcer   Essential (primary) hypertension   Hypomagnesemia   Anterior optic neuritis   Hemiplegia, spastic (HCC)   Leucocytosis   UTI (urinary tract infection)   Wound infection   Malnutrition of moderate degree  1 sepsis secondary to infected stage IV sacral decubitus ulcer/blood cultures positive for Bacteroides of ovatus Patient presented with stage IV sacral decubitus ulcer.  Seen in consultation by wound care nurse and general surgery.  Status post debridement of sacral decubitus ulcer per general  surgery 06/19/2020. 1/2 blood cultures from 06/16/2020 + for bacteriodes ovatus.  Repeat blood cultures 06/20/2020 - to date. -ID consulted and was following initially patient was on IV ertapenem and subsequently IV daptomycin.  Patient transition to oral Augmentin and doxycycline for total of 2 weeks through 07/01/2020.  Leukocytosis trending down.  Will need outpatient follow-up with general surgery and ID.  2.  E. coli UTI Was on IV ertapenem as well as IV daptomycin.  Currently on oral antibiotics of Augmentin and doxy.  3.  New oxygen dependence Patient noted to have new O2 requirements currently on 4 L nasal cannula.  Chest x-ray on admission negative.  Check O2 on room air.  Wean O2.  Follow.  4.  Multiple sclerosis Continue home regimen of baclofen, dalfampridine, ocrelizumab.  5.  Pain management Continue current regimen of tramadol as needed, OxyIR as needed, Dilaudid as needed.  Minimize pain medication.  Follow.  6.  Hypertension HCTZ on hold.  Continue metoprolol.  7.  Well-controlled diabetes mellitus type 2 Hemoglobin A1c 6.5 (06/18/2020).  CBG 109 this morning.  Continue to hold Victoza.  Sliding scale insulin.  8.  Elevated troponin Patient noted to have elevated troponin up to 112 which trended down.  Patient asymptomatic.  2D echo with EF of 60 to 65%, mild LVH.  Continue aspirin and statin.  Outpatient follow-up.  9.  Hypokalemia/hypomagnesemia/hypophosphatemia Replete.  Repeat labs in the morning.  Keep magnesium > 2.  Follow.  10.  History of IBS Trulance and PPI.  11.  Anemia of chronic disease/melena Patient noted to have a hemoglobin of 11.1 on admission which trended down to about 8.5.  Patient noted to have had a  few episodes of melena however no recurrence.  Patient with no source of bleeding.  Continue PPI.  Repeat labs in the morning.   DVT prophylaxis: Lovenox Code Status: Full Family Communication: Updated patient.  No family at bedside. Disposition:    Status is: Inpatient    Dispo: The patient is from: Home              Anticipated d/c is to: SNF              Anticipated d/c date is: When SNF bed available              Patient currently medically stable and awaiting SNF placement.   Difficult to place patient undetermined       Consultants:   Infectious disease: Dr. West Bali 06/20/2020  Wound care  General surgery: Dr.Gerkin 06/18/2021  Procedures:   CT abdomen and pelvis 06/16/2020  Chest x-ray 06/16/2020, 06/24/2020  2D echo 06/18/2020  Sharp debridement of stage IV sacral decubitus ulcer with removal of necrotic skin, adipose tissue, fascia and muscle.  Approximate amount of tissue debridement is 15 cm x 10 cm x 5 cm per Dr. Harlow Asa general surgery 06/19/2020  Antimicrobials:   Augmentin 06/22/2020>>>> 07/01/2020  Doxycycline 06/22/2020>>>>> 07/01/2020  Doxycycline 06/19/2020>>>> 06/20/2020  IV daptomycin 06/20/2020>>>> 06/22/2020  IV Invanz 06/20/2020>>>> 06/22/2020  IV Zosyn 06/17/2020>>>> 06/20/2020  IV vancomycin 06/16/2020>>>>> 06/19/2020   Subjective: Sleeping but arousable.  Denies any chest pain or shortness of breath.  No abdominal pain.  States tolerating current diet.  Objective: Vitals:   06/24/20 1812 06/24/20 2132 06/25/20 0509 06/25/20 0924  BP: (!) 136/57 (!) 112/57 131/64 (!) 127/54  Pulse: 90 95 (!) 104 (!) 107  Resp:  18 18   Temp:  99.9 F (37.7 C) 100.2 F (37.9 C)   TempSrc:  Oral Oral   SpO2:  95% 96%   Weight:      Height:        Intake/Output Summary (Last 24 hours) at 06/25/2020 1055 Last data filed at 06/25/2020 1005 Gross per 24 hour  Intake 580 ml  Output 1650 ml  Net -1070 ml   Filed Weights   06/17/20 1145 06/18/20 0500 06/19/20 0844  Weight: 74.8 kg 81.8 kg 81.8 kg    Examination:  General exam: Appears calm and comfortable  Respiratory system: Clear to auscultation anterior lung fields. Respiratory effort normal. Cardiovascular system: S1 & S2 heard, RRR. No JVD,  murmurs, rubs, gallops or clicks. No pedal edema. Gastrointestinal system: Abdomen is nondistended, soft and nontender. No organomegaly or masses felt. Normal bowel sounds heard. Central nervous system: Alert and oriented. No focal neurological deficits. Extremities: Symmetric 5 x 5 power. Skin: No rashes, lesions or ulcers Psychiatry: Judgement and insight appear normal. Mood & affect appropriate.     Data Reviewed: I have personally reviewed following labs and imaging studies  CBC: Recent Labs  Lab 06/20/20 0546 06/21/20 1048 06/22/20 0931 06/23/20 0607 06/24/20 0517  WBC 14.4* 13.2* 11.5* 13.7* 12.3*  NEUTROABS 11.6* 10.6* 8.8* 11.1* 9.7*  HGB 8.5* 8.6* 9.3* 7.9* 8.5*  HCT 26.9* 27.0* 28.5* 24.4* 25.8*  MCV 83.3 81.6 80.5 80.0 81.9  PLT 391 427* 457* 507* 545*    Basic Metabolic Panel: Recent Labs  Lab 06/19/20 0823 06/20/20 0546 06/21/20 1048 06/22/20 0931 06/23/20 0607 06/24/20 0517  NA 136 135 135 136 133* 136  K 3.7 4.1 3.6 3.7 4.0 3.9  CL 101 102 101 100 98 100  CO2 24 23  23 24 25 26   GLUCOSE 120* 100* 121* 89 105* 102*  BUN 9 8 7 8 9 11   CREATININE 0.57 0.57 0.61 0.54 0.57 0.37*  CALCIUM 8.4* 8.4* 8.5* 8.6* 8.4* 8.6*  MG 1.6*  --  1.4* 1.4*  --  1.7  PHOS 2.5 2.3* 2.5 2.1*  --  2.2*    GFR: Estimated Creatinine Clearance: 80 mL/min (A) (by C-G formula based on SCr of 0.37 mg/dL (L)).  Liver Function Tests: No results for input(s): AST, ALT, ALKPHOS, BILITOT, PROT, ALBUMIN in the last 168 hours.  CBG: Recent Labs  Lab 06/24/20 0748 06/24/20 1152 06/24/20 1541 06/24/20 2145 06/25/20 0735  GLUCAP 102* 109* 110* 119* 109*     Recent Results (from the past 240 hour(s))  Blood Culture (routine x 2)     Status: Abnormal   Collection Time: 06/16/20  3:32 PM   Specimen: BLOOD  Result Value Ref Range Status   Specimen Description   Final    BLOOD RIGHT ANTECUBITAL Performed at [facility], [place] [address], [place], [place]  [address]    Special Requests   Final    BOTTLES DRAWN AEROBIC AND ANAEROBIC Blood Culture adequate volume Performed at [facility] [address] [place], [place] [address]    Culture  Setup Time   Final    ANAEROBIC BOTTLE ONLY GRAM NEGATIVE RODS Organism ID to follow CRITICAL RESULT CALLED TO, READ BACK BY AND VERIFIED WITH: E JACKSON PHARMD 06/20/20 0040 JDW    Culture (A)  Final    BACTEROIDES OVATUS BETA LACTAMASE POSITIVE Performed at [facility], [place] [address] [place], [place] [address]    Report Status 06/21/2020 FINAL  Final  Blood Culture ID Panel (Reflexed)     Status: None   Collection Time: 06/16/20  3:32 PM  Result Value Ref Range Status   Enterococcus faecalis NOT DETECTED NOT DETECTED Final   Enterococcus Faecium NOT DETECTED NOT DETECTED Final   Listeria monocytogenes NOT DETECTED NOT DETECTED Final   Staphylococcus species NOT DETECTED NOT DETECTED Final   Staphylococcus aureus (BCID) NOT DETECTED NOT DETECTED Final   Staphylococcus epidermidis NOT DETECTED NOT DETECTED Final   Staphylococcus lugdunensis NOT DETECTED NOT DETECTED Final   Streptococcus species NOT DETECTED NOT DETECTED Final   Streptococcus agalactiae NOT DETECTED NOT DETECTED Final   Streptococcus pneumoniae NOT DETECTED NOT DETECTED Final   Streptococcus pyogenes NOT DETECTED NOT DETECTED Final   A.calcoaceticus-baumannii NOT DETECTED NOT DETECTED Final   Bacteroides fragilis NOT DETECTED NOT DETECTED Final   Enterobacterales NOT DETECTED NOT DETECTED Final   Enterobacter cloacae complex NOT DETECTED NOT DETECTED Final   Escherichia coli NOT DETECTED NOT DETECTED Final   Klebsiella aerogenes NOT DETECTED NOT DETECTED Final   Klebsiella oxytoca NOT DETECTED NOT DETECTED Final   Klebsiella pneumoniae NOT DETECTED NOT DETECTED Final   Proteus species NOT DETECTED NOT DETECTED Final   Salmonella species NOT DETECTED NOT DETECTED Final   Serratia marcescens NOT  DETECTED NOT DETECTED Final   Haemophilus influenzae NOT DETECTED NOT DETECTED Final   Neisseria meningitidis NOT DETECTED NOT DETECTED Final   Pseudomonas aeruginosa NOT DETECTED NOT DETECTED Final   Stenotrophomonas maltophilia NOT DETECTED NOT DETECTED Final   Candida albicans NOT DETECTED NOT DETECTED Final   Candida auris NOT DETECTED NOT DETECTED Final   Candida glabrata NOT DETECTED NOT DETECTED Final   Candida krusei NOT DETECTED NOT DETECTED Final   Candida parapsilosis NOT DETECTED  NOT DETECTED Final   Candida tropicalis NOT DETECTED NOT DETECTED Final   Cryptococcus neoformans/gattii NOT DETECTED NOT DETECTED Final    Comment: Performed at [facility], [address] [place], [place] [address]  Blood Culture (routine x 2)     Status: None   Collection Time: 06/16/20  4:00 PM   Specimen: BLOOD RIGHT HAND  Result Value Ref Range Status   Specimen Description   Final    BLOOD RIGHT HAND Performed at [facility] [address], [place], [place] [address]    Special Requests   Final    BOTTLES DRAWN AEROBIC AND ANAEROBIC Blood Culture results may not be optimal due to an inadequate volume of blood received in culture bottles Performed at [facility] [address] [place] [address] [place], [place] [address]    Culture   Final    NO GROWTH 5 DAYS Performed at [facility], [place] [address] [place], [place] [address]    Report Status 06/21/2020 FINAL  Final  Urine culture     Status: Abnormal   Collection Time: 06/16/20  8:28 PM   Specimen: In/Out Cath Urine  Result Value Ref Range Status   Specimen Description   Final    IN/OUT CATH URINE Performed at [facility] [address], [place], [place] [address]    Special Requests   Final    NONE Performed at [facility], [place] [address], [place], [place] [address]    Culture >=100,000 COLONIES/mL ESCHERICHIA COLI (A)  Final   Report Status  06/19/2020 FINAL  Final   Organism ID, Bacteria ESCHERICHIA COLI (A)  Final      Susceptibility   Escherichia coli - MIC*    AMPICILLIN >=32 RESISTANT Resistant     CEFAZOLIN 16 SENSITIVE Sensitive     CEFEPIME <=0.12 SENSITIVE Sensitive     CEFTRIAXONE <=0.25 SENSITIVE Sensitive     CIPROFLOXACIN >=4 RESISTANT Resistant     GENTAMICIN <=1 SENSITIVE Sensitive     IMIPENEM <=0.25 SENSITIVE Sensitive     NITROFURANTOIN 32 SENSITIVE Sensitive     TRIMETH/SULFA >=320 RESISTANT Resistant     AMPICILLIN/SULBACTAM >=32 RESISTANT Resistant     PIP/TAZO <=4 SENSITIVE Sensitive     * >=100,000 COLONIES/mL ESCHERICHIA COLI  Surgical pcr screen     Status: Abnormal   Collection Time: 06/19/20  8:26 AM   Specimen: Nasal Mucosa; Nasal Swab  Result Value Ref Range Status   MRSA, PCR NEGATIVE NEGATIVE Final   Staphylococcus aureus POSITIVE (A) NEGATIVE Final    Comment: (NOTE) The Xpert SA Assay (FDA approved for NASAL specimens in patients 13 years of age and older), is one component of a comprehensive surveillance program. It is not intended to diagnose infection nor to guide or monitor treatment. Performed at [facility], [place] [address] [place], [place] [address]   Culture, blood (routine x 2)     Status: None   Collection Time: 06/20/20  8:11 AM   Specimen: BLOOD  Result Value Ref Range Status   Specimen Description   Final    BLOOD BLOOD RIGHT FOREARM Performed at [facility] [address], [place], [place] [address]    Special Requests   Final    BOTTLES DRAWN AEROBIC ONLY Blood Culture adequate volume Performed at [facility] [address] [place], [place] [address]    Culture   Final    NO  GROWTH 5 DAYS Performed at [facility], [place] [address] [place], [place] [address]    Report Status 06/25/2020 FINAL  Final  Culture, blood (routine x 2)     Status: None   Collection Time: 06/20/20  8:12 AM    Specimen: BLOOD RIGHT HAND  Result Value Ref Range Status   Specimen Description   Final    BLOOD RIGHT HAND Performed at [facility] [address] [place], [place] [address]    Special Requests   Final    BOTTLES DRAWN AEROBIC ONLY Blood Culture adequate volume Performed at [facility] [address], [place], [place] [address]    Culture   Final    NO GROWTH 5 DAYS Performed at [facility], [place] [address] [place], [place] [address]    Report Status 06/25/2020 FINAL  Final         Radiology Studies: DG CHEST PORT 1 VIEW  Result Date: 06/24/2020 CLINICAL DATA:  Hypoxia.  Sacral wound debridement. EXAM: PORTABLE CHEST 1 VIEW COMPARISON:  06/19/2020 FINDINGS: Patchy airspace disease left upper lobe and left lower lobe is new. Right lung is clear. No effusion. Heart size and vascularity normal. IMPRESSION: New area of infiltrate in the left lung suspicious for pneumonia. Electronically Signed   By: Franchot Gallo M.D.   On: 06/24/2020 12:53        Scheduled Meds: . (feeding supplement) PROSource Plus  30 mL Oral TID WC  . amoxicillin-clavulanate  1 tablet Oral Q12H  . aspirin EC  81 mg Oral Daily  . benzonatate  100 mg Oral TID  . Chlorhexidine Gluconate Cloth  6 each Topical Daily  . dalfampridine  10 mg Oral BID  . doxycycline  100 mg Oral Q12H  . enoxaparin (LOVENOX) injection  40 mg Subcutaneous Q24H  . ferrous sulfate  325 mg Oral BID  . fluticasone furoate-vilanterol  1 puff Inhalation Daily  . insulin aspart  0-5 Units Subcutaneous QHS  . insulin aspart  0-9 Units Subcutaneous TID WC  . metoprolol succinate  100 mg Oral Daily  . multivitamin with minerals  1 tablet Oral Daily  . oxybutynin  10 mg Oral Daily  . pantoprazole  40 mg Oral Daily  . Plecanatide  1 tablet Oral Daily  . pravastatin  20 mg Oral q1800  . senna-docusate  1 tablet Oral BID  . tiZANidine  8 mg Oral TID   Continuous Infusions:   LOS: 9  days    Time spent: 35 minutes    Irine Seal, MD [facility]   To contact the attending provider between 7A-7P or the covering provider during after hours 7P-7A, please log into the web site [email or web address] and access using universal [place] password for that web site. If you do not have the password, please call the hospital operator.  06/25/2020, 10:55 AM

## 2020-06-25 NOTE — Progress Notes (Signed)
SATURATION QUALIFICATIONS: (This note is used to comply with regulatory documentation for home oxygen)  Patient Saturations on Room Air at Rest = 95%  Patient Saturations on Room Air while Ambulating = pt is not ambulatory.  Patient Saturations on  Liters of oxygen while Ambulating = NA  Please briefly explain why patient needs home oxygen:Pt does not need oxygen therapy.

## 2020-06-26 ENCOUNTER — Inpatient Hospital Stay (HOSPITAL_COMMUNITY): Payer: Medicare Other

## 2020-06-26 DIAGNOSIS — R0902 Hypoxemia: Secondary | ICD-10-CM

## 2020-06-26 DIAGNOSIS — K5649 Other impaction of intestine: Secondary | ICD-10-CM

## 2020-06-26 DIAGNOSIS — A419 Sepsis, unspecified organism: Secondary | ICD-10-CM | POA: Diagnosis not present

## 2020-06-26 DIAGNOSIS — R509 Fever, unspecified: Secondary | ICD-10-CM

## 2020-06-26 DIAGNOSIS — L89154 Pressure ulcer of sacral region, stage 4: Secondary | ICD-10-CM | POA: Diagnosis not present

## 2020-06-26 DIAGNOSIS — D638 Anemia in other chronic diseases classified elsewhere: Secondary | ICD-10-CM | POA: Diagnosis not present

## 2020-06-26 DIAGNOSIS — I10 Essential (primary) hypertension: Secondary | ICD-10-CM | POA: Diagnosis not present

## 2020-06-26 LAB — RENAL FUNCTION PANEL
Albumin: 2 g/dL — ABNORMAL LOW (ref 3.5–5.0)
Anion gap: 9 (ref 5–15)
BUN: 11 mg/dL (ref 6–20)
CO2: 23 mmol/L (ref 22–32)
Calcium: 8.7 mg/dL — ABNORMAL LOW (ref 8.9–10.3)
Chloride: 104 mmol/L (ref 98–111)
Creatinine, Ser: 0.66 mg/dL (ref 0.44–1.00)
GFR, Estimated: >60 mL/min (ref >60)
Glucose, Bld: 111 mg/dL — ABNORMAL HIGH (ref 70–99)
Phosphorus: 3 mg/dL (ref 2.5–4.6)
Potassium: 3.8 mmol/L (ref 3.5–5.1)
Sodium: 136 mmol/L (ref 135–145)

## 2020-06-26 LAB — CBC WITH DIFFERENTIAL/PLATELET
Abs Immature Granulocytes: 0.11 10*3/uL — ABNORMAL HIGH (ref 0.00–0.07)
Basophils Absolute: 0.1 10*3/uL (ref 0.0–0.1)
Basophils Relative: 1 %
Eosinophils Absolute: 0.2 10*3/uL (ref 0.0–0.5)
Eosinophils Relative: 2 %
HCT: 28.1 % — ABNORMAL LOW (ref 36.0–46.0)
Hemoglobin: 8.9 g/dL — ABNORMAL LOW (ref 12.0–15.0)
Immature Granulocytes: 1 %
Lymphocytes Relative: 16 %
Lymphs Abs: 2.1 10*3/uL (ref 0.7–4.0)
MCH: 26.6 pg (ref 26.0–34.0)
MCHC: 31.7 g/dL (ref 30.0–36.0)
MCV: 83.9 fL (ref 80.0–100.0)
Monocytes Absolute: 1.1 10*3/uL — ABNORMAL HIGH (ref 0.1–1.0)
Monocytes Relative: 9 %
Neutro Abs: 9.3 10*3/uL — ABNORMAL HIGH (ref 1.7–7.7)
Neutrophils Relative %: 71 %
Platelets: 565 10*3/uL — ABNORMAL HIGH (ref 150–400)
RBC: 3.35 MIL/uL — ABNORMAL LOW (ref 3.87–5.11)
RDW: 20.3 % — ABNORMAL HIGH (ref 11.5–15.5)
WBC: 13 10*3/uL — ABNORMAL HIGH (ref 4.0–10.5)
nRBC: 0 % (ref 0.0–0.2)

## 2020-06-26 LAB — URINALYSIS, ROUTINE W REFLEX MICROSCOPIC
Bilirubin Urine: NEGATIVE
Glucose, UA: NEGATIVE mg/dL
Hgb urine dipstick: NEGATIVE
Ketones, ur: NEGATIVE mg/dL
Leukocytes,Ua: NEGATIVE
Nitrite: NEGATIVE
Protein, ur: NEGATIVE mg/dL
Specific Gravity, Urine: 1.013 (ref 1.005–1.030)
pH: 7 (ref 5.0–8.0)

## 2020-06-26 LAB — GLUCOSE, CAPILLARY
Glucose-Capillary: 107 mg/dL — ABNORMAL HIGH (ref 70–99)
Glucose-Capillary: 118 mg/dL — ABNORMAL HIGH (ref 70–99)
Glucose-Capillary: 118 mg/dL — ABNORMAL HIGH (ref 70–99)
Glucose-Capillary: 88 mg/dL (ref 70–99)

## 2020-06-26 LAB — MAGNESIUM: Magnesium: 1.7 mg/dL (ref 1.7–2.4)

## 2020-06-26 MED ORDER — PIPERACILLIN-TAZOBACTAM 3.375 G IVPB
3.3750 g | Freq: Three times a day (TID) | INTRAVENOUS | Status: AC
  Administered 2020-06-26 – 2020-06-30 (×13): 3.375 g via INTRAVENOUS
  Filled 2020-06-26 (×13): qty 50

## 2020-06-26 MED ORDER — MAGNESIUM SULFATE 2 GM/50ML IV SOLN
2.0000 g | Freq: Once | INTRAVENOUS | Status: AC
  Administered 2020-06-26: 2 g via INTRAVENOUS
  Filled 2020-06-26: qty 50

## 2020-06-26 NOTE — Progress Notes (Addendum)
INFECTIOUS DISEASE PROGRESS NOTE  ID: Tina Griffith is a 60 y.o. female with  Principal Problem:   Sepsis with acute organ dysfunction without septic shock ([place]) Active Problems:   Multiple sclerosis ([place])   Chronic kidney disease   Decubital ulcer   Essential (primary) hypertension   Hypomagnesemia   Anterior optic neuritis   Hemiplegia, spastic (H[facility])   Leucocytosis   UTI (urinary tract infection)   Wound infection   Malnutrition of moderate degree   Anemia of chronic disease   Fever   Hypoxia  Subjective: 60 yo F with MS, DM, adm on 1-24 for evaluation of her sacral wound. It had prev been eval at [facility] on 04-2020. Also, prev COVID 04-2020.   Her WBC was 18, she was afebrile and she had CT scan which did not show osteo (although wound did extend to coccyx). There was also concern for fecal impaction. Her bone was palpable on exam.  She was started on dapto/invanz and had I & D on 1-27 of sacral decub.  She was transitioned to doxy/augmentin no 1-30 to complete 14 days of therapy for this (end 2-8).  Her BCx grew 1/2 Bactereoides ovatus.  Her UCx grew > 100k # coli  Over last 24h she has developed temp 102.2 and SpO2 had decreased to 88%. Her CXR showed new L lung diffuse infiltrates.  Her WBC is now 13, essentially unchanged over last 5 days.  Today she c/o back pain from her wound. She denies sob. States she is having nl BM. Her SpO2 has returned to baseline.   Abtx:  Anti-infectives (From admission, onward)   Start     Dose/Rate Route Frequency Ordered Stop   06/22/20 2200  amoxicillin-clavulanate (AUGMENTIN) 875-125 MG per tablet 1 tablet        1 tablet Oral Every 12 hours 06/22/20 1548 07/01/20 2159   06/22/20 2200  doxycycline (VIBRA-TABS) tablet 100 mg        100 mg Oral Every 12 hours 06/22/20 1548 07/01/20 2159   06/20/20 2000  DAPTOmycin (CUBICIN) 650 mg in sodium chloride 0.9 % IVPB  Status:  Discontinued        650 mg 226 mL/hr over 30 Minutes Intravenous  Daily 06/20/20 1320 06/22/20 1548   06/20/20 1800  ertapenem (INVANZ) 1,000 mg in sodium chloride 0.9 % 100 mL IVPB  Status:  Discontinued        1 g 200 mL/hr over 30 Minutes Intravenous Every 24 hours 06/20/20 1402 06/22/20 1548   06/19/20 2000  doxycycline (VIBRA-TABS) tablet 100 mg  Status:  Discontinued        100 mg Oral 2 times per day 06/19/20 1324 06/20/20 1320   06/17/20 1200  vancomycin (VANCOREADY) IVPB 750 mg/150 mL  Status:  Discontinued        750 mg 150 mL/hr over 60 Minutes Intravenous Every 12 hours 06/16/20 2115 06/19/20 1322   06/17/20 0015  piperacillin-tazobactam (ZOSYN) IVPB 3.375 g  Status:  Discontinued        3.375 g 12.5 mL/hr over 240 Minutes Intravenous Every 8 hours 06/17/20 0004 06/20/20 1402   06/17/20 0000  piperacillin-tazobactam (ZOSYN) IVPB 3.375 g  Status:  Discontinued        3.375 g 100 mL/hr over 30 Minutes Intravenous Every 6 hours 06/16/20 2329 06/17/20 0003   06/16/20 2200  cefTRIAXone (ROCEPHIN) 2 g in sodium chloride 0.9 % 100 mL IVPB  Status:  Discontinued        2  g 200 mL/hr over 30 Minutes Intravenous Every 24 hours 06/16/20 2101 06/16/20 2329   06/16/20 2115  vancomycin (VANCOREADY) IVPB 1500 mg/300 mL        1,500 mg 150 mL/hr over 120 Minutes Intravenous  Once 06/16/20 2106 06/17/20 0235   06/16/20 1545  piperacillin-tazobactam (ZOSYN) IVPB 3.375 g        3.375 g 100 mL/hr over 30 Minutes Intravenous  Once 06/16/20 1533 06/16/20 1633      Medications:  Scheduled: . (feeding supplement) PROSource Plus  30 mL Oral TID WC  . amoxicillin-clavulanate  1 tablet Oral Q12H  . aspirin EC  81 mg Oral Daily  . benzonatate  100 mg Oral TID  . Chlorhexidine Gluconate Cloth  6 each Topical Daily  . dalfampridine  10 mg Oral BID  . doxycycline  100 mg Oral Q12H  . enoxaparin (LOVENOX) injection  40 mg Subcutaneous Q24H  . ferrous sulfate  325 mg Oral BID  . fluticasone furoate-vilanterol  1 puff Inhalation Daily  . insulin aspart  0-5 Units  Subcutaneous QHS  . insulin aspart  0-9 Units Subcutaneous TID WC  . metoprolol succinate  100 mg Oral Daily  . multivitamin with minerals  1 tablet Oral Daily  . oxybutynin  10 mg Oral Daily  . pantoprazole  40 mg Oral Daily  . Plecanatide  1 tablet Oral Daily  . pravastatin  20 mg Oral q1800  . senna-docusate  1 tablet Oral BID  . tiZANidine  8 mg Oral TID    Objective: Vital signs in last 24 hours: Temp:  [98.4 F (36.9 C)-102.2 F (39 C)] 99.8 F (37.7 C) (02/03 1434) Pulse Rate:  [90-115] 102 (02/03 1434) Resp:  [16-18] 16 (02/03 1434) BP: (102-133)/(57-88) 109/64 (02/03 1434) SpO2:  [88 %-100 %] 100 % (02/03 1434)   General appearance: alert, cooperative and no distress Resp: clear to auscultation bilaterally Cardio: regular rate and rhythm GI: normal findings: bowel sounds normal and soft, non-tender Extremities: edema BLE edema. no corids but she does have tenderness in her calves and LUE PIC is clean.   Lab Results Recent Labs    06/24/20 0517 06/26/20 0450  WBC 12.3* 13.0*  HGB 8.5* 8.9*  HCT 25.8* 28.1*  NA 136 136  K 3.9 3.8  CL 100 104  CO2 26 23  BUN 11 11  CREATININE 0.37* 0.66   Liver Panel Recent Labs    06/26/20 0450  ALBUMIN 2.0*   Sedimentation Rate No results for input(s): ESRSEDRATE in the last 72 hours. C-Reactive Protein No results for input(s): CRP in the last 72 hours.  Microbiology: Recent Results (from the past 240 hour(s))  Urine culture     Status: Abnormal   Collection Time: 06/16/20  8:28 PM   Specimen: In/Out Cath Urine  Result Value Ref Range Status   Specimen Description   Final    IN/OUT CATH URINE Performed at [facility] [address], [place], [place] [address]    Special Requests   Final    NONE Performed at [facility], [place] [address] [place],  [address]    Culture >=100,000 COLONIES/mL ESCHERICHIA COLI (A)  Final   Report Status 06/19/2020 FINAL  Final    Organism ID, Bacteria ESCHERICHIA COLI (A)  Final      Susceptibility   Escherichia coli - MIC*    AMPICILLIN >=32 RESISTANT Resistant     CEFAZOLIN 16 SENSITIVE Sensitive  CEFEPIME <=0.12 SENSITIVE Sensitive     CEFTRIAXONE <=0.25 SENSITIVE Sensitive     CIPROFLOXACIN >=4 RESISTANT Resistant     GENTAMICIN <=1 SENSITIVE Sensitive     IMIPENEM <=0.25 SENSITIVE Sensitive     NITROFURANTOIN 32 SENSITIVE Sensitive     TRIMETH/SULFA >=320 RESISTANT Resistant     AMPICILLIN/SULBACTAM >=32 RESISTANT Resistant     PIP/TAZO <=4 SENSITIVE Sensitive     * >=100,000 COLONIES/mL ESCHERICHIA COLI  Surgical pcr screen     Status: Abnormal   Collection Time: 06/19/20  8:26 AM   Specimen: Nasal Mucosa; Nasal Swab  Result Value Ref Range Status   MRSA, PCR NEGATIVE NEGATIVE Final   Staphylococcus aureus POSITIVE (A) NEGATIVE Final    Comment: (NOTE) The Xpert SA Assay (FDA approved for NASAL specimens in patients 54 years of age and older), is one component of a comprehensive surveillance program. It is not intended to diagnose infection nor to guide or monitor treatment. Performed at [facility], [place] [address] [place], [place] [address]   Culture, blood (routine x 2)     Status: None   Collection Time: 06/20/20  8:11 AM   Specimen: BLOOD  Result Value Ref Range Status   Specimen Description   Final    BLOOD BLOOD RIGHT FOREARM Performed at [facility] [address], [place], [place] [address]    Special Requests   Final    BOTTLES DRAWN AEROBIC ONLY Blood Culture adequate volume Performed at [facility] [address] [place], [place] [address]    Culture   Final    NO GROWTH 5 DAYS Performed at [facility], [place] [address] [place], [place] [address]    Report Status 06/25/2020 FINAL  Final  Culture, blood (routine x 2)     Status: None   Collection Time: 06/20/20  8:12 AM   Specimen: BLOOD RIGHT HAND  Result  Value Ref Range Status   Specimen Description   Final    BLOOD RIGHT HAND Performed at [facility] [address] [place], [place] [address]    Special Requests   Final    BOTTLES DRAWN AEROBIC ONLY Blood Culture adequate volume Performed at [facility] [address] [place], [place] [address]    Culture   Final    NO GROWTH 5 DAYS Performed at [facility], [place] [address] [place], [place] [address]    Report Status 06/25/2020 FINAL  Final    Studies/Results: DG CHEST PORT 1 VIEW  Result Date: 06/26/2020 CLINICAL DATA:  Fever EXAM: PORTABLE CHEST 1 VIEW COMPARISON:  06/24/2020 FINDINGS: There is mild diffuse pulmonary infiltrate again seen throughout the left lung, likely infectious in the acute setting, similar to that noted on prior examination. Lung volumes are low, however, are stable since prior examination. No pneumothorax or pleural effusion. Cardiac size within normal limits. No acute bone abnormality. IMPRESSION: Stable examination. Diffuse pulmonary infiltrate within the left lung, likely infectious. Mild pulmonary hypoinflation. Electronically Signed   By: Fidela Salisbury MD   On: 06/26/2020 08:36     Assessment/Plan: Sepsis- resolved New fever in hospital Anemia, melena Stage IV sacral decub fever  Sacral ulcer s/p I&D 1/27 to remove necrotic skin/fascia//muscle E coli UCx Bacteroides species bsi Dm2 (A1C 6.5%)   Total days of antibiotics: 9 (augmentin/doxy)  Change her to zosyn (for aspiration) Recheck her BCx Consider LE doppler Bilateral Low threshold to pull [facility]  if fever persists Family asks about having colostomy and supra-pubic catheter Repeat UCx pending Per nursing, she has not had BM today. Prev CT showed impaction. Will check KUB. Her h/h has not changed since 24h after adm. I drops, further fever, could consider mesenteric ischemia?         Bobby Rumpf MD, FACP Infectious Diseases (pager)  [phone] [email or web address] 06/26/2020, 4:10 PM  LOS: 10 days

## 2020-06-26 NOTE — Progress Notes (Signed)
Pharmacy Antibiotic Note  Tina Griffith is a 60 y.o. female admitted on 06/16/2020 with sepsis due to sacral wound and received multiple antibiotics.  Pharmacy has been consulted for Zosyn dosing for aspiration pneumonia.  Plan: Zosyn 3.375gm IV q8h (4hr extended infusions) Follow up renal function & cultures  Height: 5\' 5"  (165.1 cm) Weight: 81.8 kg (180 lb 6.4 oz) IBW/kg (Calculated) : 57  Temp (24hrs), Avg:99.8 F (37.7 C), Min:98.4 F (36.9 C), Max:102.2 F (39 C)  Recent Labs  Lab 06/21/20 1048 06/22/20 0931 06/23/20 0607 06/24/20 0517 06/26/20 0450  WBC 13.2* 11.5* 13.7* 12.3* 13.0*  CREATININE 0.61 0.54 0.57 0.37* 0.66    Estimated Creatinine Clearance: 80 mL/min (by C-G formula based on SCr of 0.66 mg/dL).    Allergies  Allergen Reactions  . No Known Allergies     Antimicrobials this admission: 1/24 Zosyn >>1/28 1/28 Ertapenem >> 1/30 1/24 Vanc>>1/27 1/27 doxy>>1/28 , po 1/30 >> 2/3 1/28 Dapto >> 1/30 1/31 Augmentin >> 2/3 2/3 Zosyn >>  Dose adjustments this admission:  Microbiology results: 1/24BCx2: 1/4 anaerobic GNR, no ID - Bacteroides ovatus, BL+ 1/24UCx: > 100K E coli, R amp/Unasyn/cipro/septra, sens to ancef/cefepime/CTX/gent/imi/NTF/piptazo 1/24 HIV NR 1/27 MRSA/SA: neg/positive 1/28 BCx2: ng-final  Thank you for allowing pharmacy to be a part of this patient's care.  Peggyann Juba, PharmD, BCPS Pharmacy: [phone] 06/26/2020 4:44 PM

## 2020-06-26 NOTE — Progress Notes (Signed)
PROGRESS NOTE    Tina Griffith  [id number] DOB: 08/20/1960 DOA: 06/16/2020 PCP: [facility]    Chief Complaint  Patient presents with  . Wound Infection  . Fatigue    Brief Narrative:  Tina Griffith is a 60 y.o. female with PMH significant for multiple sclerosis, movement disorder, hypertension, constipation and COVID-19 pneumonia in December 2021.  At baseline, she was using a walker and wheelchair up until [facility] admission.  Since then she has remained bedbound at home and has developed sacral decubitus ulcers. 1/24, patient was brought to the ED with complaint of failure to thrive, worsening of gluteal/sacral pressure sores.  Since last hospitalization 4 weeks ago for Covid pneumonia, patient has had anorexia, weight loss, generalized fatigue and worsening lower extremity edema.  Patient was admitted for sepsis secondary to infected sacral decubitus ulcer. 1/27, patient underwent debridement of sacral decubitus ulcer by Dr. Harlow Asa.  Infectious disease consultation was obtained.  Patient was initially placed on broad-spectrum IV antibiotics.  Currently on 2 weeks course of oral antibiotics. General surgery and infectious disease signed off. Currently pending placement.   Assessment & Plan:   Principal Problem:   Sepsis with acute organ dysfunction without septic shock (HCC) Active Problems:   Multiple sclerosis (HCC)   Chronic kidney disease   Decubital ulcer   Essential (primary) hypertension   Hypomagnesemia   Anterior optic neuritis   Hemiplegia, spastic (HCC)   Leucocytosis   UTI (urinary tract infection)   Wound infection   Malnutrition of moderate degree   Anemia of chronic disease   Fever  1 sepsis secondary to infected stage IV sacral decubitus ulcer/blood cultures positive for Bacteroides of ovatus Patient presented with stage IV sacral decubitus ulcer.  Seen in consultation by wound care nurse and general surgery.  Status post debridement of  sacral decubitus ulcer per general surgery 06/19/2020. 1/2 blood cultures from 06/16/2020 + for bacteriodes ovatus.  Repeat blood cultures 06/20/2020 negative to date. -ID consulted and was following initially patient was on IV ertapenem and subsequently IV daptomycin.  Patient transitioned to oral Augmentin and doxycycline for total of 2 weeks through 07/01/2020.  Leukocytosis was trending down but trending back up.  Repeat urine cultures pending.  Repeat chest x-ray done concerning for left-sided infiltrate.  Continue current antibiotics.  We will have ID reassess patient.  Will need outpatient follow-up with general surgery and ID.  2.  E. coli UTI Was on IV ertapenem as well as IV daptomycin.  Continue Augmentin and doxycycline as recommended per ID.  3.  Fever Patient noted to have a temp of 102.2 overnight.  Repeat UA with cultures and sensitivities.  Repeat chest x-ray done this morning concerning for left-sided infiltrate which was not there on admission chest x-ray.  Patient currently on Augmentin and doxycycline.  Due to ongoing fevers will consult with ID for further evaluation and management. ??  Repeat CT abdomen and pelvis to evaluate stage IV sacral decubitus ulcer.  Will defer repeat blood cultures until patient has been reassessed by ID.  Supportive care.  Follow.  4.  New oxygen dependence Patient noted to have new O2 requirements on 4 L nasal cannula.  Patient yesterday noted to have sats of 95% on room air.  Currently on 2 L nasal cannula.  Patient noted to spike a temperature of 102 overnight chest x-ray done this morning concerning for pneumonia.  Currently on oral antibiotics.  Follow.    5.  Multiple sclerosis Stable.  Continue home  regimen of baclofen, dalfampridine, ocrelizumab.  Outpatient follow-up  6.  Pain management Continue current regimen of tramadol as needed, OxyIR as needed, Dilaudid as needed.  Minimize pain medication.  Follow.  7.  Hypertension Stable on current  regimen of metoprolol.  Continue to hold HCTZ.  Outpatient follow-up.   8.  Well-controlled diabetes mellitus type 2 Hemoglobin A1c 6.5 (06/18/2020).  CBG 107 this morning.  Continue to hold Victoza.  Sliding scale insulin.  9.  Elevated troponin Patient noted to have elevated troponin up to 112 which trended down.  Patient asymptomatic.  2D echo with EF of 60 to 65%, mild LVH.  Continue aspirin and statin.  Outpatient follow-up.  10.  Hypokalemia/hypomagnesemia/hypophosphatemia Potassium currently at 3.8.  Magnesium at 1.7.  Phosphorus at 3.0.  We will give magnesium sulfate 2 g IV x1.  Keep magnesium > 2.   11.  History of IBS Trulance and PPI.  12.  Anemia of chronic disease/melena Patient noted to have a hemoglobin of 11.1 on admission which trended down and currently stabilized at 8.9.  Patient noted to have had a few episodes of melena however no recurrence.  Patient with no source of bleeding.  Continue PPI.  Follow H&H.  Transfusion threshold < 7.   DVT prophylaxis: Lovenox Code Status: Full Family Communication: Updated patient.  No family at bedside. Disposition:   Status is: Inpatient    Dispo: The patient is from: Home              Anticipated d/c is to: SNF              Anticipated d/c date is: 3 to 4 days.              Patient currently with fevers of 102 overnight and currently not medically stable for discharge.  Once afebrile for 48 hours with continued improvement will be deemed stable for discharge.    Difficult to place patient undetermined       Consultants:   Infectious disease: Dr. West Bali 06/20/2020  Wound care  General surgery: Dr.Gerkin 06/18/2021  Procedures:   CT abdomen and pelvis 06/16/2020  Chest x-ray 06/16/2020, 06/24/2020, 06/26/2020  2D echo 06/18/2020  Sharp debridement of stage IV sacral decubitus ulcer with removal of necrotic skin, adipose tissue, fascia and muscle.  Approximate amount of tissue debridement is 15 cm x 10 cm x 5 cm  per Dr. Harlow Asa general surgery 06/19/2020  Antimicrobials:   Augmentin 06/22/2020>>>> 07/01/2020  Doxycycline 06/22/2020>>>>> 07/01/2020  Doxycycline 06/19/2020>>>> 06/20/2020  IV daptomycin 06/20/2020>>>> 06/22/2020  IV Invanz 06/20/2020>>>> 06/22/2020  IV Zosyn 06/17/2020>>>> 06/20/2020  IV vancomycin 06/16/2020>>>>> 06/19/2020   Subjective: Sleeping but arousable.  He did was up all night.  Patient noted to have a T-max of 102 overnight.  Denies any chest pain.  Denies any significant shortness of breath.   Objective: Vitals:   06/26/20 0605 06/26/20 1034 06/26/20 1130 06/26/20 1434  BP: 127/78 129/74  109/64  Pulse: 96 (!) 103  (!) 102  Resp: 18 17  16   Temp: 98.4 F (36.9 C) 99.1 F (37.3 C)  99.8 F (37.7 C)  TempSrc: Oral Oral  Oral  SpO2: 99% 100% 97% 100%  Weight:      Height:        Intake/Output Summary (Last 24 hours) at 06/26/2020 1525 Last data filed at 06/26/2020 1400 Gross per 24 hour  Intake 890 ml  Output 1350 ml  Net -460 ml   [employer]  06/17/20 1145 06/18/20 0500 06/19/20 0844  Weight: 74.8 kg 81.8 kg 81.8 kg    Examination:  General exam: NAD Respiratory system: Some decreased breath sounds in the bases.  CTA B anterior lung fields.  No wheezing.  Fair air movement.  Cardiovascular system: Regular rate rhythm no murmurs rubs or gallops.  No JVD.  No lower extremity edema.  Gastrointestinal system: Abdomen is soft, nontender, nondistended, positive bowel sounds.  No rebound.  No guarding. Central nervous system: Alert and oriented. No focal neurological deficits. Extremities: Symmetric 5 x 5 power. Skin: No rashes, lesions or ulcers Psychiatry: Judgement and insight appear normal. Mood & affect appropriate.     Data Reviewed: I have personally reviewed following labs and imaging studies  CBC: Recent Labs  Lab 06/21/20 1048 06/22/20 0931 06/23/20 0607 06/24/20 0517 06/26/20 0450  WBC 13.2* 11.5* 13.7* 12.3* 13.0*  NEUTROABS 10.6* 8.8*  11.1* 9.7* 9.3*  HGB 8.6* 9.3* 7.9* 8.5* 8.9*  HCT 27.0* 28.5* 24.4* 25.8* 28.1*  MCV 81.6 80.5 80.0 81.9 83.9  PLT 427* 457* 507* 545* 565*    Basic Metabolic Panel: Recent Labs  Lab 06/20/20 0546 06/21/20 1048 06/22/20 0931 06/23/20 0607 06/24/20 0517 06/26/20 0450  NA 135 135 136 133* 136 136  K 4.1 3.6 3.7 4.0 3.9 3.8  CL 102 101 100 98 100 104  CO2 23 23 24 25 26 23   GLUCOSE 100* 121* 89 105* 102* 111*  BUN 8 7 8 9 11 11   CREATININE 0.57 0.61 0.54 0.57 0.37* 0.66  CALCIUM 8.4* 8.5* 8.6* 8.4* 8.6* 8.7*  MG  --  1.4* 1.4*  --  1.7 1.7  PHOS 2.3* 2.5 2.1*  --  2.2* 3.0    GFR: Estimated Creatinine Clearance: 80 mL/min (by C-G formula based on SCr of 0.66 mg/dL).  Liver Function Tests: Recent Labs  Lab 06/26/20 0450  ALBUMIN 2.0*    CBG: Recent Labs  Lab 06/25/20 1129 06/25/20 1636 06/25/20 2133 06/26/20 0743 06/26/20 1204  GLUCAP 112* 105* 111* 107* 118*     Recent Results (from the past 240 hour(s))  Blood Culture (routine x 2)     Status: Abnormal   Collection Time: 06/16/20  3:32 PM   Specimen: BLOOD  Result Value Ref Range Status   Specimen Description   Final    BLOOD RIGHT ANTECUBITAL Performed at [facility], [place] [address] [place], [place] [address]    Special Requests   Final    BOTTLES DRAWN AEROBIC AND ANAEROBIC Blood Culture adequate volume Performed at [place] [address] [place], [place] [address]    Culture  Setup Time   Final    ANAEROBIC BOTTLE ONLY GRAM NEGATIVE RODS Organism ID to follow CRITICAL RESULT CALLED TO, READ BACK BY AND VERIFIED WITH: E JACKSON PHARMD 06/20/20 0040 JDW    Culture (A)  Final    BACTEROIDES OVATUS BETA LACTAMASE POSITIVE Performed at [facility], [place] [address] [place], [place] [address]    Report Status 06/21/2020 FINAL  Final  Blood Culture ID Panel (Reflexed)     Status: None   Collection Time: 06/16/20  3:32 PM  Result Value Ref Range  Status   Enterococcus faecalis NOT DETECTED NOT DETECTED Final   Enterococcus Faecium NOT DETECTED NOT DETECTED Final   Listeria monocytogenes NOT DETECTED NOT DETECTED Final   Staphylococcus species NOT DETECTED NOT DETECTED Final   Staphylococcus aureus (BCID) NOT DETECTED NOT DETECTED Final  Staphylococcus epidermidis NOT DETECTED NOT DETECTED Final   Staphylococcus lugdunensis NOT DETECTED NOT DETECTED Final   Streptococcus species NOT DETECTED NOT DETECTED Final   Streptococcus agalactiae NOT DETECTED NOT DETECTED Final   Streptococcus pneumoniae NOT DETECTED NOT DETECTED Final   Streptococcus pyogenes NOT DETECTED NOT DETECTED Final   A.calcoaceticus-baumannii NOT DETECTED NOT DETECTED Final   Bacteroides fragilis NOT DETECTED NOT DETECTED Final   Enterobacterales NOT DETECTED NOT DETECTED Final   Enterobacter cloacae complex NOT DETECTED NOT DETECTED Final   Escherichia coli NOT DETECTED NOT DETECTED Final   Klebsiella aerogenes NOT DETECTED NOT DETECTED Final   Klebsiella oxytoca NOT DETECTED NOT DETECTED Final   Klebsiella pneumoniae NOT DETECTED NOT DETECTED Final   Proteus species NOT DETECTED NOT DETECTED Final   Salmonella species NOT DETECTED NOT DETECTED Final   Serratia marcescens NOT DETECTED NOT DETECTED Final   Haemophilus influenzae NOT DETECTED NOT DETECTED Final   Neisseria meningitidis NOT DETECTED NOT DETECTED Final   Pseudomonas aeruginosa NOT DETECTED NOT DETECTED Final   Stenotrophomonas maltophilia NOT DETECTED NOT DETECTED Final   Candida albicans NOT DETECTED NOT DETECTED Final   Candida auris NOT DETECTED NOT DETECTED Final   Candida glabrata NOT DETECTED NOT DETECTED Final   Candida krusei NOT DETECTED NOT DETECTED Final   Candida parapsilosis NOT DETECTED NOT DETECTED Final   Candida tropicalis NOT DETECTED NOT DETECTED Final   Cryptococcus neoformans/gattii NOT DETECTED NOT DETECTED Final    Comment: Performed at [facility], [address], [place], [place] [address]  Blood Culture (routine x 2)     Status: None   Collection Time: 06/16/20  4:00 PM   Specimen: BLOOD RIGHT HAND  Result Value Ref Range Status   Specimen Description   Final    BLOOD RIGHT HAND Performed at [place] [address], [place], [place] [address]    Special Requests   Final    BOTTLES DRAWN AEROBIC AND ANAEROBIC Blood Culture results may not be optimal due to an inadequate volume of blood received in culture bottles Performed at [place] [address], [place], [place] [address]    Culture   Final    NO GROWTH 5 DAYS Performed at [facility], [place] [address] [place], [place] [address]    Report Status 06/21/2020 FINAL  Final  Urine culture     Status: Abnormal   Collection Time: 06/16/20  8:28 PM   Specimen: In/Out Cath Urine  Result Value Ref Range Status   Specimen Description   Final    IN/OUT CATH URINE Performed at [facility] [name] [place], [place] [address]    Special Requests   Final    NONE Performed at [facility], [place] [address] [place], [place] [address]    Culture >=100,000 COLONIES/mL ESCHERICHIA COLI (A)  Final   Report Status 06/19/2020 FINAL  Final   Organism ID, Bacteria ESCHERICHIA COLI (A)  Final      Susceptibility   Escherichia coli - MIC*    AMPICILLIN >=32 RESISTANT Resistant     CEFAZOLIN 16 SENSITIVE Sensitive     CEFEPIME <=0.12 SENSITIVE Sensitive     CEFTRIAXONE <=0.25 SENSITIVE Sensitive     CIPROFLOXACIN >=4 RESISTANT Resistant     GENTAMICIN <=1 SENSITIVE Sensitive     IMIPENEM <=0.25 SENSITIVE Sensitive     NITROFURANTOIN 32 SENSITIVE Sensitive     TRIMETH/SULFA >=320 RESISTANT Resistant  AMPICILLIN/SULBACTAM >=32 RESISTANT Resistant     PIP/TAZO <=4 SENSITIVE Sensitive     * >=100,000 COLONIES/mL ESCHERICHIA COLI  Surgical pcr screen     Status: Abnormal   Collection Time: 06/19/20   8:26 AM   Specimen: Nasal Mucosa; Nasal Swab  Result Value Ref Range Status   MRSA, PCR NEGATIVE NEGATIVE Final   Staphylococcus aureus POSITIVE (A) NEGATIVE Final    Comment: (NOTE) The Xpert SA Assay (FDA approved for NASAL specimens in patients 39 years of age and older), is one component of a comprehensive surveillance program. It is not intended to diagnose infection nor to guide or monitor treatment. Performed at [facility], [place] [address], [place], [place] [address]   Culture, blood (routine x 2)     Status: None   Collection Time: 06/20/20  8:11 AM   Specimen: BLOOD  Result Value Ref Range Status   Specimen Description   Final    BLOOD BLOOD RIGHT FOREARM Performed at [place] [address] [place], [place] [address]    Special Requests   Final    BOTTLES DRAWN AEROBIC ONLY Blood Culture adequate volume Performed at [place] [address], [place], [place] [address]    Culture   Final    NO GROWTH 5 DAYS Performed at [facility], [place] [address], [place], [place] [address]    Report Status 06/25/2020 FINAL  Final  Culture, blood (routine x 2)     Status: None   Collection Time: 06/20/20  8:12 AM   Specimen: BLOOD RIGHT HAND  Result Value Ref Range Status   Specimen Description   Final    BLOOD RIGHT HAND Performed at [place] [address] [place], [place] [address]    Special Requests   Final    BOTTLES DRAWN AEROBIC ONLY Blood Culture adequate volume Performed at [place] [address], [place], [place] [address]    Culture   Final    NO GROWTH 5 DAYS Performed at [facility], [place] [address] [place],  [address]    Report Status 06/25/2020 FINAL  Final         Radiology Studies: DG CHEST PORT 1 VIEW  Result Date: 06/26/2020 CLINICAL DATA:  Fever EXAM: PORTABLE CHEST 1 VIEW COMPARISON:  06/24/2020 FINDINGS: There  is mild diffuse pulmonary infiltrate again seen throughout the left lung, likely infectious in the acute setting, similar to that noted on prior examination. Lung volumes are low, however, are stable since prior examination. No pneumothorax or pleural effusion. Cardiac size within normal limits. No acute bone abnormality. IMPRESSION: Stable examination. Diffuse pulmonary infiltrate within the left lung, likely infectious. Mild pulmonary hypoinflation. Electronically Signed   By: Fidela Salisbury MD   On: 06/26/2020 08:36        Scheduled Meds: . (feeding supplement) PROSource Plus  30 mL Oral TID WC  . amoxicillin-clavulanate  1 tablet Oral Q12H  . aspirin EC  81 mg Oral Daily  . benzonatate  100 mg Oral TID  . Chlorhexidine Gluconate Cloth  6 each Topical Daily  . dalfampridine  10 mg Oral BID  . doxycycline  100 mg Oral Q12H  . enoxaparin (LOVENOX) injection  40 mg Subcutaneous Q24H  . ferrous sulfate  325 mg Oral BID  . fluticasone furoate-vilanterol  1 puff Inhalation Daily  . insulin aspart  0-5 Units Subcutaneous QHS  . insulin aspart  0-9 Units Subcutaneous TID WC  . metoprolol succinate  100 mg Oral Daily  . multivitamin with minerals  1 tablet Oral Daily  . oxybutynin  10 mg Oral Daily  . pantoprazole  40 mg Oral Daily  . Plecanatide  1 tablet Oral Daily  . pravastatin  20 mg Oral q1800  . senna-docusate  1 tablet Oral BID  . tiZANidine  8 mg Oral TID   Continuous Infusions:   LOS: 10 days    Time spent: 40 minutes    Irine Seal, MD [facility]   To contact the attending provider between 7A-7P or the covering provider during after hours 7P-7A, please log into the web site [email or web address] and access using universal [place] password for that web site. If you do not have the password, please call the hospital operator.  06/26/2020, 3:25 PM

## 2020-06-27 DIAGNOSIS — D638 Anemia in other chronic diseases classified elsewhere: Secondary | ICD-10-CM | POA: Diagnosis not present

## 2020-06-27 DIAGNOSIS — R6520 Severe sepsis without septic shock: Secondary | ICD-10-CM | POA: Diagnosis not present

## 2020-06-27 DIAGNOSIS — G35 Multiple sclerosis: Secondary | ICD-10-CM | POA: Diagnosis not present

## 2020-06-27 DIAGNOSIS — L89154 Pressure ulcer of sacral region, stage 4: Secondary | ICD-10-CM | POA: Diagnosis not present

## 2020-06-27 DIAGNOSIS — I10 Essential (primary) hypertension: Secondary | ICD-10-CM | POA: Diagnosis not present

## 2020-06-27 DIAGNOSIS — A419 Sepsis, unspecified organism: Secondary | ICD-10-CM | POA: Diagnosis not present

## 2020-06-27 LAB — CBC WITH DIFFERENTIAL/PLATELET
Abs Immature Granulocytes: 0.16 10*3/uL — ABNORMAL HIGH (ref 0.00–0.07)
Basophils Absolute: 0.1 10*3/uL (ref 0.0–0.1)
Basophils Relative: 1 %
Eosinophils Absolute: 0.4 10*3/uL (ref 0.0–0.5)
Eosinophils Relative: 3 %
HCT: 26.5 % — ABNORMAL LOW (ref 36.0–46.0)
Hemoglobin: 8.5 g/dL — ABNORMAL LOW (ref 12.0–15.0)
Immature Granulocytes: 1 %
Lymphocytes Relative: 14 %
Lymphs Abs: 2 10*3/uL (ref 0.7–4.0)
MCH: 26.2 pg (ref 26.0–34.0)
MCHC: 32.1 g/dL (ref 30.0–36.0)
MCV: 81.5 fL (ref 80.0–100.0)
Monocytes Absolute: 1 10*3/uL (ref 0.1–1.0)
Monocytes Relative: 7 %
Neutro Abs: 10.6 10*3/uL — ABNORMAL HIGH (ref 1.7–7.7)
Neutrophils Relative %: 74 %
Platelets: 643 10*3/uL — ABNORMAL HIGH (ref 150–400)
RBC: 3.25 MIL/uL — ABNORMAL LOW (ref 3.87–5.11)
RDW: 19.9 % — ABNORMAL HIGH (ref 11.5–15.5)
WBC: 14.3 10*3/uL — ABNORMAL HIGH (ref 4.0–10.5)
nRBC: 0 % (ref 0.0–0.2)

## 2020-06-27 LAB — RENAL FUNCTION PANEL
Albumin: 2.1 g/dL — ABNORMAL LOW (ref 3.5–5.0)
Anion gap: 14 (ref 5–15)
BUN: 10 mg/dL (ref 6–20)
CO2: 23 mmol/L (ref 22–32)
Calcium: 8.8 mg/dL — ABNORMAL LOW (ref 8.9–10.3)
Chloride: 99 mmol/L (ref 98–111)
Creatinine, Ser: 0.55 mg/dL (ref 0.44–1.00)
GFR, Estimated: >60 mL/min (ref >60)
Glucose, Bld: 113 mg/dL — ABNORMAL HIGH (ref 70–99)
Phosphorus: 2.8 mg/dL (ref 2.5–4.6)
Potassium: 4 mmol/L (ref 3.5–5.1)
Sodium: 136 mmol/L (ref 135–145)

## 2020-06-27 LAB — MAGNESIUM: Magnesium: 1.7 mg/dL (ref 1.7–2.4)

## 2020-06-27 LAB — GLUCOSE, CAPILLARY
Glucose-Capillary: 108 mg/dL — ABNORMAL HIGH (ref 70–99)
Glucose-Capillary: 105 mg/dL — ABNORMAL HIGH (ref 70–99)
Glucose-Capillary: 117 mg/dL — ABNORMAL HIGH (ref 70–99)
Glucose-Capillary: 103 mg/dL — ABNORMAL HIGH (ref 70–99)

## 2020-06-27 MED ORDER — SORBITOL 70 % SOLN
960.0000 mL | TOPICAL_OIL | Freq: Once | ORAL | Status: DC
  Filled 2020-06-27: qty 473

## 2020-06-27 MED ORDER — MAGNESIUM SULFATE 4 GM/100ML IV SOLN
4.0000 g | Freq: Once | INTRAVENOUS | Status: AC
  Administered 2020-06-27: 4 g via INTRAVENOUS
  Filled 2020-06-27: qty 100

## 2020-06-27 NOTE — Evaluation (Signed)
Clinical/Bedside Swallow Evaluation Patient Details  Name: Tina Griffith MRN: [id number] Date of Birth: [birth year]  Today's Date: 06/27/2020 Time: SLP Start Time (ACUTE ONLY): 5176 SLP Stop Time (ACUTE ONLY): 1612 SLP Time Calculation (min) (ACUTE ONLY): 24 min  Past Medical History:  Past Medical History:  Diagnosis Date  . Constipation   . Hypertension   . Movement disorder   . MS (multiple sclerosis) ([place])   . Vision abnormalities    Past Surgical History:  Past Surgical History:  Procedure Laterality Date  . COLOSTOMY    . COLOSTOMY REVERSAL    . HERNIA REPAIR    . KNEE SURGERY    . WOUND DEBRIDEMENT Right 06/19/2020   Procedure: DEBRIDEMENT OF SACRAL WOUND;  Surgeon: Armandina Gemma, MD;  Location: [facility] ORS;  Service: General;  Laterality: Right;   HPI:  60 y.o. African-American female presented to the [facility] Emergency Department via EMS with complaints of failure to thrive and worsening of gluteal/sacral pressure sores.  The patient's daughter is at the bedside and is able to help in providing medical history.  The patient was recently discharged from a hospitalization for treatment of COVID-19 pneumonia at the end of December 2021, during which time she developed her pressure sores.  She was discharged home but has experienced generalized fatigue, anorexia and increasing lower extremity edema. 06/26/20 CXR indicated Stable examination. Diffuse pulmonary infiltrate within the left lung, likely infectious. Mild pulmonary hypoinflation; BSE generated to r/o dysphagia.  Assessment / Plan / Recommendation Clinical Impression  Pt seen for clinical swallowing evaluation with consistencies administered including thin via straw, puree and solids with impaired mastication noted d/t missing/poor dentition primarily with prolonged oral transit noted.  Nursing informed SLP pt able to take medication with puree/whole without difficulty noted.  Generalized weakness observed  during OME, but functional for swallowing.  No overt s/s of aspiration noted, but d/t pt's dx of MS, generalized weakness and recent CXR, ST will f/u x1 for diet tolerance/education re: swallowing safety while in acute setting.  Recommend continuing with current diet of Dysphagia 3/thin liquids.Thank you for this consult. SLP Visit Diagnosis: Dysphagia, unspecified (R13.10)    Aspiration Risk  Mild aspiration risk    Diet Recommendation   Dysphagia 3/thin liquids  Medication Administration: Whole meds with puree    Other  Recommendations Oral Care Recommendations: Oral care BID   Follow up Recommendations Other (comment) (TBD)      Frequency and Duration min 1 x/week  1 week       Prognosis Prognosis for Safe Diet Advancement: Good      Swallow Study   General Date of Onset: 06/18/20 HPI: 60 y.o. African-American female presented to the [facility] Emergency Department via EMS with complaints of failure to thrive and worsening of gluteal/sacral pressure sores.  The patient's daughter is at the bedside and is able to help in providing medical history.  The patient was recently discharged from a hospitalization for treatment of COVID-19 pneumonia at the end of December 2021, during which time she developed her pressure sores.  She was discharged home but has experienced generalized fatigue, anorexia and increasing lower extremity edema. Type of Study: Bedside Swallow Evaluation Previous Swallow Assessment: n/a Diet Prior to this Study: Dysphagia 3 (soft);Thin liquids Temperature Spikes Noted: Yes (low grade (99)) Respiratory Status: Nasal cannula History of Recent Intubation: No Behavior/Cognition: Alert;Cooperative Oral Cavity Assessment: Within Functional Limits Oral Care Completed by SLP: Recent completion by staff Oral  Cavity - Dentition: Missing dentition;Poor condition Vision: Functional for self-feeding Self-Feeding Abilities: Able to feed self;Needs  assist Patient Positioning: Other (comment) (Upright as high as tolerated d/t pain (~45 degrees)) Baseline Vocal Quality: Low vocal intensity Volitional Cough: Strong Volitional Swallow: Able to elicit    Oral/Motor/Sensory Function Overall Oral Motor/Sensory Function: Generalized oral weakness   Ice Chips Ice chips: Not tested   Thin Liquid Thin Liquid: Impaired Presentation: Cup;Straw Pharyngeal  Phase Impairments: Throat Clearing - Delayed Other Comments: Nursing reported intermittent coughing during medication admin/meals    Nectar Thick Nectar Thick Liquid: Not tested   Honey Thick Honey Thick Liquid: Not tested   Puree Puree: Within functional limits Presentation: Spoon   Solid     Solid: Impaired Presentation: Spoon Oral Phase Impairments: Impaired mastication Oral Phase Functional Implications: Impaired mastication      Elvina Sidle, M.S., CCC-SLP 06/27/2020,5:04 PM

## 2020-06-27 NOTE — TOC Progression Note (Signed)
Transition of Care [facility]) - Progression Note    Patient Details  Name: Natsha Guidry MRN: [id number] Date of Birth: January 20, 1961  Transition of [facility] CM/SW Contact  Lennart Pall, LCSW Phone Number: 06/27/2020, 1:58 PM  Clinical Narrative:     Pt not currently medically ready to transition to SNF.  Have touched base with daughter, [name], who is aware.  Will resubmit SNF referral when pt medically cleared.  TOC to continue to follow.  Expected Discharge Plan: [place] (vs home with [facility]) Barriers to Discharge: Continued Medical Work up  Expected Discharge Plan and Services Expected Discharge Plan: [place] (vs home with [facility]) In-house Referral: Clinical Social Work     Living arrangements for the past 2 months: Single Family Home                                       Social Determinants of Health (SDOH) Interventions    Readmission Risk Interventions Readmission Risk Prevention Plan 06/18/2020  Transportation Screening Complete  PCP or Specialist Appt within 5-7 Days Complete  Home Care Screening Complete  Medication Review (RN CM) Complete  Some recent data might be hidden

## 2020-06-27 NOTE — Progress Notes (Signed)
PROGRESS NOTE    Tina Griffith  [id number] DOB: 1960/12/07 DOA: 06/16/2020 PCP: [facility]    Chief Complaint  Patient presents with  . Wound Infection  . Fatigue    Brief Narrative:  Tina Griffith is a 60 y.o. female with PMH significant for multiple sclerosis, movement disorder, hypertension, constipation and COVID-19 pneumonia in December 2021.  At baseline, she was using a walker and wheelchair up until [place] admission.  Since then she has remained bedbound at home and has developed sacral decubitus ulcers. 1/24, patient was brought to the ED with complaint of failure to thrive, worsening of gluteal/sacral pressure sores.  Since last hospitalization 4 weeks ago for Covid pneumonia, patient has had anorexia, weight loss, generalized fatigue and worsening lower extremity edema.  Patient was admitted for sepsis secondary to infected sacral decubitus ulcer. 1/27, patient underwent debridement of sacral decubitus ulcer by Dr. Harlow Asa.  Infectious disease consultation was obtained.  Patient was initially placed on broad-spectrum IV antibiotics.  Currently on 2 weeks course of oral antibiotics. General surgery and infectious disease signed off. Currently pending placement.   Assessment & Plan:   Principal Problem:   Sepsis with acute organ dysfunction without septic shock (HCC) Active Problems:   Multiple sclerosis (HCC)   Chronic kidney disease   Decubital ulcer   Essential (primary) hypertension   Hypomagnesemia   Anterior optic neuritis   Hemiplegia, spastic (HCC)   Leucocytosis   UTI (urinary tract infection)   Wound infection   Malnutrition of moderate degree   Anemia of chronic disease   Fever   Hypoxia   Impaction of colon ([place])  1 sepsis secondary to infected stage IV sacral decubitus ulcer/blood cultures positive for Bacteroides of ovatus Patient presented with stage IV sacral decubitus ulcer.  Seen in consultation by wound care nurse and general  surgery.  Status post debridement of sacral decubitus ulcer per general surgery 06/19/2020. 1/2 blood cultures from 06/16/2020 + for bacteriodes ovatus.  Repeat blood cultures 06/20/2020 negative to date. -ID consulted and was following initially patient was on IV ertapenem and subsequently IV daptomycin.  Patient transitioned to oral Augmentin and doxycycline for total of 2 weeks through 07/01/2020.   Leukocytosis was trending down but trending back up.  Repeat urinalysis unremarkable.  Urine cultures pending.  Repeat blood cultures pending.  Repeat chest x-ray done concerning for left-sided infiltrate.  Was on Augmentin and doxy which have been discontinued and patient on IV Zosyn per ID recommendations due to ongoing fevers.  ID following and appreciate input and recommendations.  Will need outpatient follow-up with general surgery and ID on discharge.   2.  E. coli UTI Was on IV ertapenem as well as IV daptomycin.  Was on Augmentin and doxycycline as recommended per ID, however due to ongoing fevers IV antibiotics were changed to IV Zosyn.  Follow.   3.  Fever Patient noted to have a temp of 102.2 (06/25/2020).  Repeat UA unremarkable.  Repeat chest x-ray done concerning for left-sided infiltrate which was not there on admission chest x-ray.  Patient was on Augmentin and doxycycline.  Due to ongoing fevers ID consulted, abdominal films obtained concerning for constipation.  Blood cultures ordered and currently pending.  IV antibiotics have been changed and patient currently on IV Zosyn.  Currently afebrile.  Continue supportive care.  ID following and appreciate their input and recommendations.   4.  New oxygen dependence Patient noted to have new O2 requirements on 4 L nasal cannula.  Patient noted to have sats of 95% on room air on 06/25/2020..  Currently on 2 L nasal cannula with sats of 100%.  Patient noted to spike a temperature of 102 the evening of 06/25/2020.  Chest x-ray done concerning for pneumonia.   Patient was on oral antibiotics of Augmentin and doxy.  ID consulted antibiotics changed to IV Zosyn.  Currently afebrile.  Follow.  5.  Multiple sclerosis Stable.  Continue home regimen of baclofen, dalfampridine, ocrelizumab.  Outpatient follow-up  6.  Pain management Continue current regimen of tramadol as needed, OxyIR as needed, Dilaudid as needed.  Minimize pain medication.  Follow.  7.  Hypertension Stable on current regimen of metoprolol.  Continue to hold HCTZ.  Outpatient follow-up.   8.  Well-controlled diabetes mellitus type 2 Hemoglobin A1c 6.5 (06/18/2020).  CBG 108 this morning.  Sliding scale insulin.  Continue to hold Victoza.  9.  Elevated troponin Patient noted to have elevated troponin up to 112 which trended down.  Patient asymptomatic.  2D echo with EF of 60 to 65%, mild LVH.  Continue aspirin and statin.  Outpatient follow-up.  10.  Hypokalemia/hypomagnesemia/hypophosphatemia Potassium currently at 4.  Magnesium at 1.7.  Phosphorus at 2.8.  We will give magnesium sulfate 4 g IV x1.  Keep magnesium > 2.   11.  History of IBS Trulance and PPI.  12.  Anemia of chronic disease/melena Patient noted to have a hemoglobin of 11.1 on admission which trended down and currently stabilized at 8.5.  Patient noted to have had a few episodes of melena however no recurrence.  Patient with no source of bleeding.  Continue PPI.  Follow H&H.  Transfusion threshold < 7.  13.  Constipation Smog enema.  Continue Senokot-S twice daily.  May need to be placed on MiraLAX daily.  Follow.   DVT prophylaxis: Lovenox Code Status: Full Family Communication: Updated patient.  No family at bedside. Disposition:   Status is: Inpatient    Dispo: The patient is from: Home              Anticipated d/c is to: SNF              Anticipated d/c date is: 3 to 4 days.              Patient currently with fevers work-up underway, IV antibiotics have been changed, not stable for discharge.     Difficult to place patient undetermined       Consultants:   Infectious disease: Dr. West Bali 06/20/2020  Wound care  General surgery: Dr.Gerkin 06/18/2021  Infectious disease: Dr. Johnnye Sima 06/26/2020  Procedures:   CT abdomen and pelvis 06/16/2020  Chest x-ray 06/16/2020, 06/24/2020, 06/26/2020  2D echo 06/18/2020  Sharp debridement of stage IV sacral decubitus ulcer with removal of necrotic skin, adipose tissue, fascia and muscle.  Approximate amount of tissue debridement is 15 cm x 10 cm x 5 cm per Dr. Harlow Asa general surgery 06/19/2020  KUB 06/26/2020  Antimicrobials:   Augmentin 06/22/2020>>>> 06/26/2020  Doxycycline 06/22/2020>>>>> 06/26/2020  Doxycycline 06/19/2020>>>> 06/20/2020  IV daptomycin 06/20/2020>>>> 06/22/2020  IV Invanz 06/20/2020>>>> 06/22/2020  IV Zosyn 06/17/2020>>>> 06/20/2020  IV vancomycin 06/16/2020>>>>> 06/19/2020  IV Zosyn 06/26/2020>>>>   Subjective: Sleeping but arousable.  Fever curve trended down.  Denies any chest pain no significant shortness of breath.  Objective: Vitals:   06/26/20 2116 06/27/20 0201 06/27/20 0607 06/27/20 0930  BP: 136/62 (!) 111/56 123/65 127/64  Pulse: 97 95 96 (!) 101  Resp: 18  16 18 16   Temp: 99.1 F (37.3 C)  98.9 F (37.2 C) 98 F (36.7 C)  TempSrc: Oral  Oral Oral  SpO2: 100% 97% 100%   Weight:      Height:        Intake/Output Summary (Last 24 hours) at 06/27/2020 1229 Last data filed at 06/27/2020 0943 Gross per 24 hour  Intake 421.69 ml  Output 1000 ml  Net -578.31 ml   Filed Weights   06/17/20 1145 06/18/20 0500 06/19/20 0844  Weight: 74.8 kg 81.8 kg 81.8 kg    Examination:  General exam: NAD Respiratory system: Clear to auscultation anterior lung fields.  No wheezing.  Fair air movement. Cardiovascular system: RRR no murmurs rubs or gallops.  No JVD.  No lower extremity edema. Gastrointestinal system: Abdomen is soft, nontender, nondistended, positive bowel sounds.  No rebound.  No guarding. Central  nervous system: Alert and oriented. No focal neurological deficits. Extremities: Symmetric 5 x 5 power. Skin: No rashes, lesions or ulcers Psychiatry: Judgement and insight appear normal. Mood & affect appropriate.     Data Reviewed: I have personally reviewed following labs and imaging studies  CBC: Recent Labs  Lab 06/22/20 0931 06/23/20 0607 06/24/20 0517 06/26/20 0450 06/27/20 0505  WBC 11.5* 13.7* 12.3* 13.0* 14.3*  NEUTROABS 8.8* 11.1* 9.7* 9.3* 10.6*  HGB 9.3* 7.9* 8.5* 8.9* 8.5*  HCT 28.5* 24.4* 25.8* 28.1* 26.5*  MCV 80.5 80.0 81.9 83.9 81.5  PLT 457* 507* 545* 565* 643*    Basic Metabolic Panel: Recent Labs  Lab 06/21/20 1048 06/22/20 0931 06/23/20 0607 06/24/20 0517 06/26/20 0450 06/27/20 0505  NA 135 136 133* 136 136 136  K 3.6 3.7 4.0 3.9 3.8 4.0  CL 101 100 98 100 104 99  CO2 23 24 25 26 23 23   GLUCOSE 121* 89 105* 102* 111* 113*  BUN 7 8 9 11 11 10   CREATININE 0.61 0.54 0.57 0.37* 0.66 0.55  CALCIUM 8.5* 8.6* 8.4* 8.6* 8.7* 8.8*  MG 1.4* 1.4*  --  1.7 1.7 1.7  PHOS 2.5 2.1*  --  2.2* 3.0 2.8    GFR: Estimated Creatinine Clearance: 80 mL/min (by C-G formula based on SCr of 0.55 mg/dL).  Liver Function Tests: Recent Labs  Lab 06/26/20 0450 06/27/20 0505  ALBUMIN 2.0* 2.1*    CBG: Recent Labs  Lab 06/26/20 1204 06/26/20 1637 06/26/20 2123 06/27/20 0723 06/27/20 1127  GLUCAP 118* 118* 88 108* 105*     Recent Results (from the past 240 hour(s))  Surgical pcr screen     Status: Abnormal   Collection Time: 06/19/20  8:26 AM   Specimen: Nasal Mucosa; Nasal Swab  Result Value Ref Range Status   MRSA, PCR NEGATIVE NEGATIVE Final   Staphylococcus aureus POSITIVE (A) NEGATIVE Final    Comment: (NOTE) The Xpert SA Assay (FDA approved for NASAL specimens in patients 70 years of age and older), is one component of a comprehensive surveillance program. It is not intended to diagnose infection nor to guide or monitor treatment. Performed  at [facility], [place] [address], [place], [place] [address]   Culture, blood (routine x 2)     Status: None   Collection Time: 06/20/20  8:11 AM   Specimen: BLOOD  Result Value Ref Range Status   Specimen Description   Final    BLOOD BLOOD RIGHT FOREARM Performed at [place] [address] [place], [place] [address]    Special Requests  Final    BOTTLES DRAWN AEROBIC ONLY Blood Culture adequate volume Performed at [facility] [address], [place], [place] [address]    Culture   Final    NO GROWTH 5 DAYS Performed at [facility], [place] [address], [place], [place] [address]    Report Status 06/25/2020 FINAL  Final  Culture, blood (routine x 2)     Status: None   Collection Time: 06/20/20  8:12 AM   Specimen: BLOOD RIGHT HAND  Result Value Ref Range Status   Specimen Description   Final    BLOOD RIGHT HAND Performed at [place] [address] [place], [place] [address]    Special Requests   Final    BOTTLES DRAWN AEROBIC ONLY Blood Culture adequate volume Performed at [place] [address], [place], [place] [address]    Culture   Final    NO GROWTH 5 DAYS Performed at [facility], [place] [address], [place], [place] [address]    Report Status 06/25/2020 FINAL  Final  Culture, blood (Routine X 2) w Reflex to ID Panel     Status: None (Preliminary result)   Collection Time: 06/26/20  5:22 PM   Specimen: BLOOD RIGHT HAND  Result Value Ref Range Status   Specimen Description   Final    BLOOD RIGHT HAND Performed at [place] [address] [place], [place] [address]    Special Requests   Final    BOTTLES DRAWN AEROBIC ONLY Blood Culture results may not be optimal due to an inadequate volume of blood received in culture bottles Performed at [facility] [address] [place], [place] [address]     Culture   Final    NO GROWTH < 12 HOURS Performed at [facility] [address] [place], [place] [address]    Report Status PENDING  Incomplete  Culture, blood (Routine X 2) w Reflex to ID Panel     Status: None (Preliminary result)   Collection Time: 06/26/20  5:22 PM   Specimen: BLOOD RIGHT HAND  Result Value Ref Range Status   Specimen Description   Final    BLOOD RIGHT HAND Performed at [facility] [name], [place], [place] [address]    Special Requests   Final    BOTTLES DRAWN AEROBIC ONLY Blood Culture results may not be optimal due to an inadequate volume of blood received in culture bottles Performed at [facility] [address], [place], [place] [address]    Culture   Final    NO GROWTH < 12 HOURS Performed at [facility] [address] [place], [place] [address]    Report Status PENDING  Incomplete         Radiology Studies: DG Abd 1 View  Result Date: 06/26/2020 CLINICAL DATA:  60 year old female with impacted colon. EXAM: ABDOMEN - 1 VIEW COMPARISON:  CT abdomen pelvis dated 06/16/2020. FINDINGS: Stool within the rectal vault similar to prior CT. There is moderate stool in the distal colon. No bowel dilatation or evidence of obstruction. No free air a calculi. The osseous structures are intact. IMPRESSION: Stool within the rectal vault. No bowel obstruction. Electronically Signed   By: Anner Crete M.D.   On: 06/26/2020 18:28   DG CHEST PORT 1 VIEW  Result Date: 06/26/2020 CLINICAL DATA:  Fever EXAM: PORTABLE CHEST 1 VIEW COMPARISON:  06/24/2020 FINDINGS: There is mild diffuse pulmonary infiltrate again seen throughout the left lung, likely infectious in the acute setting, similar to that noted on prior examination. Lung volumes are low, however, are stable since prior examination. No pneumothorax or pleural effusion. Cardiac size within normal limits. No acute bone abnormality. IMPRESSION: Stable  examination. Diffuse pulmonary infiltrate within the left lung, likely infectious. Mild pulmonary hypoinflation. Electronically Signed   By: Fidela Salisbury MD   On: 06/26/2020 08:36        Scheduled Meds: . (feeding supplement) PROSource Plus  30 mL Oral TID WC  . aspirin EC  81 mg Oral Daily  . benzonatate  100 mg Oral TID  . Chlorhexidine Gluconate Cloth  6 each Topical Daily  . dalfampridine  10 mg Oral BID  . enoxaparin (LOVENOX) injection  40 mg Subcutaneous Q24H  . ferrous sulfate  325 mg Oral BID  . fluticasone furoate-vilanterol  1 puff Inhalation Daily  . insulin aspart  0-5 Units Subcutaneous QHS  . insulin aspart  0-9 Units Subcutaneous TID WC  . metoprolol succinate  100 mg Oral Daily  . multivitamin with minerals  1 tablet Oral Daily  . oxybutynin  10 mg Oral Daily  . pantoprazole  40 mg Oral Daily  . Plecanatide  1 tablet Oral Daily  . pravastatin  20 mg Oral q1800  . senna-docusate  1 tablet Oral BID  . sorbitol, milk of mag, mineral oil, glycerin (SMOG) enema  960 mL Rectal Once  . tiZANidine  8 mg Oral TID   Continuous Infusions: . piperacillin-tazobactam (ZOSYN)  IV 3.375 g (06/27/20 1203)     LOS: 11 days    Time spent: 40 minutes    Irine Seal, MD [facility]   To contact the attending provider between 7A-7P or the covering provider during after hours 7P-7A, please log into the web site [email or web address] and access using universal  password for that web site. If you do not have the password, please call the hospital operator.  06/27/2020, 12:29 PM

## 2020-06-27 NOTE — Progress Notes (Signed)
INFECTIOUS DISEASE PROGRESS NOTE  ID: Tina Griffith is a 60 y.o. female with  Principal Problem:   Sepsis with acute organ dysfunction without septic shock ([place]) Active Problems:   Multiple sclerosis ([place])   Chronic kidney disease   Decubital ulcer   Essential (primary) hypertension   Hypomagnesemia   Anterior optic neuritis   Hemiplegia, spastic (HCC)   Leucocytosis   UTI (urinary tract infection)   Wound infection   Malnutrition of moderate degree   Anemia of chronic disease   Fever   Hypoxia   Impaction of colon (H[facility])  Subjective: No complaints No SOB,   Abtx:  Anti-infectives (From admission, onward)   Start     Dose/Rate Route Frequency Ordered Stop   06/26/20 1800  piperacillin-tazobactam (ZOSYN) IVPB 3.375 g        3.375 g 12.5 mL/hr over 240 Minutes Intravenous Every 8 hours 06/26/20 1643     06/22/20 2200  amoxicillin-clavulanate (AUGMENTIN) 875-125 MG per tablet 1 tablet  Status:  Discontinued        1 tablet Oral Every 12 hours 06/22/20 1548 06/26/20 1639   06/22/20 2200  doxycycline (VIBRA-TABS) tablet 100 mg  Status:  Discontinued        100 mg Oral Every 12 hours 06/22/20 1548 06/26/20 1639   06/20/20 2000  DAPTOmycin (CUBICIN) 650 mg in sodium chloride 0.9 % IVPB  Status:  Discontinued        650 mg 226 mL/hr over 30 Minutes Intravenous Daily 06/20/20 1320 06/22/20 1548   06/20/20 1800  ertapenem (INVANZ) 1,000 mg in sodium chloride 0.9 % 100 mL IVPB  Status:  Discontinued        1 g 200 mL/hr over 30 Minutes Intravenous Every 24 hours 06/20/20 1402 06/22/20 1548   06/19/20 2000  doxycycline (VIBRA-TABS) tablet 100 mg  Status:  Discontinued        100 mg Oral 2 times per day 06/19/20 1324 06/20/20 1320   06/17/20 1200  vancomycin (VANCOREADY) IVPB 750 mg/150 mL  Status:  Discontinued        750 mg 150 mL/hr over 60 Minutes Intravenous Every 12 hours 06/16/20 2115 06/19/20 1322   06/17/20 0015  piperacillin-tazobactam (ZOSYN) IVPB 3.375 g  Status:   Discontinued        3.375 g 12.5 mL/hr over 240 Minutes Intravenous Every 8 hours 06/17/20 0004 06/20/20 1402   06/17/20 0000  piperacillin-tazobactam (ZOSYN) IVPB 3.375 g  Status:  Discontinued        3.375 g 100 mL/hr over 30 Minutes Intravenous Every 6 hours 06/16/20 2329 06/17/20 0003   06/16/20 2200  cefTRIAXone (ROCEPHIN) 2 g in sodium chloride 0.9 % 100 mL IVPB  Status:  Discontinued        2 g 200 mL/hr over 30 Minutes Intravenous Every 24 hours 06/16/20 2101 06/16/20 2329   06/16/20 2115  vancomycin (VANCOREADY) IVPB 1500 mg/300 mL        1,500 mg 150 mL/hr over 120 Minutes Intravenous  Once 06/16/20 2106 06/17/20 0235   06/16/20 1545  piperacillin-tazobactam (ZOSYN) IVPB 3.375 g        3.375 g 100 mL/hr over 30 Minutes Intravenous  Once 06/16/20 1533 06/16/20 1633      Medications:  Scheduled: . (feeding supplement) PROSource Plus  30 mL Oral TID WC  . aspirin EC  81 mg Oral Daily  . benzonatate  100 mg Oral TID  . Chlorhexidine Gluconate Cloth  6 each Topical Daily  .  dalfampridine  10 mg Oral BID  . enoxaparin (LOVENOX) injection  40 mg Subcutaneous Q24H  . ferrous sulfate  325 mg Oral BID  . fluticasone furoate-vilanterol  1 puff Inhalation Daily  . insulin aspart  0-5 Units Subcutaneous QHS  . insulin aspart  0-9 Units Subcutaneous TID WC  . metoprolol succinate  100 mg Oral Daily  . multivitamin with minerals  1 tablet Oral Daily  . oxybutynin  10 mg Oral Daily  . pantoprazole  40 mg Oral Daily  . Plecanatide  1 tablet Oral Daily  . pravastatin  20 mg Oral q1800  . senna-docusate  1 tablet Oral BID  . sorbitol, milk of mag, mineral oil, glycerin (SMOG) enema  960 mL Rectal Once  . tiZANidine  8 mg Oral TID    Objective: Vital signs in last 24 hours: Temp:  [98 F (36.7 C)-99.8 F (37.7 C)] 99.2 F (37.3 C) (02/04 1329) Pulse Rate:  [92-102] 92 (02/04 1329) Resp:  [16-25] 25 (02/04 1329) BP: (99-136)/(49-65) 110/49 (02/04 1329) SpO2:  [97 %-100 %] 100 %  (02/04 1329)   General appearance: alert, cooperative and no distress Resp: clear to auscultation bilaterally Cardio: regularly irregular rhythm GI: normal findings: bowel sounds normal and soft, non-tender Skin: decubiutus ulcer examined with nursing staff. wound is clean, healthy appearing tissue.   Lab Results Recent Labs    06/26/20 0450 06/27/20 0505  WBC 13.0* 14.3*  HGB 8.9* 8.5*  HCT 28.1* 26.5*  NA 136 136  K 3.8 4.0  CL 104 99  CO2 23 23  BUN 11 10  CREATININE 0.66 0.55   Liver Panel Recent Labs    06/26/20 0450 06/27/20 0505  ALBUMIN 2.0* 2.1*   Sedimentation Rate No results for input(s): ESRSEDRATE in the last 72 hours. C-Reactive Protein No results for input(s): CRP in the last 72 hours.  Microbiology: Recent Results (from the past 240 hour(s))  Surgical pcr screen     Status: Abnormal   Collection Time: 06/19/20  8:26 AM   Specimen: Nasal Mucosa; Nasal Swab  Result Value Ref Range Status   MRSA, PCR NEGATIVE NEGATIVE Final   Staphylococcus aureus POSITIVE (A) NEGATIVE Final    Comment: (NOTE) The Xpert SA Assay (FDA approved for NASAL specimens in patients 42 years of age and older), is one component of a comprehensive surveillance program. It is not intended to diagnose infection nor to guide or monitor treatment. Performed at [facility], [place] [address], [place], [place] [address]   Culture, blood (routine x 2)     Status: None   Collection Time: 06/20/20  8:11 AM   Specimen: BLOOD  Result Value Ref Range Status   Specimen Description   Final    BLOOD BLOOD RIGHT FOREARM Performed at [facility] [address], [place], [place] [address]    Special Requests   Final    BOTTLES DRAWN AEROBIC ONLY Blood Culture adequate volume Performed at [facility] [address] [place], [place] [address]    Culture   Final    NO GROWTH 5 DAYS Performed at [facility], [place]  [address], [place], [place] [address]    Report Status 06/25/2020 FINAL  Final  Culture, blood (routine x 2)     Status: None   Collection Time: 06/20/20  8:12 AM   Specimen: BLOOD RIGHT HAND  Result Value Ref Range Status   Specimen Description   Final  BLOOD RIGHT HAND Performed at [facility], [place] [address] [place], [place] [address]    Special Requests   Final    BOTTLES DRAWN AEROBIC ONLY Blood Culture adequate volume Performed at [facility] [address], [place], [place] [address]    Culture   Final    NO GROWTH 5 DAYS Performed at [facility], [place] [address], [place], [place] [address]    Report Status 06/25/2020 FINAL  Final  Culture, blood (Routine X 2) w Reflex to ID Panel     Status: None (Preliminary result)   Collection Time: 06/26/20  5:22 PM   Specimen: BLOOD RIGHT HAND  Result Value Ref Range Status   Specimen Description   Final    BLOOD RIGHT HAND Performed at [facility] [address] [place], [place] [address]    Special Requests   Final    BOTTLES DRAWN AEROBIC ONLY Blood Culture results may not be optimal due to an inadequate volume of blood received in culture bottles Performed at [facility] [address] [place], [place] [address]    Culture   Final    NO GROWTH < 12 HOURS Performed at [facility] [address] [place], [place] [address]    Report Status PENDING  Incomplete  Culture, blood (Routine X 2) w Reflex to ID Panel     Status: None (Preliminary result)   Collection Time: 06/26/20  5:22 PM   Specimen: BLOOD RIGHT HAND  Result Value Ref Range Status   Specimen Description   Final    BLOOD RIGHT HAND Performed at [facility] [address], [place], [place] [address]    Special Requests   Final    BOTTLES DRAWN AEROBIC ONLY Blood Culture results may not be optimal due to an inadequate volume of blood received in  culture bottles Performed at [facility] [address], [place],  [address]    Culture   Final    NO GROWTH < 12 HOURS Performed at [facility] [address], [place],  [address]    Report Status PENDING  Incomplete    Studies/Results: DG Abd 1 View  Result Date: 06/26/2020 CLINICAL DATA:  60 year old female with impacted colon. EXAM: ABDOMEN - 1 VIEW COMPARISON:  CT abdomen pelvis dated 06/16/2020. FINDINGS: Stool within the rectal vault similar to prior CT. There is moderate stool in the distal colon. No bowel dilatation or evidence of obstruction. No free air a calculi. The osseous structures are intact. IMPRESSION: Stool within the rectal vault. No bowel obstruction. Electronically Signed   By: Anner Crete M.D.   On: 06/26/2020 18:28   DG CHEST PORT 1 VIEW  Result Date: 06/26/2020 CLINICAL DATA:  Fever EXAM: PORTABLE CHEST 1 VIEW COMPARISON:  06/24/2020 FINDINGS: There is mild diffuse pulmonary infiltrate again seen throughout the left lung, likely infectious in the acute setting, similar to that noted on prior examination. Lung volumes are low, however, are stable since prior examination. No pneumothorax or pleural effusion. Cardiac size within normal limits. No acute bone abnormality. IMPRESSION: Stable examination. Diffuse pulmonary infiltrate within the left lung, likely infectious. Mild pulmonary hypoinflation. Electronically Signed   By: Fidela Salisbury MD   On: 06/26/2020 08:36     Assessment/Plan: Sepsis- resolved New fever in hospital Anemia, melena Stage IV sacral decub fever  Sacral ulcer s/p I&D 1/27 to remove necrotic skin/fascia//muscle E coli UCx Bacteroides species bsi Dm2 (A1C 6.5%)   Total days of antibiotics: 10 (9 augmentin/doxyv -->)    1 zosyn  Will continue zosyn for now Her WBC is stable No further fevers O2 sats are good BCx pending Would consider 5 days of zosyn then stop anbx available  as needed.         Bobby Rumpf MD, FACP Infectious Diseases (pager) [phone] [email or web address] 06/27/2020, 1:47 PM  LOS: 11 days

## 2020-06-28 DIAGNOSIS — A419 Sepsis, unspecified organism: Secondary | ICD-10-CM | POA: Diagnosis not present

## 2020-06-28 DIAGNOSIS — D638 Anemia in other chronic diseases classified elsewhere: Secondary | ICD-10-CM | POA: Diagnosis not present

## 2020-06-28 DIAGNOSIS — I10 Essential (primary) hypertension: Secondary | ICD-10-CM | POA: Diagnosis not present

## 2020-06-28 DIAGNOSIS — L89154 Pressure ulcer of sacral region, stage 4: Secondary | ICD-10-CM | POA: Diagnosis not present

## 2020-06-28 LAB — CBC WITH DIFFERENTIAL/PLATELET
Abs Immature Granulocytes: 0.2 10*3/uL — ABNORMAL HIGH (ref 0.00–0.07)
Basophils Absolute: 0.1 10*3/uL (ref 0.0–0.1)
Basophils Relative: 1 %
Eosinophils Absolute: 0.4 10*3/uL (ref 0.0–0.5)
Eosinophils Relative: 4 %
HCT: 26.1 % — ABNORMAL LOW (ref 36.0–46.0)
Hemoglobin: 8.4 g/dL — ABNORMAL LOW (ref 12.0–15.0)
Immature Granulocytes: 2 %
Lymphocytes Relative: 18 %
Lymphs Abs: 2.2 10*3/uL (ref 0.7–4.0)
MCH: 26.3 pg (ref 26.0–34.0)
MCHC: 32.2 g/dL (ref 30.0–36.0)
MCV: 81.6 fL (ref 80.0–100.0)
Monocytes Absolute: 0.9 10*3/uL (ref 0.1–1.0)
Monocytes Relative: 7 %
Neutro Abs: 8.3 10*3/uL — ABNORMAL HIGH (ref 1.7–7.7)
Neutrophils Relative %: 68 %
Platelets: 683 10*3/uL — ABNORMAL HIGH (ref 150–400)
RBC: 3.2 MIL/uL — ABNORMAL LOW (ref 3.87–5.11)
RDW: 20 % — ABNORMAL HIGH (ref 11.5–15.5)
WBC: 12.1 10*3/uL — ABNORMAL HIGH (ref 4.0–10.5)
nRBC: 0.2 % (ref 0.0–0.2)

## 2020-06-28 LAB — BASIC METABOLIC PANEL
Anion gap: 8 (ref 5–15)
BUN: 11 mg/dL (ref 6–20)
CO2: 25 mmol/L (ref 22–32)
Calcium: 8.6 mg/dL — ABNORMAL LOW (ref 8.9–10.3)
Chloride: 102 mmol/L (ref 98–111)
Creatinine, Ser: 0.55 mg/dL (ref 0.44–1.00)
GFR, Estimated: >60 mL/min (ref >60)
Glucose, Bld: 95 mg/dL (ref 70–99)
Potassium: 3.9 mmol/L (ref 3.5–5.1)
Sodium: 135 mmol/L (ref 135–145)

## 2020-06-28 LAB — URINE CULTURE: Culture: <10000 — AB

## 2020-06-28 LAB — GLUCOSE, CAPILLARY
Glucose-Capillary: 92 mg/dL (ref 70–99)
Glucose-Capillary: 104 mg/dL — ABNORMAL HIGH (ref 70–99)
Glucose-Capillary: 108 mg/dL — ABNORMAL HIGH (ref 70–99)
Glucose-Capillary: 136 mg/dL — ABNORMAL HIGH (ref 70–99)

## 2020-06-28 LAB — MAGNESIUM: Magnesium: 1.9 mg/dL (ref 1.7–2.4)

## 2020-06-28 MED ORDER — POLYETHYLENE GLYCOL 3350 17 G PO PACK
17.0000 g | PACK | Freq: Every day | ORAL | Status: DC
  Administered 2020-06-28: 11:00:00 17 g via ORAL
  Filled 2020-06-28: qty 1

## 2020-06-28 NOTE — Progress Notes (Addendum)
PROGRESS NOTE    Tina Griffith  STM:[id number] DOB: 1961-05-13 DOA: 06/16/2020 PCP: [facility]    Chief Complaint  Patient presents with  . Wound Infection  . Fatigue    Brief Narrative:  Tina Griffith is a 60 y.o. female with PMH significant for multiple sclerosis, movement disorder, hypertension, constipation and COVID-19 pneumonia in December 2021.  At baseline, she was using a walker and wheelchair up until [place] admission.  Since then she has remained bedbound at home and has developed sacral decubitus ulcers. 1/24, patient was brought to the ED with complaint of failure to thrive, worsening of gluteal/sacral pressure sores.  Since last hospitalization 4 weeks ago for Covid pneumonia, patient has had anorexia, weight loss, generalized fatigue and worsening lower extremity edema.  Patient was admitted for sepsis secondary to infected sacral decubitus ulcer. 1/27, patient underwent debridement of sacral decubitus ulcer by Dr. Harlow Asa.  Infectious disease consultation was obtained.  Patient was initially placed on broad-spectrum IV antibiotics.  Currently on 2 weeks course of oral antibiotics. General surgery and infectious disease signed off. Currently pending placement.   Assessment & Plan:   Principal Problem:   Sepsis with acute organ dysfunction without septic shock (HCC) Active Problems:   Multiple sclerosis (HCC)   Chronic kidney disease   Decubital ulcer   Essential (primary) hypertension   Hypomagnesemia   Anterior optic neuritis   Hemiplegia, spastic (HCC)   Leucocytosis   UTI (urinary tract infection)   Wound infection   Malnutrition of moderate degree   Anemia of chronic disease   Fever   Hypoxia   Impaction of colon ([place])  1 sepsis secondary to infected stage IV sacral decubitus ulcer/blood cultures positive for Bacteroides of ovatus Patient presented with stage IV sacral decubitus ulcer.  Seen in consultation by wound care nurse and general  surgery.  Status post debridement of sacral decubitus ulcer per general surgery 06/19/2020. 1/2 blood cultures from 06/16/2020 + for bacteriodes ovatus.  Repeat blood cultures 06/20/2020 negative to date. -ID consulted and was following initially patient was on IV ertapenem and subsequently IV daptomycin.  Patient transitioned to oral Augmentin and doxycycline for total of 2 weeks through 07/01/2020 per initial ID recommendation.   Leukocytosis was trending down but trending back up and now trending back down with change of antibiotics..  Repeat urinalysis unremarkable.  Urine cultures pending.  Repeat blood cultures pending.  Repeat chest x-ray done concerning for left-sided infiltrate.  Was on Augmentin and doxy which have been discontinued and patient on IV Zosyn x5 days per ID recommendations due to ongoing fevers. Currently afebrile. Will need outpatient follow-up with general surgery and ID on discharge.   2.  E. coli UTI Was on IV ertapenem as well as IV daptomycin.  Was on Augmentin and doxycycline as recommended per ID, however due to ongoing fevers IV antibiotics were changed to IV Zosyn per ID recs.  Follow.   3.  Fever Patient noted to have a temp of 102.2 (06/25/2020).  Repeat UA unremarkable.  Repeat chest x-ray done concerning for left-sided infiltrate which was not there on admission chest x-ray.  Patient was on Augmentin and doxycycline.  Due to ongoing fevers ID consulted, abdominal films obtained concerning for constipation.  Blood cultures ordered and currently pending with no growth to date.  Antibiotics have been changed and patient currently on IV Zosyn.  Currently afebrile.  Continue supportive care.  ID following and appreciate their input and recommendations.   4.  New  oxygen dependence Patient noted to have new O2 requirements on 4 L nasal cannula.  Patient noted to have sats of 95% on room air on 06/25/2020..  Currently on 2 L nasal cannula with sats of 94%.  Patient noted to spike a  temperature of 102 the evening of 06/25/2020.  Chest x-ray done concerning for pneumonia.  Patient was on oral antibiotics of Augmentin and doxy.  ID consulted antibiotics changed to IV Zosyn and ID recommending a total of 5 days of IV Zosyn.  5.  Multiple sclerosis Stable.  Continue home regimen of baclofen, dalfampridine, ocrelizumab.  Outpatient follow-up  6.  Pain management Continue current regimen of tramadol as needed, OxyIR as needed, Dilaudid as needed.  Minimize pain medication.  Follow.  7.  Hypertension Stable on current regimen of Toprol-XL. HCTZ on hold. Outpatient follow-up.   8.  Well-controlled diabetes mellitus type 2 Hemoglobin A1c 6.5 (06/18/2020).  CBG 92 this morning. SSI. Victoza on hold and likely will resume on discharge.  9.  Elevated troponin Patient noted to have elevated troponin up to 112 which trended down.  Patient asymptomatic.  2D echo with EF of 60 to 65%, mild LVH.  Continue aspirin and statin.  Outpatient follow-up.  10.  Hypokalemia/hypomagnesemia/hypophosphatemia Potassium currently at 3.9. Magnesium at 1.9. Follow.  11.  History of IBS Trulance and PPI.  12.  Anemia of chronic disease/melena Patient noted to have a hemoglobin of 11.1 on admission which trended down and currently stabilized at 8.4.  Patient noted to have had a few episodes of melena however no recurrence.  Patient with no source of bleeding.  Continue PPI.  Follow H&H.  Transfusion threshold < 7.  13.  Constipation Smog enema x 1 ordered. Continue current Senokot-S twice daily..  Continue Senokot-S twice daily. Continue MiraLAX 17 g daily. Follow.   DVT prophylaxis: Lovenox Code Status: Full Family Communication: Updated patient. Updated daughter [place] on telephone. Disposition:   Status is: Inpatient    Dispo: The patient is from: Home              Anticipated d/c is to: SNF              Anticipated d/c date is: 4 days/07/02/2020              Patient currently with fevers  work-up underway, IV antibiotics, not stable for discharge.        Consultants:   Infectious disease: Dr. West Bali 06/20/2020  Wound care  General surgery: Dr.Gerkin 06/18/2021  Infectious disease: Dr. Johnnye Sima 06/26/2020  Procedures:   CT abdomen and pelvis 06/16/2020  Chest x-ray 06/16/2020, 06/24/2020, 06/26/2020  2D echo 06/18/2020  Sharp debridement of stage IV sacral decubitus ulcer with removal of necrotic skin, adipose tissue, fascia and muscle.  Approximate amount of tissue debridement is 15 cm x 10 cm x 5 cm per Dr. Harlow Asa general surgery 06/19/2020  KUB 06/26/2020  Antimicrobials:   Augmentin 06/22/2020>>>> 06/26/2020  Doxycycline 06/22/2020>>>>> 06/26/2020  Doxycycline 06/19/2020>>>> 06/20/2020  IV daptomycin 06/20/2020>>>> 06/22/2020  IV Invanz 06/20/2020>>>> 06/22/2020  IV Zosyn 06/17/2020>>>> 06/20/2020  IV vancomycin 06/16/2020>>>>> 06/19/2020  IV Zosyn 06/26/2020>>>>   Subjective: Awake.  Alert.  Denies any chest pain.  No shortness of breath.  No abdominal pain.  Stated having bowel movements.  Thinks iron pill is causing her to have multiple bowel movements. Afebrile.  Objective: Vitals:   06/27/20 2032 06/28/20 0142 06/28/20 0529 06/28/20 0747  BP: 122/76 120/62 128/77   Pulse: 98 86  91   Resp: 16 16 16    Temp: 99.7 F (37.6 C) 98.7 F (37.1 C) 98.6 F (37 C)   TempSrc: Oral Oral Oral   SpO2: 97% 96% 98% 94%  Weight:      Height:        Intake/Output Summary (Last 24 hours) at 06/28/2020 1204 Last data filed at 06/28/2020 0534 Gross per 24 hour  Intake 120 ml  Output 1800 ml  Net -1680 ml   Filed Weights   06/17/20 1145 06/18/20 0500 06/19/20 0844  Weight: 74.8 kg 81.8 kg 81.8 kg    Examination:  General exam: NAD Respiratory system: CTAB anterior lung fields. No wheezes, no crackles, no rhonchi. Fair air movement. Cardiovascular system: Regular rate rhythm no murmurs rubs or gallops. No JVD. No lower extremity edema. Gastrointestinal system: Abdomen  is soft, nontender, nondistended, positive bowel sounds. No rebound. No guarding.  Central nervous system: Alert and oriented. No focal neurological deficits. Extremities: Symmetric 5 x 5 power. Skin: No rashes, lesions or ulcers Psychiatry: Judgement and insight appear normal. Mood & affect appropriate.     Data Reviewed: I have personally reviewed following labs and imaging studies  CBC: Recent Labs  Lab 06/23/20 0607 06/24/20 0517 06/26/20 0450 06/27/20 0505 06/28/20 0536  WBC 13.7* 12.3* 13.0* 14.3* 12.1*  NEUTROABS 11.1* 9.7* 9.3* 10.6* 8.3*  HGB 7.9* 8.5* 8.9* 8.5* 8.4*  HCT 24.4* 25.8* 28.1* 26.5* 26.1*  MCV 80.0 81.9 83.9 81.5 81.6  PLT 507* 545* 565* 643* 683*    Basic Metabolic Panel: Recent Labs  Lab 06/22/20 0931 06/23/20 0607 06/24/20 0517 06/26/20 0450 06/27/20 0505 06/28/20 0536  NA 136 133* 136 136 136 135  K 3.7 4.0 3.9 3.8 4.0 3.9  CL 100 98 100 104 99 102  CO2 24 25 26 23 23 25   GLUCOSE 89 105* 102* 111* 113* 95  BUN 8 9 11 11 10 11   CREATININE 0.54 0.57 0.37* 0.66 0.55 0.55  CALCIUM 8.6* 8.4* 8.6* 8.7* 8.8* 8.6*  MG 1.4*  --  1.7 1.7 1.7 1.9  PHOS 2.1*  --  2.2* 3.0 2.8  --     GFR: Estimated Creatinine Clearance: 80 mL/min (by C-G formula based on SCr of 0.55 mg/dL).  Liver Function Tests: Recent Labs  Lab 06/26/20 0450 06/27/20 0505  ALBUMIN 2.0* 2.1*    CBG: Recent Labs  Lab 06/27/20 1127 06/27/20 1646 06/27/20 2031 06/28/20 0717 06/28/20 1153  GLUCAP 105* 117* 103* 92 104*     Recent Results (from the past 240 hour(s))  Surgical pcr screen     Status: Abnormal   Collection Time: 06/19/20  8:26 AM   Specimen: Nasal Mucosa; Nasal Swab  Result Value Ref Range Status   MRSA, PCR NEGATIVE NEGATIVE Final   Staphylococcus aureus POSITIVE (A) NEGATIVE Final    Comment: (NOTE) The Xpert SA Assay (FDA approved for NASAL specimens in patients 80 years of age and older), is one component of a comprehensive surveillance  program. It is not intended to diagnose infection nor to guide or monitor treatment. Performed at [facility], [place] [address] [place], [place] [address]   Culture, blood (routine x 2)     Status: None   Collection Time: 06/20/20  8:11 AM   Specimen: BLOOD  Result Value Ref Range Status   Specimen Description   Final    BLOOD BLOOD RIGHT FOREARM Performed at [place] [address], [place], [place] [address]  Special Requests   Final    BOTTLES DRAWN AEROBIC ONLY Blood Culture adequate volume Performed at [facility], [address], [place], [place] [address]    Culture   Final    NO GROWTH 5 DAYS Performed at [facility], [address], [place], [place] [address]    Report Status 06/25/2020 FINAL  Final  Culture, blood (routine x 2)     Status: None   Collection Time: 06/20/20  8:12 AM   Specimen: BLOOD RIGHT HAND  Result Value Ref Range Status   Specimen Description   Final    BLOOD RIGHT HAND Performed at [facility], [address] [place], [place] [address]    Special Requests   Final    BOTTLES DRAWN AEROBIC ONLY Blood Culture adequate volume Performed at [facility], [address], [place], [place] [address]    Culture   Final    NO GROWTH 5 DAYS Performed at [facility], [address], [place], [place] [address]    Report Status 06/25/2020 FINAL  Final  Culture, Urine     Status: Abnormal   Collection Time: 06/26/20  2:37 PM   Specimen: Urine, Random  Result Value Ref Range Status   Specimen Description   Final    URINE, RANDOM Performed at [facility], [address], [place], [place] [address]    Special Requests   Final    NONE Performed at [facility], [address], [place], [place] [address]    Culture (A)  Final    <10,000 COLONIES/mL INSIGNIFICANT GROWTH Performed at [facility], [address], [place], [place] [address]    Report Status 06/28/2020 FINAL  Final  Culture, blood (Routine X 2) w Reflex to ID Panel     Status: None (Preliminary result)   Collection Time: 06/26/20  5:22 PM   Specimen: BLOOD RIGHT HAND  Result Value Ref Range Status   Specimen Description   Final    BLOOD RIGHT HAND Performed at [facility], [address], [place], [place] [address]    Special Requests   Final    BOTTLES DRAWN AEROBIC ONLY Blood Culture results may not be optimal due to an inadequate volume of blood received in culture bottles Performed at [facility], [address], [place], [place] [address]    Culture   Final    NO GROWTH 2 DAYS Performed at [facility], [address], [place], [place] [address]    Report Status PENDING  Incomplete  Culture, blood (Routine X 2) w Reflex to ID Panel     Status: None (Preliminary result)   Collection Time: 06/26/20  5:22 PM   Specimen: BLOOD RIGHT HAND  Result Value Ref Range Status   Specimen Description   Final    BLOOD RIGHT HAND Performed at [facility], [address], [place], [place] [address]    Special Requests   Final    BOTTLES DRAWN AEROBIC ONLY Blood Culture results may not be optimal due to an inadequate volume of blood received in culture bottles Performed at [facility], [address], [place], [place] [address]    Culture   Final    NO GROWTH 2 DAYS Performed at [facility], [address], [place], [place] [address]    Report Status PENDING  Incomplete  Radiology Studies: DG Abd 1 View  Result Date: 06/26/2020 CLINICAL DATA:  60 year old female with impacted colon. EXAM: ABDOMEN - 1 VIEW COMPARISON:  CT abdomen pelvis dated 06/16/2020. FINDINGS: Stool within the rectal vault similar to prior CT. There is moderate stool in the distal colon. No bowel dilatation or evidence of obstruction.  No free air a calculi. The osseous structures are intact. IMPRESSION: Stool within the rectal vault. No bowel obstruction. Electronically Signed   By: Anner Crete M.D.   On: 06/26/2020 18:28        Scheduled Meds: . (feeding supplement) PROSource Plus  30 mL Oral TID WC  . aspirin EC  81 mg Oral Daily  . benzonatate  100 mg Oral TID  . Chlorhexidine Gluconate Cloth  6 each Topical Daily  . dalfampridine  10 mg Oral BID  . enoxaparin (LOVENOX) injection  40 mg Subcutaneous Q24H  . ferrous sulfate  325 mg Oral BID  . fluticasone furoate-vilanterol  1 puff Inhalation Daily  . insulin aspart  0-5 Units Subcutaneous QHS  . insulin aspart  0-9 Units Subcutaneous TID WC  . metoprolol succinate  100 mg Oral Daily  . multivitamin with minerals  1 tablet Oral Daily  . oxybutynin  10 mg Oral Daily  . pantoprazole  40 mg Oral Daily  . Plecanatide  1 tablet Oral Daily  . polyethylene glycol  17 g Oral Daily  . pravastatin  20 mg Oral q1800  . senna-docusate  1 tablet Oral BID  . sorbitol, milk of mag, mineral oil, glycerin (SMOG) enema  960 mL Rectal Once  . tiZANidine  8 mg Oral TID   Continuous Infusions: . piperacillin-tazobactam (ZOSYN)  IV 3.375 g (06/28/20 0102)     LOS: 12 days    Time spent: 40 minutes    Irine Seal, MD [facility]   To contact the attending provider between 7A-7P or the covering provider during after hours 7P-7A, please log into the web site [email or web address] and access using universal [place] password for that web site. If you do not have the password, please call the hospital operator.  06/28/2020, 12:04 PM

## 2020-06-28 NOTE — Progress Notes (Signed)
Pt's daughter came and fed pt her supper. Also brought more of her home meds for the pt's MS. Pt's daughter is asking if the MD will consider sending the pt to surgery, to get a colostomy so that her stool would not get into her sacral wound. I explained to daughter that this is not something that we would normally consider. That we would continue to check on pt and keep her clean and dry. Daughter still wishes to relay this request to her MD.

## 2020-06-29 DIAGNOSIS — L89154 Pressure ulcer of sacral region, stage 4: Secondary | ICD-10-CM | POA: Diagnosis not present

## 2020-06-29 DIAGNOSIS — A419 Sepsis, unspecified organism: Secondary | ICD-10-CM | POA: Diagnosis not present

## 2020-06-29 DIAGNOSIS — D638 Anemia in other chronic diseases classified elsewhere: Secondary | ICD-10-CM | POA: Diagnosis not present

## 2020-06-29 DIAGNOSIS — I10 Essential (primary) hypertension: Secondary | ICD-10-CM | POA: Diagnosis not present

## 2020-06-29 LAB — CBC WITH DIFFERENTIAL/PLATELET
Abs Immature Granulocytes: 0.37 10*3/uL — ABNORMAL HIGH (ref 0.00–0.07)
Basophils Absolute: 0.1 10*3/uL (ref 0.0–0.1)
Basophils Relative: 1 %
Eosinophils Absolute: 0.2 10*3/uL (ref 0.0–0.5)
Eosinophils Relative: 1 %
HCT: 29.4 % — ABNORMAL LOW (ref 36.0–46.0)
Hemoglobin: 9.6 g/dL — ABNORMAL LOW (ref 12.0–15.0)
Immature Granulocytes: 2 %
Lymphocytes Relative: 14 %
Lymphs Abs: 2.2 10*3/uL (ref 0.7–4.0)
MCH: 26.1 pg (ref 26.0–34.0)
MCHC: 32.7 g/dL (ref 30.0–36.0)
MCV: 79.9 fL — ABNORMAL LOW (ref 80.0–100.0)
Monocytes Absolute: 1 10*3/uL (ref 0.1–1.0)
Monocytes Relative: 6 %
Neutro Abs: 11.7 10*3/uL — ABNORMAL HIGH (ref 1.7–7.7)
Neutrophils Relative %: 76 %
Platelets: 697 10*3/uL — ABNORMAL HIGH (ref 150–400)
RBC: 3.68 MIL/uL — ABNORMAL LOW (ref 3.87–5.11)
RDW: 20.2 % — ABNORMAL HIGH (ref 11.5–15.5)
WBC: 15.5 10*3/uL — ABNORMAL HIGH (ref 4.0–10.5)
nRBC: 0.1 % (ref 0.0–0.2)

## 2020-06-29 LAB — BASIC METABOLIC PANEL
Anion gap: 12 (ref 5–15)
BUN: 11 mg/dL (ref 6–20)
CO2: 23 mmol/L (ref 22–32)
Calcium: 8.9 mg/dL (ref 8.9–10.3)
Chloride: 103 mmol/L (ref 98–111)
Creatinine, Ser: 0.58 mg/dL (ref 0.44–1.00)
GFR, Estimated: >60 mL/min (ref >60)
Glucose, Bld: 110 mg/dL — ABNORMAL HIGH (ref 70–99)
Potassium: 4.1 mmol/L (ref 3.5–5.1)
Sodium: 138 mmol/L (ref 135–145)

## 2020-06-29 LAB — GLUCOSE, CAPILLARY
Glucose-Capillary: 116 mg/dL — ABNORMAL HIGH (ref 70–99)
Glucose-Capillary: 133 mg/dL — ABNORMAL HIGH (ref 70–99)
Glucose-Capillary: 121 mg/dL — ABNORMAL HIGH (ref 70–99)
Glucose-Capillary: 115 mg/dL — ABNORMAL HIGH (ref 70–99)

## 2020-06-29 LAB — MAGNESIUM: Magnesium: 1.6 mg/dL — ABNORMAL LOW (ref 1.7–2.4)

## 2020-06-29 MED ORDER — POLYETHYLENE GLYCOL 3350 17 G PO PACK: 17.0000 g | PACK | Freq: Every day | ORAL | Status: DC | PRN

## 2020-06-29 MED ORDER — MAGNESIUM SULFATE 4 GM/100ML IV SOLN
4.0000 g | Freq: Once | INTRAVENOUS | Status: AC
  Administered 2020-06-29: 15:00:00 4 g via INTRAVENOUS
  Filled 2020-06-29: qty 100

## 2020-06-29 NOTE — Progress Notes (Addendum)
PROGRESS NOTE    Tina Griffith  SWN:[id number] DOB: November 05, 1960 DOA: 06/16/2020 PCP: [facility]    Chief Complaint  Patient presents with  . Wound Infection  . Fatigue    Brief Narrative:  Tina Griffith is a 60 y.o. female with PMH significant for multiple sclerosis, movement disorder, hypertension, constipation and COVID-19 pneumonia in December 2021.  At baseline, she was using a walker and wheelchair up until [facility] admission.  Since then she has remained bedbound at home and has developed sacral decubitus ulcers. 1/24, patient was brought to the ED with complaint of failure to thrive, worsening of gluteal/sacral pressure sores.  Since last hospitalization 4 weeks ago for Covid pneumonia, patient has had anorexia, weight loss, generalized fatigue and worsening lower extremity edema.  Patient was admitted for sepsis secondary to infected sacral decubitus ulcer. 1/27, patient underwent debridement of sacral decubitus ulcer by Dr. Harlow Asa.  Infectious disease consultation was obtained.  Patient was initially placed on broad-spectrum IV antibiotics.  Currently on 2 weeks course of oral antibiotics. General surgery and infectious disease signed off. Currently pending placement.   Assessment & Plan:   Principal Problem:   Sepsis with acute organ dysfunction without septic shock (HCC) Active Problems:   Multiple sclerosis (HCC)   Chronic kidney disease   Decubital ulcer   Essential (primary) hypertension   Hypomagnesemia   Anterior optic neuritis   Hemiplegia, spastic (HCC)   Leucocytosis   UTI (urinary tract infection)   Wound infection   Malnutrition of moderate degree   Anemia of chronic disease   Fever   Hypoxia   Impaction of colon ([place])  1 sepsis secondary to infected stage IV sacral decubitus ulcer/blood cultures positive for Bacteroides of ovatus Patient presented with stage IV sacral decubitus ulcer.  Seen in consultation by wound care nurse and general  surgery.  Status post debridement of sacral decubitus ulcer per general surgery 06/19/2020. 1/2 blood cultures from 06/16/2020 + for bacteriodes ovatus.  Repeat blood cultures 06/20/2020 negative to date. -ID consulted and was following initially patient was on IV ertapenem and subsequently IV daptomycin.  Patient transitioned to oral Augmentin and doxycycline for total of 2 weeks through 07/01/2020 per initial ID recommendation.   Leukocytosis fluctuating.  Repeat urinalysis unremarkable.  Urine cultures with insignificant growth. Repeat blood cultures pending with no growth to date.  Repeat chest x-ray done concerning for left-sided infiltrate.  Was on Augmentin and doxy which have been discontinued and patient on IV Zosyn x5 days per ID recommendations due to ongoing fevers. Currently afebrile. Will need outpatient follow-up with general surgery and ID on discharge.   2.  E. coli UTI Was on IV ertapenem as well as IV daptomycin.  Was on Augmentin and doxycycline as recommended per ID, however due to ongoing fevers IV antibiotics were changed to IV Zosyn per ID recs.  Follow.   3.  Fever Patient noted to have a temp of 102.2 (06/25/2020).  Repeat UA unremarkable.  Urine cultures with insignificant growth.  Repeat chest x-ray done concerning for left-sided infiltrate which was not there on admission chest x-ray.  Patient was on Augmentin and doxycycline.  Due to ongoing fevers ID consulted, abdominal films obtained concerning for constipation.  Blood cultures ordered and currently pending with no growth to date.  Antibiotics have been changed and patient currently on IV Zosyn.  Continues to be afebrile on IV antibiotics.  Supportive care.  ID was following.    4.  New oxygen dependence Patient  noted to have new O2 requirements on 4 L nasal cannula.  Patient noted to have sats of 95% on room air on 06/25/2020..  Currently on room air with sats of 93 - 94%.  Patient noted to spike a temperature of 102 the evening  of 06/25/2020.  Chest x-ray done concerning for pneumonia.  Patient was on oral antibiotics of Augmentin and doxy.  ID consulted antibiotics changed to IV Zosyn and ID recommending a total of 5 days of IV Zosyn.  5.  Multiple sclerosis Continue home regimen baclofen, dalfampridine, ocrelizumab.  Outpatient follow-up  6.  Pain management Continue current regimen of tramadol as needed, OxyIR as needed, Dilaudid as needed.  Minimize pain medication.  Follow.  7.  Hypertension Controlled on current regimen of Toprol-XL.  Continue to hold HCTZ.  Outpatient follow-up.    8.  Well-controlled diabetes mellitus type 2 Hemoglobin A1c 6.5 (06/18/2020).  CBG 116 this morning.  Continue SSI.  Continue to hold Victoza and resume on discharge.    9.  Elevated troponin Patient noted to have elevated troponin up to 112 which trended down.  Patient asymptomatic.  2D echo with EF of 60 to 65%, mild LVH.  Continue aspirin and statin.  Outpatient follow-up.  10.  Hypokalemia/hypomagnesemia/hypophosphatemia Potassium at 4.1.  Magnesium at 1.6.  Magnesium sulfate 4 g IV x1.  Repeat labs in the morning.   11.  History of IBS Continue PPI, Trulance.    12.  Anemia of chronic disease/melena Patient noted to have a hemoglobin of 11.1 on admission which trended down and currently stabilized at 9.6.  Patient noted to have had a few episodes of melena however no recurrence.  Patient with no source of bleeding.  Continue PPI.  Follow H&H.  Transfusion threshold < 7.  13.  Constipation Smog enema x 1 ordered.  Patient with multiple bowel movements.  Continue Senokot-S twice daily.  Change MiraLAX to as needed.  Follow.    DVT prophylaxis: Lovenox Code Status: Full Family Communication: Updated patient.  No family at bedside.  Disposition:   Status is: Inpatient    Dispo: The patient is from: Home              Anticipated d/c is to: SNF              Anticipated d/c date is: /07/02/2020              Patient  currently with fevers work-up underway, IV antibiotics, not stable for discharge.        Consultants:   Infectious disease: Dr. West Bali 06/20/2020  Wound care  General surgery: Dr.Gerkin 06/18/2021  Infectious disease: Dr. Johnnye Sima 06/26/2020  Procedures:   CT abdomen and pelvis 06/16/2020  Chest x-ray 06/16/2020, 06/24/2020, 06/26/2020  2D echo 06/18/2020  Sharp debridement of stage IV sacral decubitus ulcer with removal of necrotic skin, adipose tissue, fascia and muscle.  Approximate amount of tissue debridement is 15 cm x 10 cm x 5 cm per Dr. Harlow Asa general surgery 06/19/2020  KUB 06/26/2020  Antimicrobials:   Augmentin 06/22/2020>>>> 06/26/2020  Doxycycline 06/22/2020>>>>> 06/26/2020  Doxycycline 06/19/2020>>>> 06/20/2020  IV daptomycin 06/20/2020>>>> 06/22/2020  IV Invanz 06/20/2020>>>> 06/22/2020  IV Zosyn 06/17/2020>>>> 06/20/2020  IV vancomycin 06/16/2020>>>>> 06/19/2020  IV Zosyn 06/26/2020>>>>   Subjective: Alert.  Awake.  Denies any chest pain or shortness of breath.  Stated tolerating diet.  Patient with 2-3 bowel movements this morning.  Afebrile.   Objective: Vitals:   06/28/20 2157 06/29/20 0155 06/29/20  0554 06/29/20 0721  BP: 112/78 (!) 157/73 (!) 134/92   Pulse: (!) 104 (!) 109 (!) 109   Resp: 18 18 18    Temp: 98.5 F (36.9 C) 98.4 F (36.9 C) 98 F (36.7 C)   TempSrc: Oral Oral Oral   SpO2: (!) 89% 90% 94% 93%  Weight:      Height:        Intake/Output Summary (Last 24 hours) at 06/29/2020 1210 Last data filed at 06/29/2020 0951 Gross per 24 hour  Intake 1690.26 ml  Output 1240 ml  Net 450.26 ml   Filed Weights   06/17/20 1145 06/18/20 0500 06/19/20 0844  Weight: 74.8 kg 81.8 kg 81.8 kg    Examination:  General exam: NAD Respiratory system: Lungs clear to auscultation bilaterally anterior lung fields.  No wheezes, no crackles, no rhonchi.  Fair air movement.   Cardiovascular system: RRR no murmurs rubs or gallops.  No JVD.  No lower extremity edema.   Gastrointestinal system: Abdomen obese, soft, nontender, nondistended, positive bowel sounds.  No rebound.  No guarding.  Central nervous system: Alert and oriented. No focal neurological deficits. Extremities: Symmetric 5 x 5 power. Skin: No rashes, lesions or ulcers Psychiatry: Judgement and insight appear normal. Mood & affect appropriate.     Data Reviewed: I have personally reviewed following labs and imaging studies  CBC: Recent Labs  Lab 06/24/20 0517 06/26/20 0450 06/27/20 0505 06/28/20 0536 06/29/20 0758  WBC 12.3* 13.0* 14.3* 12.1* 15.5*  NEUTROABS 9.7* 9.3* 10.6* 8.3* 11.7*  HGB 8.5* 8.9* 8.5* 8.4* 9.6*  HCT 25.8* 28.1* 26.5* 26.1* 29.4*  MCV 81.9 83.9 81.5 81.6 79.9*  PLT 545* 565* 643* 683* 697*    Basic Metabolic Panel: Recent Labs  Lab 06/24/20 0517 06/26/20 0450 06/27/20 0505 06/28/20 0536 06/29/20 0758  NA 136 136 136 135 138  K 3.9 3.8 4.0 3.9 4.1  CL 100 104 99 102 103  CO2 26 23 23 25 23   GLUCOSE 102* 111* 113* 95 110*  BUN 11 11 10 11 11   CREATININE 0.37* 0.66 0.55 0.55 0.58  CALCIUM 8.6* 8.7* 8.8* 8.6* 8.9  MG 1.7 1.7 1.7 1.9 1.6*  PHOS 2.2* 3.0 2.8  --   --     GFR: Estimated Creatinine Clearance: 80 mL/min (by C-G formula based on SCr of 0.58 mg/dL).  Liver Function Tests: Recent Labs  Lab 06/26/20 0450 06/27/20 0505  ALBUMIN 2.0* 2.1*    CBG: Recent Labs  Lab 06/28/20 1153 06/28/20 1650 06/28/20 2159 06/29/20 0721 06/29/20 1158  GLUCAP 104* 108* 136* 116* 133*     Recent Results (from the past 240 hour(s))  Culture, blood (routine x 2)     Status: None   Collection Time: 06/20/20  8:11 AM   Specimen: BLOOD  Result Value Ref Range Status   Specimen Description   Final    BLOOD BLOOD RIGHT FOREARM Performed at [facility] [address], [place], [place] [address]    Special Requests   Final    BOTTLES DRAWN AEROBIC ONLY Blood Culture adequate volume Performed at [facility] [address] [place], [place] [address]    Culture   Final    NO GROWTH 5 DAYS Performed at [facility], [place] [address] [place], [place] [address]    Report Status 06/25/2020 FINAL  Final  Culture, blood (routine x 2)     Status: None   Collection Time: 06/20/20  8:12 AM  Specimen: BLOOD RIGHT HAND  Result Value Ref Range Status   Specimen Description   Final    BLOOD RIGHT HAND Performed at [facility] [address] [place], [place] [address]    Special Requests   Final    BOTTLES DRAWN AEROBIC ONLY Blood Culture adequate volume Performed at [facility] [address], [place], [place] [address]    Culture   Final    NO GROWTH 5 DAYS Performed at [facility], [place] [address] [place], [place] [address]    Report Status 06/25/2020 FINAL  Final  Culture, Urine     Status: Abnormal   Collection Time: 06/26/20  2:37 PM   Specimen: Urine, Random  Result Value Ref Range Status   Specimen Description   Final    URINE, RANDOM Performed at [facility] [name], [place], [place] [address]    Special Requests   Final    NONE Performed at [facility], [place] [address], [place], [place] [address]    Culture (A)  Final    <10,000 COLONIES/mL INSIGNIFICANT GROWTH Performed at [facility] [address], [place], [place] [address]    Report Status 06/28/2020 FINAL  Final  Culture, blood (Routine X 2) w Reflex to ID Panel     Status: None (Preliminary result)   Collection Time: 06/26/20  5:22 PM   Specimen: BLOOD RIGHT HAND  Result Value Ref Range Status   Specimen Description   Final    BLOOD RIGHT HAND Performed at [facility] [address] [place], [place] [address]    Special Requests   Final    BOTTLES DRAWN AEROBIC ONLY Blood Culture results may not be optimal due to an inadequate volume of blood received in culture  bottles Performed at [facility] [address], [place], [place] [address]    Culture   Final    NO GROWTH 2 DAYS Performed at [facility] [address], [place], [place] [address]    Report Status PENDING  Incomplete  Culture, blood (Routine X 2) w Reflex to ID Panel     Status: None (Preliminary result)   Collection Time: 06/26/20  5:22 PM   Specimen: BLOOD RIGHT HAND  Result Value Ref Range Status   Specimen Description   Final    BLOOD RIGHT HAND Performed at [facility] [address] [place], [place] [address]    Special Requests   Final    BOTTLES DRAWN AEROBIC ONLY Blood Culture results may not be optimal due to an inadequate volume of blood received in culture bottles Performed at [facility] [address] [place], [place] [address]    Culture   Final    NO GROWTH 2 DAYS Performed at [facility] [address] [place],  [address]    Report Status PENDING  Incomplete         Radiology Studies: No results found.      Scheduled Meds: . (feeding supplement) PROSource Plus  30 mL Oral TID WC  . aspirin EC  81 mg Oral Daily  . benzonatate  100 mg Oral TID  . Chlorhexidine Gluconate Cloth  6 each Topical Daily  . dalfampridine  10 mg Oral BID  . enoxaparin (LOVENOX) injection  40 mg Subcutaneous Q24H  . ferrous sulfate  325 mg Oral BID  . fluticasone furoate-vilanterol  1 puff Inhalation Daily  . insulin aspart  0-5 Units Subcutaneous QHS  . insulin aspart  0-9 Units Subcutaneous TID WC  . metoprolol succinate  100 mg Oral Daily  . multivitamin with minerals  1 tablet Oral Daily  . oxybutynin  10 mg Oral Daily  . pantoprazole  40 mg Oral Daily  . Plecanatide  1 tablet Oral Daily  . pravastatin  20 mg Oral q1800  . senna-docusate  1 tablet Oral BID  . sorbitol, milk of mag, mineral oil, glycerin (SMOG) enema  960 mL Rectal Once  . tiZANidine  8 mg Oral TID    Continuous Infusions: . magnesium sulfate bolus IVPB    . piperacillin-tazobactam (ZOSYN)  IV 3.375 g (06/29/20 1051)     LOS: 13 days    Time spent: 40 minutes    Irine Seal, MD [facility]   To contact the attending provider between 7A-7P or the covering provider during after hours 7P-7A, please log into the web site [email or web address] and access using universal [place] password for that web site. If you do not have the password, please call the hospital operator.  06/29/2020, 12:10 PM

## 2020-06-30 DIAGNOSIS — T148XXA Other injury of unspecified body region, initial encounter: Secondary | ICD-10-CM

## 2020-06-30 DIAGNOSIS — L89150 Pressure ulcer of sacral region, unstageable: Secondary | ICD-10-CM

## 2020-06-30 DIAGNOSIS — L089 Local infection of the skin and subcutaneous tissue, unspecified: Secondary | ICD-10-CM

## 2020-06-30 LAB — BASIC METABOLIC PANEL
Anion gap: 12 (ref 5–15)
BUN: 15 mg/dL (ref 6–20)
CO2: 23 mmol/L (ref 22–32)
Calcium: 8.7 mg/dL — ABNORMAL LOW (ref 8.9–10.3)
Chloride: 104 mmol/L (ref 98–111)
Creatinine, Ser: 0.53 mg/dL (ref 0.44–1.00)
GFR, Estimated: >60 mL/min (ref >60)
Glucose, Bld: 108 mg/dL — ABNORMAL HIGH (ref 70–99)
Potassium: 3.5 mmol/L (ref 3.5–5.1)
Sodium: 139 mmol/L (ref 135–145)

## 2020-06-30 LAB — GLUCOSE, CAPILLARY
Glucose-Capillary: 109 mg/dL — ABNORMAL HIGH (ref 70–99)
Glucose-Capillary: 94 mg/dL (ref 70–99)
Glucose-Capillary: 121 mg/dL — ABNORMAL HIGH (ref 70–99)
Glucose-Capillary: 104 mg/dL — ABNORMAL HIGH (ref 70–99)

## 2020-06-30 LAB — MAGNESIUM: Magnesium: 2.2 mg/dL (ref 1.7–2.4)

## 2020-06-30 MED ORDER — POTASSIUM CHLORIDE 20 MEQ PO PACK
40.0000 meq | PACK | Freq: Once | ORAL | Status: AC
  Administered 2020-06-30: 11:00:00 40 meq via ORAL
  Filled 2020-06-30: qty 2

## 2020-06-30 NOTE — Progress Notes (Signed)
PROGRESS NOTE    Tina Griffith  [id number] DOB: [birth year] DOA: 06/16/2020 PCP: [facility]    Chief Complaint  Patient presents with  . Wound Infection  . Fatigue    Brief Narrative:  Tina Griffith is a 60 y.o. female with PMH significant for multiple sclerosis, movement disorder, hypertension, constipation and COVID-19 pneumonia in December 2021.  At baseline, she was using a walker and wheelchair up until [place] admission.  Since then she has remained bedbound at home and has developed sacral decubitus ulcers. 1/24, patient was brought to the ED with complaint of failure to thrive, worsening of gluteal/sacral pressure sores.  Since last hospitalization 4 weeks ago for Covid pneumonia, patient has had anorexia, weight loss, generalized fatigue and worsening lower extremity edema.  Patient was admitted for sepsis secondary to infected sacral decubitus ulcer. 1/27, patient underwent debridement of sacral decubitus ulcer by Dr. Harlow Asa.  Infectious disease consultation was obtained.  Patient was initially placed on broad-spectrum IV antibiotics.  Currently on 2 weeks course of oral antibiotics. General surgery and infectious disease signed off. Currently pending placement.   Assessment & Plan:   Principal Problem:   Sepsis with acute organ dysfunction without septic shock (HCC) Active Problems:   Multiple sclerosis (HCC)   Chronic kidney disease   Decubital ulcer   Essential (primary) hypertension   Hypomagnesemia   Anterior optic neuritis   Hemiplegia, spastic (HCC)   Leucocytosis   UTI (urinary tract infection)   Wound infection   Malnutrition of moderate degree   Anemia of chronic disease   Fever   Hypoxia   Impaction of colon ([place])  1 sepsis secondary to infected stage IV sacral decubitus ulcer/blood cultures positive for Bacteroides of ovatus Patient presented with stage IV sacral decubitus ulcer.  Seen in consultation by wound care nurse and general  surgery.  Status post debridement of sacral decubitus ulcer per general surgery 06/19/2020. 1/2 blood cultures from 06/16/2020 + for bacteriodes ovatus.  Repeat blood cultures 06/20/2020 negative to date. -ID consulted and was following initially patient was on IV ertapenem and subsequently IV daptomycin.  Patient transitioned to oral Augmentin and doxycycline for total of 2 weeks through 07/01/2020 per initial ID recommendation.   Leukocytosis fluctuating.  Repeat urinalysis unremarkable.  Urine cultures with insignificant growth. Repeat blood cultures pending with no growth to date x4 days.  Repeat chest x-ray done concerning for left-sided infiltrate.  Was on Augmentin and doxy which have been discontinued and patient on IV Zosyn x5 days per ID recommendations due to ongoing fevers. Currently afebrile. Will need outpatient follow-up with general surgery and ID on discharge.   2.  E. coli UTI Was on IV ertapenem as well as IV daptomycin.  Was on Augmentin and doxycycline as recommended per ID, however due to ongoing fevers IV antibiotics were changed to IV Zosyn per ID recs.  Follow.   3.  Fever Patient noted to have a temp of 102.2 (06/25/2020).  Repeat UA unremarkable.  Urine cultures with insignificant growth.  Repeat chest x-ray done concerning for left-sided infiltrate which was not there on admission chest x-ray.  Patient was on Augmentin and doxycycline.  Due to ongoing fevers ID consulted, abdominal films obtained concerning for constipation.  Blood cultures ordered and currently pending with no growth to date x4 days.  Antibiotics have been changed and patient currently on IV Zosyn.  Continues to be afebrile on IV antibiotics.  Supportive care.  ID was following.    4.  New oxygen dependence Patient noted to have new O2 requirements on 4 L nasal cannula.  Patient noted to have sats of 95% on room air on 06/25/2020..  Currently on room air with sats of 98-100%.  Patient noted to spike a temperature of  102 the evening of 06/25/2020.  Chest x-ray done concerning for pneumonia.  Patient was on oral antibiotics of Augmentin and doxy.  ID consulted antibiotics changed to IV Zosyn and ID recommending a total of 5 days of IV Zosyn.  5.  Multiple sclerosis Continue home regimen baclofen, dalfampridine, ocrelizumab.  Outpatient follow-up  6.  Pain management Continue current regimen of tramadol as needed, OxyIR as needed, Dilaudid as needed.  Minimize pain medication.  Follow.  7.  Hypertension Controlled on current regimen of Toprol-XL.  Continue to hold HCTZ.  Outpatient follow-up.    8.  Well-controlled diabetes mellitus type 2 Hemoglobin A1c 6.5 (06/18/2020).  CBG 109.  Sliding scale insulin.  Continue to hold Victoza and resume on discharge.    9.  Elevated troponin Patient noted to have elevated troponin up to 112 which trended down.  Patient asymptomatic.  2D echo with EF of 60 to 65%, mild LVH.  Continue aspirin and statin.  Outpatient follow-up.  10.  Hypokalemia/hypomagnesemia/hypophosphatemia Potassium at 3.5.  Magnesium at 2.2.  Potassium chloride 40 mEq p.o. x1.  Repeat labs in the morning.   11.  History of IBS Continue Trulance, PPI.    12.  Anemia of chronic disease/melena Patient noted to have a hemoglobin of 11.1 on admission which trended down and currently stabilized at 9.6.  Patient noted to have had a few episodes of melena however no recurrence.  Patient with no source of bleeding.  Continue PPI.  Follow H&H.  Transfusion threshold < 7.  13.  Constipation Smog enema x 1 ordered.  Patient having bowel movements.  MiraLAX changed to as needed.  Continue Senokot-S twice daily.  Follow.     DVT prophylaxis: Lovenox Code Status: Full Family Communication: Updated patient.  No family at bedside.  Disposition:   Status is: Inpatient    Dispo: The patient is from: Home              Anticipated d/c is to: SNF              Anticipated d/c date is: /07/02/2020               Patient currently on IV antibiotics.  Not stable for discharge.        Consultants:   Infectious disease: Dr. West Bali 06/20/2020  Wound care  General surgery: Dr.Gerkin 06/18/2021  Infectious disease: Dr. Johnnye Sima 06/26/2020  Procedures:   CT abdomen and pelvis 06/16/2020  Chest x-ray 06/16/2020, 06/24/2020, 06/26/2020  2D echo 06/18/2020  Sharp debridement of stage IV sacral decubitus ulcer with removal of necrotic skin, adipose tissue, fascia and muscle.  Approximate amount of tissue debridement is 15 cm x 10 cm x 5 cm per Dr. Harlow Asa general surgery 06/19/2020  KUB 06/26/2020  Antimicrobials:   Augmentin 06/22/2020>>>> 06/26/2020  Doxycycline 06/22/2020>>>>> 06/26/2020  Doxycycline 06/19/2020>>>> 06/20/2020  IV daptomycin 06/20/2020>>>> 06/22/2020  IV Invanz 06/20/2020>>>> 06/22/2020  IV Zosyn 06/17/2020>>>> 06/20/2020  IV vancomycin 06/16/2020>>>>> 06/19/2020  IV Zosyn 06/26/2020>>>>   Subjective: Laying in bed.  Awake.  Denies chest pain or shortness of breath.  Afebrile.   Objective: Vitals:   06/29/20 1815 06/29/20 2122 06/30/20 0529 06/30/20 0751  BP: (!) 94/53 (!) 116/59 132/71  Pulse: 100 (!) 105 (!) 103   Resp: 20 18 16    Temp: 98 F (36.7 C) 99 F (37.2 C) 99 F (37.2 C)   TempSrc: Oral Oral Oral   SpO2: 97% 97% 100% 98%  Weight:      Height:        Intake/Output Summary (Last 24 hours) at 06/30/2020 1109 Last data filed at 06/30/2020 0930 Gross per 24 hour  Intake 1331.42 ml  Output 1200 ml  Net 131.42 ml   Filed Weights   06/17/20 1145 06/18/20 0500 06/19/20 0844  Weight: 74.8 kg 81.8 kg 81.8 kg    Examination:  General exam: NAD Respiratory system: CTA B anterior lung fields.  No wheezes, no crackles, no rhonchi.  Normal respiratory effort.  Cardiovascular system: Regular rate rhythm no murmurs rubs or gallops.  No JVD.  No lower extremity edema. Gastrointestinal system: Abdomen is obese, soft, nontender, nondistended, positive bowel sounds.  No rebound.   No guarding.  Central nervous system: Alert and oriented. No focal neurological deficits. Extremities: Symmetric 5 x 5 power. Skin: No rashes, lesions or ulcers Psychiatry: Judgement and insight appear normal. Mood & affect appropriate.     Data Reviewed: I have personally reviewed following labs and imaging studies  CBC: Recent Labs  Lab 06/24/20 0517 06/26/20 0450 06/27/20 0505 06/28/20 0536 06/29/20 0758  WBC 12.3* 13.0* 14.3* 12.1* 15.5*  NEUTROABS 9.7* 9.3* 10.6* 8.3* 11.7*  HGB 8.5* 8.9* 8.5* 8.4* 9.6*  HCT 25.8* 28.1* 26.5* 26.1* 29.4*  MCV 81.9 83.9 81.5 81.6 79.9*  PLT 545* 565* 643* 683* 697*    Basic Metabolic Panel: Recent Labs  Lab 06/24/20 0517 06/26/20 0450 06/27/20 0505 06/28/20 0536 06/29/20 0758 06/30/20 0431  NA 136 136 136 135 138 139  K 3.9 3.8 4.0 3.9 4.1 3.5  CL 100 104 99 102 103 104  CO2 26 23 23 25 23 23   GLUCOSE 102* 111* 113* 95 110* 108*  BUN 11 11 10 11 11 15   CREATININE 0.37* 0.66 0.55 0.55 0.58 0.53  CALCIUM 8.6* 8.7* 8.8* 8.6* 8.9 8.7*  MG 1.7 1.7 1.7 1.9 1.6* 2.2  PHOS 2.2* 3.0 2.8  --   --   --     GFR: Estimated Creatinine Clearance: 80 mL/min (by C-G formula based on SCr of 0.53 mg/dL).  Liver Function Tests: Recent Labs  Lab 06/26/20 0450 06/27/20 0505  ALBUMIN 2.0* 2.1*    CBG: Recent Labs  Lab 06/29/20 0721 06/29/20 1158 06/29/20 1700 06/29/20 2148 06/30/20 0733  GLUCAP 116* 133* 121* 115* 109*     Recent Results (from the past 240 hour(s))  Culture, Urine     Status: Abnormal   Collection Time: 06/26/20  2:37 PM   Specimen: Urine, Random  Result Value Ref Range Status   Specimen Description   Final    URINE, RANDOM Performed at [facility] [address], [place], [place] [address]    Special Requests   Final    NONE Performed at [facility], [place] [address] [place], [place] [address]    Culture (A)  Final    <10,000 COLONIES/mL INSIGNIFICANT  GROWTH Performed at [facility] [address] [place],  [address]    Report Status 06/28/2020 FINAL  Final  Culture, blood (Routine X 2) w Reflex to ID Panel     Status: None (Preliminary result)   Collection Time: 06/26/20  5:22 PM   Specimen: BLOOD RIGHT HAND  Result Value Ref Range Status   Specimen Description   Final    BLOOD RIGHT HAND Performed at [facility] [address], [place], [name] [address]    Special Requests   Final    BOTTLES DRAWN AEROBIC ONLY Blood Culture results may not be optimal due to an inadequate volume of blood received in culture bottles Performed at [facility] [address] [place], [place] [address]    Culture   Final    NO GROWTH 4 DAYS Performed at [facility], [place] [address], [place], [place] [address]    Report Status PENDING  Incomplete  Culture, blood (Routine X 2) w Reflex to ID Panel     Status: None (Preliminary result)   Collection Time: 06/26/20  5:22 PM   Specimen: BLOOD RIGHT HAND  Result Value Ref Range Status   Specimen Description   Final    BLOOD RIGHT HAND Performed at [facility] [address] [place], [place] [address]    Special Requests   Final    BOTTLES DRAWN AEROBIC ONLY Blood Culture results may not be optimal due to an inadequate volume of blood received in culture bottles Performed at [facility] [address] [place], [place] [address]    Culture   Final    NO GROWTH 4 DAYS Performed at [facility], [place] [address], [place],  [address]    Report Status PENDING  Incomplete         Radiology Studies: No results found.      Scheduled Meds: . (feeding supplement) PROSource Plus  30 mL Oral TID WC  . aspirin EC  81 mg Oral Daily  . benzonatate  100 mg Oral TID  . Chlorhexidine Gluconate Cloth  6 each Topical Daily  . dalfampridine  10 mg Oral BID  . enoxaparin (LOVENOX)  injection  40 mg Subcutaneous Q24H  . ferrous sulfate  325 mg Oral BID  . fluticasone furoate-vilanterol  1 puff Inhalation Daily  . insulin aspart  0-5 Units Subcutaneous QHS  . insulin aspart  0-9 Units Subcutaneous TID WC  . metoprolol succinate  100 mg Oral Daily  . multivitamin with minerals  1 tablet Oral Daily  . oxybutynin  10 mg Oral Daily  . pantoprazole  40 mg Oral Daily  . Plecanatide  1 tablet Oral Daily  . potassium chloride  40 mEq Oral Once  . pravastatin  20 mg Oral q1800  . senna-docusate  1 tablet Oral BID  . sorbitol, milk of mag, mineral oil, glycerin (SMOG) enema  960 mL Rectal Once  . tiZANidine  8 mg Oral TID   Continuous Infusions: . piperacillin-tazobactam (ZOSYN)  IV 3.375 g (06/30/20 0220)     LOS: 14 days    Time spent: 40 minutes    Irine Seal, MD [facility]   To contact the attending provider between 7A-7P or the covering provider during after hours 7P-7A, please log into the web site [email or web address] and access using universal [place] password for that web site. If you do not have the password, please call the hospital operator.  06/30/2020, 11:09 AM

## 2020-06-30 NOTE — Progress Notes (Signed)
  Speech Language Pathology Treatment: Dysphagia  Patient Details Name: Tina Griffith MRN: [id number] DOB: 17-Jan-1961 Today's Date: 06/30/2020 Time: 2706-2376 SLP Time Calculation (min) (ACUTE ONLY): 20 min  Assessment / Plan / Recommendation Clinical Impression  Pt was seen for follow up after BSE completed 2.4.22. Pt was seen during lunch with Dys3 solids and thin liquids. Pt exhibits extended oral prep and frequently requests liquid wash to clear oral cavity. No overt s/s aspiration observed on any texture. Pt was encouraged to remain elevated for 30 minutes to facilitate esophageal clearing. Will continue current diet. No further ST intervention recommended at this time.    HPI HPI: 60 y.o. African-American female presented to the [facility] Emergency Department via EMS with complaints of failure to thrive and worsening of gluteal/sacral pressure sores.  The patient's daughter is at the bedside and is able to help in providing medical history.  The patient was recently discharged from a hospitalization for treatment of COVID-19 pneumonia at the end of December 2021, during which time she developed her pressure sores.  She was discharged home but has experienced generalized fatigue, anorexia and increasing lower extremity edema.      SLP Plan  Discharge SLP treatment due to goals met      Recommendations  Diet recommendations: Dysphagia 3 (mechanical soft);Thin liquid Liquids provided via: Straw Medication Administration: Whole meds with puree Supervision: Full supervision/cueing for compensatory strategies;Trained caregiver to feed patient Compensations: Minimize environmental distractions;Slow rate;Small sips/bites Postural Changes and/or Swallow Maneuvers: Seated upright 90 degrees;Upright 30-60 min after meal                Oral Care Recommendations: Oral care BID Follow up Recommendations: None SLP Visit Diagnosis: Dysphagia, unspecified (R13.10) Plan:  Discharge SLP treatment due to (comment)       GO              Tina Griffith B. Quentin Ore, [facility], [place] Speech Language Pathologist Office: [phone] Pager: [phone]  Shonna Chock 06/30/2020, 2:15 PM

## 2020-06-30 NOTE — Care Management Important Message (Signed)
Important Message  Patient Details IM Letter given to the Patient. Name: Tina Griffith MRN: [id number] Date of Birth: [birth year]   Medicare Important Message Given:  Yes     Kerin Salen 06/30/2020, 3:17 PM

## 2020-07-01 LAB — CULTURE, BLOOD (ROUTINE X 2)
Culture: NO GROWTH
Culture: NO GROWTH

## 2020-07-01 LAB — CBC WITH DIFFERENTIAL/PLATELET
Abs Immature Granulocytes: 0.38 10*3/uL — ABNORMAL HIGH (ref 0.00–0.07)
Basophils Absolute: 0.2 10*3/uL — ABNORMAL HIGH (ref 0.0–0.1)
Basophils Relative: 1 %
Eosinophils Absolute: 0.5 10*3/uL (ref 0.0–0.5)
Eosinophils Relative: 3 %
HCT: 27.9 % — ABNORMAL LOW (ref 36.0–46.0)
Hemoglobin: 8.8 g/dL — ABNORMAL LOW (ref 12.0–15.0)
Immature Granulocytes: 2 %
Lymphocytes Relative: 18 %
Lymphs Abs: 3 10*3/uL (ref 0.7–4.0)
MCH: 26.3 pg (ref 26.0–34.0)
MCHC: 31.5 g/dL (ref 30.0–36.0)
MCV: 83.5 fL (ref 80.0–100.0)
Monocytes Absolute: 1.3 10*3/uL — ABNORMAL HIGH (ref 0.1–1.0)
Monocytes Relative: 8 %
Neutro Abs: 10.9 10*3/uL — ABNORMAL HIGH (ref 1.7–7.7)
Neutrophils Relative %: 68 %
Platelets: 722 10*3/uL — ABNORMAL HIGH (ref 150–400)
RBC: 3.34 MIL/uL — ABNORMAL LOW (ref 3.87–5.11)
RDW: 21 % — ABNORMAL HIGH (ref 11.5–15.5)
WBC: 16.1 10*3/uL — ABNORMAL HIGH (ref 4.0–10.5)
nRBC: 0.2 % (ref 0.0–0.2)

## 2020-07-01 LAB — RENAL FUNCTION PANEL
Albumin: 2.1 g/dL — ABNORMAL LOW (ref 3.5–5.0)
Anion gap: 10 (ref 5–15)
BUN: 10 mg/dL (ref 6–20)
CO2: 24 mmol/L (ref 22–32)
Calcium: 9.1 mg/dL (ref 8.9–10.3)
Chloride: 106 mmol/L (ref 98–111)
Creatinine, Ser: 0.64 mg/dL (ref 0.44–1.00)
GFR, Estimated: >60 mL/min (ref >60)
Glucose, Bld: 105 mg/dL — ABNORMAL HIGH (ref 70–99)
Phosphorus: 3.1 mg/dL (ref 2.5–4.6)
Potassium: 3.6 mmol/L (ref 3.5–5.1)
Sodium: 140 mmol/L (ref 135–145)

## 2020-07-01 LAB — GLUCOSE, CAPILLARY
Glucose-Capillary: 95 mg/dL (ref 70–99)
Glucose-Capillary: 113 mg/dL — ABNORMAL HIGH (ref 70–99)
Glucose-Capillary: 115 mg/dL — ABNORMAL HIGH (ref 70–99)
Glucose-Capillary: 120 mg/dL — ABNORMAL HIGH (ref 70–99)

## 2020-07-01 LAB — MAGNESIUM: Magnesium: 1.8 mg/dL (ref 1.7–2.4)

## 2020-07-01 MED ORDER — MAGNESIUM SULFATE 4 GM/100ML IV SOLN
4.0000 g | Freq: Once | INTRAVENOUS | Status: AC
  Administered 2020-07-01: 4 g via INTRAVENOUS
  Filled 2020-07-01: qty 100

## 2020-07-01 MED ORDER — PIPERACILLIN-TAZOBACTAM 3.375 G IVPB
3.3750 g | Freq: Three times a day (TID) | INTRAVENOUS | Status: AC
  Administered 2020-07-01 – 2020-07-02 (×3): 3.375 g via INTRAVENOUS
  Filled 2020-07-01 (×3): qty 50

## 2020-07-01 NOTE — Progress Notes (Signed)
PT Cancellation Note  Patient Details Name: Tina Griffith MRN: [id number] DOB: [birth year]   Cancelled Treatment:     pt declined any activity, stated she just took some pain pills and just got comfortable and wants to sleep.  Pt has been evaluated with rec for SNF.    Nathanial Rancher 07/01/2020, 10:35 AM

## 2020-07-01 NOTE — Progress Notes (Signed)
PROGRESS NOTE    Tina Griffith  SFK:[id number] DOB: [birth year] DOA: 06/16/2020 PCP: [facility]    Chief Complaint  Patient presents with  . Wound Infection  . Fatigue    Brief Narrative:  Tina Griffith is a 60 y.o. female with PMH significant for multiple sclerosis, movement disorder, hypertension, constipation and COVID-19 pneumonia in December 2021.  At baseline, she was using a walker and wheelchair up until [place] admission.  Since then she has remained bedbound at home and has developed sacral decubitus ulcers. 1/24, patient was brought to the ED with complaint of failure to thrive, worsening of gluteal/sacral pressure sores.  Since last hospitalization 4 weeks ago for Covid pneumonia, patient has had anorexia, weight loss, generalized fatigue and worsening lower extremity edema.  Patient was admitted for sepsis secondary to infected sacral decubitus ulcer. 1/27, patient underwent debridement of sacral decubitus ulcer by Dr. Harlow Asa.  Infectious disease consultation was obtained.  Patient was initially placed on broad-spectrum IV antibiotics.  Currently on 2 weeks course of oral antibiotics. General surgery and infectious disease signed off. Currently pending placement.   Assessment & Plan:   Principal Problem:   Sepsis with acute organ dysfunction without septic shock (HCC) Active Problems:   Multiple sclerosis (HCC)   Chronic kidney disease   Decubital ulcer   Essential (primary) hypertension   Hypomagnesemia   Anterior optic neuritis   Hemiplegia, spastic (HCC)   Leucocytosis   UTI (urinary tract infection)   Wound infection   Malnutrition of moderate degree   Anemia of chronic disease   Fever   Hypoxia   Impaction of colon ([place])  1 sepsis secondary to infected stage IV sacral decubitus ulcer/blood cultures positive for Bacteroides of ovatus Patient presented with stage IV sacral decubitus ulcer.  Seen in consultation by wound care nurse and general  surgery.  Status post debridement of sacral decubitus ulcer per general surgery 06/19/2020. 1/2 blood cultures from 06/16/2020 + for bacteriodes ovatus.  Repeat blood cultures 06/20/2020 negative to date. -ID consulted and was following initially patient was on IV ertapenem and subsequently IV daptomycin.  Patient transitioned to oral Augmentin and doxycycline for total of 2 weeks through 07/01/2020 per initial ID recommendation.   Leukocytosis fluctuating.  Repeat urinalysis unremarkable.  Urine cultures with insignificant growth. Repeat blood cultures negative.  Repeat chest x-ray done concerning for left-sided infiltrate.  Was on Augmentin and doxy which have been discontinued and patient on IV Zosyn x5 days per ID recommendations due to ongoing fevers. Currently afebrile. Will need outpatient follow-up with general surgery and ID on discharge.   2.  E. coli UTI Was on IV ertapenem as well as IV daptomycin.  Was on Augmentin and doxycycline as recommended per ID, however due to ongoing fevers IV antibiotics were changed to IV Zosyn per ID recs.  Follow.   3.  Fever Patient noted to have a temp of 102.2 (06/25/2020).  Repeat UA unremarkable.  Urine cultures with insignificant growth.  Repeat chest x-ray done concerning for left-sided infiltrate which was not there on admission chest x-ray.  Patient was on Augmentin and doxycycline.  Due to ongoing fevers ID consulted, abdominal films obtained concerning for constipation.  Repeat blood cultures negative.  Antibiotics have been changed and patient currently on IV Zosyn.  Continues to be afebrile on IV antibiotics.  Supportive care.  ID was following.    4.  New oxygen dependence Patient noted to have new O2 requirements on 4 L nasal cannula.  Patient noted to have sats of 95% on room air on 06/25/2020..  Currently on room air with sats of 99-100%.  Patient noted to spike a temperature of 102 the evening of 06/25/2020.  Chest x-ray done concerning for pneumonia.   Patient was on oral antibiotics of Augmentin and doxy.  ID consulted antibiotics changed to IV Zosyn and ID recommending a total of 5 days of IV Zosyn.  5.  Multiple sclerosis Stable.  Continue home regimen of baclofen, dalfampridine, ocrelizumab.  Outpatient follow-up  6.  Pain management Continue current regimen of tramadol as needed, OxyIR as needed, Dilaudid as needed.  Minimize pain medication.  Follow.  7.  Hypertension Controlled on current regimen of Toprol-XL.  Continue to hold HCTZ.  Outpatient follow-up.    8.  Well-controlled diabetes mellitus type 2 Hemoglobin A1c 6.5 (06/18/2020).  CBG 95.  Sliding scale insulin.  Continue to hold Victoza and resume on discharge.   9.  Elevated troponin Patient noted to have elevated troponin up to 112 which trended down.  Patient asymptomatic.  2D echo with EF of 60 to 65%, mild LVH.  Continue aspirin and statin.  Outpatient follow-up.  10.  Hypokalemia/hypomagnesemia/hypophosphatemia Potassium at 3.6.  Magnesium at 1.8.  Magnesium sulfate 4 g IV x1.  Follow.   11.  History of IBS Continue Trulance, PPI.    12.  Anemia of chronic disease/melena Patient noted to have a hemoglobin of 11.1 on admission which trended down and currently 8.8.  Patient noted early on during the hospitalization to have a few episodes of melena which have since resolved.  No source of bleeding.  PPI.  Transfusion threshold hemoglobin < 7.   13.  Constipation Smog enema x 1 ordered.  Patient having bowel movements.  MiraLAX changed to as needed.  Continue Senokot-S twice daily.  Follow.     DVT prophylaxis: Lovenox Code Status: Full Family Communication: Updated patient.  No family at bedside.  Disposition:   Status is: Inpatient    Dispo: The patient is from: Home              Anticipated d/c is to: SNF              Anticipated d/c date is: /07/02/2020              Patient currently on IV antibiotics.  Not stable for discharge.        Consultants:    Infectious disease: Dr. [place] Bali 06/20/2020  Wound care  General surgery: Dr.Gerkin 06/18/2021  Infectious disease: Dr. Johnnye Sima 06/26/2020  Procedures:   CT abdomen and pelvis 06/16/2020  Chest x-ray 06/16/2020, 06/24/2020, 06/26/2020  2D echo 06/18/2020  Sharp debridement of stage IV sacral decubitus ulcer with removal of necrotic skin, adipose tissue, fascia and muscle.  Approximate amount of tissue debridement is 15 cm x 10 cm x 5 cm per Dr. Harlow Asa general surgery 06/19/2020  KUB 06/26/2020  Antimicrobials:   Augmentin 06/22/2020>>>> 06/26/2020  Doxycycline 06/22/2020>>>>> 06/26/2020  Doxycycline 06/19/2020>>>> 06/20/2020  IV daptomycin 06/20/2020>>>> 06/22/2020  IV Invanz 06/20/2020>>>> 06/22/2020  IV Zosyn 06/17/2020>>>> 06/20/2020  IV vancomycin 06/16/2020>>>>> 06/19/2020  IV Zosyn 06/26/2020>>>>   Subjective: Laying in bed.  Denies chest pain or shortness of breath.  No abdominal pain.  Afebrile.   Objective: Vitals:   06/30/20 1347 06/30/20 2152 07/01/20 0526 07/01/20 0738  BP: 131/84 131/70 134/84   Pulse: (!) 107 (!) 107 (!) 110   Resp: 16 18 18    Temp: 99.6 F (37.6  C) 98.8 F (37.1 C) 98.2 F (36.8 C)   TempSrc: Oral Oral Oral   SpO2: 95% 94% 100% 99%  Weight:      Height:        Intake/Output Summary (Last 24 hours) at 07/01/2020 1305 Last data filed at 07/01/2020 1000 Gross per 24 hour  Intake 793.88 ml  Output 1350 ml  Net -556.12 ml   Filed Weights   06/17/20 1145 06/18/20 0500 06/19/20 0844  Weight: 74.8 kg 81.8 kg 81.8 kg    Examination:  General exam: NAD Respiratory system: Lungs clear to auscultation bilaterally anterior lung fields.  No wheezes, no crackles, no rhonchi.  Normal respiratory effort.  Cardiovascular system: RRR no murmurs rubs or gallops.  No JVD.  No lower extremity edema.  Gastrointestinal system: Abdomen is soft, nontender, nondistended, obese, positive bowel sounds.  No rebound.  No guarding. Central nervous system: Alert and  oriented. No focal neurological deficits. Extremities: Symmetric 5 x 5 power. Skin: No rashes, lesions or ulcers Psychiatry: Judgement and insight appear normal. Mood & affect appropriate.     Data Reviewed: I have personally reviewed following labs and imaging studies  CBC: Recent Labs  Lab 06/26/20 0450 06/27/20 0505 06/28/20 0536 06/29/20 0758 07/01/20 0450  WBC 13.0* 14.3* 12.1* 15.5* 16.1*  NEUTROABS 9.3* 10.6* 8.3* 11.7* 10.9*  HGB 8.9* 8.5* 8.4* 9.6* 8.8*  HCT 28.1* 26.5* 26.1* 29.4* 27.9*  MCV 83.9 81.5 81.6 79.9* 83.5  PLT 565* 643* 683* 697* 722*    Basic Metabolic Panel: Recent Labs  Lab 06/26/20 0450 06/27/20 0505 06/28/20 0536 06/29/20 0758 06/30/20 0431 07/01/20 0450  NA 136 136 135 138 139 140  K 3.8 4.0 3.9 4.1 3.5 3.6  CL 104 99 102 103 104 106  CO2 23 23 25 23 23 24   GLUCOSE 111* 113* 95 110* 108* 105*  BUN 11 10 11 11 15 10   CREATININE 0.66 0.55 0.55 0.58 0.53 0.64  CALCIUM 8.7* 8.8* 8.6* 8.9 8.7* 9.1  MG 1.7 1.7 1.9 1.6* 2.2 1.8  PHOS 3.0 2.8  --   --   --  3.1    GFR: Estimated Creatinine Clearance: 80 mL/min (by C-G formula based on SCr of 0.64 mg/dL).  Liver Function Tests: Recent Labs  Lab 06/26/20 0450 06/27/20 0505 07/01/20 0450  ALBUMIN 2.0* 2.1* 2.1*    CBG: Recent Labs  Lab 06/30/20 1124 06/30/20 1626 06/30/20 2156 07/01/20 0735 07/01/20 1131  GLUCAP 94 121* 104* 95 113*     Recent Results (from the past 240 hour(s))  Culture, Urine     Status: Abnormal   Collection Time: 06/26/20  2:37 PM   Specimen: Urine, Random  Result Value Ref Range Status   Specimen Description   Final    URINE, RANDOM Performed at [facility] [address], [place], [place] [address]    Special Requests   Final    NONE Performed at [facility], [place] [address] [place], [place] [address]    Culture (A)  Final    <10,000 COLONIES/mL INSIGNIFICANT GROWTH Performed at [facility] [address], [place], [place] [address]    Report Status 06/28/2020 FINAL  Final  Culture, blood (Routine X 2) w Reflex to ID Panel     Status: None   Collection Time: 06/26/20  5:22 PM   Specimen: BLOOD RIGHT HAND  Result Value Ref Range Status   Specimen Description   Final  BLOOD RIGHT HAND Performed at [facility], [place] [address] [place], [place] [address]    Special Requests   Final    BOTTLES DRAWN AEROBIC ONLY Blood Culture results may not be optimal due to an inadequate volume of blood received in culture bottles Performed at [facility] [address], [place], [place] [address]    Culture   Final    NO GROWTH 5 DAYS Performed at [facility], [place] [address], [place], [place] [address]    Report Status 07/01/2020 FINAL  Final  Culture, blood (Routine X 2) w Reflex to ID Panel     Status: None   Collection Time: 06/26/20  5:22 PM   Specimen: BLOOD RIGHT HAND  Result Value Ref Range Status   Specimen Description   Final    BLOOD RIGHT HAND Performed at [facility] [address], [place], [place] [address]    Special Requests   Final    BOTTLES DRAWN AEROBIC ONLY Blood Culture results may not be optimal due to an inadequate volume of blood received in culture bottles Performed at [facility] [address], [place], [place] [address]    Culture   Final    NO GROWTH 5 DAYS Performed at [facility], [place] [address], [place], [place] [address]    Report Status 07/01/2020 FINAL  Final         Radiology Studies: No results found.      Scheduled Meds: . (feeding supplement) PROSource Plus  30 mL Oral TID WC  . aspirin EC  81 mg Oral Daily  . benzonatate  100 mg Oral TID  . Chlorhexidine Gluconate Cloth  6 each Topical Daily  . dalfampridine  10 mg Oral BID  . enoxaparin (LOVENOX) injection  40 mg Subcutaneous Q24H  . ferrous sulfate  325 mg Oral BID  . fluticasone  furoate-vilanterol  1 puff Inhalation Daily  . insulin aspart  0-5 Units Subcutaneous QHS  . insulin aspart  0-9 Units Subcutaneous TID WC  . metoprolol succinate  100 mg Oral Daily  . multivitamin with minerals  1 tablet Oral Daily  . oxybutynin  10 mg Oral Daily  . pantoprazole  40 mg Oral Daily  . Plecanatide  1 tablet Oral Daily  . pravastatin  20 mg Oral q1800  . senna-docusate  1 tablet Oral BID  . sorbitol, milk of mag, mineral oil, glycerin (SMOG) enema  960 mL Rectal Once  . tiZANidine  8 mg Oral TID   Continuous Infusions:    LOS: 15 days    Time spent: 40 minutes    Irine Seal, MD [facility]   To contact the attending provider between 7A-7P or the covering provider during after hours 7P-7A, please log into the web site [email or web address] and access using universal [place] password for that web site. If you do not have the password, please call the hospital operator.  07/01/2020, 1:05 PM

## 2020-07-01 NOTE — TOC Progression Note (Signed)
Transition of Care [facility]) - Progression Note    Patient Details  Name: Tina Griffith MRN: [id number] Date of Birth: 08-28-1960  Transition of [facility]) CM/SW Contact  Lennart Pall, LCSW Phone Number: 07/01/2020, 4:13 PM  Clinical Narrative:    Per MD, pt reaching medical readiness again for SNF transition.  Have resubmitted FL2 to area facilities.    Expected Discharge Plan: [place] (vs home with [facility]) Barriers to Discharge: Continued Medical Work up  Expected Discharge Plan and Services Expected Discharge Plan: [place] (vs home with [facility]) In-house Referral: Clinical Social Work     Living arrangements for the past 2 months: Single Family Home                                       Social Determinants of Health (SDOH) Interventions    Readmission Risk Interventions Readmission Risk Prevention Plan 06/18/2020  Transportation Screening Complete  PCP or Specialist Appt within 5-7 Days Complete  Home Care Screening Complete  Medication Review (RN CM) Complete  Some recent data might be hidden

## 2020-07-01 NOTE — NC FL2 (Signed)
[place] LEVEL OF CARE SCREENING TOOL     IDENTIFICATION  Patient Name: Tina Griffith Birthdate: [birth year] Sex: female Admission Date (Current Location): 06/16/2020  [place] and [place] Number:  [name] [id number] [place] and Address:  [facility],  [place], [place]      Provider Number: [id number]  Attending Physician Name and Address:  Eugenie Filler, MD  Relative Name and Phone Number:  daughter, [name] @ [phone]    Current Level of Care: Hospital Recommended Level of Care: [place] Prior Approval Number:    Date Approved/Denied:   PASRR Number: [id number]  Discharge Plan: SNF    Current Diagnoses: Patient Active Problem List   Diagnosis Date Noted  . Fever 06/26/2020  . Hypoxia   . Impaction of colon ([place])   . Anemia of chronic disease   . Malnutrition of moderate degree 06/19/2020  . Wound infection 06/17/2020  . Leucocytosis 06/16/2020  . Sepsis with acute organ dysfunction without septic shock ([place]) 06/16/2020  . UTI (urinary tract infection) 06/16/2020  . Depression with anxiety 04/20/2016  . Insomnia 04/20/2016  . Chronic prescription opiate use 01/16/2016  . Hypertriglyceridemia 06/20/2015  . Abnormal urine odor 10/09/2014  . Chronic kidney disease 10/09/2014  . Elevated WBC count 10/09/2014  . Multiple sclerosis ([place]) 07/25/2014  . Spastic gait 07/25/2014  . Leg pain, left 07/25/2014  . Urinary frequency 07/25/2014  . Constipation 07/25/2014  . Decreased potassium in the blood 04/22/2014  . Hypomagnesemia 04/22/2014  . Ataxic gait 02/28/2014  . Callosity 02/28/2014  . 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 Contracture of ankle and foot joint 02/28/2014  . Decubital ulcer 02/28/2014  . Fibroid 02/28/2014  . Dysfunctional or functional uterine hemorrhage 02/28/2014  . Incisional hernia with obstruction but no gangrene 02/28/2014  . Gonalgia 02/28/2014  .  Extremity pain 02/28/2014  . Decreased motor strength 02/28/2014  . Non-pressure ulcer of lower extremity ([place]) 02/28/2014  . Loss of feeling or sensation 02/28/2014  . Anterior optic neuritis 02/28/2014  . Hemorrhage, postmenopausal 02/28/2014  . Arthritis of knee, degenerative 02/28/2014  . AS (sickle cell trait) ([place]) 02/28/2014  . Hemiplegia, spastic ([place]) 02/28/2014  . Sickle cell trait ([place]) 02/28/2014  . Spastic hemiplegia ([place]) 02/28/2014  . Abnormal results of thyroid function studies 12/28/2013  . Allergic rhinitis 08/22/2013  . Disorder of magnesium metabolism 08/22/2013  . Essential (primary) hypertension 08/22/2013  . Hypercholesteremia 08/22/2013  . Adult hypothyroidism 08/22/2013  . Anemia, iron deficiency 08/22/2013  . Compulsive tobacco user syndrome 08/22/2013  . Absence of bladder continence 08/22/2013  . Hypercholesterolemia 08/22/2013  . Current tobacco use 08/22/2013  . Accumulation of fluid in tissues 11/29/2012  . Dermatophytic onychia 10/28/2012  . Colon, diverticulosis 10/28/2012  . Polypharmacy 10/28/2012  . DS (disseminated sclerosis) ([place]) 10/28/2012  . Arthralgia of lower leg 10/28/2012  . Menopausal symptom 10/28/2012  . Current tear of lateral cartilage or meniscus of knee 10/28/2012  . Diverticular disease of large intestine 10/28/2012  . Other long term (current) drug therapy 10/28/2012  . Abnormal uterine bleeding 06/26/2012  . Baseball finger 08/03/2010    Orientation RESPIRATION BLADDER Height & Weight     Self,Time,Place,Situation  Normal Incontinent Weight: 180 lb 6.4 oz (81.8 kg) Height:  5\' 5"  (165.1 cm)  BEHAVIORAL SYMPTOMS/MOOD NEUROLOGICAL BOWEL NUTRITION STATUS      Incontinent    AMBULATORY STATUS COMMUNICATION OF NEEDS Skin   Total Care Verbally PU Stage and Appropriate Care  PU Stage 4 Dressing: BID (Moist-to-dry dressing changes to sacral wound TWICE DAILY using kerlix, normal saline, ABDs.)                Personal Care Assistance Level of Assistance  Bathing,Dressing,Feeding Bathing Assistance: Maximum assistance Feeding assistance: Limited assistance Dressing Assistance: Maximum assistance     Functional Limitations Info             SPECIAL CARE FACTORS FREQUENCY  PT (By licensed PT),OT (By licensed OT)     PT Frequency: 5x/wk OT Frequency: 5x/wk            Contractures Contractures Info: Not present    Additional Factors Info  Code Status,Allergies Code Status Info: Full Allergies Info: NKDA           Current Medications (07/01/2020):  This is the current hospital active medication list Current Facility-Administered Medications  Medication Dose Route Frequency Provider Last Rate Last Admin  . (feeding supplement) PROSource Plus liquid 30 mL  30 mL Oral TID WC Armandina Gemma, MD   30 mL at 07/01/20 1422  . acetaminophen (TYLENOL) tablet 650 mg  650 mg Oral Q6H PRN Armandina Gemma, MD   650 mg at 07/01/20 0023   Or  . acetaminophen (TYLENOL) suppository 650 mg  650 mg Rectal Q6H PRN Armandina Gemma, MD      . aspirin EC tablet 81 mg  81 mg Oral Daily Armandina Gemma, MD   81 mg at 07/01/20 1010  . benzonatate (TESSALON) capsule 100 mg  100 mg Oral TID Armandina Gemma, MD   100 mg at 07/01/20 1609  . Chlorhexidine Gluconate Cloth 2 % PADS 6 each  6 each Topical Daily Armandina Gemma, MD   6 each at 07/01/20 1424  . dalfampridine TB12 10 mg  10 mg Oral BID Armandina Gemma, MD   10 mg at 06/30/20 2112  . enoxaparin (LOVENOX) injection 40 mg  40 mg Subcutaneous Q24H Armandina Gemma, MD   40 mg at 07/01/20 0849  . ferrous sulfate tablet 325 mg  325 mg Oral BID Armandina Gemma, MD   325 mg at 07/01/20 1011  . fluticasone furoate-vilanterol (BREO ELLIPTA) 100-25 MCG/INH 1 puff  1 puff Inhalation Daily Armandina Gemma, MD   1 puff at 07/01/20 [phone]  . insulin aspart (novoLOG) injection 0-5 Units  0-5 Units Subcutaneous QHS Armandina Gemma, MD      . insulin aspart (novoLOG) injection 0-9 Units  0-9 Units  Subcutaneous TID WC Armandina Gemma, MD   1 Units at 06/30/20 1743  . liver oil-zinc oxide (DESITIN) 40 % ointment   Topical TID PRN Armandina Gemma, MD      . metoprolol succinate (TOPROL-XL) 24 hr tablet 100 mg  100 mg Oral Daily Armandina Gemma, MD   100 mg at 07/01/20 1010  . multivitamin with minerals tablet 1 tablet  1 tablet Oral Daily Armandina Gemma, MD   1 tablet at 07/01/20 1010  . ondansetron (ZOFRAN-ODT) disintegrating tablet 4 mg  4 mg Oral Q6H PRN Armandina Gemma, MD       Or  . ondansetron (ZOFRAN) injection 4 mg  4 mg Intravenous Q6H PRN Armandina Gemma, MD      . oxybutynin (DITROPAN-XL) 24 hr tablet 10 mg  10 mg Oral Daily Armandina Gemma, MD   10 mg at 07/01/20 1011  . oxyCODONE (Oxy IR/ROXICODONE) immediate release tablet 5-10 mg  5-10 mg Oral Q4H PRN Armandina Gemma, MD   10 mg at 07/01/20 1423  .  pantoprazole (PROTONIX) EC tablet 40 mg  40 mg Oral Daily Armandina Gemma, MD   40 mg at 07/01/20 1010  . piperacillin-tazobactam (ZOSYN) IVPB 3.375 g  3.375 g Intravenous Q8H Eugenie Filler, MD      . Plecanatide TABS 1 tablet  1 tablet Oral Daily Armandina Gemma, MD   1 tablet at 06/29/20 1052  . polyethylene glycol (MIRALAX / GLYCOLAX) packet 17 g  17 g Oral Daily PRN Eugenie Filler, MD      . pravastatin (PRAVACHOL) tablet 20 mg  20 mg Oral q1800 Armandina Gemma, MD   20 mg at 06/30/20 1836  . senna-docusate (Senokot-S) tablet 1 tablet  1 tablet Oral BID Eugenie Filler, MD   1 tablet at 07/01/20 1011  . sodium chloride flush (NS) 0.9 % injection 10-40 mL  10-40 mL Intracatheter PRN Armandina Gemma, MD      . sorbitol, milk of mag, mineral oil, glycerin (SMOG) enema  960 mL Rectal Once Eugenie Filler, MD      . tiZANidine (ZANAFLEX) tablet 8 mg  8 mg Oral TID Armandina Gemma, MD   8 mg at 07/01/20 1609     Discharge Medications: Please see discharge summary for a list of discharge medications.  Relevant Imaging Results:  Relevant Lab Results:   Additional Information SS# [id number]; pt has  had COVID vaccination but not booster  Ranald Alessio, LCSW

## 2020-07-01 NOTE — Progress Notes (Signed)
Nutrition Follow-up  DOCUMENTATION CODES:   Non-severe (moderate) malnutrition in context of chronic illness  INTERVENTION:   Boost Breeze po TID, each supplement provides 250 kcal and 9 grams of protein  Continue 52ml Prosource Plus po TID, each supplement provides 100 kcals and 15 grams of protein  Continue Magic cup BID with meals, each supplement provides 290 kcal and 9 grams of protein  Continue snacks BID  Continue MVI with minerals daily   NUTRITION DIAGNOSIS:   Moderate Malnutrition related to chronic illness,wound healing (MS) as evidenced by energy intake < or equal to 75% for > or equal to 1 month,moderate fat depletion,mild muscle depletion.  ongoing  GOAL:   Patient will meet greater than or equal to 90% of their needs  progressing  MONITOR:   PO intake,Supplement acceptance,Labs,Weight trends,I & O's  REASON FOR ASSESSMENT:   Malnutrition Screening Tool    ASSESSMENT:   60 y.o. African-American female presented to the [facility] Emergency Department via EMS with complaints of failure to thrive and worsening of gluteal/sacral pressure sores.She has a history of multiple sclerosis, which has her confined to a wheelchair for mobility.  1/27 s/p debridement of stage IV sacral decubitus ulcer with removal of necrotic skin, adipose tissue, fascia, and muscle 2/2 CXR concerning for PNA  MD notes pt's anticipated d/c date is tomorrow, 2/9.   Pt has had poor appetite since last RD assessment, though this appears to be improving today. Meal completions over last week have been documented as 0-70% x 15 recorded meals (29% average meal intake). Pt has been receiving the following: 43ml Prosource Plus TID, Magic Cup BID, and snacks BID. Per RN, pt doing very well with Prosource, but snacks/Magic Cup are variable. Note pt previously declined Ensure due to it causing diarrhea. Will trial boost breeze given it is not milky and pt needs additional  kcals/protein.   Admission weight:  81.8 kg Last recorded weight: 81.8 kg   UOP: 1531ml x24 hours  Labs: CBGs 95-113 Medications: ferrous sulfate, ss novolog TID with meals and daily at bedtime, mvi with minerals, plecanatide, senokot-s, smog enema  Diet Order:   Diet Order            DIET DYS 3 Room service appropriate? No; Fluid consistency: Thin  Diet effective now                 EDUCATION NEEDS:   Education needs have been addressed  Skin:  Skin Assessment: Skin Integrity Issues: Skin Integrity Issues:: Stage IV Stage IV: right buttocks  Last BM:  2/7  Height:   Ht Readings from Last 1 Encounters:  06/17/20 5\' 5"  (1.651 m)    Weight:   Wt Readings from Last 1 Encounters:  06/19/20 81.8 kg    BMI:  Body mass index is 30.02 kg/m.  Estimated Nutritional Needs:   Kcal:  2200-2400  Protein:  110-125g  Fluid:  2.2L/day    Larkin Ina, MS, RD, LDN RD pager number and weekend/on-call pager number located in [place].

## 2020-07-02 LAB — CBC
HCT: 28 % — ABNORMAL LOW (ref 36.0–46.0)
Hemoglobin: 9 g/dL — ABNORMAL LOW (ref 12.0–15.0)
MCH: 26.7 pg (ref 26.0–34.0)
MCHC: 32.1 g/dL (ref 30.0–36.0)
MCV: 83.1 fL (ref 80.0–100.0)
Platelets: 655 10*3/uL — ABNORMAL HIGH (ref 150–400)
RBC: 3.37 MIL/uL — ABNORMAL LOW (ref 3.87–5.11)
RDW: 21.1 % — ABNORMAL HIGH (ref 11.5–15.5)
WBC: 13.8 10*3/uL — ABNORMAL HIGH (ref 4.0–10.5)
nRBC: 0.1 % (ref 0.0–0.2)

## 2020-07-02 LAB — MAGNESIUM: Magnesium: 1.8 mg/dL (ref 1.7–2.4)

## 2020-07-02 LAB — GLUCOSE, CAPILLARY
Glucose-Capillary: 94 mg/dL (ref 70–99)
Glucose-Capillary: 109 mg/dL — ABNORMAL HIGH (ref 70–99)
Glucose-Capillary: 93 mg/dL (ref 70–99)
Glucose-Capillary: 99 mg/dL (ref 70–99)

## 2020-07-02 LAB — BASIC METABOLIC PANEL
Anion gap: 11 (ref 5–15)
BUN: 11 mg/dL (ref 6–20)
CO2: 22 mmol/L (ref 22–32)
Calcium: 9 mg/dL (ref 8.9–10.3)
Chloride: 108 mmol/L (ref 98–111)
Creatinine, Ser: 0.57 mg/dL (ref 0.44–1.00)
GFR, Estimated: >60 mL/min (ref >60)
Glucose, Bld: 101 mg/dL — ABNORMAL HIGH (ref 70–99)
Potassium: 3.5 mmol/L (ref 3.5–5.1)
Sodium: 141 mmol/L (ref 135–145)

## 2020-07-02 NOTE — Progress Notes (Signed)
PROGRESS NOTE    Tina Griffith  RXV:[id number] DOB: 1960/10/14 DOA: 06/16/2020 PCP: [name]     Brief Narrative:  Tina Griffith is a 60 y.o.femalewith PMH significant for multiple sclerosis, movement disorder, hypertension, constipation and COVID-19 pneumonia in December 2021. At baseline, she was using a walker and wheelchair up until [place] admission. Since then she has remained bedbound at home and has developed sacral decubitus ulcers. On 1/24, patient was brought to the ED with complaint of failure to thrive, worsening of gluteal/sacral pressure sores. Since last hospitalization 4 weeks ago for Covid pneumonia, patient has had anorexia, weight loss, generalized fatigue and worsening lower extremity edema.  Patient was admitted for sepsis secondary to infected sacral decubitus ulcer. On 1/27, patient underwent debridement of sacral decubitus ulcer by Dr. Harlow Asa.Infectious disease consultation was obtained. Patient was initially placed on broad-spectrum IV antibiotics then on 2 weeks course of oral antibiotics. General surgery and infectious disease signed off.  New events last 24 hours / Subjective: Patient very pleasant this morning.  She had a fever yesterday 100.4.  She has no physical complaints.  Denies any chest pain, abdominal pain, shortness of breath or nausea or vomiting.  Assessment & Plan:   Principal Problem:   Sepsis with acute organ dysfunction without septic shock (HCC) Active Problems:   Multiple sclerosis (HCC)   Chronic kidney disease   Decubital ulcer   Essential (primary) hypertension   Hypomagnesemia   Anterior optic neuritis   Hemiplegia, spastic (HCC)   Leucocytosis   UTI (urinary tract infection)   Wound infection   Malnutrition of moderate degree   Anemia of chronic disease   Fever   Hypoxia   Impaction of colon (HCC)   Sepsis secondary to infected stage IV sacral decubitus ulcer/blood cultures positive for Bacteroides  ovatus -Patient presented with stage IV sacral decubitus ulcer.  Seen in consultation by wound care nurse and general surgery.  Status post debridement of sacral decubitus ulcer per general surgery 06/19/2020. -1/2 blood cultures from 06/16/2020 + for bacteriodes ovatus.  Repeat blood cultures 06/20/2020 negative to date. -ID consulted and was following initially patient was on IV ertapenem and subsequently IV daptomycin.  Patient transitioned to oral Augmentin and doxycycline for total of 2 weeks through 07/01/2020 per initial ID recommendation.  -Due to fluctuating leukocytosis and fever, patient was placed on IV Zosyn x5 days per ID.  Last day 2/9 -She will need outpatient follow-up with general surgery and ID on discharge.   E. coli UTI -Completed antibiotics  Multiple sclerosis -Stable.  Continue home regimen  Pain management -Continue home regimen  Hypertension -Continue Toprol-XL  Well-controlled diabetes mellitus type 2 -Hemoglobin A1c 6.5 (06/18/2020) -Continue sliding scale insulin  Elevated troponin -Patient noted to have elevated troponin up to 112 which trended down.  Patient asymptomatic.  2D echo with EF of 60 to 65%, mild LVH.  Continue aspirin and statin.  Outpatient follow-up.  History of IBS -Continue Trulance, PPI.      In agreement with assessment of the pressure ulcer as below:  Pressure Injury 06/17/20 Buttocks Right;Anterior Stage 4 - Full thickness tissue loss with exposed bone, tendon or muscle. Deep Oval shaped pressure wound (6cmx4cmx3cm) muscle and bone exposed, areas of necrotic tissue at the edges, red & yellow inside w (Active)  06/17/20 2200  Location: Buttocks  Location Orientation: Right;Anterior  Staging: Stage 4 - Full thickness tissue loss with exposed bone, tendon or muscle.  Wound Description (Comments): Deep Oval shaped pressure wound (  6cmx4cmx3cm) muscle and bone exposed, areas of necrotic tissue at the edges, red & yellow inside  w/active bleeding & tunneling, foul smelling  Present on Admission: Yes     Nutrition Problem: Moderate Malnutrition Etiology: chronic illness,wound healing (MS)   DVT prophylaxis:  enoxaparin (LOVENOX) injection 40 mg Start: 06/20/20 0800 SCD's Start: 06/19/20 1520  Code Status: Full code Family Communication: No family at bedside Disposition Plan:  Status is: Inpatient  Remains inpatient appropriate because:Unsafe d/c plan   Dispo: The patient is from: Home              Anticipated d/c is to: SNF              Anticipated d/c date is: 1 day              Patient currently is medically stable to d/c.   Difficult to place patient No   Antimicrobials:  Anti-infectives (From admission, onward)   Start     Dose/Rate Route Frequency Ordered Stop   07/01/20 1600  piperacillin-tazobactam (ZOSYN) IVPB 3.375 g        3.375 g 12.5 mL/hr over 240 Minutes Intravenous Every 8 hours 07/01/20 1542 07/02/20 0931   06/26/20 1800  piperacillin-tazobactam (ZOSYN) IVPB 3.375 g        3.375 g 12.5 mL/hr over 240 Minutes Intravenous Every 8 hours 06/26/20 1643 06/30/20 2237   06/22/20 2200  amoxicillin-clavulanate (AUGMENTIN) 875-125 MG per tablet 1 tablet  Status:  Discontinued        1 tablet Oral Every 12 hours 06/22/20 1548 06/26/20 1639   06/22/20 2200  doxycycline (VIBRA-TABS) tablet 100 mg  Status:  Discontinued        100 mg Oral Every 12 hours 06/22/20 1548 06/26/20 1639   06/20/20 2000  DAPTOmycin (CUBICIN) 650 mg in sodium chloride 0.9 % IVPB  Status:  Discontinued        650 mg 226 mL/hr over 30 Minutes Intravenous Daily 06/20/20 1320 06/22/20 1548   06/20/20 1800  ertapenem (INVANZ) 1,000 mg in sodium chloride 0.9 % 100 mL IVPB  Status:  Discontinued        1 g 200 mL/hr over 30 Minutes Intravenous Every 24 hours 06/20/20 1402 06/22/20 1548   06/19/20 2000  doxycycline (VIBRA-TABS) tablet 100 mg  Status:  Discontinued        100 mg Oral 2 times per day 06/19/20 1324 06/20/20  1320   06/17/20 1200  vancomycin (VANCOREADY) IVPB 750 mg/150 mL  Status:  Discontinued        750 mg 150 mL/hr over 60 Minutes Intravenous Every 12 hours 06/16/20 2115 06/19/20 1322   06/17/20 0015  piperacillin-tazobactam (ZOSYN) IVPB 3.375 g  Status:  Discontinued        3.375 g 12.5 mL/hr over 240 Minutes Intravenous Every 8 hours 06/17/20 0004 06/20/20 1402   06/17/20 0000  piperacillin-tazobactam (ZOSYN) IVPB 3.375 g  Status:  Discontinued        3.375 g 100 mL/hr over 30 Minutes Intravenous Every 6 hours 06/16/20 2329 06/17/20 0003   06/16/20 2200  cefTRIAXone (ROCEPHIN) 2 g in sodium chloride 0.9 % 100 mL IVPB  Status:  Discontinued        2 g 200 mL/hr over 30 Minutes Intravenous Every 24 hours 06/16/20 2101 06/16/20 2329   06/16/20 2115  vancomycin (VANCOREADY) IVPB 1500 mg/300 mL        1,500 mg 150 mL/hr over 120 Minutes Intravenous  Once 06/16/20 2106  06/17/20 0235   06/16/20 1545  piperacillin-tazobactam (ZOSYN) IVPB 3.375 g        3.375 g 100 mL/hr over 30 Minutes Intravenous  Once 06/16/20 1533 06/16/20 1633        Objective: Vitals:   07/01/20 2158 07/02/20 0552 07/02/20 0747 07/02/20 0749  BP: (!) 110/57 123/74    Pulse: (!) 109 98    Resp: 18 16    Temp: 99.5 F (37.5 C) 98.2 F (36.8 C)    TempSrc: Oral Oral    SpO2: 96% 95% 94% 94%  Weight:      Height:        Intake/Output Summary (Last 24 hours) at 07/02/2020 1039 Last data filed at 07/02/2020 1000 Gross per 24 hour  Intake 340 ml  Output 450 ml  Net -110 ml   Filed Weights   06/17/20 1145 06/18/20 0500 06/19/20 0844  Weight: 74.8 kg 81.8 kg 81.8 kg    Examination:  General exam: Appears calm and comfortable  Respiratory system: Clear to auscultation. Respiratory effort normal. No respiratory distress. No conversational dyspnea.  On room air Cardiovascular system: S1 & S2 heard, RRR. No murmurs. No pedal edema. Gastrointestinal system: Abdomen is nondistended, soft and nontender. Normal bowel  sounds heard. Central nervous system: Alert and oriented. Speech clear.  Extremities: Symmetric in appearance  Psychiatry: Judgement and insight appear normal. Mood & affect appropriate.   Data Reviewed: I have personally reviewed following labs and imaging studies  CBC: Recent Labs  Lab 06/26/20 0450 06/27/20 0505 06/28/20 0536 06/29/20 0758 07/01/20 0450 07/02/20 0513  WBC 13.0* 14.3* 12.1* 15.5* 16.1* 13.8*  NEUTROABS 9.3* 10.6* 8.3* 11.7* 10.9*  --   HGB 8.9* 8.5* 8.4* 9.6* 8.8* 9.0*  HCT 28.1* 26.5* 26.1* 29.4* 27.9* 28.0*  MCV 83.9 81.5 81.6 79.9* 83.5 83.1  PLT 565* 643* 683* 697* 722* 644*   Basic Metabolic Panel: Recent Labs  Lab 06/26/20 0450 06/27/20 0505 06/28/20 0536 06/29/20 0758 06/30/20 0431 07/01/20 0450 07/02/20 0513  NA 136 136 135 138 139 140 141  K 3.8 4.0 3.9 4.1 3.5 3.6 3.5  CL 104 99 102 103 104 106 108  CO2 23 23 25 23 23 24 22   GLUCOSE 111* 113* 95 110* 108* 105* 101*  BUN 11 10 11 11 15 10 11   CREATININE 0.66 0.55 0.55 0.58 0.53 0.64 0.57  CALCIUM 8.7* 8.8* 8.6* 8.9 8.7* 9.1 9.0  MG 1.7 1.7 1.9 1.6* 2.2 1.8 1.8  PHOS 3.0 2.8  --   --   --  3.1  --    GFR: Estimated Creatinine Clearance: 80 mL/min (by C-G formula based on SCr of 0.57 mg/dL). Liver Function Tests: Recent Labs  Lab 06/26/20 0450 06/27/20 0505 07/01/20 0450  ALBUMIN 2.0* 2.1* 2.1*   No results for input(s): LIPASE, AMYLASE in the last 168 hours. No results for input(s): AMMONIA in the last 168 hours. Coagulation Profile: No results for input(s): INR, PROTIME in the last 168 hours. Cardiac Enzymes: No results for input(s): CKTOTAL, CKMB, CKMBINDEX, TROPONINI in the last 168 hours. BNP (last 3 results) No results for input(s): PROBNP in the last 8760 hours. HbA1C: No results for input(s): HGBA1C in the last 72 hours. CBG: Recent Labs  Lab 07/01/20 0735 07/01/20 1131 07/01/20 1701 07/01/20 2200 07/02/20 0803  GLUCAP 95 113* 115* 120* 94   Lipid Profile: No  results for input(s): CHOL, HDL, LDLCALC, TRIG, CHOLHDL, LDLDIRECT in the last 72 hours. Thyroid Function Tests: No results  for input(s): TSH, T4TOTAL, FREET4, T3FREE, THYROIDAB in the last 72 hours. Anemia Panel: No results for input(s): VITAMINB12, FOLATE, FERRITIN, TIBC, IRON, RETICCTPCT in the last 72 hours. Sepsis Labs: No results for input(s): PROCALCITON, LATICACIDVEN in the last 168 hours.  Recent Results (from the past 240 hour(s))  Culture, Urine     Status: Abnormal   Collection Time: 06/26/20  2:37 PM   Specimen: Urine, Random  Result Value Ref Range Status   Specimen Description   Final    URINE, RANDOM Performed at [facility] [address] [place], [place] [address]    Special Requests   Final    NONE Performed at [facility], [place] [address] [place], [place] [address]    Culture (A)  Final    <10,000 COLONIES/mL INSIGNIFICANT GROWTH Performed at [facility] [address] [place], [place] [address]    Report Status 06/28/2020 FINAL  Final  Culture, blood (Routine X 2) w Reflex to ID Panel     Status: None   Collection Time: 06/26/20  5:22 PM   Specimen: BLOOD RIGHT HAND  Result Value Ref Range Status   Specimen Description   Final    BLOOD RIGHT HAND Performed at [facility] [address] [place], [place] [address]    Special Requests   Final    BOTTLES DRAWN AEROBIC ONLY Blood Culture results may not be optimal due to an inadequate volume of blood received in culture bottles Performed at [facility] [address] [place], [place] [address]    Culture   Final    NO GROWTH 5 DAYS Performed at [facility], [place] [address], [place], [place] [address]    Report Status 07/01/2020 FINAL  Final  Culture, blood (Routine X 2) w Reflex to ID Panel     Status: None   Collection Time: 06/26/20  5:22 PM   Specimen: BLOOD RIGHT HAND  Result Value Ref Range Status    Specimen Description   Final    BLOOD RIGHT HAND Performed at [facility] [address] [place], [place] [address]    Special Requests   Final    BOTTLES DRAWN AEROBIC ONLY Blood Culture results may not be optimal due to an inadequate volume of blood received in culture bottles Performed at [facility] [address] [place], [place] [address]    Culture   Final    NO GROWTH 5 DAYS Performed at [facility], [place] [address], [place],  [address]    Report Status 07/01/2020 FINAL  Final      Radiology Studies: No results found.    Scheduled Meds: . (feeding supplement) PROSource Plus  30 mL Oral TID WC  . aspirin EC  81 mg Oral Daily  . benzonatate  100 mg Oral TID  . Chlorhexidine Gluconate Cloth  6 each Topical Daily  . dalfampridine  10 mg Oral BID  . enoxaparin (LOVENOX) injection  40 mg Subcutaneous Q24H  . ferrous sulfate  325 mg Oral BID  . fluticasone furoate-vilanterol  1 puff Inhalation Daily  . insulin aspart  0-5 Units Subcutaneous QHS  . insulin aspart  0-9 Units Subcutaneous TID WC  . metoprolol succinate  100 mg Oral Daily  . multivitamin with minerals  1 tablet Oral Daily  . oxybutynin  10 mg Oral Daily  . pantoprazole  40 mg Oral Daily  .  Plecanatide  1 tablet Oral Daily  . pravastatin  20 mg Oral q1800  . senna-docusate  1 tablet Oral BID  . sorbitol, milk of mag, mineral oil, glycerin (SMOG) enema  960 mL Rectal Once  . tiZANidine  8 mg Oral TID   Continuous Infusions:   LOS: 16 days      Time spent: 25 minutes   [name], DO [facility] 07/02/2020, 10:39 AM   Available via Epic secure chat 7am-7pm After these hours, please refer to coverage provider listed on [email or web address]

## 2020-07-02 NOTE — Progress Notes (Signed)
Chaplain follow up to complete advance directives.   Advance directive had been notarized earlier in shift.  Chaplain placed copy in chart and scanned copy to ACP Documents for placement in [place].   Pt has original and two copies.

## 2020-07-03 LAB — CBC
HCT: 29.5 % — ABNORMAL LOW (ref 36.0–46.0)
Hemoglobin: 9.4 g/dL — ABNORMAL LOW (ref 12.0–15.0)
MCH: 26.8 pg (ref 26.0–34.0)
MCHC: 31.9 g/dL (ref 30.0–36.0)
MCV: 84 fL (ref 80.0–100.0)
Platelets: 796 10*3/uL — ABNORMAL HIGH (ref 150–400)
RBC: 3.51 MIL/uL — ABNORMAL LOW (ref 3.87–5.11)
RDW: 21.5 % — ABNORMAL HIGH (ref 11.5–15.5)
WBC: 14.3 10*3/uL — ABNORMAL HIGH (ref 4.0–10.5)
nRBC: 0 % (ref 0.0–0.2)

## 2020-07-03 LAB — GLUCOSE, CAPILLARY
Glucose-Capillary: 93 mg/dL (ref 70–99)
Glucose-Capillary: 91 mg/dL (ref 70–99)
Glucose-Capillary: 118 mg/dL — ABNORMAL HIGH (ref 70–99)
Glucose-Capillary: 94 mg/dL (ref 70–99)
Glucose-Capillary: 136 mg/dL — ABNORMAL HIGH (ref 70–99)

## 2020-07-03 MED ORDER — OXYCODONE-ACETAMINOPHEN 10-325 MG PO TABS: 1.0000 | ORAL_TABLET | Freq: Four times a day (QID) | ORAL | 0 refills | Status: AC

## 2020-07-03 MED ORDER — LIP MEDEX EX OINT
TOPICAL_OINTMENT | CUTANEOUS | Status: AC
  Filled 2020-07-03: qty 7

## 2020-07-03 NOTE — Progress Notes (Signed)
PROGRESS NOTE    Tina Griffith  RUE:[id number] DOB: 07-21-1960 DOA: 06/16/2020 PCP: [name]     Brief Narrative:  Tina Griffith is a 60 y.o.femalewith PMH significant for multiple sclerosis, movement disorder, hypertension, constipation and COVID-19 pneumonia in December 2021. At baseline, she was using a walker and wheelchair up until [place] admission. Since then she has remained bedbound at home and has developed sacral decubitus ulcers. On 1/24, patient was brought to the ED with complaint of failure to thrive, worsening of gluteal/sacral pressure sores. Since last hospitalization 4 weeks ago for Covid pneumonia, patient has had anorexia, weight loss, generalized fatigue and worsening lower extremity edema.  Patient was admitted for sepsis secondary to infected sacral decubitus ulcer. On 1/27, patient underwent debridement of sacral decubitus ulcer by Dr. Harlow Asa.Infectious disease consultation was obtained. Patient was initially placed on broad-spectrum IV antibiotics then on 2 weeks course of oral antibiotics. General surgery and infectious disease signed off.  New events last 24 hours / Subjective: Remains afebrile.  No complaints today.  Assessment & Plan:   Principal Problem:   Sepsis with acute organ dysfunction without septic shock (HCC) Active Problems:   Multiple sclerosis (HCC)   Chronic kidney disease   Decubital ulcer   Essential (primary) hypertension   Hypomagnesemia   Anterior optic neuritis   Hemiplegia, spastic (HCC)   Leucocytosis   UTI (urinary tract infection)   Wound infection   Malnutrition of moderate degree   Anemia of chronic disease   Fever   Hypoxia   Impaction of colon (HCC)   Sepsis secondary to infected stage IV sacral decubitus ulcer/blood cultures positive for Bacteroides ovatus -Patient presented with stage IV sacral decubitus ulcer.  Seen in consultation by wound care nurse and general surgery.  Status post  debridement of sacral decubitus ulcer per general surgery 06/19/2020. -1/2 blood cultures from 06/16/2020 + for bacteriodes ovatus.  Repeat blood cultures 06/20/2020 negative to date. -ID consulted and was following initially patient was on IV ertapenem and subsequently IV daptomycin.  Patient transitioned to oral Augmentin and doxycycline for total of 2 weeks through 07/01/2020 per initial ID recommendation.  -Due to fluctuating leukocytosis and fever, patient was placed on IV Zosyn x5 days per ID.  Last day 2/9 -She will need outpatient follow-up with general surgery and ID on discharge.   E. coli UTI -Completed antibiotics  Multiple sclerosis -Stable.  Continue home regimen  Pain management -Continue home regimen  Hypertension -Continue Toprol-XL  Well-controlled diabetes mellitus type 2 -Hemoglobin A1c 6.5 (06/18/2020) -Continue sliding scale insulin  Elevated troponin -Patient noted to have elevated troponin up to 112 which trended down.  Patient asymptomatic.  2D echo with EF of 60 to 65%, mild LVH.  Continue aspirin and statin.  Outpatient follow-up.  History of IBS -Continue Trulance, PPI.      In agreement with assessment of the pressure ulcer as below:  Pressure Injury 06/17/20 Buttocks Right;Anterior Stage 4 - Full thickness tissue loss with exposed bone, tendon or muscle. Deep Oval shaped pressure wound (6cmx4cmx3cm) muscle and bone exposed, areas of necrotic tissue at the edges, red & yellow inside w (Active)  06/17/20 2200  Location: Buttocks  Location Orientation: Right;Anterior  Staging: Stage 4 - Full thickness tissue loss with exposed bone, tendon or muscle.  Wound Description (Comments): Deep Oval shaped pressure wound (6cmx4cmx3cm) muscle and bone exposed, areas of necrotic tissue at the edges, red & yellow inside w/active bleeding & tunneling, foul smelling  Present on Admission:  Yes     Nutrition Problem: Moderate Malnutrition Etiology: chronic  illness,wound healing (MS)   DVT prophylaxis:  enoxaparin (LOVENOX) injection 40 mg Start: 06/20/20 0800 SCD's Start: 06/19/20 1520  Code Status: Full code Family Communication: No family at bedside, updated daughter over the phone today  Disposition Plan:  Status is: Inpatient  Remains inpatient appropriate because:Unsafe d/c plan   Dispo: The patient is from: Home              Anticipated d/c is to: SNF              Anticipated d/c date is: 1 day              Patient currently is medically stable to d/c.  Awaiting SNF placement   Difficult to place patient No   Antimicrobials:  Anti-infectives (From admission, onward)   Start     Dose/Rate Route Frequency Ordered Stop   07/01/20 1600  piperacillin-tazobactam (ZOSYN) IVPB 3.375 g        3.375 g 12.5 mL/hr over 240 Minutes Intravenous Every 8 hours 07/01/20 1542 07/02/20 0931   06/26/20 1800  piperacillin-tazobactam (ZOSYN) IVPB 3.375 g        3.375 g 12.5 mL/hr over 240 Minutes Intravenous Every 8 hours 06/26/20 1643 06/30/20 2237   06/22/20 2200  amoxicillin-clavulanate (AUGMENTIN) 875-125 MG per tablet 1 tablet  Status:  Discontinued        1 tablet Oral Every 12 hours 06/22/20 1548 06/26/20 1639   06/22/20 2200  doxycycline (VIBRA-TABS) tablet 100 mg  Status:  Discontinued        100 mg Oral Every 12 hours 06/22/20 1548 06/26/20 1639   06/20/20 2000  DAPTOmycin (CUBICIN) 650 mg in sodium chloride 0.9 % IVPB  Status:  Discontinued        650 mg 226 mL/hr over 30 Minutes Intravenous Daily 06/20/20 1320 06/22/20 1548   06/20/20 1800  ertapenem (INVANZ) 1,000 mg in sodium chloride 0.9 % 100 mL IVPB  Status:  Discontinued        1 g 200 mL/hr over 30 Minutes Intravenous Every 24 hours 06/20/20 1402 06/22/20 1548   06/19/20 2000  doxycycline (VIBRA-TABS) tablet 100 mg  Status:  Discontinued        100 mg Oral 2 times per day 06/19/20 1324 06/20/20 1320   06/17/20 1200  vancomycin (VANCOREADY) IVPB 750 mg/150 mL  Status:   Discontinued        750 mg 150 mL/hr over 60 Minutes Intravenous Every 12 hours 06/16/20 2115 06/19/20 1322   06/17/20 0015  piperacillin-tazobactam (ZOSYN) IVPB 3.375 g  Status:  Discontinued        3.375 g 12.5 mL/hr over 240 Minutes Intravenous Every 8 hours 06/17/20 0004 06/20/20 1402   06/17/20 0000  piperacillin-tazobactam (ZOSYN) IVPB 3.375 g  Status:  Discontinued        3.375 g 100 mL/hr over 30 Minutes Intravenous Every 6 hours 06/16/20 2329 06/17/20 0003   06/16/20 2200  cefTRIAXone (ROCEPHIN) 2 g in sodium chloride 0.9 % 100 mL IVPB  Status:  Discontinued        2 g 200 mL/hr over 30 Minutes Intravenous Every 24 hours 06/16/20 2101 06/16/20 2329   06/16/20 2115  vancomycin (VANCOREADY) IVPB 1500 mg/300 mL        1,500 mg 150 mL/hr over 120 Minutes Intravenous  Once 06/16/20 2106 06/17/20 0235   06/16/20 1545  piperacillin-tazobactam (ZOSYN) IVPB 3.375 g  3.375 g 100 mL/hr over 30 Minutes Intravenous  Once 06/16/20 1533 06/16/20 1633       Objective: Vitals:   07/02/20 2008 07/03/20 0525 07/03/20 0752 07/03/20 0754  BP: 109/64 (!) 114/37    Pulse: 96 (!) 105    Resp: 16 16    Temp: 99 F (37.2 C)     TempSrc: Oral     SpO2: 92% 93% 95% 95%  Weight:      Height:        Intake/Output Summary (Last 24 hours) at 07/03/2020 1216 Last data filed at 07/03/2020 8921 Gross per 24 hour  Intake 420 ml  Output 751 ml  Net -331 ml   Filed Weights   06/17/20 1145 06/18/20 0500 06/19/20 0844  Weight: 74.8 kg 81.8 kg 81.8 kg    Examination: General exam: Appears calm and comfortable  Respiratory system: Clear to auscultation. Respiratory effort normal. Cardiovascular system: S1 & S2 heard, RRR. No pedal edema. Gastrointestinal system: Abdomen is nondistended, soft and nontender. Normal bowel sounds heard. Central nervous system: Alert and oriented. Non focal exam. Speech clear  Extremities: Symmetric in appearance bilaterally  Psychiatry: Judgement and insight  appear stable. Mood & affect appropriate.    Data Reviewed: I have personally reviewed following labs and imaging studies  CBC: Recent Labs  Lab 06/27/20 0505 06/28/20 0536 06/29/20 0758 07/01/20 0450 07/02/20 0513 07/03/20 0516  WBC 14.3* 12.1* 15.5* 16.1* 13.8* 14.3*  NEUTROABS 10.6* 8.3* 11.7* 10.9*  --   --   HGB 8.5* 8.4* 9.6* 8.8* 9.0* 9.4*  HCT 26.5* 26.1* 29.4* 27.9* 28.0* 29.5*  MCV 81.5 81.6 79.9* 83.5 83.1 84.0  PLT 643* 683* 697* 722* 655* 194*   Basic Metabolic Panel: Recent Labs  Lab 06/27/20 0505 06/28/20 0536 06/29/20 0758 06/30/20 0431 07/01/20 0450 07/02/20 0513  NA 136 135 138 139 140 141  K 4.0 3.9 4.1 3.5 3.6 3.5  CL 99 102 103 104 106 108  CO2 23 25 23 23 24 22   GLUCOSE 113* 95 110* 108* 105* 101*  BUN 10 11 11 15 10 11   CREATININE 0.55 0.55 0.58 0.53 0.64 0.57  CALCIUM 8.8* 8.6* 8.9 8.7* 9.1 9.0  MG 1.7 1.9 1.6* 2.2 1.8 1.8  PHOS 2.8  --   --   --  3.1  --    GFR: Estimated Creatinine Clearance: 80 mL/min (by C-G formula based on SCr of 0.57 mg/dL). Liver Function Tests: Recent Labs  Lab 06/27/20 0505 07/01/20 0450  ALBUMIN 2.1* 2.1*   No results for input(s): LIPASE, AMYLASE in the last 168 hours. No results for input(s): AMMONIA in the last 168 hours. Coagulation Profile: No results for input(s): INR, PROTIME in the last 168 hours. Cardiac Enzymes: No results for input(s): CKTOTAL, CKMB, CKMBINDEX, TROPONINI in the last 168 hours. BNP (last 3 results) No results for input(s): PROBNP in the last 8760 hours. HbA1C: No results for input(s): HGBA1C in the last 72 hours. CBG: Recent Labs  Lab 07/02/20 1553 07/02/20 2005 07/03/20 0011 07/03/20 0803 07/03/20 1133  GLUCAP 93 99 93 91 118*   Lipid Profile: No results for input(s): CHOL, HDL, LDLCALC, TRIG, CHOLHDL, LDLDIRECT in the last 72 hours. Thyroid Function Tests: No results for input(s): TSH, T4TOTAL, FREET4, T3FREE, THYROIDAB in the last 72 hours. Anemia Panel: No  results for input(s): VITAMINB12, FOLATE, FERRITIN, TIBC, IRON, RETICCTPCT in the last 72 hours. Sepsis Labs: No results for input(s): PROCALCITON, LATICACIDVEN in the last 168 hours.  Recent  Results (from the past 240 hour(s))  Culture, Urine     Status: Abnormal   Collection Time: 06/26/20  2:37 PM   Specimen: Urine, Random  Result Value Ref Range Status   Specimen Description   Final    URINE, RANDOM Performed at [facility] [address] [place], [place] [address]    Special Requests   Final    NONE Performed at [facility], [place] [address] [place], [place] [address]    Culture (A)  Final    <10,000 COLONIES/mL INSIGNIFICANT GROWTH Performed at [facility] [address] [place], [place] [address]    Report Status 06/28/2020 FINAL  Final  Culture, blood (Routine X 2) w Reflex to ID Panel     Status: None   Collection Time: 06/26/20  5:22 PM   Specimen: BLOOD RIGHT HAND  Result Value Ref Range Status   Specimen Description   Final    BLOOD RIGHT HAND Performed at [facility] [address], [place], [place] [address]    Special Requests   Final    BOTTLES DRAWN AEROBIC ONLY Blood Culture results may not be optimal due to an inadequate volume of blood received in culture bottles Performed at [facility] [address] [place], [place] [address]    Culture   Final    NO GROWTH 5 DAYS Performed at [facility], [place] [address], [place], [place] [address]    Report Status 07/01/2020 FINAL  Final  Culture, blood (Routine X 2) w Reflex to ID Panel     Status: None   Collection Time: 06/26/20  5:22 PM   Specimen: BLOOD RIGHT HAND  Result Value Ref Range Status   Specimen Description   Final    BLOOD RIGHT HAND Performed at [facility] [address] [place], [place] [address]    Special Requests   Final    BOTTLES DRAWN AEROBIC ONLY Blood Culture  results may not be optimal due to an inadequate volume of blood received in culture bottles Performed at [facility] [address] [place], [place] [address]    Culture   Final    NO GROWTH 5 DAYS Performed at [facility], [place] [address] [place], [place] [address]    Report Status 07/01/2020 FINAL  Final      Radiology Studies: No results found.    Scheduled Meds: . (feeding supplement) PROSource Plus  30 mL Oral TID WC  . aspirin EC  81 mg Oral Daily  . benzonatate  100 mg Oral TID  . Chlorhexidine Gluconate Cloth  6 each Topical Daily  . dalfampridine  10 mg Oral BID  . enoxaparin (LOVENOX) injection  40 mg Subcutaneous Q24H  . ferrous sulfate  325 mg Oral BID  . fluticasone furoate-vilanterol  1 puff Inhalation Daily  . insulin aspart  0-5 Units Subcutaneous QHS  . insulin aspart  0-9 Units Subcutaneous TID WC  . metoprolol succinate  100 mg Oral Daily  . multivitamin with minerals  1 tablet Oral Daily  . oxybutynin  10 mg Oral Daily  . pantoprazole  40 mg Oral Daily  . Plecanatide  1 tablet Oral Daily  . pravastatin  20 mg Oral q1800  . senna-docusate  1 tablet Oral BID  . sorbitol, milk of mag, mineral oil, glycerin (SMOG) enema  960 mL Rectal Once  . tiZANidine  8 mg  Oral TID   Continuous Infusions:   LOS: 17 days      Time spent: 20 minutes   [name], DO [facility] 07/03/2020, 12:16 PM   Available via Epic secure chat 7am-7pm After these hours, please refer to coverage provider listed on [email or web address]

## 2020-07-03 NOTE — Discharge Summary (Addendum)
Physician Discharge Summary  [name] CWC:[id number] DOB: 07-27-1960 DOA: 06/16/2020  PCP: [name]  Admit date: 06/16/2020 Discharge date: 07/07/2020  Admitted From: Home Disposition:  SNF   Recommendations for Outpatient Follow-up:  1. Follow up with PCP in 1 week 2. Follow up with [facility] wound clinic 3. Wound care as below: Moist-to-dry dressing changes to sacral wound TWICE DAILY using kerlix, normal saline, ABDs.   Discharge Condition: Stable CODE STATUS: Full  Diet recommendation:  Diet Orders (From admission, onward)    Start     Ordered   06/29/20 0827  DIET DYS 3 Room service appropriate? No; Fluid consistency: Thin  Diet effective now       Comments: Chopped meats  Question Answer Comment  Room service appropriate? No   Fluid consistency: Thin      06/29/20 0828         Brief/Interim Summary: Tina Griffith is a 60 y.o.femalewith PMH significant for multiple sclerosis, movement disorder, hypertension, constipation and COVID-19 pneumonia in December 2021. At baseline, she was using a walker and wheelchair up until [place] admission. Since then she has remained bedbound at home and has developed sacral decubitus ulcers. On 1/24, patient was brought to the ED with complaint of failure to thrive, worsening of gluteal/sacral pressure sores. Since last hospitalization 4 weeks ago for Covid pneumonia, patient has had anorexia, weight loss, generalized fatigue and worsening lower extremity edema.  Patient was admitted for sepsis secondary to infected sacral decubitus ulcer. On 1/27, patient underwent debridement of sacral decubitus ulcer by Dr. Harlow Asa.Infectious disease consultation was obtained. Patient was initially placed on broad-spectrum IV antibiotics then on 2 weeks course of oral antibiotics. General surgery and infectious disease signed off. She has remained stable after completing antibiotic course. SNF recommended by PT.    Discharge Diagnoses:  Principal Problem:   Sepsis with acute organ dysfunction without septic shock ([place]) Active Problems:   Multiple sclerosis (HCC)   Chronic kidney disease   Decubital ulcer   Essential (primary) hypertension   Hypomagnesemia   Anterior optic neuritis   Hemiplegia, spastic (HCC)   Leucocytosis   UTI (urinary tract infection)   Wound infection   Malnutrition of moderate degree   Anemia of chronic disease   Fever   Hypoxia   Impaction of colon (HCC)   Sepsis secondary to infected stage IV sacral decubitus ulcer/blood cultures positive for Bacteroides ovatus -Patient presented with stage IV sacral decubitus ulcer. Seen in consultation by wound care nurse and general surgery. Status post debridement of sacral decubitus ulcer per general surgery 06/19/2020. -1/2 blood cultures from 06/16/2020 + for bacteriodes ovatus. Repeat blood cultures 06/20/2020 negative to date. -ID consulted and was following initially patient was on IV ertapenem and subsequently IV daptomycin. Patient transitioned to oral Augmentin and doxycycline for total of 2 weeks through 07/01/2020 per initial ID recommendation.  -Due to fluctuating leukocytosis and fever, patient was placed on IV Zosyn x5 days per ID.  Last day 2/9 -Stable now completed antibiotic therapy  -Follow up with wound clinic   E. coli UTI -Completed antibiotics  Multiple sclerosis -Stable. Continue home regimen  Pain management -Continue home regimen  Hypertension -Continue Toprol-XL  Well-controlled diabetes mellitus type 2 -Hemoglobin A1c 6.5 (06/18/2020) -Stable   Elevated troponin -Patient noted to have elevated troponin up to 112 which trended down. Patient asymptomatic. 2D echo with EF of 60 to 65%, mild LVH. Continue aspirin and statin.   History of IBS -Continue Trulance,  PPI.    In agreement with assessment of the pressure ulcer as below:  Pressure Injury 06/17/20 Buttocks  Right;Anterior Stage 4 - Full thickness tissue loss with exposed bone, tendon or muscle. Deep Oval shaped pressure wound (6cmx4cmx3cm) muscle and bone exposed, areas of necrotic tissue at the edges, red & yellow inside w (Active)  06/17/20 2200  Location: Buttocks  Location Orientation: Right;Anterior  Staging: Stage 4 - Full thickness tissue loss with exposed bone, tendon or muscle.  Wound Description (Comments): Deep Oval shaped pressure wound (6cmx4cmx3cm) muscle and bone exposed, areas of necrotic tissue at the edges, red & yellow inside w/active bleeding & tunneling, foul smelling  Present on Admission: Yes    Nutrition Problem: Moderate Malnutrition Etiology: chronic illness,wound healing ([place])  Discharge Instructions  Discharge Instructions    Discharge wound care:   Complete by: As directed    Moist-to-dry dressing changes to sacral wound TWICE DAILY using kerlix, normal saline, ABDs   Discharge wound care:   Complete by: As directed    Moist-to-dry dressing changes to sacral wound TWICE DAILY using kerlix, normal saline, ABDs.   Increase activity slowly   Complete by: As directed      Allergies as of 07/07/2020      Reactions   No Known Allergies       Medication List    STOP taking these medications   baclofen 10 MG tablet Commonly known as: LIORESAL   citalopram 20 MG tablet Commonly known as: CELEXA   cyclobenzaprine 5 MG tablet Commonly known as: FLEXERIL   DULoxetine 60 MG capsule Commonly known as: Cymbalta   gabapentin 300 MG capsule Commonly known as: NEURONTIN   hydrochlorothiazide 25 MG tablet Commonly known as: HYDRODIURIL   HYDROcodone-acetaminophen 10-325 MG tablet Commonly known as: NORCO   methylPREDNISolone 4 MG Tbpk tablet Commonly known as: MEDROL DOSEPAK   morphine 30 MG tablet Commonly known as: MSIR   polyethylene glycol 17 g packet Commonly known as: MIRALAX / GLYCOLAX   sulfamethoxazole-trimethoprim 800-160 MG  tablet Commonly known as: BACTRIM DS   traMADol 50 MG tablet Commonly known as: ULTRAM     TAKE these medications   Airborne Elderberry Chew Chew 2 tablets by mouth daily.   Aspirin Low Dose 81 MG EC tablet Generic drug: aspirin Take 81 mg by mouth daily.   benzonatate 100 MG capsule Commonly known as: TESSALON Take 100 mg by mouth 3 (three) times daily.   Calmoseptine 0.44-20.6 % Oint Generic drug: Menthol-Zinc Oxide Apply 1 application topically 3 (three) times daily as needed (skin irritation).   dalfampridine 10 MG Tb12 Take 1 tablet (10 mg total) by mouth 2 (two) times daily.   FeroSul 325 (65 FE) MG tablet Generic drug: ferrous sulfate Take 325 mg by mouth 2 (two) times daily.   Livalo 2 MG Tabs Generic drug: Pitavastatin Calcium Take 1 tablet by mouth daily.   metoprolol succinate 100 MG 24 hr tablet Commonly known as: TOPROL-XL Take 100 mg by mouth daily.   Multivitamin Adult Tabs Take 1 tablet by mouth daily.   Naloxone HCl 0.4 MG/0.4ML Soaj Use as directed if unable to arouse the patient   ocrelizumab 300 MG/10ML injection Commonly known as: OCREVUS Inject 600 mg into the vein every 6 (six) months.   omeprazole 20 MG capsule Commonly known as: PRILOSEC Take 20 mg by mouth daily. What changed: Another medication with the same name was removed. Continue taking this medication, and follow the directions you see here.  oxybutynin 10 MG 24 hr tablet Commonly known as: DITROPAN-XL Take 10 mg by mouth daily. What changed: Another medication with the same name was removed. Continue taking this medication, and follow the directions you see here.   oxyCODONE-acetaminophen 10-325 MG tablet Commonly known as: PERCOCET Take 1 tablet by mouth 4 (four) times daily.   potassium chloride 10 MEQ CR capsule Commonly known as: MICRO-K Take 10 mEq by mouth 2 (two) times daily.   Symbicort 160-4.5 MCG/ACT inhaler Generic drug: budesonide-formoterol Inhale 2  puffs into the lungs daily.   tiZANidine 4 MG tablet Commonly known as: ZANAFLEX TAKE TWO (2) TABLETS THREE (3) TIMES DAILY What changed: See the new instructions.   Trulance 3 MG Tabs Generic drug: Plecanatide Take 1 tablet by mouth daily.   Victoza 18 MG/3ML Sopn Generic drug: liraglutide Inject 0.6 mg into the skin daily.   vitamin C 1000 MG tablet Take 1,000 mg by mouth in the morning and at bedtime.   zinc gluconate 50 MG tablet Take 50 mg by mouth daily.            Discharge Care Instructions  (From admission, onward)         Start     Ordered   07/07/20 0000  Discharge wound care:       Comments: Moist-to-dry dressing changes to sacral wound TWICE DAILY using kerlix, normal saline, ABDs.   07/07/20 0949   07/03/20 0000  Discharge wound care:       Comments: Moist-to-dry dressing changes to sacral wound TWICE DAILY using kerlix, normal saline, ABDs   07/03/20 1220          Follow-up [place]. Schedule an appointment as soon as possible for a visit in 1 week(s).   Contact information: [place] [address] [phone]        [facility] Follow up.   Contact information: [place], [place] [address]  [phone]              Allergies  Allergen Reactions  . No Known Allergies     Procedures/Studies: DG Abd 1 View  Result Date: 06/26/2020 CLINICAL DATA:  60 year old female with impacted colon. EXAM: ABDOMEN - 1 VIEW COMPARISON:  CT abdomen pelvis dated 06/16/2020. FINDINGS: Stool within the rectal vault similar to prior CT. There is moderate stool in the distal colon. No bowel dilatation or evidence of obstruction. No free air a calculi. The osseous structures are intact. IMPRESSION: Stool within the rectal vault. No bowel obstruction. Electronically Signed   By: Anner Crete M.D.   On: 06/26/2020 18:28   CT Abdomen Pelvis W  Contrast  Result Date: 06/16/2020 CLINICAL DATA:  Sepsis, sacral wound. EXAM: CT ABDOMEN AND PELVIS WITH CONTRAST TECHNIQUE: Multidetector CT imaging of the abdomen and pelvis was performed using the standard protocol following bolus administration of intravenous contrast. CONTRAST:  143mL OMNIPAQUE IOHEXOL 300 MG/ML  SOLN COMPARISON:  None. FINDINGS: Lower chest: No acute abnormality. Hepatobiliary: No focal hepatic abnormality. Gallbladder unremarkable. Pancreas: No focal abnormality or ductal dilatation. Spleen: No focal abnormality.  Normal size. Adrenals/Urinary Tract: No adrenal abnormality. No focal renal abnormality. No stones or hydronephrosis. Urinary bladder is unremarkable. Stomach/Bowel: Large stool burden in the rectosigmoid colon. No evidence of bowel obstruction. Vascular/Lymphatic: Aortic atherosclerosis. No evidence of aneurysm or adenopathy. Reproductive: Fibroid uterus, unchanged.  No adnexal  mass. Other: No free fluid or free air. Musculoskeletal: Large open sacral decubitus ulcer/wound noted. This extends to the coccyx without definite acute osteomyelitis. IMPRESSION: Large sacral wound extends down to the coccyx without definitive change of acute osteomyelitis. Large stool burden in the rectosigmoid colon concerning for fecal impaction. Fibroid uterus. Aortic atherosclerosis. Electronically Signed   By: Rolm Baptise M.D.   On: 06/16/2020 19:23   DG CHEST PORT 1 VIEW  Result Date: 06/26/2020 CLINICAL DATA:  Fever EXAM: PORTABLE CHEST 1 VIEW COMPARISON:  06/24/2020 FINDINGS: There is mild diffuse pulmonary infiltrate again seen throughout the left lung, likely infectious in the acute setting, similar to that noted on prior examination. Lung volumes are low, however, are stable since prior examination. No pneumothorax or pleural effusion. Cardiac size within normal limits. No acute bone abnormality. IMPRESSION: Stable examination. Diffuse pulmonary infiltrate within the left lung, likely  infectious. Mild pulmonary hypoinflation. Electronically Signed   By: Fidela Salisbury MD   On: 06/26/2020 08:36   DG CHEST PORT 1 VIEW  Result Date: 06/24/2020 CLINICAL DATA:  Hypoxia.  Sacral wound debridement. EXAM: PORTABLE CHEST 1 VIEW COMPARISON:  06/19/2020 FINDINGS: Patchy airspace disease left upper lobe and left lower lobe is new. Right lung is clear. No effusion. Heart size and vascularity normal. IMPRESSION: New area of infiltrate in the left lung suspicious for pneumonia. Electronically Signed   By: Franchot Gallo M.D.   On: 06/24/2020 12:53   DG Chest Port 1 View  Result Date: 06/16/2020 CLINICAL DATA:  Possible sepsis. EXAM: PORTABLE CHEST 1 VIEW COMPARISON:  05/13/2020 FINDINGS: 1530 hours. The lungs are clear without focal pneumonia, edema, pneumothorax or pleural effusion. Linear atelectasis or scarring noted right base. Interstitial markings are diffusely coarsened with chronic features. The cardiopericardial silhouette is within normal limits for size. The visualized bony structures of the thorax show no acute abnormality. IMPRESSION: No active disease. Electronically Signed   By: Misty Stanley M.D.   On: 06/16/2020 15:52   ECHOCARDIOGRAM COMPLETE  Result Date: 06/18/2020    ECHOCARDIOGRAM REPORT   Patient Name:   Tina Griffith Date of Exam: 06/18/2020 Medical Rec #:  [id number]      Height:       65.0 in Accession #:    [id number]     Weight:       165.0 lb Date of Birth:  1960/12/19      BSA:          1.823 m Patient Age:    22 years       BP:           143/66 mmHg Patient Gender: F              HR:           87 bpm. Exam Location:  Inpatient Procedure: 2D Echo, 3D Echo, Cardiac Doppler and Color Doppler Indications:    121-121.4 ST elevation (STEMI) and non-ST elevation (NSTEMI)                 myocardial infarction  History:        Patient has no prior history of Echocardiogram examinations.                 Risk Factors:Dyslipidemia and Current Smoker. Septic shock.                  Sickle cell.  Sonographer:    Roseanna Rainbow RDCS Referring Phys: [id number] [facility]  Sonographer Comments: Suboptimal  parasternal window. IMPRESSIONS  1. Abnormal septal motion . Left ventricular ejection fraction, by estimation, is 60 to 65%. The left ventricle has normal function. The left ventricle has no regional wall motion abnormalities. There is mild left ventricular hypertrophy. Left ventricular diastolic parameters were normal.  2. Right ventricular systolic function is mildly reduced. The right ventricular size is mildly enlarged. There is mildly elevated pulmonary artery systolic pressure.  3. The mitral valve is normal in structure. Trivial mitral valve regurgitation. No evidence of mitral stenosis.  4. The aortic valve is tricuspid. Aortic valve regurgitation is not visualized. No aortic stenosis is present.  5. The inferior vena cava is normal in size with greater than 50% respiratory variability, suggesting right atrial pressure of 3 mmHg. FINDINGS  Left Ventricle: Abnormal septal motion. Left ventricular ejection fraction, by estimation, is 60 to 65%. The left ventricle has normal function. The left ventricle has no regional wall motion abnormalities. The left ventricular internal cavity size was normal in size. There is mild left ventricular hypertrophy. Left ventricular diastolic parameters were normal. Right Ventricle: The right ventricular size is mildly enlarged. No increase in right ventricular wall thickness. Right ventricular systolic function is mildly reduced. There is mildly elevated pulmonary artery systolic pressure. The tricuspid regurgitant  velocity is 2.71 m/s, and with an assumed right atrial pressure of 8 mmHg, the estimated right ventricular systolic pressure is 62.3 mmHg. Left Atrium: Left atrial size was normal in size. Right Atrium: Right atrial size was normal in size. Pericardium: There is no evidence of pericardial effusion. Mitral Valve: The mitral valve is normal in  structure. Trivial mitral valve regurgitation. No evidence of mitral valve stenosis. Tricuspid Valve: The tricuspid valve is normal in structure. Tricuspid valve regurgitation is mild . No evidence of tricuspid stenosis. Aortic Valve: The aortic valve is tricuspid. Aortic valve regurgitation is not visualized. No aortic stenosis is present. Pulmonic Valve: The pulmonic valve was normal in structure. Pulmonic valve regurgitation is not visualized. No evidence of pulmonic stenosis. Aorta: The aortic root is normal in size and structure. Venous: The inferior vena cava is normal in size with greater than 50% respiratory variability, suggesting right atrial pressure of 3 mmHg. IAS/Shunts: The interatrial septum was not well visualized.  LEFT VENTRICLE PLAX 2D LVIDd:         3.80 cm     Diastology LVIDs:         2.30 cm     LV e' medial:    7.29 cm/s LV PW:         1.20 cm     LV E/e' medial:  11.5 LV IVS:        1.20 cm     LV e' lateral:   12.70 cm/s LVOT diam:     2.10 cm     LV E/e' lateral: 6.6 LV SV:         69 LV SV Index:   38 LVOT Area:     3.46 cm  LV Volumes (MOD) LV vol d, MOD A2C: 57.1 ml LV vol d, MOD A4C: 76.1 ml LV vol s, MOD A2C: 22.1 ml LV vol s, MOD A4C: 32.0 ml LV SV MOD A2C:     35.0 ml LV SV MOD A4C:     76.1 ml LV SV MOD BP:      37.9 ml RIGHT VENTRICLE             IVC RV S prime:     15.40 cm/s  IVC diam: 1.60 cm TAPSE (M-mode): 1.8 cm LEFT ATRIUM           Index       RIGHT ATRIUM           Index LA diam:      2.10 cm 1.15 cm/m  RA Area:     17.10 cm LA Vol (A2C): 23.3 ml 12.78 ml/m RA Volume:   50.00 ml  27.43 ml/m LA Vol (A4C): 11.3 ml 6.20 ml/m  AORTIC VALVE LVOT Vmax:   123.00 cm/s LVOT Vmean:  86.200 cm/s LVOT VTI:    0.198 m  AORTA Ao Root diam: 3.00 cm Ao Asc diam:  3.00 cm MITRAL VALVE               TRICUSPID VALVE MV Area (PHT): 5.66 cm    TR Peak grad:   29.4 mmHg MV Decel Time: 134 msec    TR Vmax:        271.00 cm/s MV E velocity: 83.80 cm/s MV A velocity: 75.00 cm/s  SHUNTS MV  E/A ratio:  1.12        Systemic VTI:  0.20 m                            Systemic Diam: 2.10 cm Jenkins Rouge MD Electronically signed by Jenkins Rouge MD Signature Date/Time: 06/18/2020/2:47:27 PM    Final        Discharge Exam: Vitals:   07/06/20 1445 07/07/20 0645  BP: 130/76 117/65  Pulse: 100 (!) 103  Resp: 18 18  Temp: 98.2 F (36.8 C) 98.3 F (36.8 C)  SpO2: 99% 97%    General: Pt is alert, awake, not in acute distress Cardiovascular: RRR, S1/S2 +, no edema Respiratory: CTA bilaterally, no wheezing, no rhonchi, no respiratory distress, no conversational dyspnea  Abdominal: Soft, NT, ND, bowel sounds + Extremities: no edema, no cyanosis Psych: Normal mood and affect, stable judgement and insight     The results of significant diagnostics from this hospitalization (including imaging, microbiology, ancillary and laboratory) are listed below for reference.     Microbiology: No results found for this or any previous visit (from the past 240 hour(s)).   Labs: BNP (last 3 results) No results for input(s): BNP in the last 8760 hours. Basic Metabolic Panel: Recent Labs  Lab 07/01/20 0450 07/02/20 0513  NA 140 141  K 3.6 3.5  CL 106 108  CO2 24 22  GLUCOSE 105* 101*  BUN 10 11  CREATININE 0.64 0.57  CALCIUM 9.1 9.0  MG 1.8 1.8  PHOS 3.1  --    Liver Function Tests: Recent Labs  Lab 07/01/20 0450  ALBUMIN 2.1*   No results for input(s): LIPASE, AMYLASE in the last 168 hours. No results for input(s): AMMONIA in the last 168 hours. CBC: Recent Labs  Lab 07/01/20 0450 07/02/20 0513 07/03/20 0516 07/04/20 0517 07/05/20 0541  WBC 16.1* 13.8* 14.3* 13.3* 13.7*  NEUTROABS 10.9*  --   --   --   --   HGB 8.8* 9.0* 9.4* 9.2* 10.0*  HCT 27.9* 28.0* 29.5* 29.1* 31.8*  MCV 83.5 83.1 84.0 85.1 84.6  PLT 722* 655* 796* 651* 705*   Cardiac Enzymes: No results for input(s): CKTOTAL, CKMB, CKMBINDEX, TROPONINI in the last 168 hours. BNP: Invalid input(s):  POCBNP CBG: Recent Labs  Lab 07/06/20 0720 07/06/20 1126 07/06/20 1730 07/06/20 2231 07/07/20 0744  GLUCAP 105* 99 122* 110*  79   D-Dimer No results for input(s): DDIMER in the last 72 hours. Hgb A1c No results for input(s): HGBA1C in the last 72 hours. Lipid Profile No results for input(s): CHOL, HDL, LDLCALC, TRIG, CHOLHDL, LDLDIRECT in the last 72 hours. Thyroid function studies No results for input(s): TSH, T4TOTAL, T3FREE, THYROIDAB in the last 72 hours.  Invalid input(s): FREET3 Anemia work up No results for input(s): VITAMINB12, FOLATE, FERRITIN, TIBC, IRON, RETICCTPCT in the last 72 hours. Urinalysis    Component Value Date/Time   COLORURINE YELLOW 06/26/2020 1437   APPEARANCEUR CLEAR 06/26/2020 1437   APPEARANCEUR Clear 07/25/2014 1100   LABSPEC 1.013 06/26/2020 1437   PHURINE 7.0 06/26/2020 1437   GLUCOSEU NEGATIVE 06/26/2020 1437   HGBUR NEGATIVE 06/26/2020 1437   BILIRUBINUR NEGATIVE 06/26/2020 1437   BILIRUBINUR Negative 07/25/2014 1100   KETONESUR NEGATIVE 06/26/2020 1437   PROTEINUR NEGATIVE 06/26/2020 1437   UROBILINOGEN 0.2 10/16/2014 1355   NITRITE NEGATIVE 06/26/2020 1437   LEUKOCYTESUR NEGATIVE 06/26/2020 1437   Sepsis Labs Invalid input(s): PROCALCITONIN,  WBC,  LACTICIDVEN Microbiology No results found for this or any previous visit (from the past 240 hour(s)).   Patient was seen and examined on the day of discharge and was found to be in stable condition. Time coordinating discharge: 35 minutes including assessment and coordination of care, as well as examination of the patient.   SIGNED:  [name], DO [facility] 07/07/2020, 9:50 AM

## 2020-07-04 LAB — CBC
HCT: 29.1 % — ABNORMAL LOW (ref 36.0–46.0)
Hemoglobin: 9.2 g/dL — ABNORMAL LOW (ref 12.0–15.0)
MCH: 26.9 pg (ref 26.0–34.0)
MCHC: 31.6 g/dL (ref 30.0–36.0)
MCV: 85.1 fL (ref 80.0–100.0)
Platelets: 651 10*3/uL — ABNORMAL HIGH (ref 150–400)
RBC: 3.42 MIL/uL — ABNORMAL LOW (ref 3.87–5.11)
RDW: 21.5 % — ABNORMAL HIGH (ref 11.5–15.5)
WBC: 13.3 10*3/uL — ABNORMAL HIGH (ref 4.0–10.5)
nRBC: 0 % (ref 0.0–0.2)

## 2020-07-04 LAB — GLUCOSE, CAPILLARY
Glucose-Capillary: 91 mg/dL (ref 70–99)
Glucose-Capillary: 110 mg/dL — ABNORMAL HIGH (ref 70–99)
Glucose-Capillary: 94 mg/dL (ref 70–99)
Glucose-Capillary: 99 mg/dL (ref 70–99)

## 2020-07-04 NOTE — TOC Progression Note (Addendum)
Transition of Care [facility]) - Progression Note    Patient Details  Name: Tina Griffith MRN: [id number] Date of Birth: 1961-02-01  Transition of Care [facility]) CM/SW Contact  Leeroy Cha, RN Phone Number: 07/04/2020, 12:28 PM  Clinical Narrative:    [id number]/spoke with the daughter [facility] and [facility] have made bed offers, have not heard back from [facility]. Encouraged daughter to call [facility] to see if they can accept the patient.  Will start auth when have facility name. tct-sheree at [facility], message to please return call about bed status. tcf-Sheree at [facility], unable to accept due to no beds. tct-devon the daughter informed her that the only places that have made bed offere\s are [facility] and [facility] does not want [place].  Will start insurance auth with [facility].  Expected Discharge Plan: [place] (vs home with [facility]) Barriers to Discharge: Continued Medical Work up  Expected Discharge Plan and Services Expected Discharge Plan: [place] (vs home with [facility]) In-house Referral: Clinical Social Work     Living arrangements for the past 2 months: Single Family Home                                       Social Determinants of Health (SDOH) Interventions    Readmission Risk Interventions Readmission Risk Prevention Plan 06/18/2020  Transportation Screening Complete  PCP or Specialist Appt within 5-7 Days Complete  Home Care Screening Complete  Medication Review (RN CM) Complete  Some recent data might be hidden

## 2020-07-04 NOTE — Progress Notes (Signed)
Physical Therapy Treatment Patient Details Name: Tina Griffith MRN: [id number] DOB: 1961-02-21 Today's Date: 07/04/2020    History of Present Illness Patient is 60 y.o. female with PMH significant for MS, Lt hemiplegia, HTN, and diagnosed with COVID-19 pneumonia in December 2021.  At baseline, she was using a walker and wheelchair PTA for COVID.  Since then she has remained bedbound at home and has developed sacral decubitus ulcers. Patient admitted for sepsis secondary to infected sacral decubitus ulcer and underwent surgical debridement on 1/27.    PT Comments    Pt in air mattress bed waiting to bed fed lunch.  Wearing B Prevalon Boots.  Agreed to TE's.  Performed B LE TE's but limited R knee flex due to pain and required increased assist L LE which required PROM.  Then + 2 side by side assist to scoot to [facility] and reposition to comfort/pressure relief c/o "buttock" pain.   Follow Up Recommendations  SNF     Equipment Recommendations  None recommended by PT    Recommendations for Other Services       Precautions / Restrictions Precautions Precautions: Fall Precaution Comments: non amb    Mobility  Bed Mobility Overal bed mobility: Needs Assistance Bed Mobility: Rolling           General bed mobility comments: + 2 Total Assist to scoot to [facility] and reposition to comfort    Transfers                    Ambulation/Gait                 Stairs             Wheelchair Mobility    Modified Rankin (Stroke Patients Only)       Balance                                            Cognition Arousal/Alertness: Awake/alert Behavior During Therapy: Flat affect;WFL for tasks assessed/performed Overall Cognitive Status: Within Functional Limits for tasks assessed Area of Impairment: Orientation                 Orientation Level: Disoriented to;Place;Time;Situation             General Comments: AxO x 2 following  commands and expressive to needs      Exercises General Exercises - Lower Extremity Ankle Circles/Pumps: PROM;Both;15 reps Quad Sets: Right;Supine;10 reps;AAROM Heel Slides: PROM;AAROM;5 reps;Both;Supine Hip ABduction/ADduction: PROM;AAROM;Supine;Both;5 reps Straight Leg Raises: PROM;Both;5 reps;Supine    General Comments        Pertinent Vitals/Pain Pain Assessment: Faces Faces Pain Scale: Hurts little more Pain Location: sacrum, buttock Pain Descriptors / Indicators: Discomfort;Grimacing Pain Intervention(s): Monitored during session;Repositioned    Home Living                      Prior Function            PT Goals (current goals can now be found in the care plan section) Progress towards PT goals: Progressing toward goals    Frequency    Min 1X/week      PT Plan Current plan remains appropriate    Co-evaluation              AM-PAC PT "6 Clicks" Mobility   Outcome Measure  Help needed turning from your back  to your side while in a flat bed without using bedrails?: Total Help needed moving from lying on your back to sitting on the side of a flat bed without using bedrails?: Total Help needed moving to and from a bed to a chair (including a wheelchair)?: Total Help needed standing up from a chair using your arms (e.g., wheelchair or bedside chair)?: Total Help needed to walk in hospital room?: Total Help needed climbing 3-5 steps with a railing? : Total 6 Click Score: 6    End of Session   Activity Tolerance: Patient tolerated treatment well Patient left: in bed;with call bell/phone within reach;with bed alarm set   PT Visit Diagnosis: Muscle weakness (generalized) (M62.81);Difficulty in walking, not elsewhere classified (R26.2)     Time: 3818-2993 PT Time Calculation (min) (ACUTE ONLY): 16 min  Charges:  $Therapeutic Exercise: 8-22 mins                     Rica Koyanagi  PTA Acute  Rehabilitation Services Pager       [phone] Office      [phone]

## 2020-07-04 NOTE — Progress Notes (Signed)
PROGRESS NOTE    Tina Griffith  ZOX:[id number] DOB: 1960/07/15 DOA: 06/16/2020 PCP: [name]     Brief Narrative:  Tina Griffith is a 60 y.o.femalewith PMH significant for multiple sclerosis, movement disorder, hypertension, constipation and COVID-19 pneumonia in December 2021. At baseline, she was using a walker and wheelchair up until [place] admission. Since then she has remained bedbound at home and has developed sacral decubitus ulcers. On 1/24, patient was brought to the ED with complaint of failure to thrive, worsening of gluteal/sacral pressure sores. Since last hospitalization 4 weeks ago for Covid pneumonia, patient has had anorexia, weight loss, generalized fatigue and worsening lower extremity edema.  Patient was admitted for sepsis secondary to infected sacral decubitus ulcer. On 1/27, patient underwent debridement of sacral decubitus ulcer by Dr. Harlow Asa.Infectious disease consultation was obtained. Patient was initially placed on broad-spectrum IV antibiotics then on 2 weeks course of oral antibiotics. General surgery and infectious disease signed off.  New events last 24 hours / Subjective: No complaints on examination today.  Feeling well  Assessment & Plan:   Principal Problem:   Sepsis with acute organ dysfunction without septic shock (HCC) Active Problems:   Multiple sclerosis (HCC)   Chronic kidney disease   Decubital ulcer   Essential (primary) hypertension   Hypomagnesemia   Anterior optic neuritis   Hemiplegia, spastic (HCC)   Leucocytosis   UTI (urinary tract infection)   Wound infection   Malnutrition of moderate degree   Anemia of chronic disease   Fever   Hypoxia   Impaction of colon (HCC)   Sepsis secondary to infected stage IV sacral decubitus ulcer/blood cultures positive for Bacteroides ovatus -Patient presented with stage IV sacral decubitus ulcer. Seen in consultation by wound care nurse and general surgery. Status post  debridement of sacral decubitus ulcer per general surgery 06/19/2020. -1/2 blood cultures from 06/16/2020 + for bacteriodes ovatus. Repeat blood cultures 06/20/2020 negative to date. -ID consulted and was following initially patient was on IV ertapenem and subsequently IV daptomycin. Patient transitioned to oral Augmentin and doxycycline for total of 2 weeks through 07/01/2020 per initial ID recommendation.  -Due to fluctuating leukocytosis and fever, patient was placed on IV Zosyn x5 days per ID. Last day 2/9 -Follow up with wound clinic   E. coli UTI -Completed antibiotics  Multiple sclerosis -Stable. Continue home regimen  Pain management -Continue home regimen  Hypertension -Continue Toprol-XL  Well-controlled diabetes mellitus type 2 -Hemoglobin A1c 6.5 (06/18/2020) -Continue sliding scale insulin  Elevated troponin -Patient noted to have elevated troponin up to 112 which trended down. Patient asymptomatic. 2D echo with EF of 60 to 65%, mild LVH. Continue aspirin and statin.   History of IBS -Continue Trulance, PPI.    In agreement with assessment of the pressure ulcer as below:  Pressure Injury 06/17/20 Buttocks Right;Anterior Stage 4 - Full thickness tissue loss with exposed bone, tendon or muscle. Deep Oval shaped pressure wound (6cmx4cmx3cm) muscle and bone exposed, areas of necrotic tissue at the edges, red & yellow inside w (Active)  06/17/20 2200  Location: Buttocks  Location Orientation: Right;Anterior  Staging: Stage 4 - Full thickness tissue loss with exposed bone, tendon or muscle.  Wound Description (Comments): Deep Oval shaped pressure wound (6cmx4cmx3cm) muscle and bone exposed, areas of necrotic tissue at the edges, red & yellow inside w/active bleeding & tunneling, foul smelling  Present on Admission: Yes     Nutrition Problem: Moderate Malnutrition Etiology: chronic illness,wound healing (MS)   DVT prophylaxis:  enoxaparin (LOVENOX)  injection 40 mg Start: 06/20/20 0800 SCD's Start: 06/19/20 1520  Code Status: Full code Family Communication: No family at bedside Disposition Plan:  Status is: Inpatient  Remains inpatient appropriate because:Unsafe d/c plan   Dispo: The patient is from: Home              Anticipated d/c is to: SNF              Anticipated d/c date is: 1 day              Patient currently is medically stable to d/c.  Awaiting SNF placement   Difficult to place patient No   Antimicrobials:  Anti-infectives (From admission, onward)   Start     Dose/Rate Route Frequency Ordered Stop   07/01/20 1600  piperacillin-tazobactam (ZOSYN) IVPB 3.375 g        3.375 g 12.5 mL/hr over 240 Minutes Intravenous Every 8 hours 07/01/20 1542 07/02/20 0931   06/26/20 1800  piperacillin-tazobactam (ZOSYN) IVPB 3.375 g        3.375 g 12.5 mL/hr over 240 Minutes Intravenous Every 8 hours 06/26/20 1643 06/30/20 2237   06/22/20 2200  amoxicillin-clavulanate (AUGMENTIN) 875-125 MG per tablet 1 tablet  Status:  Discontinued        1 tablet Oral Every 12 hours 06/22/20 1548 06/26/20 1639   06/22/20 2200  doxycycline (VIBRA-TABS) tablet 100 mg  Status:  Discontinued        100 mg Oral Every 12 hours 06/22/20 1548 06/26/20 1639   06/20/20 2000  DAPTOmycin (CUBICIN) 650 mg in sodium chloride 0.9 % IVPB  Status:  Discontinued        650 mg 226 mL/hr over 30 Minutes Intravenous Daily 06/20/20 1320 06/22/20 1548   06/20/20 1800  ertapenem (INVANZ) 1,000 mg in sodium chloride 0.9 % 100 mL IVPB  Status:  Discontinued        1 g 200 mL/hr over 30 Minutes Intravenous Every 24 hours 06/20/20 1402 06/22/20 1548   06/19/20 2000  doxycycline (VIBRA-TABS) tablet 100 mg  Status:  Discontinued        100 mg Oral 2 times per day 06/19/20 1324 06/20/20 1320   06/17/20 1200  vancomycin (VANCOREADY) IVPB 750 mg/150 mL  Status:  Discontinued        750 mg 150 mL/hr over 60 Minutes Intravenous Every 12 hours 06/16/20 2115 06/19/20 1322    06/17/20 0015  piperacillin-tazobactam (ZOSYN) IVPB 3.375 g  Status:  Discontinued        3.375 g 12.5 mL/hr over 240 Minutes Intravenous Every 8 hours 06/17/20 0004 06/20/20 1402   06/17/20 0000  piperacillin-tazobactam (ZOSYN) IVPB 3.375 g  Status:  Discontinued        3.375 g 100 mL/hr over 30 Minutes Intravenous Every 6 hours 06/16/20 2329 06/17/20 0003   06/16/20 2200  cefTRIAXone (ROCEPHIN) 2 g in sodium chloride 0.9 % 100 mL IVPB  Status:  Discontinued        2 g 200 mL/hr over 30 Minutes Intravenous Every 24 hours 06/16/20 2101 06/16/20 2329   06/16/20 2115  vancomycin (VANCOREADY) IVPB 1500 mg/300 mL        1,500 mg 150 mL/hr over 120 Minutes Intravenous  Once 06/16/20 2106 06/17/20 0235   06/16/20 1545  piperacillin-tazobactam (ZOSYN) IVPB 3.375 g        3.375 g 100 mL/hr over 30 Minutes Intravenous  Once 06/16/20 1533 06/16/20 1633       Objective: Vitals:  07/03/20 2126 07/04/20 0549 07/04/20 0851 07/04/20 0853  BP: 127/67 123/72    Pulse: (!) 105 (!) 107    Resp: 16 16    Temp: 98.2 F (36.8 C) 98.7 F (37.1 C)    TempSrc: Oral Oral    SpO2: 96% 97% 97% 97%  Weight:      Height:        Intake/Output Summary (Last 24 hours) at 07/04/2020 1241 Last data filed at 07/04/2020 1000 Gross per 24 hour  Intake 240 ml  Output 1200 ml  Net -960 ml   Filed Weights   06/17/20 1145 06/18/20 0500 06/19/20 0844  Weight: 74.8 kg 81.8 kg 81.8 kg    Examination: General exam: Appears calm and comfortable  Respiratory system: Clear to auscultation. Respiratory effort normal. Cardiovascular system: S1 & S2 heard, RRR. No pedal edema. Gastrointestinal system: Abdomen is nondistended, soft and nontender. Normal bowel sounds heard. Central nervous system: Alert and oriented. Non focal exam. Speech clear  Extremities: Symmetric in appearance bilaterally  Psychiatry: Judgement and insight appear stable. Mood & affect appropriate.   Data Reviewed: I have personally reviewed  following labs and imaging studies  CBC: Recent Labs  Lab 06/28/20 0536 06/29/20 0758 07/01/20 0450 07/02/20 0513 07/03/20 0516 07/04/20 0517  WBC 12.1* 15.5* 16.1* 13.8* 14.3* 13.3*  NEUTROABS 8.3* 11.7* 10.9*  --   --   --   HGB 8.4* 9.6* 8.8* 9.0* 9.4* 9.2*  HCT 26.1* 29.4* 27.9* 28.0* 29.5* 29.1*  MCV 81.6 79.9* 83.5 83.1 84.0 85.1  PLT 683* 697* 722* 655* 796* 993*   Basic Metabolic Panel: Recent Labs  Lab 06/28/20 0536 06/29/20 0758 06/30/20 0431 07/01/20 0450 07/02/20 0513  NA 135 138 139 140 141  K 3.9 4.1 3.5 3.6 3.5  CL 102 103 104 106 108  CO2 25 23 23 24 22   GLUCOSE 95 110* 108* 105* 101*  BUN 11 11 15 10 11   CREATININE 0.55 0.58 0.53 0.64 0.57  CALCIUM 8.6* 8.9 8.7* 9.1 9.0  MG 1.9 1.6* 2.2 1.8 1.8  PHOS  --   --   --  3.1  --    GFR: Estimated Creatinine Clearance: 80 mL/min (by C-G formula based on SCr of 0.57 mg/dL). Liver Function Tests: Recent Labs  Lab 07/01/20 0450  ALBUMIN 2.1*   No results for input(s): LIPASE, AMYLASE in the last 168 hours. No results for input(s): AMMONIA in the last 168 hours. Coagulation Profile: No results for input(s): INR, PROTIME in the last 168 hours. Cardiac Enzymes: No results for input(s): CKTOTAL, CKMB, CKMBINDEX, TROPONINI in the last 168 hours. BNP (last 3 results) No results for input(s): PROBNP in the last 8760 hours. HbA1C: No results for input(s): HGBA1C in the last 72 hours. CBG: Recent Labs  Lab 07/03/20 1133 07/03/20 1631 07/03/20 2124 07/04/20 0750 07/04/20 1138  GLUCAP 118* 94 136* 91 110*   Lipid Profile: No results for input(s): CHOL, HDL, LDLCALC, TRIG, CHOLHDL, LDLDIRECT in the last 72 hours. Thyroid Function Tests: No results for input(s): TSH, T4TOTAL, FREET4, T3FREE, THYROIDAB in the last 72 hours. Anemia Panel: No results for input(s): VITAMINB12, FOLATE, FERRITIN, TIBC, IRON, RETICCTPCT in the last 72 hours. Sepsis Labs: No results for input(s): PROCALCITON, LATICACIDVEN in  the last 168 hours.  Recent Results (from the past 240 hour(s))  Culture, Urine     Status: Abnormal   Collection Time: 06/26/20  2:37 PM   Specimen: Urine, Random  Result Value Ref Range Status  Specimen Description   Final    URINE, RANDOM Performed at [facility], [place] [address], [place], [place] [address]    Special Requests   Final    NONE Performed at [facility], [place] [address] [place], [place] [address]    Culture (A)  Final    <10,000 COLONIES/mL INSIGNIFICANT GROWTH Performed at [facility] [address] [place], [place] [address]    Report Status 06/28/2020 FINAL  Final  Culture, blood (Routine X 2) w Reflex to ID Panel     Status: None   Collection Time: 06/26/20  5:22 PM   Specimen: BLOOD RIGHT HAND  Result Value Ref Range Status   Specimen Description   Final    BLOOD RIGHT HAND Performed at [facility] [address], [place], [place] [address]    Special Requests   Final    BOTTLES DRAWN AEROBIC ONLY Blood Culture results may not be optimal due to an inadequate volume of blood received in culture bottles Performed at [facility] [address], [place], [place] [address]    Culture   Final    NO GROWTH 5 DAYS Performed at [facility], [place] [address] [place], [place] [address]    Report Status 07/01/2020 FINAL  Final  Culture, blood (Routine X 2) w Reflex to ID Panel     Status: None   Collection Time: 06/26/20  5:22 PM   Specimen: BLOOD RIGHT HAND  Result Value Ref Range Status   Specimen Description   Final    BLOOD RIGHT HAND Performed at [facility] [address] [place], [place] [address]    Special Requests   Final    BOTTLES DRAWN AEROBIC ONLY Blood Culture results may not be optimal due to an inadequate volume of blood received in culture bottles Performed at [facility] [address], [place],  [place] [address]    Culture   Final    NO GROWTH 5 DAYS Performed at [facility], [place] [address], [place], [place] [address]    Report Status 07/01/2020 FINAL  Final      Radiology Studies: No results found.    Scheduled Meds: . (feeding supplement) PROSource Plus  30 mL Oral TID WC  . aspirin EC  81 mg Oral Daily  . benzonatate  100 mg Oral TID  . Chlorhexidine Gluconate Cloth  6 each Topical Daily  . dalfampridine  10 mg Oral BID  . enoxaparin (LOVENOX) injection  40 mg Subcutaneous Q24H  . ferrous sulfate  325 mg Oral BID  . fluticasone furoate-vilanterol  1 puff Inhalation Daily  . insulin aspart  0-5 Units Subcutaneous QHS  . insulin aspart  0-9 Units Subcutaneous TID WC  . metoprolol succinate  100 mg Oral Daily  . multivitamin with minerals  1 tablet Oral Daily  . oxybutynin  10 mg Oral Daily  . pantoprazole  40 mg Oral Daily  . Plecanatide  1 tablet Oral Daily  . pravastatin  20 mg Oral q1800  . senna-docusate  1 tablet Oral BID  . sorbitol, milk of mag, mineral oil, glycerin (SMOG) enema  960 mL Rectal Once  . tiZANidine  8 mg Oral TID   Continuous Infusions:   LOS: 18 days      Time spent: 20 minutes   [name], DO [facility] 07/04/2020, 12:41 PM   Available via Epic secure  chat 7am-7pm After these hours, please refer to coverage provider listed on [email or web address]

## 2020-07-05 LAB — GLUCOSE, CAPILLARY
Glucose-Capillary: 92 mg/dL (ref 70–99)
Glucose-Capillary: 119 mg/dL — ABNORMAL HIGH (ref 70–99)
Glucose-Capillary: 116 mg/dL — ABNORMAL HIGH (ref 70–99)
Glucose-Capillary: 127 mg/dL — ABNORMAL HIGH (ref 70–99)

## 2020-07-05 LAB — CBC
HCT: 31.8 % — ABNORMAL LOW (ref 36.0–46.0)
Hemoglobin: 10 g/dL — ABNORMAL LOW (ref 12.0–15.0)
MCH: 26.6 pg (ref 26.0–34.0)
MCHC: 31.4 g/dL (ref 30.0–36.0)
MCV: 84.6 fL (ref 80.0–100.0)
Platelets: 705 10*3/uL — ABNORMAL HIGH (ref 150–400)
RBC: 3.76 MIL/uL — ABNORMAL LOW (ref 3.87–5.11)
RDW: 21.4 % — ABNORMAL HIGH (ref 11.5–15.5)
WBC: 13.7 10*3/uL — ABNORMAL HIGH (ref 4.0–10.5)
nRBC: 0 % (ref 0.0–0.2)

## 2020-07-05 NOTE — TOC Progression Note (Signed)
Transition of Care [facility]) - Progression Note    Patient Details  Name: Tina Griffith MRN: [id number] Date of Birth: 06-25-1960  Transition of Care [facility]) CM/SW Contact  Ross Ludwig, [place] Phone Number: 07/05/2020, 9:31 AM  Clinical Narrative:    CSW was informed that patient has been approved by insurance company.  CSW contacted [facility], per admissions worker, they do not have a bed available at this time.  Admissions worker said to call over the weekend for bed availability.   Expected Discharge Plan: [place] (vs home with [facility]) Barriers to Discharge: Continued Medical Work up  Expected Discharge Plan and Services Expected Discharge Plan: [place] (vs home with [facility]) In-house Referral: Clinical Social Work     Living arrangements for the past 2 months: Single Family Home                                       Social Determinants of Health (SDOH) Interventions    Readmission Risk Interventions Readmission Risk Prevention Plan 06/18/2020  Transportation Screening Complete  PCP or Specialist Appt within 5-7 Days Complete  Home Care Screening Complete  Medication Review (RN CM) Complete  Some recent data might be hidden

## 2020-07-05 NOTE — Progress Notes (Signed)
PROGRESS NOTE    Tina Griffith  UXL:[id number] DOB: 07-Jan-1961 DOA: 06/16/2020 PCP: [name]     Brief Narrative:  Tina Griffith is a 60 y.o.femalewith PMH significant for multiple sclerosis, movement disorder, hypertension, constipation and COVID-19 pneumonia in December 2021. At baseline, she was using a walker and wheelchair up until [place] admission. Since then she has remained bedbound at home and has developed sacral decubitus ulcers. On 1/24, patient was brought to the ED with complaint of failure to thrive, worsening of gluteal/sacral pressure sores. Since last hospitalization 4 weeks ago for Covid pneumonia, patient has had anorexia, weight loss, generalized fatigue and worsening lower extremity edema.  Patient was admitted for sepsis secondary to infected sacral decubitus ulcer. On 1/27, patient underwent debridement of sacral decubitus ulcer by Dr. Harlow Asa.Infectious disease consultation was obtained. Patient was initially placed on broad-spectrum IV antibiotics then on 2 weeks course of oral antibiotics. General surgery and infectious disease signed off.  New events last 24 hours / Subjective: Eating breakfast in bed with nurse tech's assistance. She has no physical complaints this morning.  Assessment & Plan:   Principal Problem:   Sepsis with acute organ dysfunction without septic shock (HCC) Active Problems:   Multiple sclerosis (HCC)   Chronic kidney disease   Decubital ulcer   Essential (primary) hypertension   Hypomagnesemia   Anterior optic neuritis   Hemiplegia, spastic (HCC)   Leucocytosis   UTI (urinary tract infection)   Wound infection   Malnutrition of moderate degree   Anemia of chronic disease   Fever   Hypoxia   Impaction of colon (HCC)   Sepsis secondary to infected stage IV sacral decubitus ulcer/blood cultures positive for Bacteroides ovatus -Patient presented with stage IV sacral decubitus ulcer. Seen in consultation by  wound care nurse and general surgery. Status post debridement of sacral decubitus ulcer per general surgery 06/19/2020. -1/2 blood cultures from 06/16/2020 + for bacteriodes ovatus. Repeat blood cultures 06/20/2020 negative to date. -ID consulted and was following initially patient was on IV ertapenem and subsequently IV daptomycin. Patient transitioned to oral Augmentin and doxycycline for total of 2 weeks through 07/01/2020 per initial ID recommendation.  -Due to fluctuating leukocytosis and fever, patient was placed on IV Zosyn x5 days per ID. Last day 2/9 -Follow up with wound clinic   E. coli UTI -Completed antibiotics  Multiple sclerosis -Stable. Continue home regimen  Pain management -Continue home regimen  Hypertension -Continue Toprol-XL  Well-controlled diabetes mellitus type 2 -Hemoglobin A1c 6.5 (06/18/2020) -Continue sliding scale insulin  Elevated troponin -Patient noted to have elevated troponin up to 112 which trended down. Patient asymptomatic. 2D echo with EF of 60 to 65%, mild LVH. Continue aspirin and statin.   History of IBS -Continue Trulance, PPI.    In agreement with assessment of the pressure ulcer as below:  Pressure Injury 06/17/20 Buttocks Right;Anterior Stage 4 - Full thickness tissue loss with exposed bone, tendon or muscle. Deep Oval shaped pressure wound (6cmx4cmx3cm) muscle and bone exposed, areas of necrotic tissue at the edges, red & yellow inside w (Active)  06/17/20 2200  Location: Buttocks  Location Orientation: Right;Anterior  Staging: Stage 4 - Full thickness tissue loss with exposed bone, tendon or muscle.  Wound Description (Comments): Deep Oval shaped pressure wound (6cmx4cmx3cm) muscle and bone exposed, areas of necrotic tissue at the edges, red & yellow inside w/active bleeding & tunneling, foul smelling  Present on Admission: Yes     Nutrition Problem: Moderate Malnutrition Etiology: chronic  illness,wound healing  (MS)   DVT prophylaxis:  enoxaparin (LOVENOX) injection 40 mg Start: 06/20/20 0800 SCD's Start: 06/19/20 1520  Code Status: Full code Family Communication: No family at bedside Disposition Plan:  Status is: Inpatient  Remains inpatient appropriate because:Unsafe d/c plan   Dispo: The patient is from: Home              Anticipated d/c is to: SNF              Anticipated d/c date is: 1 day              Patient currently is medically stable to d/c.  Awaiting SNF placement, insurance auth pending   Difficult to place patient No   Antimicrobials:  Anti-infectives (From admission, onward)   Start     Dose/Rate Route Frequency Ordered Stop   07/01/20 1600  piperacillin-tazobactam (ZOSYN) IVPB 3.375 g        3.375 g 12.5 mL/hr over 240 Minutes Intravenous Every 8 hours 07/01/20 1542 07/02/20 0931   06/26/20 1800  piperacillin-tazobactam (ZOSYN) IVPB 3.375 g        3.375 g 12.5 mL/hr over 240 Minutes Intravenous Every 8 hours 06/26/20 1643 06/30/20 2237   06/22/20 2200  amoxicillin-clavulanate (AUGMENTIN) 875-125 MG per tablet 1 tablet  Status:  Discontinued        1 tablet Oral Every 12 hours 06/22/20 1548 06/26/20 1639   06/22/20 2200  doxycycline (VIBRA-TABS) tablet 100 mg  Status:  Discontinued        100 mg Oral Every 12 hours 06/22/20 1548 06/26/20 1639   06/20/20 2000  DAPTOmycin (CUBICIN) 650 mg in sodium chloride 0.9 % IVPB  Status:  Discontinued        650 mg 226 mL/hr over 30 Minutes Intravenous Daily 06/20/20 1320 06/22/20 1548   06/20/20 1800  ertapenem (INVANZ) 1,000 mg in sodium chloride 0.9 % 100 mL IVPB  Status:  Discontinued        1 g 200 mL/hr over 30 Minutes Intravenous Every 24 hours 06/20/20 1402 06/22/20 1548   06/19/20 2000  doxycycline (VIBRA-TABS) tablet 100 mg  Status:  Discontinued        100 mg Oral 2 times per day 06/19/20 1324 06/20/20 1320   06/17/20 1200  vancomycin (VANCOREADY) IVPB 750 mg/150 mL  Status:  Discontinued        750 mg 150 mL/hr  over 60 Minutes Intravenous Every 12 hours 06/16/20 2115 06/19/20 1322   06/17/20 0015  piperacillin-tazobactam (ZOSYN) IVPB 3.375 g  Status:  Discontinued        3.375 g 12.5 mL/hr over 240 Minutes Intravenous Every 8 hours 06/17/20 0004 06/20/20 1402   06/17/20 0000  piperacillin-tazobactam (ZOSYN) IVPB 3.375 g  Status:  Discontinued        3.375 g 100 mL/hr over 30 Minutes Intravenous Every 6 hours 06/16/20 2329 06/17/20 0003   06/16/20 2200  cefTRIAXone (ROCEPHIN) 2 g in sodium chloride 0.9 % 100 mL IVPB  Status:  Discontinued        2 g 200 mL/hr over 30 Minutes Intravenous Every 24 hours 06/16/20 2101 06/16/20 2329   06/16/20 2115  vancomycin (VANCOREADY) IVPB 1500 mg/300 mL        1,500 mg 150 mL/hr over 120 Minutes Intravenous  Once 06/16/20 2106 06/17/20 0235   06/16/20 1545  piperacillin-tazobactam (ZOSYN) IVPB 3.375 g        3.375 g 100 mL/hr over 30 Minutes Intravenous  Once 06/16/20  1533 06/16/20 1633       Objective: Vitals:   07/04/20 1343 07/04/20 2102 07/05/20 0552 07/05/20 0842  BP: 115/78 101/60 (!) 139/58   Pulse: 100 97 99   Resp: 18 16 16    Temp: 98.7 F (37.1 C) 99.2 F (37.3 C) 98.9 F (37.2 C)   TempSrc: Oral Oral Oral   SpO2: 100% 95% 98% 95%  Weight:      Height:        Intake/Output Summary (Last 24 hours) at 07/05/2020 0925 Last data filed at 07/05/2020 0838 Gross per 24 hour  Intake 360 ml  Output 1800 ml  Net -1440 ml   Filed Weights   06/17/20 1145 06/18/20 0500 06/19/20 0844  Weight: 74.8 kg 81.8 kg 81.8 kg   Examination: General exam: Appears calm and comfortable  Respiratory system: Clear to auscultation. Respiratory effort normal. Cardiovascular system: S1 & S2 heard, RRR. No pedal edema. Gastrointestinal system: Abdomen is nondistended, soft and nontender. Normal bowel sounds heard. Central nervous system: Alert and oriented. Non focal exam. Speech clear  Psychiatry: Judgement and insight appear stable. Mood & affect appropriate.     Data Reviewed: I have personally reviewed following labs and imaging studies  CBC: Recent Labs  Lab 06/29/20 0758 07/01/20 0450 07/02/20 0513 07/03/20 0516 07/04/20 0517 07/05/20 0541  WBC 15.5* 16.1* 13.8* 14.3* 13.3* 13.7*  NEUTROABS 11.7* 10.9*  --   --   --   --   HGB 9.6* 8.8* 9.0* 9.4* 9.2* 10.0*  HCT 29.4* 27.9* 28.0* 29.5* 29.1* 31.8*  MCV 79.9* 83.5 83.1 84.0 85.1 84.6  PLT 697* 722* 655* 796* 651* 818*   Basic Metabolic Panel: Recent Labs  Lab 06/29/20 0758 06/30/20 0431 07/01/20 0450 07/02/20 0513  NA 138 139 140 141  K 4.1 3.5 3.6 3.5  CL 103 104 106 108  CO2 23 23 24 22   GLUCOSE 110* 108* 105* 101*  BUN 11 15 10 11   CREATININE 0.58 0.53 0.64 0.57  CALCIUM 8.9 8.7* 9.1 9.0  MG 1.6* 2.2 1.8 1.8  PHOS  --   --  3.1  --    GFR: Estimated Creatinine Clearance: 80 mL/min (by C-G formula based on SCr of 0.57 mg/dL). Liver Function Tests: Recent Labs  Lab 07/01/20 0450  ALBUMIN 2.1*   No results for input(s): LIPASE, AMYLASE in the last 168 hours. No results for input(s): AMMONIA in the last 168 hours. Coagulation Profile: No results for input(s): INR, PROTIME in the last 168 hours. Cardiac Enzymes: No results for input(s): CKTOTAL, CKMB, CKMBINDEX, TROPONINI in the last 168 hours. BNP (last 3 results) No results for input(s): PROBNP in the last 8760 hours. HbA1C: No results for input(s): HGBA1C in the last 72 hours. CBG: Recent Labs  Lab 07/04/20 0750 07/04/20 1138 07/04/20 1621 07/04/20 2059 07/05/20 0819  GLUCAP 91 110* 94 99 92   Lipid Profile: No results for input(s): CHOL, HDL, LDLCALC, TRIG, CHOLHDL, LDLDIRECT in the last 72 hours. Thyroid Function Tests: No results for input(s): TSH, T4TOTAL, FREET4, T3FREE, THYROIDAB in the last 72 hours. Anemia Panel: No results for input(s): VITAMINB12, FOLATE, FERRITIN, TIBC, IRON, RETICCTPCT in the last 72 hours. Sepsis Labs: No results for input(s): PROCALCITON, LATICACIDVEN in the last  168 hours.  Recent Results (from the past 240 hour(s))  Culture, Urine     Status: Abnormal   Collection Time: 06/26/20  2:37 PM   Specimen: Urine, Random  Result Value Ref Range Status   Specimen Description  Final    URINE, RANDOM Performed at [facility], [place] [address] [place], [place] [address]    Special Requests   Final    NONE Performed at [facility], [place] [address], [place], [place] [address]    Culture (A)  Final    <10,000 COLONIES/mL INSIGNIFICANT GROWTH Performed at [place] [address], [place], [place] [address]    Report Status 06/28/2020 FINAL  Final  Culture, blood (Routine X 2) w Reflex to ID Panel     Status: None   Collection Time: 06/26/20  5:22 PM   Specimen: BLOOD RIGHT HAND  Result Value Ref Range Status   Specimen Description   Final    BLOOD RIGHT HAND Performed at [place] [address], [place], [place] [address]    Special Requests   Final    BOTTLES DRAWN AEROBIC ONLY Blood Culture results may not be optimal due to an inadequate volume of blood received in culture bottles Performed at [place] [address] [place], [place] [address]    Culture   Final    NO GROWTH 5 DAYS Performed at [facility], [place] [address], [place], [place] [address]    Report Status 07/01/2020 FINAL  Final  Culture, blood (Routine X 2) w Reflex to ID Panel     Status: None   Collection Time: 06/26/20  5:22 PM   Specimen: BLOOD RIGHT HAND  Result Value Ref Range Status   Specimen Description   Final    BLOOD RIGHT HAND Performed at [place] [facility] [address], [place], [place] [address]    Special Requests   Final    BOTTLES DRAWN AEROBIC ONLY Blood Culture results may not be optimal due to an inadequate volume of blood received in culture bottles Performed at [place] [address] [place],   [address]    Culture   Final    NO GROWTH 5 DAYS Performed at [facility], [place] [address] [place],  [address]    Report Status 07/01/2020 FINAL  Final      Radiology Studies: No results found.    Scheduled Meds: . (feeding supplement) PROSource Plus  30 mL Oral TID WC  . aspirin EC  81 mg Oral Daily  . benzonatate  100 mg Oral TID  . Chlorhexidine Gluconate Cloth  6 each Topical Daily  . dalfampridine  10 mg Oral BID  . enoxaparin (LOVENOX) injection  40 mg Subcutaneous Q24H  . ferrous sulfate  325 mg Oral BID  . fluticasone furoate-vilanterol  1 puff Inhalation Daily  . insulin aspart  0-5 Units Subcutaneous QHS  . insulin aspart  0-9 Units Subcutaneous TID WC  . metoprolol succinate  100 mg Oral Daily  . multivitamin with minerals  1 tablet Oral Daily  . oxybutynin  10 mg Oral Daily  . pantoprazole  40 mg Oral Daily  . Plecanatide  1 tablet Oral Daily  . pravastatin  20 mg Oral q1800  . senna-docusate  1 tablet Oral BID  . sorbitol, milk of mag, mineral oil, glycerin (SMOG) enema  960 mL Rectal Once  . tiZANidine  8 mg Oral TID   Continuous Infusions:   LOS: 19 days      Time spent: 20 minutes   [name], DO [facility] 07/05/2020, 9:25 AM   Available via Epic secure chat 7am-7pm After these  hours, please refer to coverage provider listed on [email or web address]

## 2020-07-06 LAB — GLUCOSE, CAPILLARY
Glucose-Capillary: 105 mg/dL — ABNORMAL HIGH (ref 70–99)
Glucose-Capillary: 99 mg/dL (ref 70–99)
Glucose-Capillary: 122 mg/dL — ABNORMAL HIGH (ref 70–99)
Glucose-Capillary: 110 mg/dL — ABNORMAL HIGH (ref 70–99)

## 2020-07-06 NOTE — Progress Notes (Signed)
PROGRESS NOTE    Tina Griffith  TFT:[id number] DOB: Oct 13, 1960 DOA: 06/16/2020 PCP: [name]     Brief Narrative:  Tina Griffith is a 60 y.o.femalewith PMH significant for multiple sclerosis, movement disorder, hypertension, constipation and COVID-19 pneumonia in December 2021. At baseline, she was using a walker and wheelchair up until [facility] admission. Since then she has remained bedbound at home and has developed sacral decubitus ulcers. On 1/24, patient was brought to the ED with complaint of failure to thrive, worsening of gluteal/sacral pressure sores. Since last hospitalization 4 weeks ago for Covid pneumonia, patient has had anorexia, weight loss, generalized fatigue and worsening lower extremity edema.  Patient was admitted for sepsis secondary to infected sacral decubitus ulcer. On 1/27, patient underwent debridement of sacral decubitus ulcer by Dr. Harlow Asa.Infectious disease consultation was obtained. Patient was initially placed on broad-spectrum IV antibiotics then on 2 weeks course of oral antibiotics. General surgery and infectious disease signed off.  New events last 24 hours / Subjective: She has no physical complaints today, awaiting to eat breakfast.    Assessment & Plan:   Principal Problem:   Sepsis with acute organ dysfunction without septic shock (HCC) Active Problems:   Multiple sclerosis (HCC)   Chronic kidney disease   Decubital ulcer   Essential (primary) hypertension   Hypomagnesemia   Anterior optic neuritis   Hemiplegia, spastic (HCC)   Leucocytosis   UTI (urinary tract infection)   Wound infection   Malnutrition of moderate degree   Anemia of chronic disease   Fever   Hypoxia   Impaction of colon (HCC)   Sepsis secondary to infected stage IV sacral decubitus ulcer/blood cultures positive for Bacteroides ovatus -Patient presented with stage IV sacral decubitus ulcer. Seen in consultation by wound care nurse and general  surgery. Status post debridement of sacral decubitus ulcer per general surgery 06/19/2020. -1/2 blood cultures from 06/16/2020 + for bacteriodes ovatus. Repeat blood cultures 06/20/2020 negative to date. -ID consulted and was following initially patient was on IV ertapenem and subsequently IV daptomycin. Patient transitioned to oral Augmentin and doxycycline for total of 2 weeks through 07/01/2020 per initial ID recommendation.  -Due to fluctuating leukocytosis and fever, patient was placed on IV Zosyn x5 days per ID. Last day 2/9 -Follow up with wound clinic   E. coli UTI -Completed antibiotics  Multiple sclerosis -Stable. Continue home regimen  Pain management -Continue home regimen  Hypertension -Continue Toprol-XL  Well-controlled diabetes mellitus type 2 -Hemoglobin A1c 6.5 (06/18/2020) -Continue sliding scale insulin  Elevated troponin -Patient noted to have elevated troponin up to 112 which trended down. Patient asymptomatic. 2D echo with EF of 60 to 65%, mild LVH. Continue aspirin and statin.   History of IBS -Continue Trulance, PPI.    In agreement with assessment of the pressure ulcer as below:  Pressure Injury 06/17/20 Buttocks Right;Anterior Stage 4 - Full thickness tissue loss with exposed bone, tendon or muscle. Deep Oval shaped pressure wound (6cmx4cmx3cm) muscle and bone exposed, areas of necrotic tissue at the edges, red & yellow inside w (Active)  06/17/20 2200  Location: Buttocks  Location Orientation: Right;Anterior  Staging: Stage 4 - Full thickness tissue loss with exposed bone, tendon or muscle.  Wound Description (Comments): Deep Oval shaped pressure wound (6cmx4cmx3cm) muscle and bone exposed, areas of necrotic tissue at the edges, red & yellow inside w/active bleeding & tunneling, foul smelling  Present on Admission: Yes     Nutrition Problem: Moderate Malnutrition Etiology: chronic illness,wound healing (MS)  DVT prophylaxis:   enoxaparin (LOVENOX) injection 40 mg Start: 06/20/20 0800 SCD's Start: 06/19/20 1520  Code Status: Full code Family Communication: No family at bedside Disposition Plan:  Status is: Inpatient  Remains inpatient appropriate because:Unsafe d/c plan   Dispo: The patient is from: Home              Anticipated d/c is to: SNF              Anticipated d/c date is: 1 day              Patient currently is medically stable to d/c.  Awaiting SNF placement, bed availability pending   Difficult to place patient No   Antimicrobials:  Anti-infectives (From admission, onward)   Start     Dose/Rate Route Frequency Ordered Stop   07/01/20 1600  piperacillin-tazobactam (ZOSYN) IVPB 3.375 g        3.375 g 12.5 mL/hr over 240 Minutes Intravenous Every 8 hours 07/01/20 1542 07/02/20 0931   06/26/20 1800  piperacillin-tazobactam (ZOSYN) IVPB 3.375 g        3.375 g 12.5 mL/hr over 240 Minutes Intravenous Every 8 hours 06/26/20 1643 06/30/20 2237   06/22/20 2200  amoxicillin-clavulanate (AUGMENTIN) 875-125 MG per tablet 1 tablet  Status:  Discontinued        1 tablet Oral Every 12 hours 06/22/20 1548 06/26/20 1639   06/22/20 2200  doxycycline (VIBRA-TABS) tablet 100 mg  Status:  Discontinued        100 mg Oral Every 12 hours 06/22/20 1548 06/26/20 1639   06/20/20 2000  DAPTOmycin (CUBICIN) 650 mg in sodium chloride 0.9 % IVPB  Status:  Discontinued        650 mg 226 mL/hr over 30 Minutes Intravenous Daily 06/20/20 1320 06/22/20 1548   06/20/20 1800  ertapenem (INVANZ) 1,000 mg in sodium chloride 0.9 % 100 mL IVPB  Status:  Discontinued        1 g 200 mL/hr over 30 Minutes Intravenous Every 24 hours 06/20/20 1402 06/22/20 1548   06/19/20 2000  doxycycline (VIBRA-TABS) tablet 100 mg  Status:  Discontinued        100 mg Oral 2 times per day 06/19/20 1324 06/20/20 1320   06/17/20 1200  vancomycin (VANCOREADY) IVPB 750 mg/150 mL  Status:  Discontinued        750 mg 150 mL/hr over 60 Minutes Intravenous  Every 12 hours 06/16/20 2115 06/19/20 1322   06/17/20 0015  piperacillin-tazobactam (ZOSYN) IVPB 3.375 g  Status:  Discontinued        3.375 g 12.5 mL/hr over 240 Minutes Intravenous Every 8 hours 06/17/20 0004 06/20/20 1402   06/17/20 0000  piperacillin-tazobactam (ZOSYN) IVPB 3.375 g  Status:  Discontinued        3.375 g 100 mL/hr over 30 Minutes Intravenous Every 6 hours 06/16/20 2329 06/17/20 0003   06/16/20 2200  cefTRIAXone (ROCEPHIN) 2 g in sodium chloride 0.9 % 100 mL IVPB  Status:  Discontinued        2 g 200 mL/hr over 30 Minutes Intravenous Every 24 hours 06/16/20 2101 06/16/20 2329   06/16/20 2115  vancomycin (VANCOREADY) IVPB 1500 mg/300 mL        1,500 mg 150 mL/hr over 120 Minutes Intravenous  Once 06/16/20 2106 06/17/20 0235   06/16/20 1545  piperacillin-tazobactam (ZOSYN) IVPB 3.375 g        3.375 g 100 mL/hr over 30 Minutes Intravenous  Once 06/16/20 1533 06/16/20 1633  Objective: Vitals:   07/05/20 0842 07/05/20 1336 07/05/20 2105 07/06/20 0541  BP:  118/73 119/84 121/89  Pulse:  (!) 107 98 87  Resp:  18 15 16   Temp:  99 F (37.2 C) 100.2 F (37.9 C) 98.1 F (36.7 C)  TempSrc:  Oral Oral Oral  SpO2: 95% 100% 96% 94%  Weight:      Height:        Intake/Output Summary (Last 24 hours) at 07/06/2020 0901 Last data filed at 07/06/2020 0839 Gross per 24 hour  Intake 720 ml  Output 800 ml  Net -80 ml   Filed Weights   06/17/20 1145 06/18/20 0500 06/19/20 0844  Weight: 74.8 kg 81.8 kg 81.8 kg    Examination: General exam: Appears calm and comfortable  Respiratory system: Clear to auscultation. Respiratory effort normal. Cardiovascular system: S1 & S2 heard, RRR. No pedal edema. Gastrointestinal system: Abdomen is nondistended, soft and nontender. Normal bowel sounds heard. Central nervous system: Alert and oriented. Non focal exam. Speech clear  Psychiatry: Judgement and insight appear stable. Mood & affect appropriate.    Data Reviewed: I have  personally reviewed following labs and imaging studies  CBC: Recent Labs  Lab 07/01/20 0450 07/02/20 0513 07/03/20 0516 07/04/20 0517 07/05/20 0541  WBC 16.1* 13.8* 14.3* 13.3* 13.7*  NEUTROABS 10.9*  --   --   --   --   HGB 8.8* 9.0* 9.4* 9.2* 10.0*  HCT 27.9* 28.0* 29.5* 29.1* 31.8*  MCV 83.5 83.1 84.0 85.1 84.6  PLT 722* 655* 796* 651* 144*   Basic Metabolic Panel: Recent Labs  Lab 06/30/20 0431 07/01/20 0450 07/02/20 0513  NA 139 140 141  K 3.5 3.6 3.5  CL 104 106 108  CO2 23 24 22   GLUCOSE 108* 105* 101*  BUN 15 10 11   CREATININE 0.53 0.64 0.57  CALCIUM 8.7* 9.1 9.0  MG 2.2 1.8 1.8  PHOS  --  3.1  --    GFR: Estimated Creatinine Clearance: 80 mL/min (by C-G formula based on SCr of 0.57 mg/dL). Liver Function Tests: Recent Labs  Lab 07/01/20 0450  ALBUMIN 2.1*   No results for input(s): LIPASE, AMYLASE in the last 168 hours. No results for input(s): AMMONIA in the last 168 hours. Coagulation Profile: No results for input(s): INR, PROTIME in the last 168 hours. Cardiac Enzymes: No results for input(s): CKTOTAL, CKMB, CKMBINDEX, TROPONINI in the last 168 hours. BNP (last 3 results) No results for input(s): PROBNP in the last 8760 hours. HbA1C: No results for input(s): HGBA1C in the last 72 hours. CBG: Recent Labs  Lab 07/05/20 0819 07/05/20 1132 07/05/20 1706 07/05/20 2106 07/06/20 0720  GLUCAP 92 119* 116* 127* 105*   Lipid Profile: No results for input(s): CHOL, HDL, LDLCALC, TRIG, CHOLHDL, LDLDIRECT in the last 72 hours. Thyroid Function Tests: No results for input(s): TSH, T4TOTAL, FREET4, T3FREE, THYROIDAB in the last 72 hours. Anemia Panel: No results for input(s): VITAMINB12, FOLATE, FERRITIN, TIBC, IRON, RETICCTPCT in the last 72 hours. Sepsis Labs: No results for input(s): PROCALCITON, LATICACIDVEN in the last 168 hours.  Recent Results (from the past 240 hour(s))  Culture, Urine     Status: Abnormal   Collection Time: 06/26/20  2:37  PM   Specimen: Urine, Random  Result Value Ref Range Status   Specimen Description   Final    URINE, RANDOM Performed at [facility] [address], [place], [place] [address]    Special Requests   Final  NONE Performed at [facility], [place] [address], [place], [place] [address]    Culture (A)  Final    <10,000 COLONIES/mL INSIGNIFICANT GROWTH Performed at [facility] [address], [place], [place] [address]    Report Status 06/28/2020 FINAL  Final  Culture, blood (Routine X 2) w Reflex to ID Panel     Status: None   Collection Time: 06/26/20  5:22 PM   Specimen: BLOOD RIGHT HAND  Result Value Ref Range Status   Specimen Description   Final    BLOOD RIGHT HAND Performed at [facility] [address], [place], [place] [address]    Special Requests   Final    BOTTLES DRAWN AEROBIC ONLY Blood Culture results may not be optimal due to an inadequate volume of blood received in culture bottles Performed at [facility] [address], [place], [place] [address]    Culture   Final    NO GROWTH 5 DAYS Performed at [facility], [place] [address], [place], [place] [address]    Report Status 07/01/2020 FINAL  Final  Culture, blood (Routine X 2) w Reflex to ID Panel     Status: None   Collection Time: 06/26/20  5:22 PM   Specimen: BLOOD RIGHT HAND  Result Value Ref Range Status   Specimen Description   Final    BLOOD RIGHT HAND Performed at [facility] [address], [place], [place] [address]    Special Requests   Final    BOTTLES DRAWN AEROBIC ONLY Blood Culture results may not be optimal due to an inadequate volume of blood received in culture bottles Performed at [facility] [address] [place], [place] [address]    Culture   Final    NO GROWTH 5 DAYS Performed at [facility], [place] [address], [place],  [address]     Report Status 07/01/2020 FINAL  Final      Radiology Studies: No results found.    Scheduled Meds: . (feeding supplement) PROSource Plus  30 mL Oral TID WC  . aspirin EC  81 mg Oral Daily  . benzonatate  100 mg Oral TID  . Chlorhexidine Gluconate Cloth  6 each Topical Daily  . dalfampridine  10 mg Oral BID  . enoxaparin (LOVENOX) injection  40 mg Subcutaneous Q24H  . ferrous sulfate  325 mg Oral BID  . fluticasone furoate-vilanterol  1 puff Inhalation Daily  . insulin aspart  0-5 Units Subcutaneous QHS  . insulin aspart  0-9 Units Subcutaneous TID WC  . metoprolol succinate  100 mg Oral Daily  . multivitamin with minerals  1 tablet Oral Daily  . oxybutynin  10 mg Oral Daily  . pantoprazole  40 mg Oral Daily  . Plecanatide  1 tablet Oral Daily  . pravastatin  20 mg Oral q1800  . senna-docusate  1 tablet Oral BID  . sorbitol, milk of mag, mineral oil, glycerin (SMOG) enema  960 mL Rectal Once  . tiZANidine  8 mg Oral TID   Continuous Infusions:   LOS: 20 days      Time spent: 10 minutes   [name], DO [facility] 07/06/2020, 9:01 AM   Available via Epic secure chat 7am-7pm After these hours, please refer to coverage provider listed on [email or web address]

## 2020-07-07 LAB — GLUCOSE, CAPILLARY
Glucose-Capillary: 79 mg/dL (ref 70–99)
Glucose-Capillary: 95 mg/dL (ref 70–99)

## 2020-07-07 NOTE — NC FL2 (Signed)
[place] LEVEL OF CARE SCREENING TOOL     IDENTIFICATION  Patient Name: Jonnell Hentges Birthdate: 01-05-1961 Sex: female Admission Date (Current Location): 06/16/2020  [place] and [place] Number:  [name] [id number] [place] and Address:  [facility],  [place], [place]      Provider Number: [id number]  Attending Physician Name and Address:  [name], DO  Relative Name and Phone Number:  daughter, [name] @ [phone]    Current Level of Care: Hospital Recommended Level of Care: [place] Prior Approval Number:    Date Approved/Denied:   PASRR Number: [id number]  Discharge Plan: SNF    Current Diagnoses: Patient Active Problem List   Diagnosis Date Noted  . Fever 06/26/2020  . Hypoxia   . Impaction of colon ([place])   . Anemia of chronic disease   . Malnutrition of moderate degree 06/19/2020  . Wound infection 06/17/2020  . Leucocytosis 06/16/2020  . Sepsis with acute organ dysfunction without septic shock ([place]) 06/16/2020  . UTI (urinary tract infection) 06/16/2020  . Depression with anxiety 04/20/2016  . Insomnia 04/20/2016  . Chronic prescription opiate use 01/16/2016  . Hypertriglyceridemia 06/20/2015  . Abnormal urine odor 10/09/2014  . Chronic kidney disease 10/09/2014  . Elevated WBC count 10/09/2014  . Multiple sclerosis ([place]) 07/25/2014  . Spastic gait 07/25/2014  . Leg pain, left 07/25/2014  . Urinary frequency 07/25/2014  . Constipation 07/25/2014  . Decreased potassium in the blood 04/22/2014  . Hypomagnesemia 04/22/2014  . Ataxic gait 02/28/2014  . Callosity 02/28/2014  . 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 Contracture of ankle and foot joint 02/28/2014  . Decubital ulcer 02/28/2014  . Fibroid 02/28/2014  . Dysfunctional or functional uterine hemorrhage 02/28/2014  . Incisional hernia with obstruction but no gangrene 02/28/2014  . Gonalgia 02/28/2014  .  Extremity pain 02/28/2014  . Decreased motor strength 02/28/2014  . Non-pressure ulcer of lower extremity ([place]) 02/28/2014  . Loss of feeling or sensation 02/28/2014  . Anterior optic neuritis 02/28/2014  . Hemorrhage, postmenopausal 02/28/2014  . Arthritis of knee, degenerative 02/28/2014  . AS (sickle cell trait) ([place]) 02/28/2014  . Hemiplegia, spastic ([place]) 02/28/2014  . Sickle cell trait ([place]) 02/28/2014  . Spastic hemiplegia ([place]) 02/28/2014  . Abnormal results of thyroid function studies 12/28/2013  . Allergic rhinitis 08/22/2013  . Disorder of magnesium metabolism 08/22/2013  . Essential (primary) hypertension 08/22/2013  . Hypercholesteremia 08/22/2013  . Adult hypothyroidism 08/22/2013  . Anemia, iron deficiency 08/22/2013  . Compulsive tobacco user syndrome 08/22/2013  . Absence of bladder continence 08/22/2013  . Hypercholesterolemia 08/22/2013  . Current tobacco use 08/22/2013  . Accumulation of fluid in tissues 11/29/2012  . Dermatophytic onychia 10/28/2012  . Colon, diverticulosis 10/28/2012  . Polypharmacy 10/28/2012  . DS (disseminated sclerosis) ([place]) 10/28/2012  . Arthralgia of lower leg 10/28/2012  . Menopausal symptom 10/28/2012  . Current tear of lateral cartilage or meniscus of knee 10/28/2012  . Diverticular disease of large intestine 10/28/2012  . Other long term (current) drug therapy 10/28/2012  . Abnormal uterine bleeding 06/26/2012  . Baseball finger 08/03/2010    Orientation RESPIRATION BLADDER Height & Weight     Self,Time,Situation,Place  Normal Incontinent Weight: 81.8 kg Height:  5\' 5"  (165.1 cm)  BEHAVIORAL SYMPTOMS/MOOD NEUROLOGICAL BOWEL NUTRITION STATUS      Incontinent Diet (regular)  AMBULATORY STATUS COMMUNICATION OF NEEDS Skin   Extensive Assist Verbally PU Stage and Appropriate Care       PU  Stage 4 Dressing: BID               Personal Care Assistance Level of Assistance  Bathing,Feeding,Dressing Bathing Assistance:  Limited assistance Feeding assistance: Limited assistance Dressing Assistance: Limited assistance     Functional Limitations Info  Sight,Hearing,Speech Sight Info: Adequate Hearing Info: Adequate Speech Info: Adequate    SPECIAL CARE FACTORS FREQUENCY  PT (By licensed PT),OT (By licensed OT)     PT Frequency: 5 x weekly OT Frequency: 5 x weekly            Contractures Contractures Info: Not present    Additional Factors Info  Code Status Code Status Info: full Allergies Info: nkda           Current Medications (07/07/2020):  This is the current hospital active medication list Current Facility-Administered Medications  Medication Dose Route Frequency Provider Last Rate Last Admin  . (feeding supplement) PROSource Plus liquid 30 mL  30 mL Oral TID WC Armandina Gemma, MD   30 mL at 07/07/20 0901  . acetaminophen (TYLENOL) tablet 650 mg  650 mg Oral Q6H PRN Armandina Gemma, MD   650 mg at 07/03/20 1726   Or  . acetaminophen (TYLENOL) suppository 650 mg  650 mg Rectal Q6H PRN Armandina Gemma, MD      . aspirin EC tablet 81 mg  81 mg Oral Daily Armandina Gemma, MD   81 mg at 07/07/20 0903  . benzonatate (TESSALON) capsule 100 mg  100 mg Oral TID Armandina Gemma, MD   100 mg at 07/07/20 0902  . Chlorhexidine Gluconate Cloth 2 % PADS 6 each  6 each Topical Daily Armandina Gemma, MD   6 each at 07/07/20 [phone]  . dalfampridine TB12 10 mg  10 mg Oral BID Armandina Gemma, MD   10 mg at 07/06/20 2216  . enoxaparin (LOVENOX) injection 40 mg  40 mg Subcutaneous Q24H Armandina Gemma, MD   40 mg at 07/07/20 0901  . ferrous sulfate tablet 325 mg  325 mg Oral BID Armandina Gemma, MD   325 mg at 07/07/20 0903  . fluticasone furoate-vilanterol (BREO ELLIPTA) 100-25 MCG/INH 1 puff  1 puff Inhalation Daily Armandina Gemma, MD   1 puff at 07/06/20 0944  . insulin aspart (novoLOG) injection 0-5 Units  0-5 Units Subcutaneous QHS Armandina Gemma, MD      . insulin aspart (novoLOG) injection 0-9 Units  0-9 Units Subcutaneous TID  WC Armandina Gemma, MD   1 Units at 06/30/20 1743  . liver oil-zinc oxide (DESITIN) 40 % ointment   Topical TID PRN Armandina Gemma, MD      . metoprolol succinate (TOPROL-XL) 24 hr tablet 100 mg  100 mg Oral Daily Armandina Gemma, MD   100 mg at 07/07/20 0903  . multivitamin with minerals tablet 1 tablet  1 tablet Oral Daily Armandina Gemma, MD   1 tablet at 07/07/20 0902  . ondansetron (ZOFRAN-ODT) disintegrating tablet 4 mg  4 mg Oral Q6H PRN Armandina Gemma, MD       Or  . ondansetron (ZOFRAN) injection 4 mg  4 mg Intravenous Q6H PRN Armandina Gemma, MD      . oxybutynin (DITROPAN-XL) 24 hr tablet 10 mg  10 mg Oral Daily Armandina Gemma, MD   10 mg at 07/07/20 0902  . oxyCODONE (Oxy IR/ROXICODONE) immediate release tablet 5-10 mg  5-10 mg Oral Q4H PRN Armandina Gemma, MD   10 mg at 07/07/20 0902  . pantoprazole (PROTONIX) EC tablet 40 mg  40 mg Oral Daily Armandina Gemma, MD   40 mg at 07/07/20 0903  . Plecanatide TABS 1 tablet  1 tablet Oral Daily Armandina Gemma, MD   1 tablet at 07/06/20 1044  . polyethylene glycol (MIRALAX / GLYCOLAX) packet 17 g  17 g Oral Daily PRN Eugenie Filler, MD      . pravastatin (PRAVACHOL) tablet 20 mg  20 mg Oral q1800 Armandina Gemma, MD   20 mg at 07/06/20 1739  . senna-docusate (Senokot-S) tablet 1 tablet  1 tablet Oral BID Eugenie Filler, MD   1 tablet at 07/07/20 0902  . sodium chloride flush (NS) 0.9 % injection 10-40 mL  10-40 mL Intracatheter PRN Armandina Gemma, MD      . sorbitol, milk of mag, mineral oil, glycerin (SMOG) enema  960 mL Rectal Once Eugenie Filler, MD      . tiZANidine (ZANAFLEX) tablet 8 mg  8 mg Oral TID Armandina Gemma, MD   8 mg at 07/07/20 4239     Discharge Medications: Please see discharge summary for a list of discharge medications.  Relevant Imaging Results:  Relevant Lab Results:   Additional Information SS# [id number]; pt has had COVID vaccination but not booster  Leeroy Cha, RN

## 2020-07-07 NOTE — TOC Progression Note (Signed)
Transition of Care [facility]) - Progression Note    Patient Details  Name: Ritaj Dullea MRN: [id number] Date of Birth: 1960-10-12  Transition of Care [facility]) CM/SW Contact  Leeroy Cha, RN Phone Number: 07/07/2020, 9:51 AM  Clinical Narrative:    tct-accoridius loie/messageleft to call back at [phone]   Expected Discharge Plan: [place] (vs home with [facility]) Barriers to Discharge: Continued Medical Work up  Expected Discharge Plan and Services Expected Discharge Plan: [place] (vs home with [facility]) In-house Referral: Clinical Social Work     Living arrangements for the past 2 months: Single Family Home Expected Discharge Date: 07/07/20                                     Social Determinants of Health (SDOH) Interventions    Readmission Risk Interventions Readmission Risk Prevention Plan 06/18/2020  Transportation Screening Complete  PCP or Specialist Appt within 5-7 Days Complete  Home Care Screening Complete  Medication Review (RN CM) Complete  Some recent data might be hidden

## 2020-07-07 NOTE — Progress Notes (Signed)
[facility] picked up patient.

## 2020-07-07 NOTE — Progress Notes (Signed)
[place] to give report. Was on hold for ten minutes. No one picked up the phone. Called back told the receptionist for the nurse to call the Probation officer

## 2020-07-22 ENCOUNTER — Inpatient Hospital Stay: Payer: Medicare Other | Admitting: Infectious Diseases

## 2020-07-24 ENCOUNTER — Inpatient Hospital Stay: Payer: Medicare Other | Admitting: Infectious Diseases

## 2020-08-11 ENCOUNTER — Emergency Department (HOSPITAL_COMMUNITY): Payer: Medicare Other

## 2020-08-11 ENCOUNTER — Other Ambulatory Visit: Payer: Self-pay

## 2020-08-11 ENCOUNTER — Encounter (HOSPITAL_COMMUNITY): Payer: Self-pay

## 2020-08-11 ENCOUNTER — Inpatient Hospital Stay (HOSPITAL_COMMUNITY)
Admission: EM | Admit: 2020-08-11 | Discharge: 2020-09-02 | DRG: 871 | Disposition: A | Discharge status: Skilled Nursing Facility | Payer: Medicare Other | Attending: Internal Medicine | Admitting: Internal Medicine

## 2020-08-11 DIAGNOSIS — K5641 Fecal impaction: Secondary | ICD-10-CM | POA: Diagnosis present

## 2020-08-11 DIAGNOSIS — L89154 Pressure ulcer of sacral region, stage 4: Secondary | ICD-10-CM | POA: Diagnosis present

## 2020-08-11 DIAGNOSIS — E1122 Type 2 diabetes mellitus with diabetic chronic kidney disease: Secondary | ICD-10-CM | POA: Diagnosis present

## 2020-08-11 DIAGNOSIS — I69354 Hemiplegia and hemiparesis following cerebral infarction affecting left non-dominant side: Secondary | ICD-10-CM

## 2020-08-11 DIAGNOSIS — D573 Sickle-cell trait: Secondary | ICD-10-CM | POA: Diagnosis present

## 2020-08-11 DIAGNOSIS — Z20822 Contact with and (suspected) exposure to covid-19: Secondary | ICD-10-CM | POA: Diagnosis present

## 2020-08-11 DIAGNOSIS — R5081 Fever presenting with conditions classified elsewhere: Secondary | ICD-10-CM | POA: Diagnosis not present

## 2020-08-11 DIAGNOSIS — Z515 Encounter for palliative care: Secondary | ICD-10-CM | POA: Diagnosis not present

## 2020-08-11 DIAGNOSIS — D500 Iron deficiency anemia secondary to blood loss (chronic): Secondary | ICD-10-CM | POA: Diagnosis present

## 2020-08-11 DIAGNOSIS — Z8616 Personal history of COVID-19: Secondary | ICD-10-CM

## 2020-08-11 DIAGNOSIS — D72821 Monocytosis (symptomatic): Secondary | ICD-10-CM | POA: Diagnosis present

## 2020-08-11 DIAGNOSIS — I452 Bifascicular block: Secondary | ICD-10-CM | POA: Diagnosis present

## 2020-08-11 DIAGNOSIS — J984 Other disorders of lung: Secondary | ICD-10-CM | POA: Diagnosis not present

## 2020-08-11 DIAGNOSIS — N189 Chronic kidney disease, unspecified: Secondary | ICD-10-CM | POA: Diagnosis present

## 2020-08-11 DIAGNOSIS — K921 Melena: Secondary | ICD-10-CM | POA: Diagnosis present

## 2020-08-11 DIAGNOSIS — Z7189 Other specified counseling: Secondary | ICD-10-CM | POA: Diagnosis not present

## 2020-08-11 DIAGNOSIS — Z8701 Personal history of pneumonia (recurrent): Secondary | ICD-10-CM | POA: Diagnosis not present

## 2020-08-11 DIAGNOSIS — G35 Multiple sclerosis: Secondary | ICD-10-CM | POA: Diagnosis present

## 2020-08-11 DIAGNOSIS — K5909 Other constipation: Secondary | ICD-10-CM | POA: Diagnosis not present

## 2020-08-11 DIAGNOSIS — I129 Hypertensive chronic kidney disease with stage 1 through stage 4 chronic kidney disease, or unspecified chronic kidney disease: Secondary | ICD-10-CM | POA: Diagnosis present

## 2020-08-11 DIAGNOSIS — T7601XA Adult neglect or abandonment, suspected, initial encounter: Secondary | ICD-10-CM | POA: Diagnosis present

## 2020-08-11 DIAGNOSIS — M4628 Osteomyelitis of vertebra, sacral and sacrococcygeal region: Secondary | ICD-10-CM | POA: Diagnosis not present

## 2020-08-11 DIAGNOSIS — K5649 Other impaction of intestine: Secondary | ICD-10-CM | POA: Diagnosis present

## 2020-08-11 DIAGNOSIS — D72829 Elevated white blood cell count, unspecified: Secondary | ICD-10-CM | POA: Diagnosis not present

## 2020-08-11 DIAGNOSIS — Z6826 Body mass index (BMI) 26.0-26.9, adult: Secondary | ICD-10-CM | POA: Diagnosis not present

## 2020-08-11 DIAGNOSIS — G259 Extrapyramidal and movement disorder, unspecified: Secondary | ICD-10-CM | POA: Diagnosis present

## 2020-08-11 DIAGNOSIS — R4182 Altered mental status, unspecified: Secondary | ICD-10-CM | POA: Diagnosis present

## 2020-08-11 DIAGNOSIS — R5381 Other malaise: Secondary | ICD-10-CM | POA: Diagnosis present

## 2020-08-11 DIAGNOSIS — K581 Irritable bowel syndrome with constipation: Secondary | ICD-10-CM | POA: Diagnosis present

## 2020-08-11 DIAGNOSIS — R4189 Other symptoms and signs involving cognitive functions and awareness: Secondary | ICD-10-CM | POA: Diagnosis present

## 2020-08-11 DIAGNOSIS — Z79899 Other long term (current) drug therapy: Secondary | ICD-10-CM

## 2020-08-11 DIAGNOSIS — K5900 Constipation, unspecified: Secondary | ICD-10-CM | POA: Diagnosis present

## 2020-08-11 DIAGNOSIS — K219 Gastro-esophageal reflux disease without esophagitis: Secondary | ICD-10-CM | POA: Diagnosis present

## 2020-08-11 DIAGNOSIS — X58XXXA Exposure to other specified factors, initial encounter: Secondary | ICD-10-CM | POA: Diagnosis present

## 2020-08-11 DIAGNOSIS — Z7951 Long term (current) use of inhaled steroids: Secondary | ICD-10-CM

## 2020-08-11 DIAGNOSIS — D75838 Other thrombocytosis: Secondary | ICD-10-CM | POA: Diagnosis present

## 2020-08-11 DIAGNOSIS — Z79891 Long term (current) use of opiate analgesic: Secondary | ICD-10-CM

## 2020-08-11 DIAGNOSIS — D75839 Thrombocytosis, unspecified: Secondary | ICD-10-CM | POA: Diagnosis present

## 2020-08-11 DIAGNOSIS — Z720 Tobacco use: Secondary | ICD-10-CM | POA: Diagnosis present

## 2020-08-11 DIAGNOSIS — F17210 Nicotine dependence, cigarettes, uncomplicated: Secondary | ICD-10-CM | POA: Diagnosis present

## 2020-08-11 DIAGNOSIS — I10 Essential (primary) hypertension: Secondary | ICD-10-CM | POA: Diagnosis present

## 2020-08-11 DIAGNOSIS — D649 Anemia, unspecified: Secondary | ICD-10-CM | POA: Diagnosis not present

## 2020-08-11 DIAGNOSIS — Z87440 Personal history of urinary (tract) infections: Secondary | ICD-10-CM

## 2020-08-11 DIAGNOSIS — M7989 Other specified soft tissue disorders: Secondary | ICD-10-CM | POA: Diagnosis not present

## 2020-08-11 DIAGNOSIS — Z539 Procedure and treatment not carried out, unspecified reason: Secondary | ICD-10-CM | POA: Diagnosis not present

## 2020-08-11 DIAGNOSIS — E876 Hypokalemia: Secondary | ICD-10-CM | POA: Diagnosis present

## 2020-08-11 DIAGNOSIS — R532 Functional quadriplegia: Secondary | ICD-10-CM | POA: Diagnosis present

## 2020-08-11 DIAGNOSIS — R627 Adult failure to thrive: Secondary | ICD-10-CM | POA: Diagnosis present

## 2020-08-11 DIAGNOSIS — E1121 Type 2 diabetes mellitus with diabetic nephropathy: Secondary | ICD-10-CM | POA: Diagnosis not present

## 2020-08-11 DIAGNOSIS — G9341 Metabolic encephalopathy: Secondary | ICD-10-CM | POA: Diagnosis present

## 2020-08-11 DIAGNOSIS — R195 Other fecal abnormalities: Secondary | ICD-10-CM | POA: Diagnosis present

## 2020-08-11 DIAGNOSIS — R000 Tachycardia, unspecified: Secondary | ICD-10-CM | POA: Diagnosis not present

## 2020-08-11 DIAGNOSIS — E119 Type 2 diabetes mellitus without complications: Secondary | ICD-10-CM | POA: Diagnosis not present

## 2020-08-11 DIAGNOSIS — E1169 Type 2 diabetes mellitus with other specified complication: Secondary | ICD-10-CM | POA: Diagnosis present

## 2020-08-11 DIAGNOSIS — R600 Localized edema: Secondary | ICD-10-CM | POA: Diagnosis present

## 2020-08-11 DIAGNOSIS — L089 Local infection of the skin and subcutaneous tissue, unspecified: Secondary | ICD-10-CM | POA: Diagnosis present

## 2020-08-11 DIAGNOSIS — E042 Nontoxic multinodular goiter: Secondary | ICD-10-CM | POA: Diagnosis present

## 2020-08-11 DIAGNOSIS — R509 Fever, unspecified: Secondary | ICD-10-CM | POA: Diagnosis not present

## 2020-08-11 DIAGNOSIS — J439 Emphysema, unspecified: Secondary | ICD-10-CM | POA: Diagnosis present

## 2020-08-11 DIAGNOSIS — L89150 Pressure ulcer of sacral region, unstageable: Secondary | ICD-10-CM | POA: Diagnosis not present

## 2020-08-11 DIAGNOSIS — A419 Sepsis, unspecified organism: Secondary | ICD-10-CM | POA: Diagnosis not present

## 2020-08-11 DIAGNOSIS — L8990 Pressure ulcer of unspecified site, unspecified stage: Secondary | ICD-10-CM | POA: Diagnosis present

## 2020-08-11 DIAGNOSIS — Z833 Family history of diabetes mellitus: Secondary | ICD-10-CM

## 2020-08-11 DIAGNOSIS — E079 Disorder of thyroid, unspecified: Secondary | ICD-10-CM | POA: Diagnosis not present

## 2020-08-11 DIAGNOSIS — Z7401 Bed confinement status: Secondary | ICD-10-CM

## 2020-08-11 DIAGNOSIS — Z7982 Long term (current) use of aspirin: Secondary | ICD-10-CM

## 2020-08-11 DIAGNOSIS — R531 Weakness: Secondary | ICD-10-CM | POA: Diagnosis not present

## 2020-08-11 LAB — CBC WITH DIFFERENTIAL/PLATELET
Abs Immature Granulocytes: 0.38 10*3/uL — ABNORMAL HIGH (ref 0.00–0.07)
Basophils Absolute: 0.1 10*3/uL (ref 0.0–0.1)
Basophils Relative: 1 %
Eosinophils Absolute: 0.1 10*3/uL (ref 0.0–0.5)
Eosinophils Relative: 0 %
HCT: 28.2 % — ABNORMAL LOW (ref 36.0–46.0)
Hemoglobin: 9 g/dL — ABNORMAL LOW (ref 12.0–15.0)
Immature Granulocytes: 2 %
Lymphocytes Relative: 9 %
Lymphs Abs: 1.9 10*3/uL (ref 0.7–4.0)
MCH: 25.2 pg — ABNORMAL LOW (ref 26.0–34.0)
MCHC: 31.9 g/dL (ref 30.0–36.0)
MCV: 79 fL — ABNORMAL LOW (ref 80.0–100.0)
Monocytes Absolute: 1.4 10*3/uL — ABNORMAL HIGH (ref 0.1–1.0)
Monocytes Relative: 7 %
Neutro Abs: 16.2 10*3/uL — ABNORMAL HIGH (ref 1.7–7.7)
Neutrophils Relative %: 81 %
Platelets: 773 10*3/uL — ABNORMAL HIGH (ref 150–400)
RBC: 3.57 MIL/uL — ABNORMAL LOW (ref 3.87–5.11)
RDW: 19.1 % — ABNORMAL HIGH (ref 11.5–15.5)
WBC: 20 10*3/uL — ABNORMAL HIGH (ref 4.0–10.5)
nRBC: 0.1 % (ref 0.0–0.2)

## 2020-08-11 LAB — COMPREHENSIVE METABOLIC PANEL
ALT: 29 U/L (ref 0–44)
AST: 17 U/L (ref 15–41)
Albumin: 2 g/dL — ABNORMAL LOW (ref 3.5–5.0)
Alkaline Phosphatase: 119 U/L (ref 38–126)
Anion gap: 10 (ref 5–15)
BUN: 6 mg/dL (ref 6–20)
CO2: 24 mmol/L (ref 22–32)
Calcium: 9.3 mg/dL (ref 8.9–10.3)
Chloride: 99 mmol/L (ref 98–111)
Creatinine, Ser: 0.52 mg/dL (ref 0.44–1.00)
GFR, Estimated: >60 mL/min (ref >60)
Glucose, Bld: 119 mg/dL — ABNORMAL HIGH (ref 70–99)
Potassium: 4.2 mmol/L (ref 3.5–5.1)
Sodium: 133 mmol/L — ABNORMAL LOW (ref 135–145)
Total Bilirubin: 0.5 mg/dL (ref 0.3–1.2)
Total Protein: 5.5 g/dL — ABNORMAL LOW (ref 6.5–8.1)

## 2020-08-11 LAB — CBG MONITORING, ED: Glucose-Capillary: 97 mg/dL (ref 70–99)

## 2020-08-11 LAB — URINALYSIS, ROUTINE W REFLEX MICROSCOPIC
Bilirubin Urine: NEGATIVE
Glucose, UA: NEGATIVE mg/dL
Hgb urine dipstick: NEGATIVE
Ketones, ur: NEGATIVE mg/dL
Leukocytes,Ua: NEGATIVE
Nitrite: NEGATIVE
Protein, ur: NEGATIVE mg/dL
Specific Gravity, Urine: 1.005 (ref 1.005–1.030)
pH: 6 (ref 5.0–8.0)

## 2020-08-11 LAB — PROTIME-INR
INR: 1.1 (ref 0.8–1.2)
Prothrombin Time: 13.9 seconds (ref 11.4–15.2)

## 2020-08-11 LAB — LACTIC ACID, PLASMA: Lactic Acid, Venous: 1.5 mmol/L (ref 0.5–1.9)

## 2020-08-11 LAB — RESP PANEL BY RT-PCR (FLU A&B, COVID) ARPGX2
Influenza A by PCR: NEGATIVE
Influenza B by PCR: NEGATIVE
SARS Coronavirus 2 by RT PCR: NEGATIVE

## 2020-08-11 LAB — APTT: aPTT: 38 seconds — ABNORMAL HIGH (ref 24–36)

## 2020-08-11 MED ORDER — ACETAMINOPHEN 650 MG RE SUPP: 650.0000 mg | Freq: Four times a day (QID) | RECTAL | Status: DC | PRN

## 2020-08-11 MED ORDER — ACETAMINOPHEN 325 MG PO TABS
650.0000 mg | ORAL_TABLET | Freq: Once | ORAL | Status: AC
  Administered 2020-08-11: 650 mg via ORAL
  Filled 2020-08-11: qty 2

## 2020-08-11 MED ORDER — VANCOMYCIN HCL 1500 MG/300ML IV SOLN
1500.0000 mg | Freq: Once | INTRAVENOUS | Status: AC
  Administered 2020-08-11: 1500 mg via INTRAVENOUS
  Filled 2020-08-11: qty 300

## 2020-08-11 MED ORDER — INSULIN ASPART 100 UNIT/ML ~~LOC~~ SOLN: 0.0000 [IU] | Freq: Every day | SUBCUTANEOUS | Status: DC

## 2020-08-11 MED ORDER — METRONIDAZOLE IN NACL 5-0.79 MG/ML-% IV SOLN
500.0000 mg | Freq: Three times a day (TID) | INTRAVENOUS | Status: DC
  Administered 2020-08-11 – 2020-08-14 (×9): 500 mg via INTRAVENOUS
  Filled 2020-08-11 (×9): qty 100

## 2020-08-11 MED ORDER — SODIUM CHLORIDE 0.9 % IV SOLN: INTRAVENOUS | Status: DC

## 2020-08-11 MED ORDER — SODIUM CHLORIDE 0.9 % IV SOLN
2.0000 g | Freq: Three times a day (TID) | INTRAVENOUS | Status: DC
  Administered 2020-08-11 – 2020-08-14 (×9): 2 g via INTRAVENOUS
  Filled 2020-08-11 (×10): qty 2

## 2020-08-11 MED ORDER — POLYETHYLENE GLYCOL 3350 17 G PO PACK
17.0000 g | PACK | Freq: Every day | ORAL | Status: DC
  Administered 2020-08-13: 17 g via ORAL
  Filled 2020-08-11: qty 1

## 2020-08-11 MED ORDER — SODIUM CHLORIDE 0.9 % IV BOLUS
1000.0000 mL | Freq: Once | INTRAVENOUS | Status: AC
  Administered 2020-08-11: 1000 mL via INTRAVENOUS

## 2020-08-11 MED ORDER — LACTATED RINGERS IV BOLUS (SEPSIS)
1000.0000 mL | Freq: Once | INTRAVENOUS | Status: AC
  Administered 2020-08-11: 1000 mL via INTRAVENOUS

## 2020-08-11 MED ORDER — FENTANYL CITRATE (PF) 100 MCG/2ML IJ SOLN
12.5000 ug | Freq: Once | INTRAMUSCULAR | Status: AC
  Administered 2020-08-11: 12.5 ug via INTRAVENOUS
  Filled 2020-08-11: qty 2

## 2020-08-11 MED ORDER — SENNOSIDES-DOCUSATE SODIUM 8.6-50 MG PO TABS
1.0000 | ORAL_TABLET | Freq: Every day | ORAL | Status: DC
  Administered 2020-08-11 – 2020-08-19 (×8): 1 via ORAL
  Filled 2020-08-11 (×9): qty 1

## 2020-08-11 MED ORDER — NICOTINE 21 MG/24HR TD PT24
21.0000 mg | MEDICATED_PATCH | Freq: Every day | TRANSDERMAL | Status: DC
  Administered 2020-08-13 – 2020-08-17 (×4): 21 mg via TRANSDERMAL
  Filled 2020-08-11 (×11): qty 1

## 2020-08-11 MED ORDER — INSULIN ASPART 100 UNIT/ML ~~LOC~~ SOLN
0.0000 [IU] | Freq: Three times a day (TID) | SUBCUTANEOUS | Status: DC
  Administered 2020-08-13 – 2020-08-17 (×3): 1 [IU] via SUBCUTANEOUS
  Administered 2020-08-17: 2 [IU] via SUBCUTANEOUS
  Administered 2020-08-21: 1 [IU] via SUBCUTANEOUS
  Administered 2020-08-23: 2 [IU] via SUBCUTANEOUS

## 2020-08-11 MED ORDER — FENTANYL CITRATE (PF) 100 MCG/2ML IJ SOLN: 25.0000 ug | Freq: Once | INTRAMUSCULAR | Status: DC

## 2020-08-11 MED ORDER — IOHEXOL 300 MG/ML  SOLN
100.0000 mL | Freq: Once | INTRAMUSCULAR | Status: AC | PRN
  Administered 2020-08-11: 100 mL via INTRAVENOUS

## 2020-08-11 MED ORDER — METRONIDAZOLE IN NACL 5-0.79 MG/ML-% IV SOLN
500.0000 mg | Freq: Once | INTRAVENOUS | Status: AC
  Administered 2020-08-11: 500 mg via INTRAVENOUS
  Filled 2020-08-11: qty 100

## 2020-08-11 MED ORDER — SODIUM CHLORIDE 0.9 % IV SOLN
2.0000 g | Freq: Once | INTRAVENOUS | Status: AC
  Administered 2020-08-11: 2 g via INTRAVENOUS
  Filled 2020-08-11: qty 2

## 2020-08-11 MED ORDER — ENOXAPARIN SODIUM 40 MG/0.4ML ~~LOC~~ SOLN
40.0000 mg | SUBCUTANEOUS | Status: DC
  Administered 2020-08-12: 40 mg via SUBCUTANEOUS
  Filled 2020-08-11: qty 0.4

## 2020-08-11 MED ORDER — VANCOMYCIN HCL 1250 MG/250ML IV SOLN
1250.0000 mg | INTRAVENOUS | Status: DC
  Administered 2020-08-12 – 2020-08-14 (×3): 1250 mg via INTRAVENOUS
  Filled 2020-08-11 (×4): qty 250

## 2020-08-11 MED ORDER — ACETAMINOPHEN 325 MG PO TABS
650.0000 mg | ORAL_TABLET | Freq: Four times a day (QID) | ORAL | Status: DC | PRN
  Administered 2020-08-11 – 2020-08-21 (×8): 650 mg via ORAL
  Filled 2020-08-11 (×8): qty 2

## 2020-08-11 NOTE — ED Notes (Signed)
Patient provided applesauce and ginger ale, required assistance with eating/dringing. Tolerated food and fluids well.

## 2020-08-11 NOTE — ED Notes (Signed)
Pt was given peri care d/t a BM, straight cath for urine specimen & repositioned off of her sacral wound.

## 2020-08-11 NOTE — Progress Notes (Signed)
Pharmacy Antibiotic Note  Tina Griffith is a 60 y.o. female admitted on 08/11/2020 with sepsis of unknown source. In 05/2020 UCx grew E. Coli (amp, amp/sulb, cipro and bactrim resistant) and MRSA PCR was positive. Pt now presents febrile, tachycardic and tachypneic with a malodorous sacral wound. Admission labs significant for WBC 20.0 and Scr 0.52 (BL ~0.5-0.6). Pharmacy has been consulted for vancomycin and cefepime dosing.  Plan: Vancomycin 1500 mg IV x1 loading dose Vancomycin 1250 mg IV q24h (Scr rounded to 0.8, Vd 0.5, AUC 512, Goal 400-550) Cefepime 2g IV q8h Flagyl per MD Monitor renal function, cultures, clinical progression and ability to de-escalate therapy.    Temp (24hrs), Avg:101.1 F (38.4 C), Min:101.1 F (38.4 C), Max:101.1 F (38.4 C)  Recent Labs  Lab 08/11/20 1233 08/11/20 1314  WBC 20.0*  --   CREATININE 0.52  --   LATICACIDVEN  --  1.5    CrCl cannot be calculated (Unknown ideal weight.).    Allergies  Allergen Reactions  . No Known Allergies     Antimicrobials this admission: Vancomycin 3/21 >>  Cefepime 3/21 >>  Flagym 3/21 >>  Dose adjustments this admission: N/A  Microbiology results: 3/21 BCx x2: pending 3/21 UCx: pending  3/21 Resp Panel: pending  Thank you for allowing pharmacy to be a part of this patient's care.  Jacobo Forest PharmD Candidate 2022 08/11/2020 12:49 PM
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
## 2020-08-12 ENCOUNTER — Other Ambulatory Visit: Payer: Self-pay

## 2020-08-12 ENCOUNTER — Inpatient Hospital Stay (HOSPITAL_COMMUNITY): Payer: Medicare Other

## 2020-08-12 DIAGNOSIS — M7989 Other specified soft tissue disorders: Secondary | ICD-10-CM | POA: Diagnosis not present

## 2020-08-12 DIAGNOSIS — L89154 Pressure ulcer of sacral region, stage 4: Secondary | ICD-10-CM

## 2020-08-12 DIAGNOSIS — K5909 Other constipation: Secondary | ICD-10-CM

## 2020-08-12 DIAGNOSIS — R4182 Altered mental status, unspecified: Secondary | ICD-10-CM

## 2020-08-12 DIAGNOSIS — Z720 Tobacco use: Secondary | ICD-10-CM | POA: Diagnosis not present

## 2020-08-12 DIAGNOSIS — A419 Sepsis, unspecified organism: Secondary | ICD-10-CM | POA: Diagnosis not present

## 2020-08-12 DIAGNOSIS — D72829 Elevated white blood cell count, unspecified: Secondary | ICD-10-CM

## 2020-08-12 DIAGNOSIS — M4628 Osteomyelitis of vertebra, sacral and sacrococcygeal region: Secondary | ICD-10-CM | POA: Diagnosis not present

## 2020-08-12 DIAGNOSIS — R195 Other fecal abnormalities: Secondary | ICD-10-CM | POA: Diagnosis not present

## 2020-08-12 DIAGNOSIS — K5900 Constipation, unspecified: Secondary | ICD-10-CM

## 2020-08-12 DIAGNOSIS — E119 Type 2 diabetes mellitus without complications: Secondary | ICD-10-CM

## 2020-08-12 DIAGNOSIS — D649 Anemia, unspecified: Secondary | ICD-10-CM

## 2020-08-12 DIAGNOSIS — K5641 Fecal impaction: Secondary | ICD-10-CM | POA: Diagnosis not present

## 2020-08-12 LAB — BASIC METABOLIC PANEL
Anion gap: 11 (ref 5–15)
BUN: 7 mg/dL (ref 6–20)
CO2: 19 mmol/L — ABNORMAL LOW (ref 22–32)
Calcium: 9 mg/dL (ref 8.9–10.3)
Chloride: 106 mmol/L (ref 98–111)
Creatinine, Ser: 0.44 mg/dL (ref 0.44–1.00)
GFR, Estimated: >60 mL/min (ref >60)
Glucose, Bld: 96 mg/dL (ref 70–99)
Potassium: 3.4 mmol/L — ABNORMAL LOW (ref 3.5–5.1)
Sodium: 136 mmol/L (ref 135–145)

## 2020-08-12 LAB — CBC
HCT: 30 % — ABNORMAL LOW (ref 36.0–46.0)
Hemoglobin: 9.4 g/dL — ABNORMAL LOW (ref 12.0–15.0)
MCH: 25.5 pg — ABNORMAL LOW (ref 26.0–34.0)
MCHC: 31.3 g/dL (ref 30.0–36.0)
MCV: 81.3 fL (ref 80.0–100.0)
Platelets: 717 10*3/uL — ABNORMAL HIGH (ref 150–400)
RBC: 3.69 MIL/uL — ABNORMAL LOW (ref 3.87–5.11)
RDW: 19.9 % — ABNORMAL HIGH (ref 11.5–15.5)
WBC: 15.9 10*3/uL — ABNORMAL HIGH (ref 4.0–10.5)
nRBC: 0 % (ref 0.0–0.2)

## 2020-08-12 LAB — CBG MONITORING, ED: Glucose-Capillary: 103 mg/dL — ABNORMAL HIGH (ref 70–99)

## 2020-08-12 LAB — GLUCOSE, CAPILLARY
Glucose-Capillary: 100 mg/dL — ABNORMAL HIGH (ref 70–99)
Glucose-Capillary: 92 mg/dL (ref 70–99)
Glucose-Capillary: 182 mg/dL — ABNORMAL HIGH (ref 70–99)

## 2020-08-12 LAB — HEMOGLOBIN AND HEMATOCRIT, BLOOD
HCT: 27.3 % — ABNORMAL LOW (ref 36.0–46.0)
Hemoglobin: 8.7 g/dL — ABNORMAL LOW (ref 12.0–15.0)

## 2020-08-12 LAB — OCCULT BLOOD X 1 CARD TO LAB, STOOL: Fecal Occult Bld: POSITIVE — AB

## 2020-08-12 LAB — MRSA PCR SCREENING: MRSA by PCR: NEGATIVE

## 2020-08-12 MED ORDER — SORBITOL 70 % SOLN
960.0000 mL | TOPICAL_OIL | Freq: Two times a day (BID) | ORAL | Status: DC
  Administered 2020-08-12 – 2020-08-14 (×5): 960 mL via RECTAL
  Filled 2020-08-12 (×13): qty 473

## 2020-08-12 MED ORDER — HYDROCODONE-ACETAMINOPHEN 5-325 MG PO TABS
1.0000 | ORAL_TABLET | Freq: Four times a day (QID) | ORAL | Status: DC | PRN
  Administered 2020-08-12 – 2020-08-14 (×5): 2 via ORAL
  Administered 2020-08-15: 1 via ORAL
  Administered 2020-08-16: 2 via ORAL
  Administered 2020-08-16: 1 via ORAL
  Administered 2020-08-17: 2 via ORAL
  Administered 2020-08-17: 1 via ORAL
  Filled 2020-08-12: qty 2
  Filled 2020-08-12 (×2): qty 1
  Filled 2020-08-12 (×6): qty 2
  Filled 2020-08-12: qty 1

## 2020-08-12 MED ORDER — METOPROLOL SUCCINATE ER 100 MG PO TB24
100.0000 mg | ORAL_TABLET | Freq: Every day | ORAL | Status: DC
  Administered 2020-08-12 – 2020-08-23 (×11): 100 mg via ORAL
  Filled 2020-08-12 (×3): qty 1
  Filled 2020-08-12: qty 2
  Filled 2020-08-12: qty 1
  Filled 2020-08-12: qty 2
  Filled 2020-08-12 (×5): qty 1

## 2020-08-12 MED ORDER — PANTOPRAZOLE SODIUM 40 MG IV SOLR
40.0000 mg | Freq: Two times a day (BID) | INTRAVENOUS | Status: DC
  Administered 2020-08-12 – 2020-08-20 (×15): 40 mg via INTRAVENOUS
  Filled 2020-08-12 (×16): qty 40

## 2020-08-12 NOTE — Plan of Care (Signed)
  Problem: Education: Goal: Knowledge of General Education information will improve Description: Including pain rating scale, medication(s)/side effects and non-pharmacologic comfort measures Outcome: Progressing   Problem: Clinical Measurements: Goal: Ability to maintain clinical measurements within normal limits will improve Outcome: Progressing Goal: Will remain free from infection Outcome: Progressing   Problem: Activity: Goal: Risk for activity intolerance will decrease Outcome: Progressing   Problem: Nutrition: Goal: Adequate nutrition will be maintained Outcome: Progressing   Problem: Pain Managment: Goal: General experience of comfort will improve Outcome: Progressing   Problem: Safety: Goal: Ability to remain free from injury will improve Outcome: Progressing   Problem: Skin Integrity: Goal: Risk for impaired skin integrity will decrease Outcome: Progressing   

## 2020-08-12 NOTE — ED Notes (Signed)
[facility] called at [phone].

## 2020-08-12 NOTE — ED Notes (Signed)
Attempted to call report at [phone] to receiving RN at [facility].

## 2020-08-12 NOTE — Consult Note (Signed)
[facility]  Total days of antibiotics 2               Reason for Consult: sacral osteomyelitis    Referring Physician: nettey  Principal Problem:   Sepsis ([place]) Active Problems:   Constipation   Tobacco abuse   Anemia   AMS (altered mental status)   Diabetes ([place])   Fecal impaction (HCC)   Dark stools    HPI: Tina Griffith is a 60 y.o. female who is debilitated by severe MS who is bed bound, diffuse weakness, needs assistance with ADLs. She had recently been hospitalized for COVID-19 in [place] and developed decubitus ulcer that has progressed to stage IV ulcer. She was hospitalized for in early February for sepsis thought to be due to sacral ulcer s/p debridement at end of January. She was treated initially with augmentin/doxy and piptazo for what appears a total of 2 wk. She did have 1/2 blood cx for bacteroids ovatus. She was discharged on 2/14 with no further abtx but to continue with wound care at [facility]. The patient was readmitted on 3/21 due to concern brought up by family that sacral wound is worsening, often found she was soiled, also not fed per ED note. The patient denies fever, but does feel that she has some increased pain to buttock. The sacral wound, with palpable bone, large but has good granulation tissue. Wound care to do evaluation if ready for wound vac. CT suggests chronic osteo of pelvis. Due to fevers, and tachycardia, she was started on empiric abtx of vancomycin and cefepime or oral metronidazole. Labs showed leukocytosis of 20K with left shift. Some anemia of hgb 9.0. during the stay she is having frequent tarry stool. She is also being seen by gi for concern for gib.   Past Medical History:  Diagnosis Date  . Constipation   . Hypertension   . Movement disorder   . MS (multiple sclerosis) ([place])   . Vision abnormalities     Allergies:  Allergies  Allergen Reactions  . No Known Allergies     MEDICATIONS: . insulin aspart  0-5  Units Subcutaneous QHS  . insulin aspart  0-9 Units Subcutaneous TID WC  . metoprolol succinate  100 mg Oral Daily  . nicotine  21 mg Transdermal Daily  . pantoprazole (PROTONIX) IV  40 mg Intravenous Q12H  . polyethylene glycol  17 g Oral Daily  . senna-docusate  1 tablet Oral QHS  . sorbitol, milk of mag, mineral oil, glycerin (SMOG) enema  960 mL Rectal BID    Social History   Tobacco Use  . Smoking status: Current Every Day Smoker    Packs/day: 1.00    Types: Cigarettes  . Smokeless tobacco: Never Used  Substance Use Topics  . Alcohol use: No    Alcohol/week: 0.0 standard drinks  . Drug use: No    Family History  Problem Relation Age of Onset  . Lung cancer Mother   . Stroke Mother   . Diabetes Father      Review of Systems  Constitutional: Negative for fever, chills, diaphoresis, activity change, appetite change, fatigue and unexpected weight change.  HENT: Negative for congestion, sore throat, rhinorrhea, sneezing, trouble swallowing and sinus pressure.  Eyes: Negative for photophobia and visual disturbance.  Respiratory: Negative for cough, chest tightness, shortness of breath, wheezing and stridor.  Cardiovascular: Negative for chest pain, palpitations and leg swelling.  Gastrointestinal: Negative for nausea, vomiting, abdominal pain, diarrhea, constipation,  blood in stool, abdominal distention and anal bleeding.  Genitourinary: Negative for dysuria, hematuria, flank pain and difficulty urinating.  Musculoskeletal: + weakness. Negative for myalgias, back pain, joint swelling, arthralgias and gait problem.  Skin: + large sacral wound. Negative for color change, pallor, rash and wound.  Neurological: Negative for dizziness, tremors, weakness and light-headedness.  Hematological: Negative for adenopathy. Does not bruise/bleed easily.  Psychiatric/Behavioral: Negative for behavioral problems, confusion, sleep disturbance, dysphoric mood, decreased concentration and  agitation.     OBJECTIVE: Temp:  [97.7 F (36.5 C)-98.8 F (37.1 C)] 98.4 F (36.9 C) (03/22 1417) Pulse Rate:  [106-116] 108 (03/22 1417) Resp:  [15-23] 16 (03/22 1417) BP: (120-156)/(73-106) 142/73 (03/22 1417) SpO2:  [95 %-100 %] 100 % (03/22 1417) Physical Exam  Constitutional:  oriented to person, place, and time. appears well-developed and well-nourished. No distress.  HENT: [place]/AT, PERRLA, no scleral icterus Mouth/Throat: Oropharynx is clear but dry. No oropharyngeal exudate.  Cardiovascular: Normal rate, regular rhythm and normal heart sounds. Exam reveals no gallop and no friction rub.  No murmur heard.  Pulmonary/Chest: Effort normal and breath sounds normal. No respiratory distress.  has no wheezes.  Neck = supple, no nuchal rigidity Abdominal: Soft. Bowel sounds are normal.  exhibits no distension. There is no tenderness.  Lymphadenopathy: no cervical adenopathy. No axillary adenopathy Neurological: alert and oriented to person, place, and time. Limited motion with arms, proximal weakness. Diffuse lower extremity weakness- at baseline Skin: Skin is warm and dry. Large sacral decub ulcer stage 4, good granulation bed but more undermining to left side Psychiatric: a normal mood and affect.  behavior is normal.    LABS: Results for orders placed or performed during the hospital encounter of 08/11/20 (from the past 48 hour(s))  Comprehensive metabolic panel     Status: Abnormal   Collection Time: 08/11/20 12:33 PM  Result Value Ref Range   Sodium 133 (L) 135 - 145 mmol/L   Potassium 4.2 3.5 - 5.1 mmol/L   Chloride 99 98 - 111 mmol/L   CO2 24 22 - 32 mmol/L   Glucose, Bld 119 (H) 70 - 99 mg/dL    Comment: Glucose reference range applies only to samples taken after fasting for at least 8 hours.   BUN 6 6 - 20 mg/dL   Creatinine, Ser 0.52 0.44 - 1.00 mg/dL   Calcium 9.3 8.9 - 10.3 mg/dL   Total Protein 5.5 (L) 6.5 - 8.1 g/dL   Albumin 2.0 (L) 3.5 - 5.0 g/dL   AST 17 15  - 41 U/L   ALT 29 0 - 44 U/L   Alkaline Phosphatase 119 38 - 126 U/L   Total Bilirubin 0.5 0.3 - 1.2 mg/dL   GFR, Estimated >60 >60 mL/min    Comment: (NOTE) Calculated using the CKD-EPI Creatinine Equation (2021)    Anion gap 10 5 - 15    Comment: Performed at [facility], [place] [address], [place], [place] [address]  CBC WITH DIFFERENTIAL     Status: Abnormal   Collection Time: 08/11/20 12:33 PM  Result Value Ref Range   WBC 20.0 (H) 4.0 - 10.5 K/uL   RBC 3.57 (L) 3.87 - 5.11 MIL/uL   Hemoglobin 9.0 (L) 12.0 - 15.0 g/dL   HCT 28.2 (L) 36.0 - 46.0 %   MCV 79.0 (L) 80.0 - 100.0 fL   MCH 25.2 (L) 26.0 - 34.0 pg   MCHC 31.9 30.0 - 36.0 g/dL   RDW 19.1 (H) 11.5 -  15.5 %   Platelets 773 (H) 150 - 400 K/uL   nRBC 0.1 0.0 - 0.2 %   Neutrophils Relative % 81 %   Neutro Abs 16.2 (H) 1.7 - 7.7 K/uL   Lymphocytes Relative 9 %   Lymphs Abs 1.9 0.7 - 4.0 K/uL   Monocytes Relative 7 %   Monocytes Absolute 1.4 (H) 0.1 - 1.0 K/uL   Eosinophils Relative 0 %   Eosinophils Absolute 0.1 0.0 - 0.5 K/uL   Basophils Relative 1 %   Basophils Absolute 0.1 0.0 - 0.1 K/uL   Immature Granulocytes 2 %   Abs Immature Granulocytes 0.38 (H) 0.00 - 0.07 K/uL    Comment: Performed at [facility] [address] [place], [place] [address]  Protime-INR     Status: None   Collection Time: 08/11/20 12:33 PM  Result Value Ref Range   Prothrombin Time 13.9 11.4 - 15.2 seconds   INR 1.1 0.8 - 1.2    Comment: (NOTE) INR goal varies based on device and disease states. Performed at [facility], [place] [address] [place], [place] [address]   APTT     Status: Abnormal   Collection Time: 08/11/20 12:33 PM  Result Value Ref Range   aPTT 38 (H) 24 - 36 seconds    Comment:        IF BASELINE aPTT IS ELEVATED, SUGGEST PATIENT RISK ASSESSMENT BE USED TO DETERMINE APPROPRIATE ANTICOAGULANT THERAPY. Performed at [facility], [place] [address], [place], [place] [address]   Urinalysis,  Routine w reflex microscopic Urine, Catheterized     Status: Abnormal   Collection Time: 08/11/20 12:33 PM  Result Value Ref Range   Color, Urine STRAW (A) YELLOW   APPearance CLEAR CLEAR   Specific Gravity, Urine 1.005 1.005 - 1.030   pH 6.0 5.0 - 8.0   Glucose, UA NEGATIVE NEGATIVE mg/dL   Hgb urine dipstick NEGATIVE NEGATIVE   Bilirubin Urine NEGATIVE NEGATIVE   Ketones, ur NEGATIVE NEGATIVE mg/dL   Protein, ur NEGATIVE NEGATIVE mg/dL   Nitrite NEGATIVE NEGATIVE   Leukocytes,Ua NEGATIVE NEGATIVE    Comment: Performed at [facility] [address] [place], [place] [address]  Blood Culture (routine x 2)     Status: None (Preliminary result)   Collection Time: 08/11/20 12:40 PM   Specimen: BLOOD LEFT HAND  Result Value Ref Range   Specimen Description BLOOD LEFT HAND    Special Requests      BOTTLES DRAWN AEROBIC ONLY Blood Culture results may not be optimal due to an inadequate volume of blood received in culture bottles   Culture      NO GROWTH 1 DAY Performed at [facility], [place] [address], [place], [place] [address]    Report Status PENDING   Blood Culture (routine x 2)     Status: None (Preliminary result)   Collection Time: 08/11/20 12:41 PM   Specimen: BLOOD RIGHT ARM  Result Value Ref Range   Specimen Description BLOOD RIGHT ARM    Special Requests      BOTTLES DRAWN AEROBIC AND ANAEROBIC Blood Culture adequate volume   Culture      NO GROWTH 1 DAY Performed at [facility], [place] [address], [place], [place] [address]    Report Status PENDING   Resp Panel by RT-PCR (Flu A&B, Covid) Nasopharyngeal Swab     Status: None   Collection Time: 08/11/20  1:10 PM  Specimen: Nasopharyngeal Swab; Nasopharyngeal(NP) swabs in vial transport medium  Result Value Ref Range   SARS Coronavirus 2 by RT PCR NEGATIVE NEGATIVE    Comment: (NOTE) SARS-CoV-2 target nucleic acids are NOT DETECTED.  The SARS-CoV-2 RNA is generally detectable in upper  respiratory specimens during the acute phase of infection. The lowest concentration of SARS-CoV-2 viral copies this assay can detect is 138 copies/mL. A negative result does not preclude SARS-Cov-2 infection and should not be used as the sole basis for treatment or other patient management decisions. A negative result may occur with  improper specimen collection/handling, submission of specimen other than nasopharyngeal swab, presence of viral mutation(s) within the areas targeted by this assay, and inadequate number of viral copies(<138 copies/mL). A negative result must be combined with clinical observations, patient history, and epidemiological information. The expected result is Negative.  Fact Sheet for Patients:  EntrepreneurPulse.com.au  Fact Sheet for Healthcare Providers:  IncredibleEmployment.be  This test is no t yet approved or cleared by the [place] FDA and  has been authorized for detection and/or diagnosis of SARS-CoV-2 by FDA under an Emergency Use Authorization (EUA). This EUA will remain  in effect (meaning this test can be used) for the duration of the COVID-19 declaration under Section 564(b)(1) of the Act, 21 U.S.C.section 360bbb-3(b)(1), unless the authorization is terminated  or revoked sooner.       Influenza A by PCR NEGATIVE NEGATIVE   Influenza B by PCR NEGATIVE NEGATIVE    Comment: (NOTE) The Xpert Xpress SARS-CoV-2/FLU/RSV plus assay is intended as an aid in the diagnosis of influenza from Nasopharyngeal swab specimens and should not be used as a sole basis for treatment. Nasal washings and aspirates are unacceptable for Xpert Xpress SARS-CoV-2/FLU/RSV testing.  Fact Sheet for Patients: EntrepreneurPulse.com.au  Fact Sheet for Healthcare Providers: IncredibleEmployment.be  This test is not yet approved or cleared by the [place] FDA and has been authorized for  detection and/or diagnosis of SARS-CoV-2 by FDA under an Emergency Use Authorization (EUA). This EUA will remain in effect (meaning this test can be used) for the duration of the COVID-19 declaration under Section 564(b)(1) of the Act, 21 U.S.C. section 360bbb-3(b)(1), unless the authorization is terminated or revoked.  Performed at [facility], [place] [address] [place], [place] [address]   Lactic acid, plasma     Status: None   Collection Time: 08/11/20  1:14 PM  Result Value Ref Range   Lactic Acid, Venous 1.5 0.5 - 1.9 mmol/L    Comment: Performed at [facility] [address], [place], [place] [address]  CBG monitoring, ED     Status: None   Collection Time: 08/11/20 10:52 PM  Result Value Ref Range   Glucose-Capillary 97 70 - 99 mg/dL    Comment: Glucose reference range applies only to samples taken after fasting for at least 8 hours.   Comment 1 Notify RN    Comment 2 Document in Chart   CBC     Status: Abnormal   Collection Time: 08/12/20  3:44 AM  Result Value Ref Range   WBC 15.9 (H) 4.0 - 10.5 K/uL   RBC 3.69 (L) 3.87 - 5.11 MIL/uL   Hemoglobin 9.4 (L) 12.0 - 15.0 g/dL   HCT 30.0 (L) 36.0 - 46.0 %   MCV 81.3 80.0 - 100.0 fL   MCH 25.5 (L) 26.0 - 34.0 pg   MCHC 31.3 30.0 - 36.0 g/dL   RDW 19.9 (H) 11.5 -  15.5 %   Platelets 717 (H) 150 - 400 K/uL   nRBC 0.0 0.0 - 0.2 %    Comment: Performed at [facility], [place] [address] [place], [place] [address]  Basic metabolic panel     Status: Abnormal   Collection Time: 08/12/20  3:44 AM  Result Value Ref Range   Sodium 136 135 - 145 mmol/L   Potassium 3.4 (L) 3.5 - 5.1 mmol/L   Chloride 106 98 - 111 mmol/L   CO2 19 (L) 22 - 32 mmol/L   Glucose, Bld 96 70 - 99 mg/dL    Comment: Glucose reference range applies only to samples taken after fasting for at least 8 hours.   BUN 7 6 - 20 mg/dL   Creatinine, Ser 0.44 0.44 - 1.00 mg/dL   Calcium 9.0 8.9 - 10.3 mg/dL   GFR, Estimated >60 >60 mL/min     Comment: (NOTE) Calculated using the CKD-EPI Creatinine Equation (2021)    Anion gap 11 5 - 15    Comment: Performed at [facility] [address], [place], [place] [address]  CBG monitoring, ED     Status: Abnormal   Collection Time: 08/12/20  8:16 AM  Result Value Ref Range   Glucose-Capillary 103 (H) 70 - 99 mg/dL    Comment: Glucose reference range applies only to samples taken after fasting for at least 8 hours.   Comment 1 Notify RN   Glucose, capillary     Status: Abnormal   Collection Time: 08/12/20 11:07 AM  Result Value Ref Range   Glucose-Capillary 100 (H) 70 - 99 mg/dL    Comment: Glucose reference range applies only to samples taken after fasting for at least 8 hours.  Occult blood card to lab, stool RN will collect     Status: Abnormal   Collection Time: 08/12/20 11:27 AM  Result Value Ref Range   Fecal Occult Bld POSITIVE (A) NEGATIVE    Comment: Performed at [facility], [place] [address], [place], [place] [address]  MRSA PCR Screening     Status: None   Collection Time: 08/12/20 12:29 PM   Specimen: Nasal Mucosa; Nasopharyngeal  Result Value Ref Range   MRSA by PCR NEGATIVE NEGATIVE    Comment:        The GeneXpert MRSA Assay (FDA approved for NASAL specimens only), is one component of a comprehensive MRSA colonization surveillance program. It is not intended to diagnose MRSA infection nor to guide or monitor treatment for MRSA infections. Performed at [facility], [place] [address] [place], [place] [address]   Glucose, capillary     Status: None   Collection Time: 08/12/20  4:14 PM  Result Value Ref Range   Glucose-Capillary 92 70 - 99 mg/dL    Comment: Glucose reference range applies only to samples taken after fasting for at least 8 hours.    MICRO: reviewed IMAGING: CT ABDOMEN PELVIS W CONTRAST  Result Date: 08/11/2020 CLINICAL DATA:  Sacral wound EXAM: CT ABDOMEN AND PELVIS WITH CONTRAST TECHNIQUE:  Multidetector CT imaging of the abdomen and pelvis was performed using the standard protocol following bolus administration of intravenous contrast. CONTRAST:  126mL OMNIPAQUE IOHEXOL 300 MG/ML  SOLN COMPARISON:  June 16, 2020 FINDINGS: Lower chest: The visualized heart size within normal limits. No pericardial fluid/thickening. No hiatal hernia. The visualized portions of the lungs are clear. Hepatobiliary: The liver is normal in density without focal abnormality.The main portal vein is patent.  No evidence of calcified gallstones, gallbladder wall thickening or biliary dilatation. Pancreas: Unremarkable. No pancreatic ductal dilatation or surrounding inflammatory changes. Spleen: Normal in size without focal abnormality. Adrenals/Urinary Tract: Both adrenal glands appear normal. The kidneys and collecting system appear normal without evidence of urinary tract calculus or hydronephrosis. Bladder is unremarkable. Stomach/Bowel: The stomach and small bowel are unremarkable. There is a large amount of colonic stool seen throughout. A dilated rectum is seen with a fecal ball. Vascular/Lymphatic: There are no enlarged mesenteric, retroperitoneal, or pelvic lymph nodes. Scattered aortic atherosclerotic calcifications are seen without aneurysmal dilatation. Reproductive: Again noted is a heterogeneous fibroid uterus some with calcifications. Other: No evidence of abdominal wall mass or hernia. Musculoskeletal: There is a large sacral decubitus ulceration to the level of the coccyx with slight periosteal reaction in cortical irregularity at the inferior coccyx as the prior exam which could be due to chronic osteomyelitis. No loculated fluid collection is seen. IMPRESSION: Large sacral decubitus ulceration with cortical irregularity at the inferior coccyx which could be due to chronic osteomyelitis as on prior exam. No loculated fluid collections or evidence of acute osteomyelitis Large amount of colonic stool with a  large fecal ball within the rectum. Aortic Atherosclerosis (ICD10-I70.0). Electronically Signed   By: Prudencio Pair M.D.   On: 08/11/2020 19:31   DG Chest Port 1 View  Result Date: 08/11/2020 CLINICAL DATA:  Sacral wound.  Sepsis. EXAM: PORTABLE CHEST 1 VIEW COMPARISON:  06/26/2020 FINDINGS: The cardiac silhouette, mediastinal and hilar contours are within normal limits and stable. There are stable chronic bronchitic type lung changes and emphysema. No focal pulmonary infiltrates or pleural effusions. Stable eventration of the right hemidiaphragm. The bony thorax is intact. IMPRESSION: 1. Chronic lung changes and emphysema. 2. No acute pulmonary findings. Electronically Signed   By: Marijo Sanes M.D.   On: 08/11/2020 13:19   VAS [place] LOWER EXTREMITY VENOUS (DVT)  Result Date: 08/12/2020  Lower Venous DVT Study Indications: Swelling.  Risk Factors: None identified. Comparison Study: No prior studies. Performing Technologist: Oliver Hum RVT  Examination Guidelines: A complete evaluation includes B-mode imaging, spectral Doppler, color Doppler, and power Doppler as needed of all accessible portions of each vessel. Bilateral testing is considered an integral part of a complete examination. Limited examinations for reoccurring indications may be performed as noted. The reflux portion of the exam is performed with the patient in reverse Trendelenburg.  +---------+---------------+---------+-----------+----------+--------------+ RIGHT    CompressibilityPhasicitySpontaneityPropertiesThrombus Aging +---------+---------------+---------+-----------+----------+--------------+ CFV      Full           Yes      Yes                                 +---------+---------------+---------+-----------+----------+--------------+ SFJ      Full                                                        +---------+---------------+---------+-----------+----------+--------------+ FV Prox  Full                                                         +---------+---------------+---------+-----------+----------+--------------+  FV Mid   Full                                                        +---------+---------------+---------+-----------+----------+--------------+ FV DistalFull                                                        +---------+---------------+---------+-----------+----------+--------------+ PFV      Full                                                        +---------+---------------+---------+-----------+----------+--------------+ POP      Full           Yes      Yes                                 +---------+---------------+---------+-----------+----------+--------------+ PTV      Full                                                        +---------+---------------+---------+-----------+----------+--------------+ PERO     Full                                                        +---------+---------------+---------+-----------+----------+--------------+   +----+---------------+---------+-----------+----------+--------------+ LEFTCompressibilityPhasicitySpontaneityPropertiesThrombus Aging +----+---------------+---------+-----------+----------+--------------+ CFV Full           Yes      Yes                                 +----+---------------+---------+-----------+----------+--------------+     Summary: RIGHT: - There is no evidence of deep vein thrombosis in the lower extremity.  - No cystic structure found in the popliteal fossa.  LEFT: - No evidence of common femoral vein obstruction.  *See table(s) above for measurements and observations. Electronically signed by Deitra Mayo MD on 08/12/2020 at 3:48:32 PM.    Final    Assessment/Plan:  60yo F with severe MS, bedbound who developed large sacral decub ulcer over the course of 2 months, treated with 2 wk of abtx in mid February in addition to local wound care. She is readmitted for  worsening wound, tarry stools concern for gib. Blood cx are showing no growth to date.  Leukocytosis = could possibly be due to sacral osteomyelitis, wound bed is appearing better than previously described in February. Possibly course was short at that time. Will plan to check sed rate and crp. Continue on broad spectrum currently and will decide how to narrow as her leukocytosis continues to improve.   Sacral wound and chronic  osteo = rdefer to wound care for when optimal to do wound vac, which might help with prevention of fecal contamination  GIB = tarry stools concerning for upper gib. Agree with gi consult that she may benefit from egd. In the meantime, to do ppi.  Elzie Rings [facility] [phone]

## 2020-08-12 NOTE — Progress Notes (Signed)
Right lower extremity venous duplex has been completed. Preliminary results can be found in CV Proc through chart review.   08/12/20 11:50 AM Tina Griffith RVT

## 2020-08-12 NOTE — Progress Notes (Addendum)
Patient with an episode of melena per nursing. FOBT obtained and is positive. Per patient, she has seen GI in the past but is unsure of who they were. No current outpatient GI. Hemoglobin is currently stable. -Discontinue Lovenox, start Protonix IV -[facility] consulted  Cordelia Poche, MD [facility] 08/12/2020, 3:52 PM

## 2020-08-12 NOTE — Consult Note (Signed)
Consultation  Referring Provider: [facility] Primary Care Physician:  [facility] [place] Primary Gastroenterologist:  unassigned.  Reason for Consultation:  Anemia/ melena  HPI: Tina Griffith is a 60 y.o. female, with several serious comorbidities.  She has history of MS and a movement disorder, then developed pneumonia secondary to COVID-19 for which she was admitted in December 2021.  She had been ambulatory with a walker and a wheelchair up until that time.  She was very weak after COVID-19 and became bedbound and has developed failure to thrive and worsening sacral decubitus.  She had an admission in mid February with sepsis secondary to infected sacral decubitus and underwent debridement and was covered with broad-spectrum antibiotics.  She was discharged to skilled nursing facility. Her family had her brought to the hospital yesterday as they were concerned that she was not receiving good care at the nursing home.  She had apparently been having fevers.  She was documented febrile in the emergency room to 101, on admit WBC of 20,000, hemoglobin of 9 which is about her baseline, INR 1.1 BUN 6/creatinine 0.5 Chest x-ray was negative CT of the abdomen pelvis shows a large sacral decubitus with cortical irregularity at the inferior coccyx concerning for chronic osteomyelitis, no loculated fluid collections or acute osteomyelitis.  Noted to have large amount of stool throughout the colon and a dilated rectum with a large fecal ball within the rectum.Marland Kitchen  Her nurse reports that she has been oozing very dark blackish stool throughout the day today, documented heme positive. Patient is not aware that she has had any significant bowel movements. Patient denies any ongoing abdominal pain, no nausea or vomiting.  She does say that she had been taking 4 Advil per day over the past months for generalized pain. She is not sure when her last bowel movement was  She says she has had prior  colonoscopy that was done in [facility] about 10 years ago, cannot tell me the results of that, may have had polyps.  She does not believe she has had prior EGD.  Patient had been placed on Lovenox on admit, now discontinued  Review of her labs show that she has had a chronic anemia, with hemoglobin of 11.24 May 2020, she was documented heme negative at that time.  Hemoglobin 8.5 on 06/27/2020.  Hemoglobin yesterday 9, hemoglobin today 9.4 stable, BUN 7/creatinine 0.44           Past Medical History:  Diagnosis Date  . Constipation   . Hypertension   . Movement disorder   . MS (multiple sclerosis) ([place])   . Vision abnormalities     Past Surgical History:  Procedure Laterality Date  . COLOSTOMY    . COLOSTOMY REVERSAL    . HERNIA REPAIR    . KNEE SURGERY    . WOUND DEBRIDEMENT Right 06/19/2020   Procedure: DEBRIDEMENT OF SACRAL WOUND;  Surgeon: Armandina Gemma, MD;  Location: [facility];  Service: General;  Laterality: Right;    Prior to Admission medications   Medication Sig Start Date End Date Taking? Authorizing Provider  acetaminophen (TYLENOL) 325 MG tablet Take 650 mg by mouth every 6 (six) hours as needed for fever (100.4 and above).    [provider]  Ascorbic Acid (VITAMIN C) 1000 MG tablet Take 1,000 mg by mouth in the morning and at bedtime. 05/21/20   [provider]  ASPIRIN LOW DOSE 81 MG EC tablet Take 81 mg by mouth daily. 06/10/20  [provider]  benzonatate (TESSALON) 100 MG capsule Take 100 mg by mouth 3 (three) times daily. 05/18/20   [provider]  budesonide-formoterol (SYMBICORT) 80-4.5 MCG/ACT inhaler Inhale 2 puffs into the lungs daily. 05/21/20   [provider]  CALMOSEPTINE 0.44-20.6 % OINT Apply 1 application topically 3 (three) times daily as needed (skin irritation). 06/10/20   [provider]  dalfampridine 10 MG TB12 Take 1 tablet (10 mg total) by mouth 2 (two) times daily. 08/25/16   Sater,  Nanine Means, MD  Dulaglutide (TRULICITY) 1.5 FM/3.8GY SOPN Inject 1.5 mg into the skin every Friday.    [provider]  FEROSUL 325 (65 Fe) MG tablet Take 325 mg by mouth 2 (two) times daily. 05/18/20   [provider]  liraglutide (VICTOZA) 18 MG/3ML SOPN Inject 0.6 mg into the skin daily.    [provider]  LIVALO 2 MG TABS Take 1 tablet by mouth daily. 06/05/20   [provider]  metoprolol succinate (TOPROL-XL) 100 MG 24 hr tablet Take 100 mg by mouth daily. 05/19/20   [provider]  Misc Natural Products (AIRBORNE ELDERBERRY) CHEW Chew 2 tablets by mouth daily.    [provider]  Multiple Vitamin (MULTIVITAMIN ADULT) TABS Take 1 tablet by mouth daily.    [provider]  naloxegol oxalate (MOVANTIK) 25 MG TABS tablet Take 25 mg by mouth daily.    [provider]  Naloxone HCl 0.4 MG/0.4ML SOAJ Use as directed if unable to arouse the patient Patient not taking: No sig reported 01/16/16   Sater, Nanine Means, MD  nutrition supplement, JUVEN, (JUVEN) PACK Take 1 packet by mouth 2 (two) times daily between meals.    [provider]  Nutritional Supplements (ENSURE PO) Take 237 mLs by mouth See admin instructions. 3 times daily with med pass    [provider]  Ocrelizumab 300 MG/10ML SOLN Inject 600 mg into the vein every 6 (six) months.    [provider]  omeprazole (PRILOSEC) 20 MG capsule Take 20 mg by mouth daily.    [provider]  OVER THE COUNTER MEDICATION Take 30 mLs by mouth in the morning and at bedtime.    [provider]  OVER THE COUNTER MEDICATION Take 1 Dose by mouth in the morning and at bedtime. Protein powder    [provider]  oxybutynin (DITROPAN-XL) 10 MG 24 hr tablet Take 10 mg by mouth daily. 06/11/20   [provider]  Oxycodone HCl 10 MG TABS Take by mouth.    [provider]  oxyCODONE-acetaminophen (PERCOCET) 10-325 MG tablet  Take 1 tablet by mouth every 4 (four) hours as needed for pain.    [provider]  potassium chloride (MICRO-K) 10 MEQ CR capsule Take 10 mEq by mouth 2 (two) times daily. 05/19/20   [provider]  tiZANidine (ZANAFLEX) 4 MG tablet TAKE TWO (2) TABLETS THREE (3) TIMES DAILY Patient taking differently: Take 8 mg by mouth 3 (three) times daily. 03/05/16   Sater, Nanine Means, MD  TRULANCE 3 MG TABS Take 1 tablet by mouth daily. 05/20/20   [provider]  zinc gluconate 50 MG tablet Take 50 mg by mouth daily. 05/21/20   [provider]    Current Facility-Administered Medications  Medication Dose Route Frequency Provider Last Rate Last Admin  . acetaminophen (TYLENOL) tablet 650 mg  650 mg Oral Q6H PRN Shela Leff, MD   650 mg at 08/11/20 2317  Or  . acetaminophen (TYLENOL) suppository 650 mg  650 mg Rectal Q6H PRN Shela Leff, MD      . ceFEPIme (MAXIPIME) 2 g in sodium chloride 0.9 % 100 mL IVPB  2 g Intravenous Q8H Shela Leff, MD 200 mL/hr at 08/12/20 1250 2 g at 08/12/20 1250  . HYDROcodone-acetaminophen (NORCO/VICODIN) 5-325 MG per tablet 1-2 tablet  1-2 tablet Oral Q6H PRN Mariel Aloe, MD   2 tablet at 08/12/20 1248  . insulin aspart (novoLOG) injection 0-5 Units  0-5 Units Subcutaneous QHS Shela Leff, MD      . insulin aspart (novoLOG) injection 0-9 Units  0-9 Units Subcutaneous TID WC Shela Leff, MD      . metoprolol succinate (TOPROL-XL) 24 hr tablet 100 mg  100 mg Oral Daily Vance Gather B, MD      . metroNIDAZOLE (FLAGYL) IVPB 500 mg  500 mg Intravenous Q8H Shela Leff, MD 100 mL/hr at 08/12/20 1254 500 mg at 08/12/20 1254  . nicotine (NICODERM CQ - dosed in mg/24 hours) patch 21 mg  21 mg Transdermal Daily Shela Leff, MD      . pantoprazole (PROTONIX) injection 40 mg  40 mg Intravenous Q12H Mariel Aloe, MD      . polyethylene glycol (MIRALAX / GLYCOLAX) packet 17 g  17 g Oral Daily Shela Leff, MD      . senna-docusate (Senokot-S) tablet 1 tablet  1 tablet Oral QHS Shela Leff, MD   1 tablet at 08/11/20 2317  . vancomycin (VANCOREADY) IVPB 1250 mg/250 mL  1,250 mg Intravenous Q24H Shela Leff, MD 166.7 mL/hr at 08/12/20 1440 1,250 mg at 08/12/20 1440    Allergies as of 08/11/2020 - Review Complete 08/11/2020  Allergen Reaction Noted  . No known allergies  12/04/2014    Family History  Problem Relation Age of Onset  . Lung cancer Mother   . Stroke Mother   . Diabetes Father     Social History   Socioeconomic History  . Marital status: Single    Spouse name: Not on file  . Number of children: Not on file  . Years of education: Not on file  . Highest education level: Not on file  Occupational History  . Not on file  Tobacco Use  . Smoking status: Current Every Day Smoker    Packs/day: 1.00    Types: Cigarettes  . Smokeless tobacco: Never Used  Substance and Sexual Activity  . Alcohol use: No    Alcohol/week: 0.0 standard drinks  . Drug use: No  . Sexual activity: Not on file  Other Topics Concern  . Not on file  Social History Narrative  . Not on file   Social Determinants of Health   Financial Resource Strain: Not on file  Food Insecurity: Not on file  Transportation Needs: Not on file  Physical Activity: Not on file  Stress: Not on file  Social Connections: Not on file  Intimate Partner Violence: Not on file    Review of Systems: Pertinent positive and negative review of systems were noted in the above HPI section.  All other review of systems was otherwise negative.  Physical Exam: Vital signs in last 24 hours: Temp:  [97.7 F (36.5 C)-98.8 F (37.1 C)] 98.4 F (36.9 C) (03/22 1417) Pulse Rate:  [99-116] 108 (03/22 1417) Resp:  [15-27] 16 (03/22 1417) BP: (120-156)/(65-111) 142/73 (03/22 1417) SpO2:  [95 %-100 %] 100 % (03/22 1417) Last BM Date: 08/12/20 General:   Alert,  Well-developed, chronically ill-appearing  African-American female, pleasant and cooperative in NAD Head:  Normocephalic and atraumatic. Eyes:  Sclera clear, no icterus.   Conjunctiva pink. Ears:  Normal auditory acuity. Nose:  No deformity, discharge,  or lesions. Mouth:  No deformity or lesions.   Neck:  Supple; no masses or thyromegaly. Lungs:  Clear throughout to auscultation.   No wheezes, crackles, or rhonchi. Heart:  Regular rate and rhythm; no murmurs, clicks, rubs,  or gallops. Abdomen:  Soft, protuberant, low midline incisional scar, mild tenderness in the left mid quadrant BS active,nonpalp mass or hsm.   Rectal: Not done, nursing reports oozing dark blackish stool today, heme positive Msk:  Symmetrical without gross deformities. . Pulses:  Normal pulses noted. Extremities:  Without clubbing or edema. Neurologic:  Alert and  oriented x4;  grossly normal neurologically. Skin: Very large sacral decubitus Psych:  Alert and cooperative. Normal mood and affect.  Intake/Output from previous day: 03/21 0701 - 03/22 0700 In: 1000 [IV Piggyback:1000] Out: -  Intake/Output this shift: Total I/O In: 263.7 [IV Piggyback:263.7] Out: -   Lab Results: Recent Labs    08/11/20 1233 08/12/20 0344  WBC 20.0* 15.9*  HGB 9.0* 9.4*  HCT 28.2* 30.0*  PLT 773* 717*   BMET Recent Labs    08/11/20 1233 08/12/20 0344  NA 133* 136  K 4.2 3.4*  CL 99 106  CO2 24 19*  GLUCOSE 119* 96  BUN 6 7  CREATININE 0.52 0.44  CALCIUM 9.3 9.0   LFT Recent Labs    08/11/20 1233  PROT 5.5*  ALBUMIN 2.0*  AST 17  ALT 29  ALKPHOS 119  BILITOT 0.5   PT/INR Recent Labs    08/11/20 1233  LABPROT 13.9  INR 1.1     IMPRESSION:  #4 60 year old African-American female with melena/very dark heme positive stool noted today.  Hemoglobin stable since admission and actually stable over the past couple of weeks  Patient has a fecal impaction on CT scan, and has been oozing around the impaction. Unclear whether the impaction and  possible associated stercoral ulcer may be causing the dark stool versus upper GI source in setting of chronic NSAID use.  #2 large sacral decubitus, with SIRS-also had admit in February 2022 for same and required debridement and IV antibiotics.  It appears she has at least chronic osteomyelitis associated  #3 chronic normocytic anemia-mild drift since January 2022  #4 adult onset diabetes mellitus 5.  Multiple sclerosis 6.  Movement disorder 7.  COVID-19 pneumonia December 2022 with decline in functional status since  Plan-Will start with disimpaction and smog enemas tonight. Continue MiraLAX twice daily Continue IV PPI twice daily Hold Lovenox Serial hemoglobins every 8 hours and transfuse for hemoglobin less than 7.5 She will need EGD at some point in the next 24 to 48 hours depending on stability of hemoglobin.  We will keep her n.p.o. in a.m., for possible EGD tomorrow if hemoglobin drops, otherwise may be Thursday for procedure.  Thank you will follow with you     Amy EsterwoodPA-C  08/12/2020, 4:10 PM

## 2020-08-12 NOTE — Progress Notes (Signed)
PROGRESS NOTE  Tina Griffith  VOJ:[id number] DOB: 22-Dec-1960 DOA: 08/11/2020 PCP: [name]   Brief Narrative: Tina Griffith is a 60 y.o. female with a history of MS, NIDT2DM, HTN, IBS-C who suffered covid-19 pneumonia in December 2021 with resultant bedbound status and developed sacral decubitus ulcer which became infected necessitating admission 06/16/2020 and debridement 1/27. Ultimately was discharged to [facility] 2/14, and returns to [facility] 3/21 from SNF due to family report of fever, malodorous discharge from the wound, AMS, and neglect from facility. on basis of family's feeling that the facility is neglecting her. In the ED she was febrile, tachycardic, tachypneic with leukocytosis, thrombocytosis. She had a large sacral decubitus ulcer (pictured in H&P) seen on exam with CT abd/pelvis revealing changes consistent with chronic osteomyelitis  without abscess or acute osteomyelitis. UA not consistent with infection and CXR without lobar infiltrate. Vancomycin, cefepime, and flagyl were given after blood cultures were drawn and she was admitted to [facility] based on bed availability with ID consulted.   Assessment & Plan: Principal Problem:   Sepsis ([place]) Active Problems:   Constipation   Tobacco abuse   AMS (altered mental status)   Diabetes ([place])  Sepsis due to infected sacral decubitus ulcer with chronic osteomyelitis:  - Continue broad antimicrobial therapy pending ID consult and culture results.  - Pain control as ordered  Constipation with fecal ball on CT:  - Started bowel regimen, may need enema if not effective.   Multiple sclerosis: Seen by Dr. [name].   RLE edema:  - Venous U/S ordered for DVT r/o.   HTN: Has been on metoprolol confirmed by last discharge summary. This will be reordered with tachycardia and hypertension  NIDT2DM: HbA1c 6.5% indicating good chronic control.  - SSI  Tobacco use: 1ppd.  - Cessation counseling, nicotine patch.  Concern for neglect at  [facility].  - CSW consulted.   Reported AMS: Not noted by medical personnel. Reported by family.  IBS, GERD:  - Was on PPI previously.   Note medication reconciliation is still pending. Will need to perform full med rec prior to reordering home medications as there are overlapping/duplicate medications on current unreviewed med list.  RN Pressure Injury Documentation: Pressure Injury 06/17/20 Buttocks Right;Anterior Stage 4 - Full thickness tissue loss with exposed bone, tendon or muscle. Deep Oval shaped pressure wound (6cmx4cmx3cm) muscle and bone exposed, areas of necrotic tissue at the edges, red & yellow inside w (Active)  06/17/20 2200  Location: Buttocks  Location Orientation: Right;Anterior  Staging: Stage 4 - Full thickness tissue loss with exposed bone, tendon or muscle.  Wound Description (Comments): Deep Oval shaped pressure wound (6cmx4cmx3cm) muscle and bone exposed, areas of necrotic tissue at the edges, red & yellow inside w/active bleeding & tunneling, foul smelling  Present on Admission: Yes   DVT prophylaxis: Lovenox Code Status: Full Family Communication: None at bedside Disposition Plan:  Status is: Inpatient  Remains inpatient appropriate because:Ongoing diagnostic testing needed not appropriate for outpatient work up and Inpatient level of care appropriate due to severity of illness   Dispo: The patient is from: SNF              Anticipated d/c is to: SNF              Patient currently is not medically stable to d/c.   Difficult to place patient No  Consultants:   ID  Procedures:   None  Antimicrobials:  Vancomycin, cefepime, flagyl 3/21 >>   Subjective: Seen just  before being transferred to [facility] this morning. Pt reports she is feeling slightly better, no fever this AM, wants to eat breakfast but will need assistance. No palpitations, chest pain or dyspnea. No lower abd pain. The sacral ulcer does hurt, but not currently severe.   Objective: Vitals:    08/12/20 0800 08/12/20 0815 08/12/20 1008 08/12/20 1417  BP: (!) 147/85 (!) 120/91 (!) 152/82 (!) 142/73  Pulse:  (!) 116 (!) 110 (!) 108  Resp: (!) 21 17 16 16   Temp:   98.8 F (37.1 C) 98.4 F (36.9 C)  TempSrc:   Oral Oral  SpO2:  98% 99% 100%    Intake/Output Summary (Last 24 hours) at 08/12/2020 1523 Last data filed at 08/12/2020 1505 Gross per 24 hour  Intake 1263.72 ml  Output --  Net 1263.72 ml   Gen: 60 y.o. female in no distress appearing older than stated age Pulm: Non-labored breathing. Clear to auscultation bilaterally.  CV: Regular rate and rhythm. No murmur, rub, or gallop. No JVD, R > L pedal edema. GI: Abdomen soft, non-tender, non-distended, with normoactive bowel sounds. No organomegaly or masses felt. Ext: Warm, no deformities Skin: Sacral wound not examined as it causes discomfort to move patient and she declines as this was pictured last night. Neuro: Alert and oriented. No new focal neurological deficits. Psych: Judgement and insight appear normal. Mood pleasant, euthymic & affect appropriate.   Data Reviewed: I have personally reviewed following labs and imaging studies  CBC: Recent Labs  Lab 08/11/20 1233 08/12/20 0344  WBC 20.0* 15.9*  NEUTROABS 16.2*  --   HGB 9.0* 9.4*  HCT 28.2* 30.0*  MCV 79.0* 81.3  PLT 773* 242*   Basic Metabolic Panel: Recent Labs  Lab 08/11/20 1233 08/12/20 0344  NA 133* 136  K 4.2 3.4*  CL 99 106  CO2 24 19*  GLUCOSE 119* 96  BUN 6 7  CREATININE 0.52 0.44  CALCIUM 9.3 9.0   GFR: CrCl cannot be calculated (Unknown ideal weight.). Liver Function Tests: Recent Labs  Lab 08/11/20 1233  AST 17  ALT 29  ALKPHOS 119  BILITOT 0.5  PROT 5.5*  ALBUMIN 2.0*   No results for input(s): LIPASE, AMYLASE in the last 168 hours. No results for input(s): AMMONIA in the last 168 hours. Coagulation Profile: Recent Labs  Lab 08/11/20 1233  INR 1.1   Cardiac Enzymes: No results for input(s): CKTOTAL, CKMB,  CKMBINDEX, TROPONINI in the last 168 hours. BNP (last 3 results) No results for input(s): PROBNP in the last 8760 hours. HbA1C: No results for input(s): HGBA1C in the last 72 hours. CBG: Recent Labs  Lab 08/11/20 2252 08/12/20 0816 08/12/20 1107  GLUCAP 97 103* 100*   Lipid Profile: No results for input(s): CHOL, HDL, LDLCALC, TRIG, CHOLHDL, LDLDIRECT in the last 72 hours. Thyroid Function Tests: No results for input(s): TSH, T4TOTAL, FREET4, T3FREE, THYROIDAB in the last 72 hours. Anemia Panel: No results for input(s): VITAMINB12, FOLATE, FERRITIN, TIBC, IRON, RETICCTPCT in the last 72 hours. Urine analysis:    Component Value Date/Time   COLORURINE STRAW (A) 08/11/2020 1233   APPEARANCEUR CLEAR 08/11/2020 1233   APPEARANCEUR Clear 07/25/2014 1100   LABSPEC 1.005 08/11/2020 1233   PHURINE 6.0 08/11/2020 1233   GLUCOSEU NEGATIVE 08/11/2020 1233   HGBUR NEGATIVE 08/11/2020 1233   BILIRUBINUR NEGATIVE 08/11/2020 1233   BILIRUBINUR Negative 07/25/2014 [place] 08/11/2020 1233   PROTEINUR NEGATIVE 08/11/2020 1233   UROBILINOGEN 0.2 10/16/2014 1355  NITRITE NEGATIVE 08/11/2020 1233   LEUKOCYTESUR NEGATIVE 08/11/2020 1233   Recent Results (from the past 240 hour(s))  Blood Culture (routine x 2)     Status: None (Preliminary result)   Collection Time: 08/11/20 12:40 PM   Specimen: BLOOD LEFT HAND  Result Value Ref Range Status   Specimen Description BLOOD LEFT HAND  Final   Special Requests   Final    BOTTLES DRAWN AEROBIC ONLY Blood Culture results may not be optimal due to an inadequate volume of blood received in culture bottles   Culture   Final    NO GROWTH 1 DAY Performed at [facility], [place] [address] [place], [place] [address]    Report Status PENDING  Incomplete  Blood Culture (routine x 2)     Status: None (Preliminary result)   Collection Time: 08/11/20 12:41 PM   Specimen: BLOOD RIGHT ARM  Result Value Ref Range Status   Specimen  Description BLOOD RIGHT ARM  Final   Special Requests   Final    BOTTLES DRAWN AEROBIC AND ANAEROBIC Blood Culture adequate volume   Culture   Final    NO GROWTH 1 DAY Performed at [facility], [place] [address], [place], [place] [address]    Report Status PENDING  Incomplete  Resp Panel by RT-PCR (Flu A&B, Covid) Nasopharyngeal Swab     Status: None   Collection Time: 08/11/20  1:10 PM   Specimen: Nasopharyngeal Swab; Nasopharyngeal(NP) swabs in vial transport medium  Result Value Ref Range Status   SARS Coronavirus 2 by RT PCR NEGATIVE NEGATIVE Final    Comment: (NOTE) SARS-CoV-2 target nucleic acids are NOT DETECTED.  The SARS-CoV-2 RNA is generally detectable in upper respiratory specimens during the acute phase of infection. The lowest concentration of SARS-CoV-2 viral copies this assay can detect is 138 copies/mL. A negative result does not preclude SARS-Cov-2 infection and should not be used as the sole basis for treatment or other patient management decisions. A negative result may occur with  improper specimen collection/handling, submission of specimen other than nasopharyngeal swab, presence of viral mutation(s) within the areas targeted by this assay, and inadequate number of viral copies(<138 copies/mL). A negative result must be combined with clinical observations, patient history, and epidemiological information. The expected result is Negative.  Fact Sheet for Patients:  EntrepreneurPulse.com.au  Fact Sheet for Healthcare Providers:  IncredibleEmployment.be  This test is no t yet approved or cleared by the [place] FDA and  has been authorized for detection and/or diagnosis of SARS-CoV-2 by FDA under an Emergency Use Authorization (EUA). This EUA will remain  in effect (meaning this test can be used) for the duration of the COVID-19 declaration under Section 564(b)(1) of the Act, 21 U.S.C.section 360bbb-3(b)(1),  unless the authorization is terminated  or revoked sooner.       Influenza A by PCR NEGATIVE NEGATIVE Final   Influenza B by PCR NEGATIVE NEGATIVE Final    Comment: (NOTE) The Xpert Xpress SARS-CoV-2/FLU/RSV plus assay is intended as an aid in the diagnosis of influenza from Nasopharyngeal swab specimens and should not be used as a sole basis for treatment. Nasal washings and aspirates are unacceptable for Xpert Xpress SARS-CoV-2/FLU/RSV testing.  Fact Sheet for Patients: EntrepreneurPulse.com.au  Fact Sheet for Healthcare Providers: IncredibleEmployment.be  This test is not yet approved or cleared by the [place] FDA and has been authorized for detection and/or diagnosis of SARS-CoV-2 by FDA under an Emergency Use Authorization (EUA). This EUA  will remain in effect (meaning this test can be used) for the duration of the COVID-19 declaration under Section 564(b)(1) of the Act, 21 U.S.C. section 360bbb-3(b)(1), unless the authorization is terminated or revoked.  Performed at [facility], [place] [address] [place], [place] [address]       Radiology Studies: CT ABDOMEN PELVIS W CONTRAST  Result Date: 08/11/2020 CLINICAL DATA:  Sacral wound EXAM: CT ABDOMEN AND PELVIS WITH CONTRAST TECHNIQUE: Multidetector CT imaging of the abdomen and pelvis was performed using the standard protocol following bolus administration of intravenous contrast. CONTRAST:  140mL OMNIPAQUE IOHEXOL 300 MG/ML  SOLN COMPARISON:  June 16, 2020 FINDINGS: Lower chest: The visualized heart size within normal limits. No pericardial fluid/thickening. No hiatal hernia. The visualized portions of the lungs are clear. Hepatobiliary: The liver is normal in density without focal abnormality.The main portal vein is patent. No evidence of calcified gallstones, gallbladder wall thickening or biliary dilatation. Pancreas: Unremarkable. No pancreatic ductal dilatation or  surrounding inflammatory changes. Spleen: Normal in size without focal abnormality. Adrenals/Urinary Tract: Both adrenal glands appear normal. The kidneys and collecting system appear normal without evidence of urinary tract calculus or hydronephrosis. Bladder is unremarkable. Stomach/Bowel: The stomach and small bowel are unremarkable. There is a large amount of colonic stool seen throughout. A dilated rectum is seen with a fecal ball. Vascular/Lymphatic: There are no enlarged mesenteric, retroperitoneal, or pelvic lymph nodes. Scattered aortic atherosclerotic calcifications are seen without aneurysmal dilatation. Reproductive: Again noted is a heterogeneous fibroid uterus some with calcifications. Other: No evidence of abdominal wall mass or hernia. Musculoskeletal: There is a large sacral decubitus ulceration to the level of the coccyx with slight periosteal reaction in cortical irregularity at the inferior coccyx as the prior exam which could be due to chronic osteomyelitis. No loculated fluid collection is seen. IMPRESSION: Large sacral decubitus ulceration with cortical irregularity at the inferior coccyx which could be due to chronic osteomyelitis as on prior exam. No loculated fluid collections or evidence of acute osteomyelitis Large amount of colonic stool with a large fecal ball within the rectum. Aortic Atherosclerosis (ICD10-I70.0). Electronically Signed   By: Prudencio Pair M.D.   On: 08/11/2020 19:31   DG Chest Port 1 View  Result Date: 08/11/2020 CLINICAL DATA:  Sacral wound.  Sepsis. EXAM: PORTABLE CHEST 1 VIEW COMPARISON:  06/26/2020 FINDINGS: The cardiac silhouette, mediastinal and hilar contours are within normal limits and stable. There are stable chronic bronchitic type lung changes and emphysema. No focal pulmonary infiltrates or pleural effusions. Stable eventration of the right hemidiaphragm. The bony thorax is intact. IMPRESSION: 1. Chronic lung changes and emphysema. 2. No acute  pulmonary findings. Electronically Signed   By: Marijo Sanes M.D.   On: 08/11/2020 13:19   VAS [place] LOWER EXTREMITY VENOUS (DVT)  Result Date: 08/12/2020  Lower Venous DVT Study Indications: Swelling.  Risk Factors: None identified. Comparison Study: No prior studies. Performing Technologist: Oliver Hum RVT  Examination Guidelines: A complete evaluation includes B-mode imaging, spectral Doppler, color Doppler, and power Doppler as needed of all accessible portions of each vessel. Bilateral testing is considered an integral part of a complete examination. Limited examinations for reoccurring indications may be performed as noted. The reflux portion of the exam is performed with the patient in reverse Trendelenburg.  +---------+---------------+---------+-----------+----------+--------------+ RIGHT    CompressibilityPhasicitySpontaneityPropertiesThrombus Aging +---------+---------------+---------+-----------+----------+--------------+ CFV      Full           Yes      Yes                                 +---------+---------------+---------+-----------+----------+--------------+  SFJ      Full                                                        +---------+---------------+---------+-----------+----------+--------------+ FV Prox  Full                                                        +---------+---------------+---------+-----------+----------+--------------+ FV Mid   Full                                                        +---------+---------------+---------+-----------+----------+--------------+ FV DistalFull                                                        +---------+---------------+---------+-----------+----------+--------------+ PFV      Full                                                        +---------+---------------+---------+-----------+----------+--------------+ POP      Full           Yes      Yes                                  +---------+---------------+---------+-----------+----------+--------------+ PTV      Full                                                        +---------+---------------+---------+-----------+----------+--------------+ PERO     Full                                                        +---------+---------------+---------+-----------+----------+--------------+   +----+---------------+---------+-----------+----------+--------------+ LEFTCompressibilityPhasicitySpontaneityPropertiesThrombus Aging +----+---------------+---------+-----------+----------+--------------+ CFV Full           Yes      Yes                                 +----+---------------+---------+-----------+----------+--------------+     Summary: RIGHT: - There is no evidence of deep vein thrombosis in the lower extremity.  - No cystic structure found in the popliteal fossa.  LEFT: - No evidence of common femoral vein obstruction.  *See table(s) above for measurements and observations.    Preliminary  Scheduled Meds: . enoxaparin (LOVENOX) injection  40 mg Subcutaneous Q24H  . insulin aspart  0-5 Units Subcutaneous QHS  . insulin aspart  0-9 Units Subcutaneous TID WC  . nicotine  21 mg Transdermal Daily  . polyethylene glycol  17 g Oral Daily  . senna-docusate  1 tablet Oral QHS   Continuous Infusions: . ceFEPime (MAXIPIME) IV 2 g (08/12/20 1250)  . metronidazole 500 mg (08/12/20 1254)  . vancomycin 1,250 mg (08/12/20 1440)     LOS: 1 day   Time spent: 25 minutes.  Patrecia Pour, MD [facility] [email or web address] 08/12/2020, 3:23 PM

## 2020-08-12 NOTE — ED Notes (Signed)
[facility] is here tranporting pt to [facility].

## 2020-08-13 ENCOUNTER — Encounter (HOSPITAL_COMMUNITY): Payer: Self-pay | Admitting: Certified Registered"

## 2020-08-13 DIAGNOSIS — M4628 Osteomyelitis of vertebra, sacral and sacrococcygeal region: Secondary | ICD-10-CM | POA: Diagnosis not present

## 2020-08-13 DIAGNOSIS — K5641 Fecal impaction: Secondary | ICD-10-CM | POA: Diagnosis not present

## 2020-08-13 DIAGNOSIS — D649 Anemia, unspecified: Secondary | ICD-10-CM | POA: Diagnosis not present

## 2020-08-13 DIAGNOSIS — K5900 Constipation, unspecified: Secondary | ICD-10-CM | POA: Diagnosis not present

## 2020-08-13 DIAGNOSIS — R509 Fever, unspecified: Secondary | ICD-10-CM | POA: Diagnosis not present

## 2020-08-13 DIAGNOSIS — K5909 Other constipation: Secondary | ICD-10-CM | POA: Diagnosis not present

## 2020-08-13 DIAGNOSIS — R195 Other fecal abnormalities: Secondary | ICD-10-CM | POA: Diagnosis not present

## 2020-08-13 LAB — GLUCOSE, CAPILLARY
Glucose-Capillary: 108 mg/dL — ABNORMAL HIGH (ref 70–99)
Glucose-Capillary: 85 mg/dL (ref 70–99)
Glucose-Capillary: 132 mg/dL — ABNORMAL HIGH (ref 70–99)
Glucose-Capillary: 134 mg/dL — ABNORMAL HIGH (ref 70–99)

## 2020-08-13 LAB — CBC
HCT: 28.5 % — ABNORMAL LOW (ref 36.0–46.0)
Hemoglobin: 8.7 g/dL — ABNORMAL LOW (ref 12.0–15.0)
MCH: 24.7 pg — ABNORMAL LOW (ref 26.0–34.0)
MCHC: 30.5 g/dL (ref 30.0–36.0)
MCV: 81 fL (ref 80.0–100.0)
Platelets: 792 10*3/uL — ABNORMAL HIGH (ref 150–400)
RBC: 3.52 MIL/uL — ABNORMAL LOW (ref 3.87–5.11)
RDW: 19.6 % — ABNORMAL HIGH (ref 11.5–15.5)
WBC: 19.2 10*3/uL — ABNORMAL HIGH (ref 4.0–10.5)
nRBC: 0.1 % (ref 0.0–0.2)

## 2020-08-13 LAB — BASIC METABOLIC PANEL
Anion gap: 11 (ref 5–15)
BUN: 7 mg/dL (ref 6–20)
CO2: 17 mmol/L — ABNORMAL LOW (ref 22–32)
Calcium: 8.9 mg/dL (ref 8.9–10.3)
Chloride: 109 mmol/L (ref 98–111)
Creatinine, Ser: 0.63 mg/dL (ref 0.44–1.00)
GFR, Estimated: >60 mL/min (ref >60)
Glucose, Bld: 94 mg/dL (ref 70–99)
Potassium: 3.2 mmol/L — ABNORMAL LOW (ref 3.5–5.1)
Sodium: 137 mmol/L (ref 135–145)

## 2020-08-13 LAB — HEMOGLOBIN AND HEMATOCRIT, BLOOD
HCT: 27.2 % — ABNORMAL LOW (ref 36.0–46.0)
HCT: 27.1 % — ABNORMAL LOW (ref 36.0–46.0)
Hemoglobin: 8.7 g/dL — ABNORMAL LOW (ref 12.0–15.0)
Hemoglobin: 8.5 g/dL — ABNORMAL LOW (ref 12.0–15.0)

## 2020-08-13 LAB — URINE CULTURE: Culture: NO GROWTH

## 2020-08-13 LAB — SEDIMENTATION RATE: Sed Rate: 65 mm/hr — ABNORMAL HIGH (ref 0–22)

## 2020-08-13 LAB — C-REACTIVE PROTEIN: CRP: 15.4 mg/dL — ABNORMAL HIGH (ref <1.0)

## 2020-08-13 LAB — MAGNESIUM: Magnesium: 1.7 mg/dL (ref 1.7–2.4)

## 2020-08-13 MED ORDER — DALFAMPRIDINE ER 10 MG PO TB12
10.0000 mg | ORAL_TABLET | Freq: Two times a day (BID) | ORAL | Status: DC
Start: 2020-08-13 — End: 2020-09-02

## 2020-08-13 MED ORDER — MOMETASONE FURO-FORMOTEROL FUM 100-5 MCG/ACT IN AERO
2.0000 | INHALATION_SPRAY | Freq: Two times a day (BID) | RESPIRATORY_TRACT | Status: DC
  Administered 2020-08-13 – 2020-09-02 (×40): 2 via RESPIRATORY_TRACT
  Filled 2020-08-13 (×2): qty 8.8

## 2020-08-13 MED ORDER — POLYETHYLENE GLYCOL 3350 17 G PO PACK
17.0000 g | PACK | Freq: Two times a day (BID) | ORAL | Status: DC
  Administered 2020-08-13 – 2020-08-18 (×9): 17 g via ORAL
  Filled 2020-08-13 (×13): qty 1

## 2020-08-13 MED ORDER — POTASSIUM CHLORIDE 10 MEQ/100ML IV SOLN
10.0000 meq | INTRAVENOUS | Status: AC
  Administered 2020-08-13 (×3): 10 meq via INTRAVENOUS
  Filled 2020-08-13 (×3): qty 100

## 2020-08-13 MED ORDER — NYSTATIN 100000 UNIT/ML MT SUSP
5.0000 mL | Freq: Four times a day (QID) | OROMUCOSAL | Status: DC
  Administered 2020-08-13 – 2020-09-02 (×69): 500000 [IU] via ORAL
  Filled 2020-08-13 (×61): qty 5

## 2020-08-13 MED ORDER — OXYBUTYNIN CHLORIDE ER 5 MG PO TB24
10.0000 mg | ORAL_TABLET | Freq: Every day | ORAL | Status: DC
  Administered 2020-08-15 – 2020-09-02 (×19): 10 mg via ORAL
  Filled 2020-08-13 (×19): qty 2

## 2020-08-13 NOTE — Consult Note (Addendum)
[facility] Nurse Consult Note: Reason for Consult: Sacral Stage 4 pressure injury (POA). Known to this Probation officer from her admission in January of this year. Surgery debrided the ulcer at that time (Dr. Harlow Asa). Wound type:Pressure Pressure Injury POA: Yes Measurement:13cm x 16cm x 4cm with sickle-shaped defect in the center adding another 0.4cm depth. Palpable bone in center. Wound bed:90% red, moist 10% scattered yellow Drainage (amount, consistency, odor) small to moderate serous Periwound: intact Dressing procedure/placement/frequency: Two to three times daily (and PRN soiling) normal saline moistened Kerlix roll gauze to fill defect topped with dry gauze 4x4s and an ABD pad secured with tape.  Patient resists full positioning off of ulcer despite two-person assist and fully positioning off of ulcer is not accomplished.  Distal wound margin is too close to anus and additionally patient is frequently incontinent of bowel and bladder for NPWT to be a success.  In house, patient is using a female urinary incontinence powered device (PurWick), this is not commonly found in the community or in facilities at this time.  Recommend consideration of surgical consult for feasibility of diverting ostomies (dual) for bowel and bladder to manage dual incontinence for wound healing.   A nutritional consult is requested for assessment of current status and plan for supplementation.  A mattress replacement is provided because adequate positioning off of ulcer is difficult.  Bilateral pressure redistribution heel boots are provided.  [place] nursing team will not follow, but will remain available to this patient, the nursing and medical teams.  Please re-consult if needed. Thanks, Maudie Flakes, MSN, RN, [place], Arther Abbott  Pager# [phone]

## 2020-08-13 NOTE — Progress Notes (Signed)
Subjective/Chief Complaint: Or 1/27 for debridement of sacral ulcer, history MS, she went to nursing home and has not been getting dressing changes. She was not having bms and now with assistance has a lot of stool. Concern by woc over keeping wound clean due to this.  She has had prior colostomy and takedown in past for unclear reasons.  She and her brother in law are in room   Objective: Vital signs in last 24 hours: Temp:  [97.4 F (36.3 C)-101.3 F (38.5 C)] 97.4 F (36.3 C) (03/23 1402) Pulse Rate:  [86-117] 91 (03/23 1402) Resp:  [16-20] 16 (03/23 1402) BP: (105-136)/(57-70) 105/61 (03/23 1402) SpO2:  [99 %-100 %] 100 % (03/23 1402) Weight:  [76.8 kg] 76.8 kg (03/23 0900) Last BM Date: 08/13/20  Intake/Output from previous day: 03/22 0701 - 03/23 0700 In: 263.7 [IV Piggyback:263.7] Out: 800 [Urine:800] Intake/Output this shift: Total I/O In: 400 [IV Piggyback:400] Out: -   GI soft nontender low midline and llq scars all healed Wound pics reviewed- just had changed- this is clean  Lab Results:  Recent Labs    08/12/20 0344 08/12/20 1743 08/13/20 0050 08/13/20 1058  WBC 15.9*  --  19.2*  --   HGB 9.4*   < > 8.7* 8.7*  HCT 30.0*   < > 28.5* 27.2*  PLT 717*  --  792*  --    < > = values in this interval not displayed.   BMET Recent Labs    08/12/20 0344 08/13/20 0050  NA 136 137  K 3.4* 3.2*  CL 106 109  CO2 19* 17*  GLUCOSE 96 94  BUN 7 7  CREATININE 0.44 0.63  CALCIUM 9.0 8.9   PT/INR Recent Labs    08/11/20 1233  LABPROT 13.9  INR 1.1   ABG No results for input(s): PHART, HCO3 in the last 72 hours.  Invalid input(s): PCO2, PO2  Studies/Results: CT ABDOMEN PELVIS W CONTRAST  Result Date: 08/11/2020 CLINICAL DATA:  Sacral wound EXAM: CT ABDOMEN AND PELVIS WITH CONTRAST TECHNIQUE: Multidetector CT imaging of the abdomen and pelvis was performed using the standard protocol following bolus administration of intravenous contrast. CONTRAST:   146mL OMNIPAQUE IOHEXOL 300 MG/ML  SOLN COMPARISON:  June 16, 2020 FINDINGS: Lower chest: The visualized heart size within normal limits. No pericardial fluid/thickening. No hiatal hernia. The visualized portions of the lungs are clear. Hepatobiliary: The liver is normal in density without focal abnormality.The main portal vein is patent. No evidence of calcified gallstones, gallbladder wall thickening or biliary dilatation. Pancreas: Unremarkable. No pancreatic ductal dilatation or surrounding inflammatory changes. Spleen: Normal in size without focal abnormality. Adrenals/Urinary Tract: Both adrenal glands appear normal. The kidneys and collecting system appear normal without evidence of urinary tract calculus or hydronephrosis. Bladder is unremarkable. Stomach/Bowel: The stomach and small bowel are unremarkable. There is a large amount of colonic stool seen throughout. A dilated rectum is seen with a fecal ball. Vascular/Lymphatic: There are no enlarged mesenteric, retroperitoneal, or pelvic lymph nodes. Scattered aortic atherosclerotic calcifications are seen without aneurysmal dilatation. Reproductive: Again noted is a heterogeneous fibroid uterus some with calcifications. Other: No evidence of abdominal wall mass or hernia. Musculoskeletal: There is a large sacral decubitus ulceration to the level of the coccyx with slight periosteal reaction in cortical irregularity at the inferior coccyx as the prior exam which could be due to chronic osteomyelitis. No loculated fluid collection is seen. IMPRESSION: Large sacral decubitus ulceration with cortical irregularity at the inferior coccyx  which could be due to chronic osteomyelitis as on prior exam. No loculated fluid collections or evidence of acute osteomyelitis Large amount of colonic stool with a large fecal ball within the rectum. Aortic Atherosclerosis (ICD10-I70.0). Electronically Signed   By: Prudencio Pair M.D.   On: 08/11/2020 19:31   VAS [place] LOWER  EXTREMITY VENOUS (DVT)  Result Date: 08/12/2020  Lower Venous DVT Study Indications: Swelling.  Risk Factors: None identified. Comparison Study: No prior studies. Performing Technologist: Oliver Hum RVT  Examination Guidelines: A complete evaluation includes B-mode imaging, spectral Doppler, color Doppler, and power Doppler as needed of all accessible portions of each vessel. Bilateral testing is considered an integral part of a complete examination. Limited examinations for reoccurring indications may be performed as noted. The reflux portion of the exam is performed with the patient in reverse Trendelenburg.  +---------+---------------+---------+-----------+----------+--------------+ RIGHT    CompressibilityPhasicitySpontaneityPropertiesThrombus Aging +---------+---------------+---------+-----------+----------+--------------+ CFV      Full           Yes      Yes                                 +---------+---------------+---------+-----------+----------+--------------+ SFJ      Full                                                        +---------+---------------+---------+-----------+----------+--------------+ FV Prox  Full                                                        +---------+---------------+---------+-----------+----------+--------------+ FV Mid   Full                                                        +---------+---------------+---------+-----------+----------+--------------+ FV DistalFull                                                        +---------+---------------+---------+-----------+----------+--------------+ PFV      Full                                                        +---------+---------------+---------+-----------+----------+--------------+ POP      Full           Yes      Yes                                 +---------+---------------+---------+-----------+----------+--------------+ PTV      Full                                                         +---------+---------------+---------+-----------+----------+--------------+  PERO     Full                                                        +---------+---------------+---------+-----------+----------+--------------+   +----+---------------+---------+-----------+----------+--------------+ LEFTCompressibilityPhasicitySpontaneityPropertiesThrombus Aging +----+---------------+---------+-----------+----------+--------------+ CFV Full           Yes      Yes                                 +----+---------------+---------+-----------+----------+--------------+     Summary: RIGHT: - There is no evidence of deep vein thrombosis in the lower extremity.  - No cystic structure found in the popliteal fossa.  LEFT: - No evidence of common femoral vein obstruction.  *See table(s) above for measurements and observations. Electronically signed by Deitra Mayo MD on 08/12/2020 at 3:48:32 PM.    Final     Anti-infectives: Anti-infectives (From admission, onward)   Start     Dose/Rate Route Frequency Ordered Stop   08/12/20 1400  vancomycin (VANCOREADY) IVPB 1250 mg/250 mL        1,250 mg 166.7 mL/hr over 90 Minutes Intravenous Every 24 hours 08/11/20 1429     08/11/20 2200  ceFEPIme (MAXIPIME) 2 g in sodium chloride 0.9 % 100 mL IVPB        2 g 200 mL/hr over 30 Minutes Intravenous Every 8 hours 08/11/20 1429     08/11/20 2200  metroNIDAZOLE (FLAGYL) IVPB 500 mg        500 mg 100 mL/hr over 60 Minutes Intravenous Every 8 hours 08/11/20 2128     08/11/20 1245  ceFEPIme (MAXIPIME) 2 g in sodium chloride 0.9 % 100 mL IVPB        2 g 200 mL/hr over 30 Minutes Intravenous  Once 08/11/20 1236 08/11/20 1426   08/11/20 1245  metroNIDAZOLE (FLAGYL) IVPB 500 mg        500 mg 100 mL/hr over 60 Minutes Intravenous  Once 08/11/20 1236 08/11/20 1517   08/11/20 1245  vancomycin (VANCOREADY) IVPB 1500 mg/300 mL        1,500 mg 150 mL/hr over 120 Minutes  Intravenous  Once 08/11/20 1236 08/11/20 1634      Assessment/Plan: Sacral wound -do not think at this point any diversion would be best.  Her prior surgical history makes this not an easy task.  Many of issues seem to be inadequate nursing care at facility and this isnt really a reason to put her at risk -she has egd planned tomorrow to evaluate other issues -we can follow back up at later day Rolm Bookbinder 08/13/2020

## 2020-08-13 NOTE — Progress Notes (Signed)
Patient ID: Tina Griffith, female   DOB: 01-Apr-1961, 60 y.o.   MRN: [id number]    Progress Note   Subjective  Day # 3  CC; melena, heme positive stool, fecal impaction  WBC 19.2, hemoglobin 8.7/hematocrit 28.5 stable since last p.m. ESR 65 CRP 15.4  Lovenox on hold  No complaints of abdominal pain, no nausea or vomiting.  Discussed with nursing, she did have some results with smog enemas and disimpaction last evening, small bowel movement today which did not appear melenic.  To receive further enemas today   Objective   Vital signs in last 24 hours: Temp:  [97.4 F (36.3 C)-101.3 F (38.5 C)] 101.3 F (38.5 C) (03/23 0520) Pulse Rate:  [86-117] 86 (03/23 0520) Resp:  [16-20] 20 (03/23 0520) BP: (118-152)/(57-82) 118/57 (03/23 0520) SpO2:  [99 %-100 %] 100 % (03/23 0520) Last BM Date: 08/12/20 General:  Older   African-American female in NAD Heart:  Regular rate and rhythm; no murmurs Lungs: Respirations even and unlabored, lungs CTA bilaterally Abdomen:  Soft, nontender and nondistended. Normal bowel sounds. Extremities:  Without edema. Neurologic:  Alert and oriented,  grossly normal neurologically. Psych:  Cooperative. Normal mood and affect.  Intake/Output from previous day: 03/22 0701 - 03/23 0700 In: 263.7 [IV Piggyback:263.7] Out: 800 [Urine:800] Intake/Output this shift: No intake/output data recorded.  Lab Results: Recent Labs    08/11/20 1233 08/12/20 0344 08/12/20 1743 08/13/20 0050  WBC 20.0* 15.9*  --  19.2*  HGB 9.0* 9.4* 8.7* 8.7*  HCT 28.2* 30.0* 27.3* 28.5*  PLT 773* 717*  --  792*   BMET Recent Labs    08/11/20 1233 08/12/20 0344 08/13/20 0050  NA 133* 136 137  K 4.2 3.4* 3.2*  CL 99 106 109  CO2 24 19* 17*  GLUCOSE 119* 96 94  BUN 6 7 7   CREATININE 0.52 0.44 0.63  CALCIUM 9.3 9.0 8.9   LFT Recent Labs    08/11/20 1233  PROT 5.5*  ALBUMIN 2.0*  AST 17  ALT 29  ALKPHOS 119  BILITOT 0.5   PT/INR Recent Labs     08/11/20 1233  LABPROT 13.9  INR 1.1    Studies/Results: CT ABDOMEN PELVIS W CONTRAST  Result Date: 08/11/2020 CLINICAL DATA:  Sacral wound EXAM: CT ABDOMEN AND PELVIS WITH CONTRAST TECHNIQUE: Multidetector CT imaging of the abdomen and pelvis was performed using the standard protocol following bolus administration of intravenous contrast. CONTRAST:  120m OMNIPAQUE IOHEXOL 300 MG/ML  SOLN COMPARISON:  June 16, 2020 FINDINGS: Lower chest: The visualized heart size within normal limits. No pericardial fluid/thickening. No hiatal hernia. The visualized portions of the lungs are clear. Hepatobiliary: The liver is normal in density without focal abnormality.The main portal vein is patent. No evidence of calcified gallstones, gallbladder wall thickening or biliary dilatation. Pancreas: Unremarkable. No pancreatic ductal dilatation or surrounding inflammatory changes. Spleen: Normal in size without focal abnormality. Adrenals/Urinary Tract: Both adrenal glands appear normal. The kidneys and collecting system appear normal without evidence of urinary tract calculus or hydronephrosis. Bladder is unremarkable. Stomach/Bowel: The stomach and small bowel are unremarkable. There is a large amount of colonic stool seen throughout. A dilated rectum is seen with a fecal ball. Vascular/Lymphatic: There are no enlarged mesenteric, retroperitoneal, or pelvic lymph nodes. Scattered aortic atherosclerotic calcifications are seen without aneurysmal dilatation. Reproductive: Again noted is a heterogeneous fibroid uterus some with calcifications. Other: No evidence of abdominal wall mass or hernia. Musculoskeletal: There is a large sacral decubitus ulceration to  the level of the coccyx with slight periosteal reaction in cortical irregularity at the inferior coccyx as the prior exam which could be due to chronic osteomyelitis. No loculated fluid collection is seen. IMPRESSION: Large sacral decubitus ulceration with cortical  irregularity at the inferior coccyx which could be due to chronic osteomyelitis as on prior exam. No loculated fluid collections or evidence of acute osteomyelitis Large amount of colonic stool with a large fecal ball within the rectum. Aortic Atherosclerosis (ICD10-I70.0). Electronically Signed   By: Prudencio Pair M.D.   On: 08/11/2020 19:31   DG Chest Port 1 View  Result Date: 08/11/2020 CLINICAL DATA:  Sacral wound.  Sepsis. EXAM: PORTABLE CHEST 1 VIEW COMPARISON:  06/26/2020 FINDINGS: The cardiac silhouette, mediastinal and hilar contours are within normal limits and stable. There are stable chronic bronchitic type lung changes and emphysema. No focal pulmonary infiltrates or pleural effusions. Stable eventration of the right hemidiaphragm. The bony thorax is intact. IMPRESSION: 1. Chronic lung changes and emphysema. 2. No acute pulmonary findings. Electronically Signed   By: Marijo Sanes M.D.   On: 08/11/2020 13:19   VAS [place] LOWER EXTREMITY VENOUS (DVT)  Result Date: 08/12/2020  Lower Venous DVT Study Indications: Swelling.  Risk Factors: None identified. Comparison Study: No prior studies. Performing Technologist: Oliver Hum RVT  Examination Guidelines: A complete evaluation includes B-mode imaging, spectral Doppler, color Doppler, and power Doppler as needed of all accessible portions of each vessel. Bilateral testing is considered an integral part of a complete examination. Limited examinations for reoccurring indications may be performed as noted. The reflux portion of the exam is performed with the patient in reverse Trendelenburg.  +---------+---------------+---------+-----------+----------+--------------+ RIGHT    CompressibilityPhasicitySpontaneityPropertiesThrombus Aging +---------+---------------+---------+-----------+----------+--------------+ CFV      Full           Yes      Yes                                  +---------+---------------+---------+-----------+----------+--------------+ SFJ      Full                                                        +---------+---------------+---------+-----------+----------+--------------+ FV Prox  Full                                                        +---------+---------------+---------+-----------+----------+--------------+ FV Mid   Full                                                        +---------+---------------+---------+-----------+----------+--------------+ FV DistalFull                                                        +---------+---------------+---------+-----------+----------+--------------+ PFV      Full                                                        +---------+---------------+---------+-----------+----------+--------------+  POP      Full           Yes      Yes                                 +---------+---------------+---------+-----------+----------+--------------+ PTV      Full                                                        +---------+---------------+---------+-----------+----------+--------------+ PERO     Full                                                        +---------+---------------+---------+-----------+----------+--------------+   +----+---------------+---------+-----------+----------+--------------+ LEFTCompressibilityPhasicitySpontaneityPropertiesThrombus Aging +----+---------------+---------+-----------+----------+--------------+ CFV Full           Yes      Yes                                 +----+---------------+---------+-----------+----------+--------------+     Summary: RIGHT: - There is no evidence of deep vein thrombosis in the lower extremity.  - No cystic structure found in the popliteal fossa.  LEFT: - No evidence of common femoral vein obstruction.  *See table(s) above for measurements and observations. Electronically signed by Deitra Mayo MD on 08/12/2020 at 3:48:32 PM.    Final        Assessment / Plan:    #70 60 year old female debilitated with severe MS, and a movement disorder with significant decline in functional status since having COVID-19 in January and has been bedbound since with development of severe sacral decubitus Now with suspected chronic osteomyelitis Did require debridement while hospitalized echo last month Febrile on readmission 2 days ago-ID following as well as wound care, and surgical consult pending  Question diverting colostomy  #2 fecal impaction-some results with smog enemas, to be continued today  Continue twice daily MiraLAX  #3 melena/heme positive stool-no significant active GI bleeding and hemoglobin has remained relatively stable Lovenox on hold  Plan; IV PPI twice daily Continue MiraLAX twice daily as above Continue with efforts at disimpaction and enemas Continue serial hemoglobins Patient will be scheduled for upper endoscopy tomorrow morning with Dr. Havery Moros to rule out upper GI sources for recent melena.  Procedures been discussed with the patient including indications risks and benefits and she is agreeable to proceed.    Principal Problem:   Sepsis ([place]) Active Problems:   Constipation   Tobacco abuse   Anemia   AMS (altered mental status)   Diabetes ([place])   Fecal impaction ([facility])   Dark stools     LOS: 2 days   Oren Barella EsterwoodPA-C  08/13/2020, 9:06 AM

## 2020-08-13 NOTE — Progress Notes (Addendum)
PROGRESS NOTE    Tina Griffith  ZOX:[id number] DOB: 1961-03-11 DOA: 08/11/2020 PCP: [facility]   Chief Complaint  Patient presents with  . sacral wound    Brief Narrative:  Tina Griffith is a 60 y.o. female with a history of MS, NIDT2DM, HTN, IBS-C who suffered covid-19 pneumonia in December 2021 with resultant bedbound status and developed sacral decubitus ulcer which became infected necessitating admission 06/16/2020 and debridement 1/27. Ultimately was discharged to [facility] 2/14, and [facility] from SNF due to family report of fever, malodorous discharge from the wound, AMS, and neglect from facility. on basis of family's feeling that the facility is neglecting her. In the ED she was febrile, tachycardic, tachypneic with leukocytosis, thrombocytosis. She had a large sacral decubitus ulcer (pictured in H&P) seen on exam with CT abd/pelvis revealing changes consistent with chronic osteomyelitis  without abscess or acute osteomyelitis. UA not consistent with infection and CXR without lobar infiltrate. Vancomycin, cefepime, and flagyl were given after blood cultures were drawn and she was admitted to [facility] based on bed availability with ID consulted.    Assessment & Plan:   Principal Problem:   Sepsis ([place]) Active Problems:   Constipation   Tobacco abuse   Anemia   AMS (altered mental status)   Diabetes ([place])   Fecal impaction ([facility])   Dark stools  Sepsis due to infected sacral decubitus ulcer with chronic osteomyelitis:  - Continue broad antimicrobial therapy pending ID consult and culture results - appreciate ID recommendations - wound c/s - recommending surgical c/s for feasibility of diverting ostomies - Pain control as ordered - blood cx 3/21 pending - urine cx 3/22 with no growth  - Sed rate 65 from 92 on 06/21/20 - CRP 15.4 from 13.9 on 06/21/20 - fever 3/23 am, follow pending cultures  Melena  Concern for Acute Blood Loss Anemia  Iron Def Anemia Low  normal B12, follow MMA Serial Hb - relatively stable Follow repeat anemia labs IV PPI BID Plan for endoscopy 3/24 AM with GI  Constipation with fecal ball on CT:  - appreciate GI assistance - bowel regimen - enemas, disimpaction  Multiple sclerosis: Seen by Dr. [name] outpatient - will need to review what meds she's on for this. dalfampridine  RLE edema:  -negative [place] for DVT  HTN: Has been on metoprolol confirmed by last discharge summary. This will be reordered with tachycardia and hypertension  NIDT2DM: HbA1c 6.5% indicating good chronic control.  - SSI  Tobacco use: 1ppd.  - Cessation counseling, nicotine patch.  Concern for neglect at [facility].  - CSW consulted.   Reported AMS: Not noted by medical personnel. Reported by family. Delirium precautions  IBS, GERD:  - Was on PPI previously.   Note medication reconciliation is still pending. Will need to perform full med rec prior to reordering home medications as there are overlapping/duplicate medications on current unreviewed med list.  RN Pressure Injury Documentation: Pressure Injury 06/17/20 Buttocks Right;Anterior Stage 4 - Full thickness tissue loss with exposed bone, tendon or muscle. Deep Oval shaped pressure wound (6cmx4cmx3cm) muscle and bone exposed, areas of necrotic tissue at the edges, red & yellow inside w (Active)  06/17/20 2200  Location: Buttocks  Location Orientation: Right;Anterior (extending towards the sacrum)  Staging: Stage 4 - Full thickness tissue loss with exposed bone, tendon or muscle.  Wound Description (Comments): Deep Oval shaped pressure wound (6cmx4cmx3cm) muscle and bone exposed, areas of necrotic tissue at the edges, red & yellow inside w/active  bleeding & tunneling, foul smelling  Present on Admission: Yes   DVT prophylaxis: SCD Code Status:full  Family Communication: none at bedside - daughter over phone Disposition:   Status is: Inpatient  Remains inpatient appropriate  because:Inpatient level of care appropriate due to severity of illness   Dispo: The patient is from: Home              Anticipated d/c is to: pending              Patient currently is not medically stable to d/c.   Difficult to place patient No   Consultants:   Surgery GI  ID   Procedures:   LE [place] Summary:  RIGHT:  - There is no evidence of deep vein thrombosis in the lower extremity.    - No cystic structure found in the popliteal fossa.    LEFT:  - No evidence of common femoral vein obstruction.      Antimicrobials:  Anti-infectives (From admission, onward)   Start     Dose/Rate Route Frequency Ordered Stop   08/12/20 1400  vancomycin (VANCOREADY) IVPB 1250 mg/250 mL        1,250 mg 166.7 mL/hr over 90 Minutes Intravenous Every 24 hours 08/11/20 1429     08/11/20 2200  ceFEPIme (MAXIPIME) 2 g in sodium chloride 0.9 % 100 mL IVPB        2 g 200 mL/hr over 30 Minutes Intravenous Every 8 hours 08/11/20 1429     08/11/20 2200  metroNIDAZOLE (FLAGYL) IVPB 500 mg        500 mg 100 mL/hr over 60 Minutes Intravenous Every 8 hours 08/11/20 2128     08/11/20 1245  ceFEPIme (MAXIPIME) 2 g in sodium chloride 0.9 % 100 mL IVPB        2 g 200 mL/hr over 30 Minutes Intravenous  Once 08/11/20 1236 08/11/20 1426   08/11/20 1245  metroNIDAZOLE (FLAGYL) IVPB 500 mg        500 mg 100 mL/hr over 60 Minutes Intravenous  Once 08/11/20 1236 08/11/20 1517   08/11/20 1245  vancomycin (VANCOREADY) IVPB 1500 mg/300 mL        1,500 mg 150 mL/hr over 120 Minutes Intravenous  Once 08/11/20 1236 08/11/20 1634      Subjective: On bedpan, getting dressing changed No new complaints Asks me to call sister  Objective: Vitals:   08/12/20 2211 08/13/20 0520 08/13/20 0900 08/13/20 1402  BP: 118/61 (!) 118/57  105/61  Pulse: (!) 104 86  91  Resp: 20 20  16   Temp: 99.7 F (37.6 C) (!) 101.3 F (38.5 C)  (!) 97.4 F (36.3 C)  TempSrc: Oral Oral  Oral  SpO2: 99% 100%  100%  Weight:    76.8 kg   Height:   5\' 7"  (1.702 m)     Intake/Output Summary (Last 24 hours) at 08/13/2020 1539 Last data filed at 08/13/2020 1324 Gross per 24 hour  Intake 400 ml  Output 800 ml  Net -400 ml   Filed Weights   08/13/20 0900  Weight: 76.8 kg    Examination:  General exam: Appears calm and comfortable  Respiratory system: Clear to auscultation. Respiratory effort normal. Cardiovascular system: S1 & S2 heard, RRR Gastrointestinal system: Abdomen is nondistended, soft and nontender.  Central nervous system: Alert.   Extremities: no LEE Skin: extensive stage IV decub sacrum    Data Reviewed: I have personally reviewed following labs and imaging studies  CBC: Recent Labs  Lab 08/11/20 1233 08/12/20 0344 08/12/20 1743 08/13/20 0050 08/13/20 1058  WBC 20.0* 15.9*  --  19.2*  --   NEUTROABS 16.2*  --   --   --   --   HGB 9.0* 9.4* 8.7* 8.7* 8.7*  HCT 28.2* 30.0* 27.3* 28.5* 27.2*  MCV 79.0* 81.3  --  81.0  --   PLT 773* 717*  --  792*  --     Basic Metabolic Panel: Recent Labs  Lab 08/11/20 1233 08/12/20 0344 08/13/20 0050 08/13/20 1058  NA 133* 136 137  --   K 4.2 3.4* 3.2*  --   CL 99 106 109  --   CO2 24 19* 17*  --   GLUCOSE 119* 96 94  --   BUN 6 7 7   --   CREATININE 0.52 0.44 0.63  --   CALCIUM 9.3 9.0 8.9  --   MG  --   --   --  1.7    GFR: Estimated Creatinine Clearance: 80.9 mL/min (by C-G formula based on SCr of 0.63 mg/dL).  Liver Function Tests: Recent Labs  Lab 08/11/20 1233  AST 17  ALT 29  ALKPHOS 119  BILITOT 0.5  PROT 5.5*  ALBUMIN 2.0*    CBG: Recent Labs  Lab 08/12/20 1107 08/12/20 1614 08/12/20 2206 08/13/20 0732 08/13/20 1138  GLUCAP 100* 92 182* 108* 85     Recent Results (from the past 240 hour(s))  Blood Culture (routine x 2)     Status: None (Preliminary result)   Collection Time: 08/11/20 12:40 PM   Specimen: BLOOD LEFT HAND  Result Value Ref Range Status   Specimen Description BLOOD LEFT HAND  Final    Special Requests   Final    BOTTLES DRAWN AEROBIC ONLY Blood Culture results may not be optimal due to an inadequate volume of blood received in culture bottles   Culture   Final    NO GROWTH 2 DAYS Performed at [facility], [place] [address], [place], [place] [address]    Report Status PENDING  Incomplete  Blood Culture (routine x 2)     Status: None (Preliminary result)   Collection Time: 08/11/20 12:41 PM   Specimen: BLOOD RIGHT ARM  Result Value Ref Range Status   Specimen Description BLOOD RIGHT ARM  Final   Special Requests   Final    BOTTLES DRAWN AEROBIC AND ANAEROBIC Blood Culture adequate volume   Culture   Final    NO GROWTH 2 DAYS Performed at [facility], [place] [address] [place], [place] [address]    Report Status PENDING  Incomplete  Resp Panel by RT-PCR (Flu A&B, Covid) Nasopharyngeal Swab     Status: None   Collection Time: 08/11/20  1:10 PM   Specimen: Nasopharyngeal Swab; Nasopharyngeal(NP) swabs in vial transport medium  Result Value Ref Range Status   SARS Coronavirus 2 by RT PCR NEGATIVE NEGATIVE Final    Comment: (NOTE) SARS-CoV-2 target nucleic acids are NOT DETECTED.  The SARS-CoV-2 RNA is generally detectable in upper respiratory specimens during the acute phase of infection. The lowest concentration of SARS-CoV-2 viral copies this assay can detect is 138 copies/mL. A negative result does not preclude SARS-Cov-2 infection and should not be used as the sole basis for treatment or other patient management decisions. A negative result may occur with  improper specimen collection/handling, submission of specimen other than nasopharyngeal swab, presence of viral mutation(s) within the areas targeted by this assay,  and inadequate number of viral copies(<138 copies/mL). A negative result must be combined with clinical observations, patient history, and epidemiological information. The expected result is Negative.  Fact Sheet for Patients:   EntrepreneurPulse.com.au  Fact Sheet for Healthcare Providers:  IncredibleEmployment.be  This test is no t yet approved or cleared by the [place] FDA and  has been authorized for detection and/or diagnosis of SARS-CoV-2 by FDA under an Emergency Use Authorization (EUA). This EUA will remain  in effect (meaning this test can be used) for the duration of the COVID-19 declaration under Section 564(b)(1) of the Act, 21 U.S.C.section 360bbb-3(b)(1), unless the authorization is terminated  or revoked sooner.       Influenza A by PCR NEGATIVE NEGATIVE Final   Influenza B by PCR NEGATIVE NEGATIVE Final    Comment: (NOTE) The Xpert Xpress SARS-CoV-2/FLU/RSV plus assay is intended as an aid in the diagnosis of influenza from Nasopharyngeal swab specimens and should not be used as a sole basis for treatment. Nasal washings and aspirates are unacceptable for Xpert Xpress SARS-CoV-2/FLU/RSV testing.  Fact Sheet for Patients: EntrepreneurPulse.com.au  Fact Sheet for Healthcare Providers: IncredibleEmployment.be  This test is not yet approved or cleared by the [place] FDA and has been authorized for detection and/or diagnosis of SARS-CoV-2 by FDA under an Emergency Use Authorization (EUA). This EUA will remain in effect (meaning this test can be used) for the duration of the COVID-19 declaration under Section 564(b)(1) of the Act, 21 U.S.C. section 360bbb-3(b)(1), unless the authorization is terminated or revoked.  Performed at [facility], [place] [address], [place], [place] [address]   MRSA PCR Screening     Status: None   Collection Time: 08/12/20 12:29 PM   Specimen: Nasal Mucosa; Nasopharyngeal  Result Value Ref Range Status   MRSA by PCR NEGATIVE NEGATIVE Final    Comment:        The GeneXpert MRSA Assay (FDA approved for NASAL specimens only), is one component of a comprehensive MRSA  colonization surveillance program. It is not intended to diagnose MRSA infection nor to guide or monitor treatment for MRSA infections. Performed at [facility], [place] [address] [place], [place] [address]   Urine culture     Status: None   Collection Time: 08/12/20  1:34 PM   Specimen: Urine, Catheterized  Result Value Ref Range Status   Specimen Description   Final    URINE, CATHETERIZED Performed at [place] [address], [place], [place] [address]    Special Requests   Final    NONE Performed at [facility], [place] [address] [place], [place] [address]    Culture   Final    NO GROWTH Performed at [facility], [place] [address], [place], [place] [address]    Report Status 08/13/2020 FINAL  Final         Radiology Studies: CT ABDOMEN PELVIS W CONTRAST  Result Date: 08/11/2020 CLINICAL DATA:  Sacral wound EXAM: CT ABDOMEN AND PELVIS WITH CONTRAST TECHNIQUE: Multidetector CT imaging of the abdomen and pelvis was performed using the standard protocol following bolus administration of intravenous contrast. CONTRAST:  176mL OMNIPAQUE IOHEXOL 300 MG/ML  SOLN COMPARISON:  June 16, 2020 FINDINGS: Lower chest: The visualized heart size within normal limits. No pericardial fluid/thickening. No hiatal hernia. The visualized portions of the lungs are clear. Hepatobiliary: The liver is normal in density without focal abnormality.The main portal vein is patent. No evidence of calcified  gallstones, gallbladder wall thickening or biliary dilatation. Pancreas: Unremarkable. No pancreatic ductal dilatation or surrounding inflammatory changes. Spleen: Normal in size without focal abnormality. Adrenals/Urinary Tract: Both adrenal glands appear normal. The kidneys and collecting system appear normal without evidence of urinary tract calculus or hydronephrosis. Bladder is unremarkable. Stomach/Bowel: The stomach and small bowel are  unremarkable. There is a large amount of colonic stool seen throughout. A dilated rectum is seen with a fecal ball. Vascular/Lymphatic: There are no enlarged mesenteric, retroperitoneal, or pelvic lymph nodes. Scattered aortic atherosclerotic calcifications are seen without aneurysmal dilatation. Reproductive: Again noted is a heterogeneous fibroid uterus some with calcifications. Other: No evidence of abdominal wall mass or hernia. Musculoskeletal: There is a large sacral decubitus ulceration to the level of the coccyx with slight periosteal reaction in cortical irregularity at the inferior coccyx as the prior exam which could be due to chronic osteomyelitis. No loculated fluid collection is seen. IMPRESSION: Large sacral decubitus ulceration with cortical irregularity at the inferior coccyx which could be due to chronic osteomyelitis as on prior exam. No loculated fluid collections or evidence of acute osteomyelitis Large amount of colonic stool with a large fecal ball within the rectum. Aortic Atherosclerosis (ICD10-I70.0). Electronically Signed   By: Prudencio Pair M.D.   On: 08/11/2020 19:31   VAS [place] LOWER EXTREMITY VENOUS (DVT)  Result Date: 08/12/2020  Lower Venous DVT Study Indications: Swelling.  Risk Factors: None identified. Comparison Study: No prior studies. Performing Technologist: Oliver Hum RVT  Examination Guidelines: A complete evaluation includes B-mode imaging, spectral Doppler, color Doppler, and power Doppler as needed of all accessible portions of each vessel. Bilateral testing is considered an integral part of a complete examination. Limited examinations for reoccurring indications may be performed as noted. The reflux portion of the exam is performed with the patient in reverse Trendelenburg.  +---------+---------------+---------+-----------+----------+--------------+ RIGHT    CompressibilityPhasicitySpontaneityPropertiesThrombus Aging  +---------+---------------+---------+-----------+----------+--------------+ CFV      Full           Yes      Yes                                 +---------+---------------+---------+-----------+----------+--------------+ SFJ      Full                                                        +---------+---------------+---------+-----------+----------+--------------+ FV Prox  Full                                                        +---------+---------------+---------+-----------+----------+--------------+ FV Mid   Full                                                        +---------+---------------+---------+-----------+----------+--------------+ FV DistalFull                                                        +---------+---------------+---------+-----------+----------+--------------+  PFV      Full                                                        +---------+---------------+---------+-----------+----------+--------------+ POP      Full           Yes      Yes                                 +---------+---------------+---------+-----------+----------+--------------+ PTV      Full                                                        +---------+---------------+---------+-----------+----------+--------------+ PERO     Full                                                        +---------+---------------+---------+-----------+----------+--------------+   +----+---------------+---------+-----------+----------+--------------+ LEFTCompressibilityPhasicitySpontaneityPropertiesThrombus Aging +----+---------------+---------+-----------+----------+--------------+ CFV Full           Yes      Yes                                 +----+---------------+---------+-----------+----------+--------------+     Summary: RIGHT: - There is no evidence of deep vein thrombosis in the lower extremity.  - No cystic structure found in the popliteal fossa.   LEFT: - No evidence of common femoral vein obstruction.  *See table(s) above for measurements and observations. Electronically signed by Deitra Mayo MD on 08/12/2020 at 3:48:32 PM.    Final         Scheduled Meds: . insulin aspart  0-5 Units Subcutaneous QHS  . insulin aspart  0-9 Units Subcutaneous TID WC  . metoprolol succinate  100 mg Oral Daily  . nicotine  21 mg Transdermal Daily  . pantoprazole (PROTONIX) IV  40 mg Intravenous Q12H  . polyethylene glycol  17 g Oral BID  . senna-docusate  1 tablet Oral QHS  . sorbitol, milk of mag, mineral oil, glycerin (SMOG) enema  960 mL Rectal BID   Continuous Infusions: . ceFEPime (MAXIPIME) IV 2 g (08/13/20 1321)  . metronidazole 500 mg (08/13/20 1415)  . vancomycin 1,250 mg (08/13/20 1523)     LOS: 2 days    Time spent: over 30 min    Fayrene Helper, MD [facility]   To contact the attending provider between 7A-7P or the covering provider during after hours 7P-7A, please log into the web site [email or web address] and access using universal [place] password for that web site. If you do not have the password, please call the hospital operator.  08/13/2020, 3:39 PM

## 2020-08-13 NOTE — Progress Notes (Signed)
[facility] for Infectious Disease    Date of Admission:  08/11/2020   Total days of antibiotics 3           ID: Tina Griffith is a 60 y.o. female with  Principal Problem:   Sepsis ([place]) Active Problems:   Constipation   Tobacco abuse   Anemia   AMS (altered mental status)   Diabetes ([place])   Fecal impaction (HCC)   Dark stools    Subjective: Had fever last night, for which she states she has them daily. Also underwent enema to help with disimpaction. Getting set for EGD tomorrow  Medications:  . insulin aspart  0-5 Units Subcutaneous QHS  . insulin aspart  0-9 Units Subcutaneous TID WC  . metoprolol succinate  100 mg Oral Daily  . nicotine  21 mg Transdermal Daily  . nystatin  5 mL Oral QID  . pantoprazole (PROTONIX) IV  40 mg Intravenous Q12H  . polyethylene glycol  17 g Oral BID  . senna-docusate  1 tablet Oral QHS  . sorbitol, milk of mag, mineral oil, glycerin (SMOG) enema  960 mL Rectal BID    Objective: Vital signs in last 24 hours: Temp:  [97.4 F (36.3 C)-101.3 F (38.5 C)] 97.4 F (36.3 C) (03/23 1402) Pulse Rate:  [86-117] 91 (03/23 1402) Resp:  [16-20] 16 (03/23 1402) BP: (105-136)/(57-70) 105/61 (03/23 1402) SpO2:  [99 %-100 %] 100 % (03/23 1402) Weight:  [76.8 kg] 76.8 kg (03/23 0900) Physical Exam  Constitutional:  oriented to person, place.Marland Kitchen appears chronically ill and well-nourished. No distress.  HENT: [place]/AT, PERRLA, no scleral icterus Mouth/Throat: Oropharynx is clear and moist. No oropharyngeal exudate.  Cardiovascular: Normal rate, regular rhythm and normal heart sounds. Exam reveals no gallop and no friction rub.  No murmur heard.  Pulmonary/Chest: Effort normal and breath sounds normal. No respiratory distress.  has no wheezes.  Neck = supple, no nuchal rigidity Abdominal: Soft. Bowel sounds are normal.  exhibits no distension. There is no tenderness.  Lymphadenopathy: no cervical adenopathy. No axillary adenopathy Neurological:  alert and oriented to person, place, and time. Limited motion of hands, and legs 2/2 MS Skin: Skin is warm and dry. No rash noted. No erythema.  Psychiatric: a normal mood and affect.  behavior is normal.    Lab Results Recent Labs    08/12/20 0344 08/12/20 1743 08/13/20 0050 08/13/20 1058  WBC 15.9*  --  19.2*  --   HGB 9.4*   < > 8.7* 8.7*  HCT 30.0*   < > 28.5* 27.2*  NA 136  --  137  --   K 3.4*  --  3.2*  --   CL 106  --  109  --   CO2 19*  --  17*  --   BUN 7  --  7  --   CREATININE 0.44  --  0.63  --    < > = values in this interval not displayed.   Liver Panel Recent Labs    08/11/20 1233  PROT 5.5*  ALBUMIN 2.0*  AST 17  ALT 29  ALKPHOS 119  BILITOT 0.5   Sedimentation Rate Recent Labs    08/13/20 0050  ESRSEDRATE 65*   C-Reactive Protein Recent Labs    08/13/20 0050  CRP 15.4*    Microbiology: reviewed Studies/Results: CT ABDOMEN PELVIS W CONTRAST  Result Date: 08/11/2020 CLINICAL DATA:  Sacral wound EXAM: CT ABDOMEN AND PELVIS WITH CONTRAST TECHNIQUE: Multidetector CT imaging of  the abdomen and pelvis was performed using the standard protocol following bolus administration of intravenous contrast. CONTRAST:  128mL OMNIPAQUE IOHEXOL 300 MG/ML  SOLN COMPARISON:  June 16, 2020 FINDINGS: Lower chest: The visualized heart size within normal limits. No pericardial fluid/thickening. No hiatal hernia. The visualized portions of the lungs are clear. Hepatobiliary: The liver is normal in density without focal abnormality.The main portal vein is patent. No evidence of calcified gallstones, gallbladder wall thickening or biliary dilatation. Pancreas: Unremarkable. No pancreatic ductal dilatation or surrounding inflammatory changes. Spleen: Normal in size without focal abnormality. Adrenals/Urinary Tract: Both adrenal glands appear normal. The kidneys and collecting system appear normal without evidence of urinary tract calculus or hydronephrosis. Bladder is  unremarkable. Stomach/Bowel: The stomach and small bowel are unremarkable. There is a large amount of colonic stool seen throughout. A dilated rectum is seen with a fecal ball. Vascular/Lymphatic: There are no enlarged mesenteric, retroperitoneal, or pelvic lymph nodes. Scattered aortic atherosclerotic calcifications are seen without aneurysmal dilatation. Reproductive: Again noted is a heterogeneous fibroid uterus some with calcifications. Other: No evidence of abdominal wall mass or hernia. Musculoskeletal: There is a large sacral decubitus ulceration to the level of the coccyx with slight periosteal reaction in cortical irregularity at the inferior coccyx as the prior exam which could be due to chronic osteomyelitis. No loculated fluid collection is seen. IMPRESSION: Large sacral decubitus ulceration with cortical irregularity at the inferior coccyx which could be due to chronic osteomyelitis as on prior exam. No loculated fluid collections or evidence of acute osteomyelitis Large amount of colonic stool with a large fecal ball within the rectum. Aortic Atherosclerosis (ICD10-I70.0). Electronically Signed   By: Prudencio Pair M.D.   On: 08/11/2020 19:31   VAS [place] LOWER EXTREMITY VENOUS (DVT)  Result Date: 08/12/2020  Lower Venous DVT Study Indications: Swelling.  Risk Factors: None identified. Comparison Study: No prior studies. Performing Technologist: Oliver Hum RVT  Examination Guidelines: A complete evaluation includes B-mode imaging, spectral Doppler, color Doppler, and power Doppler as needed of all accessible portions of each vessel. Bilateral testing is considered an integral part of a complete examination. Limited examinations for reoccurring indications may be performed as noted. The reflux portion of the exam is performed with the patient in reverse Trendelenburg.  +---------+---------------+---------+-----------+----------+--------------+ RIGHT     CompressibilityPhasicitySpontaneityPropertiesThrombus Aging +---------+---------------+---------+-----------+----------+--------------+ CFV      Full           Yes      Yes                                 +---------+---------------+---------+-----------+----------+--------------+ SFJ      Full                                                        +---------+---------------+---------+-----------+----------+--------------+ FV Prox  Full                                                        +---------+---------------+---------+-----------+----------+--------------+ FV Mid   Full                                                        +---------+---------------+---------+-----------+----------+--------------+  FV DistalFull                                                        +---------+---------------+---------+-----------+----------+--------------+ PFV      Full                                                        +---------+---------------+---------+-----------+----------+--------------+ POP      Full           Yes      Yes                                 +---------+---------------+---------+-----------+----------+--------------+ PTV      Full                                                        +---------+---------------+---------+-----------+----------+--------------+ PERO     Full                                                        +---------+---------------+---------+-----------+----------+--------------+   +----+---------------+---------+-----------+----------+--------------+ LEFTCompressibilityPhasicitySpontaneityPropertiesThrombus Aging +----+---------------+---------+-----------+----------+--------------+ CFV Full           Yes      Yes                                 +----+---------------+---------+-----------+----------+--------------+     Summary: RIGHT: - There is no evidence of deep vein thrombosis in the lower  extremity.  - No cystic structure found in the popliteal fossa.  LEFT: - No evidence of common femoral vein obstruction.  *See table(s) above for measurements and observations. Electronically signed by Deitra Mayo MD on 08/12/2020 at 3:48:32 PM.    Final      Assessment/Plan: Chronic sacral osteomyelitis = currently on broad spectrum abtx. Will plan to continue with current wound care. Will plan on narrow abtx tomorrow to see if fevers difervesce and improvement with leukocytosis  Try to off load. Differ to wound care when she will be candidate for wound [facility] for Infectious Diseases Cell: [phone] Pager: [phone]  08/13/2020, 5:17 PM

## 2020-08-13 NOTE — TOC Initial Note (Signed)
Transition of Care [facility]) - Initial/Assessment Note    Patient Details  Name: Tina Griffith MRN: [id number] Date of Birth: [birth year]  Transition of Care [facility]) CM/SW Contact:    [name], RN Phone Number: 08/13/2020, 9:39 AM  Clinical Narrative:  Spoke to dtr [place]-Patient from [facility], does not want to return. Per [facility]-max asst,w/c bound,hoyer lift,max asst w/adl's.Will fax out for LTC.                 Expected Discharge Plan: Long Term Nursing Home Barriers to Discharge: Continued Medical Work up   Patient Goals and CMS Choice Patient states their goals for this hospitalization and ongoing recovery are:: Per dtr do not want to return back to [place] a LTC at another facility. CMS Medicare.gov Compare Post Acute Care list provided to:: Patient Represenative (must comment) (dtr [facility] [phone])    Expected Discharge Plan and Services Expected Discharge Plan: [place]   Discharge Planning Services: CM Consult   Living arrangements for the past 2 months: [place]                                      Prior Living Arrangements/Services Living arrangements for the past 2 months: [place] Lives with:: Facility Resident Patient language and need for interpreter reviewed:: Yes Do you feel safe going back to the place where you live?: No   prefer a ddifferent facility.  Need for Family Participation in Patient Care: No (Comment) Care giver support system in place?: Yes (comment) Current home services:  (w/c,hoyer lift,max asst.) Criminal Activity/Legal Involvement Pertinent to Current Situation/Hospitalization: No - Comment as needed  Activities of Daily Living Home Assistive Devices/Equipment: Blood pressure cuff,Eyeglasses,Grab bars around toilet,Grab bars in shower,Hand-held shower hose,Hospital bed,Hoyer Lift,Scales,Wheelchair ADL Screening (condition at time of admission) Patient's cognitive  ability adequate to safely complete daily activities?: Yes Is the patient deaf or have difficulty hearing?: No Does the patient have difficulty seeing, even when wearing glasses/contacts?: No Does the patient have difficulty concentrating, remembering, or making decisions?: No Patient able to express need for assistance with ADLs?: Yes Does the patient have difficulty dressing or bathing?: Yes Independently performs ADLs?: No Communication: Independent Dressing (OT): Dependent Is this a change from baseline?: Pre-admission baseline Grooming: Dependent Is this a change from baseline?: Pre-admission baseline Feeding: Dependent Is this a change from baseline?: Pre-admission baseline Bathing: Dependent Is this a change from baseline?: Pre-admission baseline Toileting: Dependent Is this a change from baseline?: Pre-admission baseline In/Out Bed: Dependent Is this a change from baseline?: Pre-admission baseline Walks in Home: Dependent Is this a change from baseline?: Pre-admission baseline Does the patient have difficulty walking or climbing stairs?: Yes (secondary to MS) Weakness of Legs: Both Weakness of Arms/Hands: Both  Permission Sought/Granted Permission sought to share information with : Case Manager Permission granted to share information with : Yes, Verbal Permission Granted  Share Information with NAME: Case Manager           Emotional Assessment Appearance:: Appears stated age Attitude/Demeanor/Rapport: Gracious Affect (typically observed): Accepting Orientation: : Oriented to Self,Oriented to Place,Oriented to Situation Alcohol / Substance Use: Not Applicable Psych Involvement: No (comment)  Admission diagnosis:  Sepsis ([place]) [A41.9] Fever, unspecified fever cause [R50.9] Patient Active Problem List   Diagnosis Date Noted  . Fecal impaction ([place])   . Dark stools   . Sepsis ([place]) 08/11/2020  . AMS (  altered mental status) 08/11/2020  . Diabetes ([place]) 08/11/2020   . Fever 06/26/2020  . Hypoxia   . Impaction of colon ([place])   . Anemia   . Malnutrition of moderate degree 06/19/2020  . Wound infection 06/17/2020  . Leucocytosis 06/16/2020  . Sepsis with acute organ dysfunction without septic shock ([place]) 06/16/2020  . UTI (urinary tract infection) 06/16/2020  . Depression with anxiety 04/20/2016  . Insomnia 04/20/2016  . Chronic prescription opiate use 01/16/2016  . Hypertriglyceridemia 06/20/2015  . Abnormal urine odor 10/09/2014  . Chronic kidney disease 10/09/2014  . Elevated WBC count 10/09/2014  . Multiple sclerosis ([place]) 07/25/2014  . Spastic gait 07/25/2014  . Leg pain, left 07/25/2014  . Urinary frequency 07/25/2014  . Constipation 07/25/2014  . Decreased potassium in the blood 04/22/2014  . Hypomagnesemia 04/22/2014  . Ataxic gait 02/28/2014  . Callosity 02/28/2014  . 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 Contracture of ankle and foot joint 02/28/2014  . Decubital ulcer 02/28/2014  . Fibroid 02/28/2014  . Dysfunctional or functional uterine hemorrhage 02/28/2014  . Incisional hernia with obstruction but no gangrene 02/28/2014  . Gonalgia 02/28/2014  . Extremity pain 02/28/2014  . Decreased motor strength 02/28/2014  . Non-pressure ulcer of lower extremity ([place]) 02/28/2014  . Loss of feeling or sensation 02/28/2014  . Anterior optic neuritis 02/28/2014  . Hemorrhage, postmenopausal 02/28/2014  . Arthritis of knee, degenerative 02/28/2014  . AS (sickle cell trait) ([place]) 02/28/2014  . 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 Sickle cell trait ([place]) 02/28/2014  . 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 Abnormal results of thyroid function studies 12/28/2013  . Allergic rhinitis 08/22/2013  . Disorder of magnesium metabolism 08/22/2013  . Essential (primary) hypertension 08/22/2013  . Hypercholesteremia 08/22/2013  . Adult hypothyroidism 08/22/2013  . Anemia, iron deficiency 08/22/2013  . Compulsive tobacco user  syndrome 08/22/2013  . Absence of bladder continence 08/22/2013  . Hypercholesterolemia 08/22/2013  . Tobacco abuse 08/22/2013  . Accumulation of fluid in tissues 11/29/2012  . Dermatophytic onychia 10/28/2012  . Colon, diverticulosis 10/28/2012  . Polypharmacy 10/28/2012  . DS (disseminated sclerosis) ([place]) 10/28/2012  . Arthralgia of lower leg 10/28/2012  . Menopausal symptom 10/28/2012  . Current tear of lateral cartilage or meniscus of knee 10/28/2012  . Diverticular disease of large intestine 10/28/2012  . Other long term (current) drug therapy 10/28/2012  . Abnormal uterine bleeding 06/26/2012  . Baseball finger 08/03/2010   PCP:  [facility] [place]:   [place], [id number] [place] [address] Phone: [phone] Fax: [place], [place], [address] [place], [address] [place] [address] Phone: [phone] Fax: [id number] - [place], [place] [address] Phone: [phone] Fax: [phone]  [facility] - [place], [place] [address] [place] [address] Phone: [phone] Fax: [phone]  [facility] [place], [place] - [address] [place] [address] Phone: [phone] Fax: [phone]     Social Determinants of Health (SDOH) Interventions    Readmission Risk Interventions Readmission Risk Prevention Plan 06/18/2020  Transportation Screening Complete  PCP or Specialist Appt within 5-7 Days Complete  Home Care Screening Complete  Medication  Review (RN CM) Complete  Some recent data might be hidden

## 2020-08-13 NOTE — Plan of Care (Signed)

## 2020-08-14 ENCOUNTER — Encounter (HOSPITAL_COMMUNITY)
Admission: EM | Disposition: A | Discharge status: Skilled Nursing Facility | Payer: Self-pay | Source: Home / Self Care | Attending: Internal Medicine

## 2020-08-14 DIAGNOSIS — R195 Other fecal abnormalities: Secondary | ICD-10-CM | POA: Diagnosis not present

## 2020-08-14 DIAGNOSIS — M4628 Osteomyelitis of vertebra, sacral and sacrococcygeal region: Secondary | ICD-10-CM | POA: Diagnosis not present

## 2020-08-14 DIAGNOSIS — K5641 Fecal impaction: Secondary | ICD-10-CM | POA: Diagnosis not present

## 2020-08-14 DIAGNOSIS — K5909 Other constipation: Secondary | ICD-10-CM | POA: Diagnosis not present

## 2020-08-14 DIAGNOSIS — R509 Fever, unspecified: Secondary | ICD-10-CM | POA: Diagnosis not present

## 2020-08-14 DIAGNOSIS — D649 Anemia, unspecified: Secondary | ICD-10-CM | POA: Diagnosis not present

## 2020-08-14 DIAGNOSIS — K5900 Constipation, unspecified: Secondary | ICD-10-CM | POA: Diagnosis not present

## 2020-08-14 DIAGNOSIS — A419 Sepsis, unspecified organism: Secondary | ICD-10-CM | POA: Diagnosis not present

## 2020-08-14 LAB — COMPREHENSIVE METABOLIC PANEL
ALT: 18 U/L (ref 0–44)
AST: 10 U/L — ABNORMAL LOW (ref 15–41)
Albumin: 2.2 g/dL — ABNORMAL LOW (ref 3.5–5.0)
Alkaline Phosphatase: 93 U/L (ref 38–126)
Anion gap: 10 (ref 5–15)
BUN: 8 mg/dL (ref 6–20)
CO2: 16 mmol/L — ABNORMAL LOW (ref 22–32)
Calcium: 8.6 mg/dL — ABNORMAL LOW (ref 8.9–10.3)
Chloride: 108 mmol/L (ref 98–111)
Creatinine, Ser: 0.4 mg/dL — ABNORMAL LOW (ref 0.44–1.00)
GFR, Estimated: >60 mL/min (ref >60)
Glucose, Bld: 96 mg/dL (ref 70–99)
Potassium: 3.7 mmol/L (ref 3.5–5.1)
Sodium: 134 mmol/L — ABNORMAL LOW (ref 135–145)
Total Bilirubin: 0.7 mg/dL (ref 0.3–1.2)
Total Protein: 5.2 g/dL — ABNORMAL LOW (ref 6.5–8.1)

## 2020-08-14 LAB — CBC WITH DIFFERENTIAL/PLATELET
Abs Immature Granulocytes: 0.59 10*3/uL — ABNORMAL HIGH (ref 0.00–0.07)
Basophils Absolute: 0.1 10*3/uL (ref 0.0–0.1)
Basophils Relative: 1 %
Eosinophils Absolute: 0.1 10*3/uL (ref 0.0–0.5)
Eosinophils Relative: 1 %
HCT: 26.9 % — ABNORMAL LOW (ref 36.0–46.0)
Hemoglobin: 8.5 g/dL — ABNORMAL LOW (ref 12.0–15.0)
Immature Granulocytes: 3 %
Lymphocytes Relative: 9 %
Lymphs Abs: 1.7 10*3/uL (ref 0.7–4.0)
MCH: 25.2 pg — ABNORMAL LOW (ref 26.0–34.0)
MCHC: 31.6 g/dL (ref 30.0–36.0)
MCV: 79.8 fL — ABNORMAL LOW (ref 80.0–100.0)
Monocytes Absolute: 1.2 10*3/uL — ABNORMAL HIGH (ref 0.1–1.0)
Monocytes Relative: 6 %
Neutro Abs: 15.7 10*3/uL — ABNORMAL HIGH (ref 1.7–7.7)
Neutrophils Relative %: 80 %
Platelets: 751 10*3/uL — ABNORMAL HIGH (ref 150–400)
RBC: 3.37 MIL/uL — ABNORMAL LOW (ref 3.87–5.11)
RDW: 19.4 % — ABNORMAL HIGH (ref 11.5–15.5)
WBC: 19.4 10*3/uL — ABNORMAL HIGH (ref 4.0–10.5)
nRBC: 0.2 % (ref 0.0–0.2)

## 2020-08-14 LAB — IRON AND TIBC
Iron: 21 ug/dL — ABNORMAL LOW (ref 28–170)
Saturation Ratios: 15 % (ref 10.4–31.8)
TIBC: 137 ug/dL — ABNORMAL LOW (ref 250–450)
UIBC: 116 ug/dL

## 2020-08-14 LAB — FOLATE: Folate: 17.8 ng/mL (ref >5.9)

## 2020-08-14 LAB — GLUCOSE, CAPILLARY
Glucose-Capillary: 100 mg/dL — ABNORMAL HIGH (ref 70–99)
Glucose-Capillary: 89 mg/dL (ref 70–99)
Glucose-Capillary: 138 mg/dL — ABNORMAL HIGH (ref 70–99)
Glucose-Capillary: 171 mg/dL — ABNORMAL HIGH (ref 70–99)

## 2020-08-14 LAB — FERRITIN: Ferritin: 276 ng/mL (ref 11–307)

## 2020-08-14 LAB — TSH: TSH: 0.828 u[IU]/mL (ref 0.350–4.500)

## 2020-08-14 LAB — HEMOGLOBIN AND HEMATOCRIT, BLOOD
HCT: 28 % — ABNORMAL LOW (ref 36.0–46.0)
HCT: 27.4 % — ABNORMAL LOW (ref 36.0–46.0)
Hemoglobin: 8.9 g/dL — ABNORMAL LOW (ref 12.0–15.0)
Hemoglobin: 8.7 g/dL — ABNORMAL LOW (ref 12.0–15.0)

## 2020-08-14 LAB — VITAMIN B12: Vitamin B-12: 708 pg/mL (ref 180–914)

## 2020-08-14 LAB — URINE CULTURE: Culture: NO GROWTH

## 2020-08-14 LAB — PHOSPHORUS: Phosphorus: 1.9 mg/dL — ABNORMAL LOW (ref 2.5–4.6)

## 2020-08-14 LAB — MAGNESIUM: Magnesium: 1.6 mg/dL — ABNORMAL LOW (ref 1.7–2.4)

## 2020-08-14 SURGERY — CANCELLED PROCEDURE

## 2020-08-14 MED ORDER — SODIUM CHLORIDE 0.9 % IV SOLN
3.0000 g | Freq: Four times a day (QID) | INTRAVENOUS | Status: DC
  Administered 2020-08-14 – 2020-08-16 (×7): 3 g via INTRAVENOUS
  Filled 2020-08-14: qty 8
  Filled 2020-08-14: qty 3
  Filled 2020-08-14 (×2): qty 8
  Filled 2020-08-14: qty 3
  Filled 2020-08-14: qty 8
  Filled 2020-08-14: qty 3
  Filled 2020-08-14: qty 8

## 2020-08-14 MED ORDER — DOXYCYCLINE HYCLATE 100 MG PO TABS
100.0000 mg | ORAL_TABLET | Freq: Two times a day (BID) | ORAL | Status: DC
  Administered 2020-08-14 – 2020-08-16 (×4): 100 mg via ORAL
  Filled 2020-08-14 (×4): qty 1

## 2020-08-14 MED ORDER — ASCORBIC ACID 500 MG PO TABS
500.0000 mg | ORAL_TABLET | Freq: Every day | ORAL | Status: DC
  Administered 2020-08-14 – 2020-09-02 (×20): 500 mg via ORAL
  Filled 2020-08-14 (×20): qty 1

## 2020-08-14 MED ORDER — K PHOS MONO-SOD PHOS DI & MONO 155-852-130 MG PO TABS
500.0000 mg | ORAL_TABLET | Freq: Four times a day (QID) | ORAL | Status: AC
  Administered 2020-08-14 (×3): 500 mg via ORAL
  Filled 2020-08-14 (×4): qty 2

## 2020-08-14 MED ORDER — ZINC SULFATE 220 (50 ZN) MG PO CAPS
220.0000 mg | ORAL_CAPSULE | Freq: Every day | ORAL | Status: DC
  Administered 2020-08-14 – 2020-09-02 (×20): 220 mg via ORAL
  Filled 2020-08-14 (×20): qty 1

## 2020-08-14 MED ORDER — JUVEN PO PACK
1.0000 | PACK | Freq: Two times a day (BID) | ORAL | Status: DC
  Administered 2020-08-15 – 2020-09-02 (×21): 1 via ORAL
  Filled 2020-08-14 (×38): qty 1

## 2020-08-14 MED ORDER — MAGNESIUM SULFATE 2 GM/50ML IV SOLN
2.0000 g | Freq: Once | INTRAVENOUS | Status: AC
  Administered 2020-08-14: 2 g via INTRAVENOUS
  Filled 2020-08-14: qty 50

## 2020-08-14 MED ORDER — ENSURE ENLIVE PO LIQD
237.0000 mL | Freq: Two times a day (BID) | ORAL | Status: DC
  Administered 2020-08-14 – 2020-08-20 (×9): 237 mL via ORAL

## 2020-08-14 SURGICAL SUPPLY — 14 items

## 2020-08-14 NOTE — Progress Notes (Signed)
[place] [facility] for Infectious Disease    Date of Admission:  08/11/2020   Total days of antibiotics 4           ID: Tina Griffith is a 60 y.o. female with   Principal Problem:   Sepsis ([place]) Active Problems:   Constipation   Tobacco abuse   Anemia   AMS (altered mental status)   Diabetes ([place])   Fecal impaction (HCC)   Dark stools    Subjective: Patient is afebrile, feeling better per her report  Medications:  . vitamin C  500 mg Oral Daily  . dalfampridine  10 mg Oral BID  . doxycycline  100 mg Oral Q12H  . feeding supplement  237 mL Oral BID BM  . insulin aspart  0-5 Units Subcutaneous QHS  . insulin aspart  0-9 Units Subcutaneous TID WC  . metoprolol succinate  100 mg Oral Daily  . mometasone-formoterol  2 puff Inhalation BID  . nicotine  21 mg Transdermal Daily  . [START ON 08/15/2020] nutrition supplement (JUVEN)  1 packet Oral BID BM  . nystatin  5 mL Oral QID  . oxybutynin  10 mg Oral Daily  . pantoprazole (PROTONIX) IV  40 mg Intravenous Q12H  . phosphorus  500 mg Oral QID  . polyethylene glycol  17 g Oral BID  . senna-docusate  1 tablet Oral QHS  . sorbitol, milk of mag, mineral oil, glycerin (SMOG) enema  960 mL Rectal BID  . zinc sulfate  220 mg Oral Daily    Objective: Vital signs in last 24 hours: Temp:  [97.7 F (36.5 C)-99 F (37.2 C)] 98.5 F (36.9 C) (03/24 1434) Pulse Rate:  [83-114] 83 (03/24 1434) Resp:  [18-20] 18 (03/24 1434) BP: (103-124)/(58-73) 103/58 (03/24 1434) SpO2:  [96 %-100 %] 100 % (03/24 1434) Physical Exam  Constitutional:  oriented to person, place. appears well-developed and well-nourished. No distress.  HENT: [place]/AT, PERRLA, no scleral icterus Mouth/Throat: Oropharynx is clear and moist. No oropharyngeal exudate.  Cardiovascular: Normal rate, regular rhythm and normal heart sounds. Exam reveals no gallop and no friction rub.  No murmur heard.  Pulmonary/Chest: Effort normal and breath sounds normal. No respiratory  distress.  has no wheezes.  Neck = supple, no nuchal rigidity Abdominal: Soft. Bowel sounds are normal.  exhibits no distension. There is no tenderness.  Lymphadenopathy: no cervical adenopathy. No axillary adenopathy Neurological: alert and oriented to person, place. Limited strength to upper and lower extremity (due to MS) Skin: Skin is warm and dry. No rash noted. No erythema.  Psychiatric: a normal mood and affect.  behavior is normal.    Lab Results Recent Labs    08/13/20 0050 08/13/20 1058 08/14/20 0102 08/14/20 0933  WBC 19.2*  --  19.4*  --   HGB 8.7*   < > 8.5* 8.9*  HCT 28.5*   < > 26.9* 28.0*  NA 137  --  134*  --   K 3.2*  --  3.7  --   CL 109  --  108  --   CO2 17*  --  16*  --   BUN 7  --  8  --   CREATININE 0.63  --  0.40*  --    < > = values in this interval not displayed.   Liver Panel Recent Labs    08/14/20 0102  PROT 5.2*  ALBUMIN 2.2*  AST 10*  ALT 18  ALKPHOS 93  BILITOT 0.7  Sedimentation Rate Recent Labs    08/13/20 0050  ESRSEDRATE 65*   C-Reactive Protein Recent Labs    08/13/20 0050  CRP 15.4*    Microbiology: reviewed Studies/Results: No results found.   Assessment/Plan: Sacral osteomyelitis = will stop vancomycin and convert to doxycycline 100mg  po bid with food plus amp/sub to treat for 4 wk. Then convert to oral regimen  Continue with wound care recs  Will sign off and do follow telephone visit at SNF at discharge  [facility] for Infectious Diseases Cell: [phone] Pager: [phone]  08/14/2020, 4:32 PM

## 2020-08-14 NOTE — Progress Notes (Signed)
Progress Note   Subjective  Patient brought down to endoscopy today. Consent was obtained from the family who wanted the patient to procedure with upper endoscopy. The patient in endo adamantly refused EGD and states she did not want any invasive procedures, despite what her family wanted her to have done, so procedure cancelled.    Objective   Vital signs in last 24 hours: Temp:  [97.4 F (36.3 C)-99 F (37.2 C)] 97.7 F (36.5 C) (03/24 0505) Pulse Rate:  [91-114] 104 (03/24 0505) Resp:  [16-20] 18 (03/24 0505) BP: (105-124)/(61-73) 116/62 (03/24 0505) SpO2:  [96 %-100 %] 96 % (03/24 0858) Last BM Date: 08/13/20 General:    AA female in NAD Abdomen:  Soft, nontender   Intake/Output from previous day: 03/23 0701 - 03/24 0700 In: 970 [P.O.:120; IV Piggyback:850] Out: 800 [Urine:800] Intake/Output this shift: Total I/O In: 1150 [IV Piggyback:1150] Out: 650 [Urine:650]  Lab Results: Recent Labs    08/12/20 0344 08/12/20 1743 08/13/20 0050 08/13/20 1058 08/13/20 1655 08/14/20 0102 08/14/20 0933  WBC 15.9*  --  19.2*  --   --  19.4*  --   HGB 9.4*   < > 8.7*   < > 8.5* 8.5* 8.9*  HCT 30.0*   < > 28.5*   < > 27.1* 26.9* 28.0*  PLT 717*  --  792*  --   --  751*  --    < > = values in this interval not displayed.   BMET Recent Labs    08/12/20 0344 08/13/20 0050 08/14/20 0102  NA 136 137 134*  K 3.4* 3.2* 3.7  CL 106 109 108  CO2 19* 17* 16*  GLUCOSE 96 94 96  BUN 7 7 8   CREATININE 0.44 0.63 0.40*  CALCIUM 9.0 8.9 8.6*   LFT Recent Labs    08/14/20 0102  PROT 5.2*  ALBUMIN 2.2*  AST 10*  ALT 18  ALKPHOS 93  BILITOT 0.7   PT/INR No results for input(s): LABPROT, INR in the last 72 hours.  Studies/Results: No results found.     Assessment / Plan:    60 y/o female patient with multiple medical problems to include MS, movement disorder, worsening functional status following COVID19 in January, development of sacral decubitus ulcer and now  with suspected osteomyelitis.   Has passed some dark stools per nursing staff and with stable persistent anemia. However with enemas the stool has been hard, nursing staff has disimpacted the patient and given SMOG enemas BID which is producing stool. We offered an EGD to clear her upper tract in light of anemia and dark stools. Family had consented for the patient and wants patient to proceed with it, however the patient this AM adamantly declined EGD, refusing invasive procedures. I discussed rational for this, risks / benefits, she continues to decline and states she understands why it is recommended.   Her Hgb is stable, no overt bleeding. BUN is normal. Don't think she is having any significant upper GI bleeding, would cover with empiric PPI if she is declining endoscopy. Would continue with SMOG enemas and disimpaction PRN.   Plan: - patient is declining EGD, while family wishes to pursue it. No plans for EGD right now as the patient clearly voiced she does not want this procedure based on my discussion with her today. She is stable from bleeding perspective right now. Would have meeting with family / patient regarding goals of care, we are standing by to assist  if needed - continue empiric IV protonix - continue BID SMOG enemas, disimpaction if needed    We will sign off for now, call with questions moving forward.  Rhine Cellar, MD [facility]
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
## 2020-08-14 NOTE — Plan of Care (Signed)

## 2020-08-14 NOTE — Progress Notes (Signed)
Spoke with Georgiana Shore (sister) regarding not being able to complete EGD today. Crystal was upset that patient did not get procedure done due to current status of patient needing procedure. Crystal states that she is currently the decision maker due to patients daughter being a patient in [facility]. Crystal stated that patients MS has progressed to a point where patients orientation status goes back and forth. Crystal is wanting family to be present prior to procedure to support patients orientation status in order to successfully completed.

## 2020-08-14 NOTE — Plan of Care (Signed)
  Problem: Education: Goal: Knowledge of General Education information will improve Description: Including pain rating scale, medication(s)/side effects and non-pharmacologic comfort measures Outcome: Progressing   Problem: Clinical Measurements: Goal: Ability to maintain clinical measurements within normal limits will improve Outcome: Progressing Goal: Will remain free from infection Outcome: Progressing Goal: Diagnostic test results will improve Outcome: Progressing Goal: Respiratory complications will improve Outcome: Progressing Goal: Cardiovascular complication will be avoided Outcome: Progressing   Problem: Coping: Goal: Level of anxiety will decrease Outcome: Progressing   Problem: Elimination: Goal: Will not experience complications related to bowel motility Outcome: Progressing Goal: Will not experience complications related to urinary retention Outcome: Progressing   Problem: Pain Managment: Goal: General experience of comfort will improve Outcome: Progressing   Problem: Safety: Goal: Ability to remain free from injury will improve Outcome: Progressing   Problem: Skin Integrity: Goal: Risk for impaired skin integrity will decrease Outcome: Progressing

## 2020-08-14 NOTE — Progress Notes (Signed)
Per on-call physician it is OK to give pt Norco at this time for wound care. Pt will resume NPO status after medication administered.
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
## 2020-08-14 NOTE — Evaluation (Signed)
Physical Therapy Evaluation Patient Details Name: Tina Griffith MRN: [id number] DOB: 1961-04-20 Today's Date: 08/14/2020   History of Present Illness  Patient is 60 y.o. female with PMH significant for MS, Lt hemiplegia, HTN, and diagnosed with COVID-19 pneumonia in December 2021.  At baseline, she was using a walker and wheelchair PTA for COVID.  Since then she had remained bedbound at home and developed sacral decubitus ulcers. Patient admitted later January for sepsis secondary to infected sacral decubitus ulcer and underwent surgical debridement on 1/27.  Pt then ultimately was discharged to [facility] 2/14, and returns to ED 3/21 from SNF due to family report of fever, malodorous discharge from the wound, AMS, and neglect from facility on basis of family's feeling that the facility is neglecting her.  Pt admitted with Sepsis due to infected sacral decubitus ulcer with chronic osteomyelitis  Clinical Impression  Pt admitted with above diagnosis.  Pt currently with functional limitations due to the deficits listed below (see PT Problem List). Pt will benefit from skilled PT to increase their independence and safety with mobility to allow discharge to the venue listed below.  Pt admitted from SNF and per chart review, requires hoyer lift to w/c and max assist for ADLs.  Pt presents with left UE hemiparesis and left LE hemiplegia.  Pt agreeable to assessing strength/ROM in extremities however declined attempting mobilizing today.       Follow Up Recommendations SNF    Equipment Recommendations  None recommended by PT    Recommendations for Other Services       Precautions / Restrictions        Mobility  Bed Mobility Overal bed mobility: Needs Assistance             General bed mobility comments: appears total assist however pt not willing to attempt; pt unable to reach across midline with left arm due to weakness and unable to lift shoulder with right arm reaching across midline to assist  with rolling    Transfers                 General transfer comment: hoyer lift to w/c per chart review  Ambulation/Gait             General Gait Details: nonambulatory  Stairs            Wheelchair Mobility    Modified Rankin (Stroke Patients Only)       Balance                                             Pertinent Vitals/Pain Pain Assessment: Faces Faces Pain Scale: Hurts even more Pain Location: tender to touch of legs with hand placement for assist Pain Descriptors / Indicators: Grimacing Pain Intervention(s): Repositioned;Monitored during session    Home Living Family/patient expects to be discharged to:: Skilled nursing facility                      Prior Function Level of Independence: Needs assistance   Gait / Transfers Assistance Needed: pt poor historian; reports mostly bedbound but also states she uses w/c and RW; per chart: hoyer lift to w/c, max assist for ADLs           Hand Dominance   Dominant Hand: Right    Extremity/Trunk Assessment   Upper Extremity Assessment Upper Extremity Assessment: LUE deficits/detail  LUE Deficits / Details: 2+/5 grossly at best observed, pt with light grip; pt unable to move full AROM against gravity; also hx of left hemiplegia    Lower Extremity Assessment Lower Extremity Assessment: LLE deficits/detail;RLE deficits/detail RLE Deficits / Details: able to perform R ankle AROM however limited, pt requiring AAROM LLE Deficits / Details: hx of left hemiplegia, no active movement observed, pt reports pain with PROM       Communication   Communication: No difficulties  Cognition Arousal/Alertness: Awake/alert Behavior During Therapy: Flat affect Overall Cognitive Status: No family/caregiver present to determine baseline cognitive functioning                                 General Comments: appears to be poor historian      General Comments       Exercises     Assessment/Plan    PT Assessment Patient needs continued PT services  PT Problem List Decreased strength;Decreased mobility;Decreased activity tolerance;Decreased skin integrity;Decreased knowledge of use of DME;Decreased balance;Decreased range of motion       PT Treatment Interventions Therapeutic exercise;Balance training;Neuromuscular re-education;Patient/family education;Therapeutic activities;Functional mobility training;Wheelchair mobility training    PT Goals (Current goals can be found in the Care Plan section)  Acute Rehab PT Goals Patient Stated Goal: pt agreeable to rehab PT Goal Formulation: With patient Time For Goal Achievement: 08/28/20    Frequency Min 1X/week   Barriers to discharge        Co-evaluation               AM-PAC PT "6 Clicks" Mobility  Outcome Measure Help needed turning from your back to your side while in a flat bed without using bedrails?: Total Help needed moving from lying on your back to sitting on the side of a flat bed without using bedrails?: Total Help needed moving to and from a bed to a chair (including a wheelchair)?: Total Help needed standing up from a chair using your arms (e.g., wheelchair or bedside chair)?: Total Help needed to walk in hospital room?: Total Help needed climbing 3-5 steps with a railing? : Total 6 Click Score: 6    End of Session Equipment Utilized During Treatment: Gait belt Activity Tolerance: Patient tolerated treatment well Patient left: in bed;with call bell/phone within reach;with bed alarm set   PT Visit Diagnosis: Muscle weakness (generalized) (M62.81)    Time: 4496-7591 PT Time Calculation (min) (ACUTE ONLY): 11 min   Charges:   PT Evaluation $PT Eval Low Complexity: 1 Low         Kati PT, DPT Acute Rehabilitation Services Pager: [phone] Office: [phone]  York Ram E 08/14/2020, 1:06 PM

## 2020-08-14 NOTE — Progress Notes (Signed)
Pharmacy Antibiotic Note  Tina Griffith is a 60 y.o. female admitted on 08/11/2020 with sepsis of unknown source. In 05/2020 UCx grew E. Coli (amp, amp/sulb, cipro and bactrim resistant) and MRSA PCR was positive. Pt now presents febrile, tachycardic and tachypneic with a malodorous sacral wound. Admission labs significant for WBC 20.0 and Scr 0.52 (BL ~0.5-0.6). Pharmacy has been consulted for vancomycin and cefepime dosing.  Plan:  Continue vancomycin 1250 mg IV q24h (Scr rounded to 0.8, Vd 0.5, AUC 512, Goal 400-550)  Continue cefepime 2g IV q8h  Flagyl per MD  Monitor renal function, cultures, clinical progression and ability to de-escalate therapy.    Temp (24hrs), Avg:98.4 F (36.9 C), Min:97.7 F (36.5 C), Max:99 F (37.2 C)  Recent Labs  Lab 08/11/20 1233 08/11/20 1314 08/12/20 0344 08/13/20 0050 08/14/20 0102  WBC 20.0*  --  15.9* 19.2* 19.4*  CREATININE 0.52  --  0.44 0.63 0.40*  LATICACIDVEN  --  1.5  --   --   --     Estimated Creatinine Clearance: 80.9 mL/min (A) (by C-G formula based on SCr of 0.4 mg/dL (L)).    Allergies  Allergen Reactions  . No Known Allergies     Antimicrobials this admission: Vancomycin 3/21 >>  Cefepime 3/21 >>  Flagyl 3/21 >>  Dose adjustments this admission: N/A  Microbiology results: - CT a/p shows likely chronic osteo at inferior coccyx; no abscess or acute osteo - ESR, CRP elevated - LA WNL, no PCT 3/21 Bcx: ngtd 3/21 Ucx: no result 3/22 UCx: NGF  Thank you for allowing pharmacy to be a part of this patient's care.   Royetta Asal, PharmD, BCPS 08/14/2020 2:08 PM

## 2020-08-14 NOTE — Progress Notes (Signed)
Pt brought down to endo for procedure and after talking with physician, pt decided she did not want the procedure done. MD to speak w/ pt's family. Pt returned back to room.

## 2020-08-15 ENCOUNTER — Inpatient Hospital Stay (HOSPITAL_COMMUNITY): Payer: Medicare Other

## 2020-08-15 DIAGNOSIS — R509 Fever, unspecified: Secondary | ICD-10-CM | POA: Diagnosis not present

## 2020-08-15 DIAGNOSIS — M4628 Osteomyelitis of vertebra, sacral and sacrococcygeal region: Secondary | ICD-10-CM | POA: Diagnosis not present

## 2020-08-15 DIAGNOSIS — K5900 Constipation, unspecified: Secondary | ICD-10-CM | POA: Diagnosis not present

## 2020-08-15 DIAGNOSIS — A419 Sepsis, unspecified organism: Secondary | ICD-10-CM | POA: Diagnosis not present

## 2020-08-15 DIAGNOSIS — K5909 Other constipation: Secondary | ICD-10-CM | POA: Diagnosis not present

## 2020-08-15 LAB — CBC WITH DIFFERENTIAL/PLATELET
Abs Immature Granulocytes: 0.37 10*3/uL — ABNORMAL HIGH (ref 0.00–0.07)
Basophils Absolute: 0.1 10*3/uL (ref 0.0–0.1)
Basophils Relative: 0 %
Eosinophils Absolute: 0 10*3/uL (ref 0.0–0.5)
Eosinophils Relative: 0 %
HCT: 25.5 % — ABNORMAL LOW (ref 36.0–46.0)
Hemoglobin: 8.1 g/dL — ABNORMAL LOW (ref 12.0–15.0)
Immature Granulocytes: 2 %
Lymphocytes Relative: 8 %
Lymphs Abs: 1.7 10*3/uL (ref 0.7–4.0)
MCH: 24.8 pg — ABNORMAL LOW (ref 26.0–34.0)
MCHC: 31.8 g/dL (ref 30.0–36.0)
MCV: 78 fL — ABNORMAL LOW (ref 80.0–100.0)
Monocytes Absolute: 1.3 10*3/uL — ABNORMAL HIGH (ref 0.1–1.0)
Monocytes Relative: 6 %
Neutro Abs: 17.3 10*3/uL — ABNORMAL HIGH (ref 1.7–7.7)
Neutrophils Relative %: 84 %
Platelets: 723 10*3/uL — ABNORMAL HIGH (ref 150–400)
RBC: 3.27 MIL/uL — ABNORMAL LOW (ref 3.87–5.11)
RDW: 19.8 % — ABNORMAL HIGH (ref 11.5–15.5)
WBC: 20.8 10*3/uL — ABNORMAL HIGH (ref 4.0–10.5)
nRBC: 0.1 % (ref 0.0–0.2)

## 2020-08-15 LAB — COMPREHENSIVE METABOLIC PANEL
ALT: 19 U/L (ref 0–44)
AST: 18 U/L (ref 15–41)
Albumin: 1.9 g/dL — ABNORMAL LOW (ref 3.5–5.0)
Alkaline Phosphatase: 92 U/L (ref 38–126)
Anion gap: 9 (ref 5–15)
BUN: 7 mg/dL (ref 6–20)
CO2: 18 mmol/L — ABNORMAL LOW (ref 22–32)
Calcium: 7.7 mg/dL — ABNORMAL LOW (ref 8.9–10.3)
Chloride: 111 mmol/L (ref 98–111)
Creatinine, Ser: 0.41 mg/dL — ABNORMAL LOW (ref 0.44–1.00)
GFR, Estimated: >60 mL/min (ref >60)
Glucose, Bld: 114 mg/dL — ABNORMAL HIGH (ref 70–99)
Potassium: 2.6 mmol/L — CL (ref 3.5–5.1)
Sodium: 138 mmol/L (ref 135–145)
Total Bilirubin: 0.6 mg/dL (ref 0.3–1.2)
Total Protein: 4.8 g/dL — ABNORMAL LOW (ref 6.5–8.1)

## 2020-08-15 LAB — HEMOGLOBIN AND HEMATOCRIT, BLOOD
HCT: 25.9 % — ABNORMAL LOW (ref 36.0–46.0)
HCT: 28 % — ABNORMAL LOW (ref 36.0–46.0)
HCT: 28.6 % — ABNORMAL LOW (ref 36.0–46.0)
Hemoglobin: 8.3 g/dL — ABNORMAL LOW (ref 12.0–15.0)
Hemoglobin: 8.7 g/dL — ABNORMAL LOW (ref 12.0–15.0)
Hemoglobin: 9.1 g/dL — ABNORMAL LOW (ref 12.0–15.0)

## 2020-08-15 LAB — GLUCOSE, CAPILLARY
Glucose-Capillary: 103 mg/dL — ABNORMAL HIGH (ref 70–99)
Glucose-Capillary: 103 mg/dL — ABNORMAL HIGH (ref 70–99)
Glucose-Capillary: 112 mg/dL — ABNORMAL HIGH (ref 70–99)
Glucose-Capillary: 152 mg/dL — ABNORMAL HIGH (ref 70–99)

## 2020-08-15 LAB — MAGNESIUM: Magnesium: 1.6 mg/dL — ABNORMAL LOW (ref 1.7–2.4)

## 2020-08-15 MED ORDER — MAGNESIUM SULFATE 2 GM/50ML IV SOLN
2.0000 g | Freq: Once | INTRAVENOUS | Status: AC
  Administered 2020-08-15: 2 g via INTRAVENOUS
  Filled 2020-08-15: qty 50

## 2020-08-15 MED ORDER — SODIUM CHLORIDE 0.9 % IV BOLUS
1000.0000 mL | Freq: Once | INTRAVENOUS | Status: AC
  Administered 2020-08-15: 1000 mL via INTRAVENOUS

## 2020-08-15 MED ORDER — SODIUM CHLORIDE 0.9 % IV BOLUS: 500.0000 mL | Freq: Once | INTRAVENOUS | Status: DC

## 2020-08-15 MED ORDER — SODIUM CHLORIDE 0.9 % IV BOLUS
500.0000 mL | Freq: Once | INTRAVENOUS | Status: AC
  Administered 2020-08-15: 500 mL via INTRAVENOUS

## 2020-08-15 MED ORDER — LACTATED RINGERS IV SOLN: INTRAVENOUS | Status: AC

## 2020-08-15 MED ORDER — POTASSIUM CHLORIDE 10 MEQ/100ML IV SOLN
10.0000 meq | INTRAVENOUS | Status: AC
  Administered 2020-08-15 (×4): 10 meq via INTRAVENOUS
  Filled 2020-08-15 (×4): qty 100

## 2020-08-15 MED ORDER — LACTATED RINGERS IV BOLUS
500.0000 mL | Freq: Once | INTRAVENOUS | Status: AC
  Administered 2020-08-15: 500 mL via INTRAVENOUS

## 2020-08-15 MED ORDER — POTASSIUM CHLORIDE CRYS ER 20 MEQ PO TBCR
40.0000 meq | EXTENDED_RELEASE_TABLET | ORAL | Status: AC
  Administered 2020-08-15 (×2): 40 meq via ORAL
  Filled 2020-08-15 (×2): qty 2

## 2020-08-15 NOTE — Progress Notes (Addendum)
PROGRESS NOTE    Tina Griffith  QMG:[id number] DOB: 06/01/1960 DOA: 08/11/2020 PCP: [facility]   Chief Complaint  Patient presents with  . sacral wound    Brief Narrative:  Tina Griffith is a 60 y.o. female with a history of MS, NIDT2DM, HTN, IBS-C who suffered covid-19 pneumonia in December 2021 with resultant bedbound status and developed sacral decubitus ulcer which became infected necessitating admission 06/16/2020 and debridement 1/27. Ultimately was discharged to [facility] 2/14, and returns to [facility] 3/[facility] from SNF due to family report of fever, malodorous discharge from the wound, AMS, and neglect from facility. on basis of family's feeling that the facility is neglecting her. In the ED she was febrile, tachycardic, tachypneic with leukocytosis, thrombocytosis. She had a large sacral decubitus ulcer (pictured in H&P) seen on exam with CT abd/pelvis revealing changes consistent with chronic osteomyelitis  without abscess or acute osteomyelitis. UA not consistent with infection and CXR without lobar infiltrate. Vancomycin, cefepime, and flagyl were given after blood cultures were drawn and she was admitted to [facility] based on bed availability with ID consulted.    Assessment & Plan:   Principal Problem:   Sepsis ([place]) Active Problems:   Constipation   Tobacco abuse   Anemia   AMS (altered mental status)   Diabetes ([place])   Fecal impaction ([facility])   Dark stools  Sepsis due to infected sacral decubitus ulcer with chronic osteomyelitis:  - Continue broad antimicrobial therapy pending ID consult and culture results - appreciate ID recommendations - recommending doxycycline BID and amp/sub x4 weeks then converting to oral regimen - planning to follow telephone visit at SNF at discharge - wound c/s - surgery planning to follow - not planning for diversion at this point  - Pain control as ordered - blood cx 3/21 NGTD - urine cx 3/22 with no growth  - Sed rate 65 from 92 on 06/21/20 -  CRP 15.4 from 13.9 on 06/21/20 - fever 3/24 PM follow pending cultures, repeat CXR.  Will discuss with ID.  Tachycardia EKG appears similar to priors, sinus tach, RBBB, LAFB Follow with IVF Likely related to fever, but ctm, w/u additionally prn  Melena  Concern for Acute Blood Loss Anemia  Iron Def Anemia Low normal B12, follow MMA Serial Hb - relatively stable Suggestive of iron def with aocd IV PPI BID Plan for endoscopy 3/24 AM with GI - she declined this - I think she's agreeable now, will discuss again with GI regarding timing of this -> though need to address her tachycardia first.  Constipation with fecal ball on CT:  - appreciate GI assistance - bowel regimen - enemas, disimpaction  Multiple sclerosis:  Resume dalfampridine  RLE edema:  -negative [place] for DVT  HTN: Has been on metoprolol confirmed by last discharge summary. This will be reordered with tachycardia and hypertension  NIDT2DM: HbA1c 6.5% indicating good chronic control.  - SSI  Tobacco use: 1ppd.  - Cessation counseling, nicotine patch.  Concern for neglect at [facility].  - CSW consulted.   Acute Metabolic Encephalopathy T26, folate wnl Follow TSH Likely 2/2 acute illness and MS Delirium precautions  IBS, GERD:  - Was on PPI previously.   RN Pressure Injury Documentation: Pressure Injury 06/17/20 Buttocks Right;Anterior Stage 4 - Full thickness tissue loss with exposed bone, tendon or muscle. Deep Oval shaped pressure wound (6cmx4cmx3cm) muscle and bone exposed, areas of necrotic tissue at the edges, red & yellow inside w (Active)  06/17/20 2200  Location: Buttocks  Location Orientation: Right;Anterior (extending towards the sacrum)  Staging: Stage 4 - Full thickness tissue loss with exposed bone, tendon or muscle.  Wound Description (Comments): Deep Oval shaped pressure wound (6cmx4cmx3cm) muscle and bone exposed, areas of necrotic tissue at the edges, red & yellow inside w/active bleeding &  tunneling, foul smelling  Present on Admission: Yes   DVT prophylaxis: SCD Code Status:full  Family Communication: none at bedside - sister/daughter over phone 3/24 Disposition:   Status is: Inpatient  Remains inpatient appropriate because:Inpatient level of care appropriate due to severity of illness   Dispo: The patient is from: Home              Anticipated d/c is to: pending              Patient currently is not medically stable to d/c.   Difficult to place patient No   Consultants:   Surgery GI  ID   Procedures:   LE [place] Summary:  RIGHT:  - There is no evidence of deep vein thrombosis in the lower extremity.    - No cystic structure found in the popliteal fossa.    LEFT:  - No evidence of common femoral vein obstruction.      Antimicrobials:  Anti-infectives (From admission, onward)   Start     Dose/Rate Route Frequency Ordered Stop   08/14/20 2200  Ampicillin-Sulbactam (UNASYN) 3 g in sodium chloride 0.9 % 100 mL IVPB        3 g 200 mL/hr over 30 Minutes Intravenous Every 6 hours 08/14/20 1603     08/14/20 2200  doxycycline (VIBRA-TABS) tablet 100 mg        100 mg Oral Every 12 hours 08/14/20 1603     08/12/20 1400  vancomycin (VANCOREADY) IVPB 1250 mg/250 mL  Status:  Discontinued        1,250 mg 166.7 mL/hr over 90 Minutes Intravenous Every 24 hours 08/11/20 1429 08/14/20 1603   08/11/20 2200  ceFEPIme (MAXIPIME) 2 g in sodium chloride 0.9 % 100 mL IVPB  Status:  Discontinued        2 g 200 mL/hr over 30 Minutes Intravenous Every 8 hours 08/11/20 1429 08/14/20 1603   08/11/20 2200  metroNIDAZOLE (FLAGYL) IVPB 500 mg  Status:  Discontinued        500 mg 100 mL/hr over 60 Minutes Intravenous Every 8 hours 08/11/20 2128 08/14/20 1603   08/11/20 1245  ceFEPIme (MAXIPIME) 2 g in sodium chloride 0.9 % 100 mL IVPB        2 g 200 mL/hr over 30 Minutes Intravenous  Once 08/11/20 1236 08/11/20 1426   08/11/20 1245  metroNIDAZOLE (FLAGYL) IVPB 500 mg         500 mg 100 mL/hr over 60 Minutes Intravenous  Once 08/11/20 1236 08/11/20 1517   08/11/20 1245  vancomycin (VANCOREADY) IVPB 1500 mg/300 mL        1,500 mg 150 mL/hr over 120 Minutes Intravenous  Once 08/11/20 1236 08/11/20 1634      Subjective: Feels crappy, doesn't elaborate much more A&OX1-2 (knew [place], didn't know [facility] - knew month)  Objective: Vitals:   08/15/20 0743 08/15/20 0828 08/15/20 0928 08/15/20 1021  BP: (!) 145/59  139/71 135/72  Pulse: (!) 121  (!) 123 (!) 120  Resp:   16   Temp:   99.3 F (37.4 C)   TempSrc:   Oral   SpO2: 99% 95% 97%   Weight:  Height:        Intake/Output Summary (Last 24 hours) at 08/15/2020 1137 Last data filed at 08/15/2020 1000 Gross per 24 hour  Intake 943.83 ml  Output 700 ml  Net 243.83 ml   Filed Weights   08/13/20 0900  Weight: 76.8 kg    Examination:  General: No acute distress. Cardiovascular: tachycardic, regular rhythm Lungs: Clear to auscultation bilaterally  Abdomen: Soft, nontender, nondistended  Neurological: Alert and oriented 1-2. Moves all extremities except LLE.  Skin: stage 4 decub seen, no obvious signs of worsening infection Extremities: No clubbing or cyanosis. No edema.  Data Reviewed: I have personally reviewed following labs and imaging studies  CBC: Recent Labs  Lab 08/11/20 1233 08/12/20 0344 08/12/20 1743 08/13/20 0050 08/13/20 1058 08/14/20 0102 08/14/20 0933 08/14/20 1829 08/15/20 0028 08/15/20 0746 08/15/20 0755  WBC 20.0* 15.9*  --  19.2*  --  19.4*  --   --   --   --  20.8*  NEUTROABS 16.2*  --   --   --   --  15.7*  --   --   --   --  17.3*  HGB 9.0* 9.4*   < > 8.7*   < > 8.5* 8.9* 8.7* 9.1* 8.3* 8.1*  HCT 28.2* 30.0*   < > 28.5*   < > 26.9* 28.0* 27.4* 28.6* 25.9* 25.5*  MCV 79.0* 81.3  --  81.0  --  79.8*  --   --   --   --  78.0*  PLT 773* 717*  --  792*  --  751*  --   --   --   --  723*   < > = values in this interval not displayed.    Basic Metabolic  Panel: Recent Labs  Lab 08/11/20 1233 08/12/20 0344 08/13/20 0050 08/13/20 1058 08/14/20 0102 08/15/20 0755 08/15/20 0946  NA 133* 136 137  --  134* 138  --   K 4.2 3.4* 3.2*  --  3.7 2.6*  --   CL 99 106 109  --  108 111  --   CO2 24 19* 17*  --  16* 18*  --   GLUCOSE 119* 96 94  --  96 114*  --   BUN 6 7 7   --  8 7  --   CREATININE 0.52 0.44 0.63  --  0.40* 0.41*  --   CALCIUM 9.3 9.0 8.9  --  8.6* 7.7*  --   MG  --   --   --  1.7 1.6*  --  1.6*  PHOS  --   --   --   --  1.9*  --   --     GFR: Estimated Creatinine Clearance: 80.9 mL/min (A) (by C-G formula based on SCr of 0.41 mg/dL (L)).  Liver Function Tests: Recent Labs  Lab 08/11/20 1233 08/14/20 0102 08/15/20 0755  AST 17 10* 18  ALT 29 18 19   ALKPHOS 119 93 92  BILITOT 0.5 0.7 0.6  PROT 5.5* 5.2* 4.8*  ALBUMIN 2.0* 2.2* 1.9*    CBG: Recent Labs  Lab 08/14/20 1053 08/14/20 1619 08/14/20 2200 08/15/20 0716 08/15/20 1123  GLUCAP 89 138* 171* 103* 103*     Recent Results (from the past 240 hour(s))  Urine Culture     Status: None   Collection Time: 08/11/20 12:33 PM   Specimen: In/Out Cath Urine  Result Value Ref Range Status   Specimen Description IN/OUT CATH URINE  Final  Special Requests NONE  Final   Culture   Final    NO GROWTH Performed at [facility], [place] [address] [place], [place] [address]    Report Status 08/14/2020 FINAL  Final  Blood Culture (routine x 2)     Status: None (Preliminary result)   Collection Time: 08/11/20 12:40 PM   Specimen: BLOOD LEFT HAND  Result Value Ref Range Status   Specimen Description BLOOD LEFT HAND  Final   Special Requests   Final    BOTTLES DRAWN AEROBIC ONLY Blood Culture results may not be optimal due to an inadequate volume of blood received in culture bottles   Culture   Final    NO GROWTH 4 DAYS Performed at [facility], [place] [address] [place], [place] [address]    Report Status PENDING  Incomplete  Blood Culture (routine x  2)     Status: None (Preliminary result)   Collection Time: 08/11/20 12:41 PM   Specimen: BLOOD RIGHT ARM  Result Value Ref Range Status   Specimen Description BLOOD RIGHT ARM  Final   Special Requests   Final    BOTTLES DRAWN AEROBIC AND ANAEROBIC Blood Culture adequate volume   Culture   Final    NO GROWTH 4 DAYS Performed at [facility], [place] [address] [place], [place] [address]    Report Status PENDING  Incomplete  Resp Panel by RT-PCR (Flu A&B, Covid) Nasopharyngeal Swab     Status: None   Collection Time: 08/11/20  1:10 PM   Specimen: Nasopharyngeal Swab; Nasopharyngeal(NP) swabs in vial transport medium  Result Value Ref Range Status   SARS Coronavirus 2 by RT PCR NEGATIVE NEGATIVE Final    Comment: (NOTE) SARS-CoV-2 target nucleic acids are NOT DETECTED.  The SARS-CoV-2 RNA is generally detectable in upper respiratory specimens during the acute phase of infection. The lowest concentration of SARS-CoV-2 viral copies this assay can detect is 138 copies/mL. A negative result does not preclude SARS-Cov-2 infection and should not be used as the sole basis for treatment or other patient management decisions. A negative result may occur with  improper specimen collection/handling, submission of specimen other than nasopharyngeal swab, presence of viral mutation(s) within the areas targeted by this assay, and inadequate number of viral copies(<138 copies/mL). A negative result must be combined with clinical observations, patient history, and epidemiological information. The expected result is Negative.  Fact Sheet for Patients:  EntrepreneurPulse.com.au  Fact Sheet for Healthcare Providers:  IncredibleEmployment.be  This test is no t yet approved or cleared by the [place] FDA and  has been authorized for detection and/or diagnosis of SARS-CoV-2 by FDA under an Emergency Use Authorization (EUA). This EUA will remain  in effect  (meaning this test can be used) for the duration of the COVID-19 declaration under Section 564(b)(1) of the Act, 21 U.S.C.section 360bbb-3(b)(1), unless the authorization is terminated  or revoked sooner.       Influenza A by PCR NEGATIVE NEGATIVE Final   Influenza B by PCR NEGATIVE NEGATIVE Final    Comment: (NOTE) The Xpert Xpress SARS-CoV-2/FLU/RSV plus assay is intended as an aid in the diagnosis of influenza from Nasopharyngeal swab specimens and should not be used as a sole basis for treatment. Nasal washings and aspirates are unacceptable for Xpert Xpress SARS-CoV-2/FLU/RSV testing.  Fact Sheet for Patients: EntrepreneurPulse.com.au  Fact Sheet for Healthcare Providers: IncredibleEmployment.be  This test is not yet approved or cleared by the [place] and  has been authorized for detection and/or diagnosis of SARS-CoV-2 by FDA under an Emergency Use Authorization (EUA). This EUA will remain in effect (meaning this test can be used) for the duration of the COVID-19 declaration under Section 564(b)(1) of the Act, 21 U.S.C. section 360bbb-3(b)(1), unless the authorization is terminated or revoked.  Performed at [facility], [place] [address], [place], [place] [address]   MRSA PCR Screening     Status: None   Collection Time: 08/12/20 12:29 PM   Specimen: Nasal Mucosa; Nasopharyngeal  Result Value Ref Range Status   MRSA by PCR NEGATIVE NEGATIVE Final    Comment:        The GeneXpert MRSA Assay (FDA approved for NASAL specimens only), is one component of a comprehensive MRSA colonization surveillance program. It is not intended to diagnose MRSA infection nor to guide or monitor treatment for MRSA infections. Performed at [facility], [place] [address] [place], [place] [address]   Urine culture     Status: None   Collection Time: 08/12/20  1:34 PM   Specimen: Urine, Catheterized  Result Value Ref  Range Status   Specimen Description   Final    URINE, CATHETERIZED Performed at [place] [address], [place], [place] [address]    Special Requests   Final    NONE Performed at [facility], [place] [address], [place], [place] [address]    Culture   Final    NO GROWTH Performed at [facility], [place] [address] [place],  [address]    Report Status 08/13/2020 FINAL  Final         Radiology Studies: No results found.      Scheduled Meds: . vitamin C  500 mg Oral Daily  . dalfampridine  10 mg Oral BID  . doxycycline  100 mg Oral Q12H  . feeding supplement  237 mL Oral BID BM  . insulin aspart  0-5 Units Subcutaneous QHS  . insulin aspart  0-9 Units Subcutaneous TID WC  . metoprolol succinate  100 mg Oral Daily  . mometasone-formoterol  2 puff Inhalation BID  . nicotine  21 mg Transdermal Daily  . nutrition supplement (JUVEN)  1 packet Oral BID BM  . nystatin  5 mL Oral QID  . oxybutynin  10 mg Oral Daily  . pantoprazole (PROTONIX) IV  40 mg Intravenous Q12H  . polyethylene glycol  17 g Oral BID  . potassium chloride  40 mEq Oral Q4H  . senna-docusate  1 tablet Oral QHS  . sorbitol, milk of mag, mineral oil, glycerin (SMOG) enema  960 mL Rectal BID  . zinc sulfate  220 mg Oral Daily   Continuous Infusions: . ampicillin-sulbactam (UNASYN) IV 3 g (08/15/20 0532)  . lactated ringers    . lactated ringers    . magnesium sulfate bolus IVPB    . potassium chloride 10 mEq (08/15/20 1125)     LOS: 4 days    Time spent: over 30 min    Fayrene Helper, MD [facility]   To contact the attending provider between 7A-7P or the covering provider during after hours 7P-7A, please log into the web site [email or web address] and access using universal [place] password for that web site. If you do not have the password, please call the hospital operator.  08/15/2020, 11:37 AM

## 2020-08-15 NOTE — Progress Notes (Signed)
ID PROGRESS NOTE   Patient had isolated fever last night, she reports it has been happening.  ROS: no diarrhea/n/v/rash  O:BP 140/60 (BP Location: Left Arm)   Pulse (!) 108   Temp 98.7 F (37.1 C)   Resp 16   Ht 5\' 7"  (1.702 m)   Wt 76.8 kg   SpO2 100%   BMI 26.52 kg/m  Physical Exam  Constitutional:  oriented to person, place, and time. appears well-developed and well-nourished. No distress.  HENT: [place]/AT, PERRLA, no scleral icterus Mouth/Throat: Oropharynx is clear and moist. No oropharyngeal exudate.  Cardiovascular: Normal rate, regular rhythm and normal heart sounds. Exam reveals no gallop and no friction rub.  No murmur heard.  Pulmonary/Chest: Effort normal and breath sounds normal. No respiratory distress.  has no wheezes.  Neck = supple, no nuchal rigidity Abdominal: Soft. Bowel sounds are normal.  exhibits no distension. There is no tenderness.  Lymphadenopathy: no cervical adenopathy. No axillary adenopathy Neurological: alert and oriented to person, place, and time. Weakness to upper/lower extremities-stable Skin: Skin is warm and dry. No rash noted. No erythema.  Psychiatric: a normal mood and affect.  behavior is normal.   Imaging = cxr no infiltrates per my read Micro = 3/25 blood cx pending, 3/22 urine cx NGTD, 3/21 blood cx NGTD  A/P: 60yo F with advanced MS, bedbound, hx of covid pneumonia, admitted for worsening sacral decub ulcer in setting of fever, and leukocytosis  - continue on doxycycline plus amp/sub. If she has recurrent fever or persistent fever, recommend to change to meropenem - for now can continue to watch if recurrence in fever - no new source of infection - sacral decub= fairly clean, continue with wound care recs

## 2020-08-15 NOTE — Progress Notes (Signed)
   08/15/20 0928  Assess: MEWS Score  Temp 99.3 F (37.4 C)  BP 139/71  Pulse Rate (!) 123  Resp 16  SpO2 97 %  Patient Activity (if Appropriate) In bed  Assess: MEWS Score  MEWS Temp 0  MEWS Systolic 0  MEWS Pulse 2  MEWS RR 0  MEWS LOC 0  MEWS Score 2  MEWS Score Color Yellow  Assess: if the MEWS score is Yellow or Red  Were vital signs taken at a resting state? Yes  Focused Assessment No change from prior assessment  Early Detection of Sepsis Score *See Row Information* High  MEWS guidelines implemented *See Row Information* No, previously yellow, continue vital signs every 4 hours  Treat  Pain Scale 0-10  Pain Score 0

## 2020-08-15 NOTE — NC FL2 (Signed)
[place] LEVEL OF CARE SCREENING TOOL     IDENTIFICATION  Patient Name: Tina Griffith Birthdate: 25-Jun-1960 Sex: female Admission Date (Current Location): 08/11/2020  [facility] and [place] Number:  Herbalist and Address:  [facility],  [place], [place]      Provider Number: [id number]  Attending Physician Name and Address:  Elodia Florence., *  Relative Name and Phone Number:  [name] tr [phone]    Current Level of Care: Hospital Recommended Level of Care: [place] Prior Approval Number:    Date Approved/Denied:   PASRR Number: [id number]  Discharge Plan: SNF    Current Diagnoses: Patient Active Problem List   Diagnosis Date Noted  . Fecal impaction ([place])   . Dark stools   . Sepsis ([place]) 08/11/2020  . AMS (altered mental status) 08/11/2020  . Diabetes ([place]) 08/11/2020  . Fever 06/26/2020  . Hypoxia   . Impaction of colon ([place])   . Anemia   . Malnutrition of moderate degree 06/19/2020  . Wound infection 06/17/2020  . Leucocytosis 06/16/2020  . Sepsis with acute organ dysfunction without septic shock ([place]) 06/16/2020  . UTI (urinary tract infection) 06/16/2020  . Depression with anxiety 04/20/2016  . Insomnia 04/20/2016  . Chronic prescription opiate use 01/16/2016  . Hypertriglyceridemia 06/20/2015  . Abnormal urine odor 10/09/2014  . Chronic kidney disease 10/09/2014  . Elevated WBC count 10/09/2014  . Multiple sclerosis ([place]) 07/25/2014  . Spastic gait 07/25/2014  . Leg pain, left 07/25/2014  . Urinary frequency 07/25/2014  . Constipation 07/25/2014  . Decreased potassium in the blood 04/22/2014  . Hypomagnesemia 04/22/2014  . Ataxic gait 02/28/2014  . Callosity 02/28/2014  . 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 Contracture of ankle and foot joint 02/28/2014  . Decubital ulcer 02/28/2014  . Fibroid 02/28/2014  . Dysfunctional or functional uterine  hemorrhage 02/28/2014  . Incisional hernia with obstruction but no gangrene 02/28/2014  . Gonalgia 02/28/2014  . Extremity pain 02/28/2014  . Decreased motor strength 02/28/2014  . Non-pressure ulcer of lower extremity ([place]) 02/28/2014  . Loss of feeling or sensation 02/28/2014  . Anterior optic neuritis 02/28/2014  . Hemorrhage, postmenopausal 02/28/2014  . Arthritis of knee, degenerative 02/28/2014  . AS (sickle cell trait) ([place]) 02/28/2014  . Hemiplegia, spastic ([place]) 02/28/2014  . Sickle cell trait ([place]) 02/28/2014  . Spastic hemiplegia ([place]) 02/28/2014  . Abnormal results of thyroid function studies 12/28/2013  . Allergic rhinitis 08/22/2013  . Disorder of magnesium metabolism 08/22/2013  . Essential (primary) hypertension 08/22/2013  . Hypercholesteremia 08/22/2013  . Adult hypothyroidism 08/22/2013  . Anemia, iron deficiency 08/22/2013  . Compulsive tobacco user syndrome 08/22/2013  . Absence of bladder continence 08/22/2013  . Hypercholesterolemia 08/22/2013  . Tobacco abuse 08/22/2013  . Accumulation of fluid in tissues 11/29/2012  . Dermatophytic onychia 10/28/2012  . Colon, diverticulosis 10/28/2012  . Polypharmacy 10/28/2012  . DS (disseminated sclerosis) ([place]) 10/28/2012  . Arthralgia of lower leg 10/28/2012  . Menopausal symptom 10/28/2012  . Current tear of lateral cartilage or meniscus of knee 10/28/2012  . Diverticular disease of large intestine 10/28/2012  . Other long term (current) drug therapy 10/28/2012  . Abnormal uterine bleeding 06/26/2012  . Baseball finger 08/03/2010    Orientation RESPIRATION BLADDER Height & Weight     Self,Place  Normal Incontinent Weight: 76.8 kg Height:  5\' 7"  (170.2 cm)  BEHAVIORAL SYMPTOMS/MOOD NEUROLOGICAL BOWEL NUTRITION STATUS  Incontinent Diet (Regular)  AMBULATORY STATUS COMMUNICATION OF NEEDS Skin   Extensive Assist Verbally PU Stage and Appropriate Care (cleanse w/NS,dry,abd BID dsg changes & PRN)        PU Stage 4 Dressing: BID               Personal Care Assistance Level of Assistance  Bathing,Feeding,Dressing Bathing Assistance: Limited assistance Feeding assistance: Limited assistance Dressing Assistance: Limited assistance     Functional Limitations Info  Sight,Hearing,Speech Sight Info: Adequate Hearing Info: Adequate Speech Info: Adequate    SPECIAL CARE FACTORS FREQUENCY  PT (By licensed PT),OT (By licensed OT)     PT Frequency: 5x week OT Frequency: 5x week            Contractures Contractures Info: Not present    Additional Factors Info  Insulin Sliding Scale       Insulin Sliding Scale Info: SSI see MAR       Current Medications (08/15/2020):  This is the current hospital active medication list Current Facility-Administered Medications  Medication Dose Route Frequency Provider Last Rate Last Admin  . acetaminophen (TYLENOL) tablet 650 mg  650 mg Oral Q6H PRN Shela Leff, MD   650 mg at 08/13/20 0533   Or  . acetaminophen (TYLENOL) suppository 650 mg  650 mg Rectal Q6H PRN Shela Leff, MD      . Ampicillin-Sulbactam (UNASYN) 3 g in sodium chloride 0.9 % 100 mL IVPB  3 g Intravenous Q6H Carlyle Basques, MD 200 mL/hr at 08/15/20 1136 3 g at 08/15/20 1136  . ascorbic acid (VITAMIN C) tablet 500 mg  500 mg Oral Daily Elodia Florence., MD   500 mg at 08/15/20 1021  . dalfampridine TB12 10 mg  10 mg Oral BID Elodia Florence., MD      . doxycycline (VIBRA-TABS) tablet 100 mg  100 mg Oral Q12H Carlyle Basques, MD   100 mg at 08/15/20 1021  . feeding supplement (ENSURE ENLIVE / ENSURE PLUS) liquid 237 mL  237 mL Oral BID BM Elodia Florence., MD   237 mL at 08/14/20 2159  . HYDROcodone-acetaminophen (NORCO/VICODIN) 5-325 MG per tablet 1-2 tablet  1-2 tablet Oral Q6H PRN Mariel Aloe, MD   2 tablet at 08/14/20 1539  . insulin aspart (novoLOG) injection 0-5 Units  0-5 Units Subcutaneous QHS Shela Leff, MD      . insulin  aspart (novoLOG) injection 0-9 Units  0-9 Units Subcutaneous TID WC Shela Leff, MD   1 Units at 08/14/20 1753  . lactated ringers infusion   Intravenous Continuous Elodia Florence., MD 100 mL/hr at 08/15/20 1212 New Bag at 08/15/20 1212  . metoprolol succinate (TOPROL-XL) 24 hr tablet 100 mg  100 mg Oral Daily Patrecia Pour, MD   100 mg at 08/15/20 1021  . mometasone-formoterol (DULERA) 100-5 MCG/ACT inhaler 2 puff  2 puff Inhalation BID Elodia Florence., MD   2 puff at 08/15/20 0827  . nicotine (NICODERM CQ - dosed in mg/24 hours) patch 21 mg  21 mg Transdermal Daily Shela Leff, MD   21 mg at 08/15/20 1021  . nutrition supplement (JUVEN) (JUVEN) powder packet 1 packet  1 packet Oral BID BM Elodia Florence., MD      . nystatin (MYCOSTATIN) 100000 UNIT/ML suspension 500,000 Units  5 mL Oral QID Carlyle Basques, MD   500,000 Units at 08/15/20 1020  . oxybutynin (DITROPAN-XL) 24 hr tablet 10 mg  10  mg Oral Daily Elodia Florence., MD   10 mg at 08/15/20 1021  . pantoprazole (PROTONIX) injection 40 mg  40 mg Intravenous Q12H Mariel Aloe, MD   40 mg at 08/15/20 1022  . polyethylene glycol (MIRALAX / GLYCOLAX) packet 17 g  17 g Oral BID Esterwood, Amy S, PA-C   17 g at 08/15/20 1021  . potassium chloride 10 mEq in 100 mL IVPB  10 mEq Intravenous Q1 Hr x 4 Elodia Florence., MD 100 mL/hr at 08/15/20 1251 10 mEq at 08/15/20 1251  . potassium chloride SA (KLOR-CON) CR tablet 40 mEq  40 mEq Oral Q4H Elodia Florence., MD   40 mEq at 08/15/20 1021  . senna-docusate (Senokot-S) tablet 1 tablet  1 tablet Oral QHS Shela Leff, MD   1 tablet at 08/14/20 2200  . sorbitol, milk of mag, mineral oil, glycerin (SMOG) enema  960 mL Rectal BID Esterwood, Amy S, PA-C   960 mL at 08/14/20 1335  . zinc sulfate capsule 220 mg  220 mg Oral Daily Elodia Florence., MD   220 mg at 08/15/20 1021     Discharge Medications: Please see discharge summary for a list  of discharge medications.  Relevant Imaging Results:  Relevant Lab Results:   Additional Information SS# [id number]; pt has had COVID vaccination but not booster  Mahabir, Juliann Pulse, RN

## 2020-08-15 NOTE — TOC Progression Note (Signed)
Transition of Care [facility]) - Progression Note    Patient Details  Name: Tina Griffith MRN: [id number] Date of Birth: 1960-09-22  [facility]) CM/SW Contact  Lason Eveland, Juliann Pulse, RN Phone Number: 08/15/2020, 1:39 PM  Clinical Narrative: PT rec SNF-per dtr [name] will fax out-await bed offers, will need auth, & covid prior d/c.      Expected Discharge Plan: [place] Barriers to Discharge: Continued Medical Work up  Expected Discharge Plan and Services Expected Discharge Plan: [place]   Discharge Planning Services: CM Consult   Living arrangements for the past 2 months: [place]                                       Social Determinants of Health (SDOH) Interventions    Readmission Risk Interventions Readmission Risk Prevention Plan 06/18/2020  Transportation Screening Complete  PCP or Specialist Appt within 5-7 Days Complete  Home Care Screening Complete  Medication Review (RN CM) Complete  Some recent data might be hidden

## 2020-08-15 NOTE — Care Management Important Message (Signed)
Important Message  Patient Details IM Letter given to the Patient. Name: Tina Griffith MRN: [id number] Date of Birth: December 22, 1960   Medicare Important Message Given:  Yes     Kerin Salen 08/15/2020, 11:39 AM

## 2020-08-15 NOTE — Progress Notes (Signed)
PHARMACY CONSULT NOTE FOR:  OUTPATIENT  PARENTERAL ANTIBIOTIC THERAPY (OPAT)  Indication: Sacral decubitus ulcer with chronic osteomyelitis Regimen: Unasyn 3 gm Q 6 hours End date: 09/11/2020  IV antibiotic discharge orders are pended. To discharging provider:  please sign these orders via discharge navigator,  Select New Orders & click on the button choice - Manage This Unsigned Work.     Thank you for allowing pharmacy to be a part of this patient's care.  Laurey Arrow  [facility] PharmD Candidate 2022 08/15/2020, 10:13 AM

## 2020-08-16 DIAGNOSIS — A419 Sepsis, unspecified organism: Secondary | ICD-10-CM | POA: Diagnosis not present

## 2020-08-16 LAB — COMPREHENSIVE METABOLIC PANEL
ALT: 21 U/L (ref 0–44)
AST: 18 U/L (ref 15–41)
Albumin: 2.2 g/dL — ABNORMAL LOW (ref 3.5–5.0)
Alkaline Phosphatase: 99 U/L (ref 38–126)
Anion gap: 8 (ref 5–15)
BUN: 12 mg/dL (ref 6–20)
CO2: 19 mmol/L — ABNORMAL LOW (ref 22–32)
Calcium: 8.4 mg/dL — ABNORMAL LOW (ref 8.9–10.3)
Chloride: 104 mmol/L (ref 98–111)
Creatinine, Ser: 0.43 mg/dL — ABNORMAL LOW (ref 0.44–1.00)
GFR, Estimated: >60 mL/min (ref >60)
Glucose, Bld: 128 mg/dL — ABNORMAL HIGH (ref 70–99)
Potassium: 4.8 mmol/L (ref 3.5–5.1)
Sodium: 131 mmol/L — ABNORMAL LOW (ref 135–145)
Total Bilirubin: 0.6 mg/dL (ref 0.3–1.2)
Total Protein: 5.3 g/dL — ABNORMAL LOW (ref 6.5–8.1)

## 2020-08-16 LAB — CULTURE, BLOOD (ROUTINE X 2)
Culture: NO GROWTH
Culture: NO GROWTH
Special Requests: ADEQUATE

## 2020-08-16 LAB — METHYLMALONIC ACID, SERUM: Methylmalonic Acid, Quantitative: 161 nmol/L (ref 0–378)

## 2020-08-16 LAB — HEMOGLOBIN AND HEMATOCRIT, BLOOD
HCT: 24.7 % — ABNORMAL LOW (ref 36.0–46.0)
Hemoglobin: 8 g/dL — ABNORMAL LOW (ref 12.0–15.0)

## 2020-08-16 LAB — GLUCOSE, CAPILLARY
Glucose-Capillary: 106 mg/dL — ABNORMAL HIGH (ref 70–99)
Glucose-Capillary: 103 mg/dL — ABNORMAL HIGH (ref 70–99)
Glucose-Capillary: 98 mg/dL (ref 70–99)
Glucose-Capillary: 85 mg/dL (ref 70–99)

## 2020-08-16 LAB — CREATININE, SERUM: Creatinine, Ser: <0.3 mg/dL — ABNORMAL LOW (ref 0.44–1.00)

## 2020-08-16 MED ORDER — CHLORHEXIDINE GLUCONATE CLOTH 2 % EX PADS
6.0000 | MEDICATED_PAD | Freq: Every day | CUTANEOUS | Status: DC
  Administered 2020-08-17 – 2020-09-02 (×17): 6 via TOPICAL

## 2020-08-16 MED ORDER — SODIUM CHLORIDE 0.9 % IV SOLN
1.0000 g | Freq: Three times a day (TID) | INTRAVENOUS | Status: DC
  Administered 2020-08-16 – 2020-08-21 (×15): 1 g via INTRAVENOUS
  Filled 2020-08-16 (×15): qty 1

## 2020-08-16 NOTE — Progress Notes (Signed)
Foley Catheter inserted F14 with 31ml balloon. Inserted without difficulty. Drained clear yellow urine.  Indication: ST4 Pressure Injury, sacrum. MEWS Score 4, MD aware seen pt. See new orders.

## 2020-08-16 NOTE — Progress Notes (Addendum)
PROGRESS NOTE    Tina Griffith  IOE:[id number] DOB: Dec 01, 1960 DOA: 08/11/2020 PCP: [facility]   Chief Complaint  Patient presents with  . sacral wound    Brief Narrative:  Tina Griffith is a 60 y.o. female with a history of MS, NIDT2DM, HTN, IBS-C who suffered covid-19 pneumonia in December 2021 with resultant bedbound status and developed sacral decubitus ulcer which became infected necessitating admission 06/16/2020 and debridement 1/27. Ultimately was discharged to [facility] 2/14, and [facility] from SNF due to family report of fever, malodorous discharge from the wound, AMS, and neglect from facility. on basis of family's feeling that the facility is neglecting her. In the ED she was febrile, tachycardic, tachypneic with leukocytosis, thrombocytosis. She had a large sacral decubitus ulcer (pictured in H&P) seen on exam with CT abd/pelvis revealing changes consistent with chronic osteomyelitis  without abscess or acute osteomyelitis. UA not consistent with infection and CXR without lobar infiltrate. Vancomycin, cefepime, and flagyl were given after blood cultures were drawn and she was admitted to [facility] based on bed availability with ID consulted.    Assessment & Plan:   Principal Problem:   Sepsis ([place]) Active Problems:   Constipation   Tobacco abuse   Anemia   AMS (altered mental status)   Diabetes ([place])   Fecal impaction (HCC)   Dark stools  Sepsis due to infected sacral decubitus ulcer with chronic osteomyelitis:  - Continue broad antimicrobial therapy pending ID consult and culture results - appreciate ID recommendations - recommending doxycyclin + unasyn -> if recurrent fever, recommended meropenem  - wound c/s - surgery planning to follow - not planning for diversion at this point  - Pain control as ordered - blood cx 3/21 NGTD - blood cx 3/25 pending - urine cx 3/22 with no growth  - Sed rate 65 from 92 on 06/21/20 - CRP 15.4 from 13.9 on 06/21/20 -  CXR 3/25 with scarring to RUL, lungs clear elsewhere - recurrent fever 3/26, will add meropenem per ID  Tachycardia EKG appears similar to priors, sinus tach, RBBB, LAFB Follow with IVF Likely related to fever, but ctm, w/u additionally prn - follow with change in IV abx above  Melena  Concern for Acute Blood Loss Anemia  Iron Def Anemia Low normal B12, follow MMA Serial Hb - relatively stable -fluctuating, will continue to follow daily Suggestive of iron def with aocd IV PPI BID Plan for endoscopy 3/24 AM with GI - she declined this - I think she's agreeable now, will discuss again with GI regarding timing of this -> though need to address her tachycardia/recurrent fevers first.  Will discuss with GI again early next week (Sunday/Monday).  Constipation with fecal ball on CT:  - appreciate GI assistance - bowel regimen - enemas, disimpaction  Multiple sclerosis:  Resume dalfampridine  RLE edema:  -negative [place] for DVT  HTN: Has been on metoprolol confirmed by last discharge summary. This will be reordered with tachycardia and hypertension  NIDT2DM: HbA1c 6.5% indicating good chronic control.  - SSI  Tobacco use: 1ppd.  - Cessation counseling, nicotine patch.  Concern for neglect at [facility].  - CSW consulted.   Acute Metabolic Encephalopathy H82, folate wnl Follow TSH (wnl) Likely 2/2 acute illness and MS Delirium precautions  IBS, GERD:  - Was on PPI previously.   RN Pressure Injury Documentation: Pressure Injury 06/17/20 Buttocks Right;Anterior Stage 4 - Full thickness tissue loss with exposed bone, tendon or muscle. Deep Oval shaped pressure wound (  6cmx4cmx3cm) muscle and bone exposed, areas of necrotic tissue at the edges, red & yellow inside w (Active)  06/17/20 2200  Location: Buttocks  Location Orientation: Right;Anterior (extending towards the sacrum)  Staging: Stage 4 - Full thickness tissue loss with exposed bone, tendon or muscle.  Wound Description  (Comments): Deep Oval shaped pressure wound (6cmx4cmx3cm) muscle and bone exposed, areas of necrotic tissue at the edges, red & yellow inside w/active bleeding & tunneling, foul smelling  Present on Admission: Yes   DVT prophylaxis: SCD Code Status:full  Family Communication: none at bedside - sister over phone 3/26 Disposition:   Status is: Inpatient  Remains inpatient appropriate because:Inpatient level of care appropriate due to severity of illness   Dispo: The patient is from: Home              Anticipated d/c is to: pending              Patient currently is not medically stable to d/c.   Difficult to place patient No   Consultants:   Surgery GI  ID   Procedures:   LE [place] Summary:  RIGHT:  - There is no evidence of deep vein thrombosis in the lower extremity.    - No cystic structure found in the popliteal fossa.    LEFT:  - No evidence of common femoral vein obstruction.      Antimicrobials:  Anti-infectives (From admission, onward)   Start     Dose/Rate Route Frequency Ordered Stop   08/14/20 2200  Ampicillin-Sulbactam (UNASYN) 3 g in sodium chloride 0.9 % 100 mL IVPB  Status:  Discontinued        3 g 200 mL/hr over 30 Minutes Intravenous Every 6 hours 08/14/20 1603 08/16/20 1512   08/14/20 2200  doxycycline (VIBRA-TABS) tablet 100 mg        100 mg Oral Every 12 hours 08/14/20 1603     08/12/20 1400  vancomycin (VANCOREADY) IVPB 1250 mg/250 mL  Status:  Discontinued        1,250 mg 166.7 mL/hr over 90 Minutes Intravenous Every 24 hours 08/11/20 1429 08/14/20 1603   08/11/20 2200  ceFEPIme (MAXIPIME) 2 g in sodium chloride 0.9 % 100 mL IVPB  Status:  Discontinued        2 g 200 mL/hr over 30 Minutes Intravenous Every 8 hours 08/11/20 1429 08/14/20 1603   08/11/20 2200  metroNIDAZOLE (FLAGYL) IVPB 500 mg  Status:  Discontinued        500 mg 100 mL/hr over 60 Minutes Intravenous Every 8 hours 08/11/20 2128 08/14/20 1603   08/11/20 1245  ceFEPIme  (MAXIPIME) 2 g in sodium chloride 0.9 % 100 mL IVPB        2 g 200 mL/hr over 30 Minutes Intravenous  Once 08/11/20 1236 08/11/20 1426   08/11/20 1245  metroNIDAZOLE (FLAGYL) IVPB 500 mg        500 mg 100 mL/hr over 60 Minutes Intravenous  Once 08/11/20 1236 08/11/20 1517   08/11/20 1245  vancomycin (VANCOREADY) IVPB 1500 mg/300 mL        1,500 mg 150 mL/hr over 120 Minutes Intravenous  Once 08/11/20 1236 08/11/20 1634      Subjective: Says she feels generally poorly, doesn't elaborate No other complaints  Objective: Vitals:   08/16/20 0531 08/16/20 0819 08/16/20 1249 08/16/20 1404  BP: 130/67  (!) 130/58 123/73  Pulse: (!) 102  (!) 122 (!) 124  Resp: 18  (!) 24 (!) 24  Temp:  98.6 F (37 C)  (!) 100.5 F (38.1 C) 98.9 F (37.2 C)  TempSrc: Oral  Oral Oral  SpO2: 98% 96% 99% 99%  Weight:      Height:        Intake/Output Summary (Last 24 hours) at 08/16/2020 1519 Last data filed at 08/16/2020 1300 Gross per 24 hour  Intake 3900.11 ml  Output 1802 ml  Net 2098.11 ml   Filed Weights   08/13/20 0900  Weight: 76.8 kg    Examination:  General: No acute distress. Cardiovascular: RRR Lungs: unlabored. Abdomen: Soft, nontender, nondistended Neurological: Alert. Cranial nerves II through XII grossly intact. Skin: stage IV decub without gross signs of worsening infections Extremities: No clubbing or cyanosis. No edema.   Data Reviewed: I have personally reviewed following labs and imaging studies  CBC: Recent Labs  Lab 08/11/20 1233 08/12/20 0344 08/12/20 1743 08/13/20 0050 08/13/20 1058 08/14/20 0102 08/14/20 0933 08/15/20 0028 08/15/20 0746 08/15/20 0755 08/15/20 1520 08/15/20 2356  WBC 20.0* 15.9*  --  19.2*  --  19.4*  --   --   --  20.8*  --   --   NEUTROABS 16.2*  --   --   --   --  15.7*  --   --   --  17.3*  --   --   HGB 9.0* 9.4*   < > 8.7*   < > 8.5*   < > 9.1* 8.3* 8.1* 8.7* 8.0*  HCT 28.2* 30.0*   < > 28.5*   < > 26.9*   < > 28.6* 25.9* 25.5*  28.0* 24.7*  MCV 79.0* 81.3  --  81.0  --  79.8*  --   --   --  78.0*  --   --   PLT 773* 717*  --  792*  --  751*  --   --   --  723*  --   --    < > = values in this interval not displayed.    Basic Metabolic Panel: Recent Labs  Lab 08/12/20 0344 08/13/20 0050 08/13/20 1058 08/14/20 0102 08/15/20 0755 08/15/20 0946 08/15/20 2356 08/16/20 1304  NA 136 137  --  134* 138  --   --  131*  K 3.4* 3.2*  --  3.7 2.6*  --   --  4.8  CL 106 109  --  108 111  --   --  104  CO2 19* 17*  --  16* 18*  --   --  19*  GLUCOSE 96 94  --  96 114*  --   --  128*  BUN 7 7  --  8 7  --   --  12  CREATININE 0.44 0.63  --  0.40* 0.41*  --  <0.30* 0.43*  CALCIUM 9.0 8.9  --  8.6* 7.7*  --   --  8.4*  MG  --   --  1.7 1.6*  --  1.6*  --   --   PHOS  --   --   --  1.9*  --   --   --   --     GFR: Estimated Creatinine Clearance: 80.9 mL/min (A) (by C-G formula based on SCr of 0.43 mg/dL (L)).  Liver Function Tests: Recent Labs  Lab 08/11/20 1233 08/14/20 0102 08/15/20 0755 08/16/20 1304  AST 17 10* 18 18  ALT 29 18 19 21   ALKPHOS 119 93 92 99  BILITOT 0.5 0.7 0.6 0.6  PROT 5.5* 5.2* 4.8* 5.3*  ALBUMIN 2.0* 2.2* 1.9* 2.2*    CBG: Recent Labs  Lab 08/15/20 1123 08/15/20 1631 08/15/20 2031 08/16/20 0725 08/16/20 1151  GLUCAP 103* 112* 152* 106* 103*     Recent Results (from the past 240 hour(s))  Urine Culture     Status: None   Collection Time: 08/11/20 12:33 PM   Specimen: In/Out Cath Urine  Result Value Ref Range Status   Specimen Description IN/OUT CATH URINE  Final   Special Requests NONE  Final   Culture   Final    NO GROWTH Performed at [facility], [place] [address] [place], [place] [address]    Report Status 08/14/2020 FINAL  Final  Blood Culture (routine x 2)     Status: None   Collection Time: 08/11/20 12:40 PM   Specimen: BLOOD LEFT HAND  Result Value Ref Range Status   Specimen Description BLOOD LEFT HAND  Final   Special Requests   Final    BOTTLES  DRAWN AEROBIC ONLY Blood Culture results may not be optimal due to an inadequate volume of blood received in culture bottles   Culture   Final    NO GROWTH 5 DAYS Performed at [facility], [place] [address] [place], [place] [address]    Report Status 08/16/2020 FINAL  Final  Blood Culture (routine x 2)     Status: None   Collection Time: 08/11/20 12:41 PM   Specimen: BLOOD RIGHT ARM  Result Value Ref Range Status   Specimen Description BLOOD RIGHT ARM  Final   Special Requests   Final    BOTTLES DRAWN AEROBIC AND ANAEROBIC Blood Culture adequate volume   Culture   Final    NO GROWTH 5 DAYS Performed at [facility], [place] [address] [place], [place] [address]    Report Status 08/16/2020 FINAL  Final  Resp Panel by RT-PCR (Flu A&B, Covid) Nasopharyngeal Swab     Status: None   Collection Time: 08/11/20  1:10 PM   Specimen: Nasopharyngeal Swab; Nasopharyngeal(NP) swabs in vial transport medium  Result Value Ref Range Status   SARS Coronavirus 2 by RT PCR NEGATIVE NEGATIVE Final    Comment: (NOTE) SARS-CoV-2 target nucleic acids are NOT DETECTED.  The SARS-CoV-2 RNA is generally detectable in upper respiratory specimens during the acute phase of infection. The lowest concentration of SARS-CoV-2 viral copies this assay can detect is 138 copies/mL. A negative result does not preclude SARS-Cov-2 infection and should not be used as the sole basis for treatment or other patient management decisions. A negative result may occur with  improper specimen collection/handling, submission of specimen other than nasopharyngeal swab, presence of viral mutation(s) within the areas targeted by this assay, and inadequate number of viral copies(<138 copies/mL). A negative result must be combined with clinical observations, patient history, and epidemiological information. The expected result is Negative.  Fact Sheet for Patients:  EntrepreneurPulse.com.au  Fact Sheet  for Healthcare Providers:  IncredibleEmployment.be  This test is no t yet approved or cleared by the [place] FDA and  has been authorized for detection and/or diagnosis of SARS-CoV-2 by FDA under an Emergency Use Authorization (EUA). This EUA will remain  in effect (meaning this test can be used) for the duration of the COVID-19 declaration under Section 564(b)(1) of the Act, 21 U.S.C.section 360bbb-3(b)(1), unless the authorization is terminated  or revoked sooner.       Influenza A by PCR NEGATIVE NEGATIVE Final  Influenza B by PCR NEGATIVE NEGATIVE Final    Comment: (NOTE) The Xpert Xpress SARS-CoV-2/FLU/RSV plus assay is intended as an aid in the diagnosis of influenza from Nasopharyngeal swab specimens and should not be used as a sole basis for treatment. Nasal washings and aspirates are unacceptable for Xpert Xpress SARS-CoV-2/FLU/RSV testing.  Fact Sheet for Patients: EntrepreneurPulse.com.au  Fact Sheet for Healthcare Providers: IncredibleEmployment.be  This test is not yet approved or cleared by the [place] FDA and has been authorized for detection and/or diagnosis of SARS-CoV-2 by FDA under an Emergency Use Authorization (EUA). This EUA will remain in effect (meaning this test can be used) for the duration of the COVID-19 declaration under Section 564(b)(1) of the Act, 21 U.S.C. section 360bbb-3(b)(1), unless the authorization is terminated or revoked.  Performed at [facility], [place] [address] [place], [place] [address]   MRSA PCR Screening     Status: None   Collection Time: 08/12/20 12:29 PM   Specimen: Nasal Mucosa; Nasopharyngeal  Result Value Ref Range Status   MRSA by PCR NEGATIVE NEGATIVE Final    Comment:        The GeneXpert MRSA Assay (FDA approved for NASAL specimens only), is one component of a comprehensive MRSA colonization surveillance program. It is not intended to  diagnose MRSA infection nor to guide or monitor treatment for MRSA infections. Performed at [facility], [place] [address] [place], [place] [address]   Urine culture     Status: None   Collection Time: 08/12/20  1:34 PM   Specimen: Urine, Catheterized  Result Value Ref Range Status   Specimen Description   Final    URINE, CATHETERIZED Performed at [place] [address] [place], [place] [address]    Special Requests   Final    NONE Performed at [facility], [place] [address], [place], [place] [address]    Culture   Final    NO GROWTH Performed at [facility], [place] [address] [place], [place] [address]    Report Status 08/13/2020 FINAL  Final  Culture, blood (routine x 2)     Status: None (Preliminary result)   Collection Time: 08/15/20  3:20 PM   Specimen: BLOOD RIGHT HAND  Result Value Ref Range Status   Specimen Description   Final    BLOOD RIGHT HAND Performed at [place] [address] [place], [place] [address]    Special Requests   Final    BOTTLES DRAWN AEROBIC ONLY Blood Culture adequate volume Performed at [place] [address], [place], [place] [address]    Culture   Final    NO GROWTH < 24 HOURS Performed at [place] [address], [place], [place] [address]    Report Status PENDING  Incomplete  Culture, blood (routine x 2)     Status: None (Preliminary result)   Collection Time: 08/15/20  3:20 PM   Specimen: BLOOD LEFT HAND  Result Value Ref Range Status   Specimen Description   Final    BLOOD LEFT HAND Performed at [place] [address] [place], [place] [address]    Special Requests   Final    BOTTLES DRAWN AEROBIC ONLY Blood Culture adequate volume Performed at [place] [address], [place], [place] [address]    Culture   Final    NO GROWTH < 24 HOURS Performed at  [facility], [place] [address], [place], [place] [address]    Report Status PENDING  Incomplete         Radiology Studies: DG CHEST PORT 1 VIEW  Result Date: 08/15/2020 CLINICAL DATA:  Fever and weakness EXAM: PORTABLE CHEST 1 VIEW COMPARISON:  August 11, 2020 FINDINGS: There is scarring in the right upper lobe. There is no edema or airspace opacity. Heart size and pulmonary vascularity are normal. No adenopathy. No bone lesions. IMPRESSION: Scarring right upper lobe. Lungs elsewhere clear. Cardiac silhouette within normal limits. Electronically Signed   By: Lowella Grip III M.D.   On: 08/15/2020 13:51        Scheduled Meds: . vitamin C  500 mg Oral Daily  . Chlorhexidine Gluconate Cloth  6 each Topical Daily  . dalfampridine  10 mg Oral BID  . doxycycline  100 mg Oral Q12H  . feeding supplement  237 mL Oral BID BM  . insulin aspart  0-5 Units Subcutaneous QHS  . insulin aspart  0-9 Units Subcutaneous TID WC  . metoprolol succinate  100 mg Oral Daily  . mometasone-formoterol  2 puff Inhalation BID  . nicotine  21 mg Transdermal Daily  . nutrition supplement (JUVEN)  1 packet Oral BID BM  . nystatin  5 mL Oral QID  . oxybutynin  10 mg Oral Daily  . pantoprazole (PROTONIX) IV  40 mg Intravenous Q12H  . polyethylene glycol  17 g Oral BID  . senna-docusate  1 tablet Oral QHS  . sorbitol, milk of mag, mineral oil, glycerin (SMOG) enema  960 mL Rectal BID  . zinc sulfate  220 mg Oral Daily   Continuous Infusions:    LOS: 5 days    Time spent: over 30 min    Fayrene Helper, MD [facility]   To contact the attending provider between 7A-7P or the covering provider during after hours 7P-7A, please log into the web site [email or web address] and access using universal [place] password for that web site. If you do not have the password, please call the hospital operator.  08/16/2020, 3:19 PM

## 2020-08-16 NOTE — Progress Notes (Signed)
Pharmacy Antibiotic Note  Tina Griffith is a 60 y.o. female admitted on 08/11/2020 with recurrent osteo.  Pharmacy has been consulted for meropenem dosing after fever with unasyn/doxy and per ID instructions, would change to meropenem if develops fever  Plan: Meropenem 1g IV q8 per current renal function  Height: 5\' 7"  (170.2 cm) Weight: 76.8 kg (169 lb 5 oz) IBW/kg (Calculated) : 61.6  Temp (24hrs), Avg:98.8 F (37.1 C), Min:98.1 F (36.7 C), Max:100.5 F (38.1 C)  Recent Labs  Lab 08/11/20 1233 08/11/20 1314 08/12/20 0344 08/13/20 0050 08/14/20 0102 08/15/20 0755 08/15/20 2356 08/16/20 1304  WBC 20.0*  --  15.9* 19.2* 19.4* 20.8*  --   --   CREATININE 0.52  --  0.44 0.63 0.40* 0.41* <0.30* 0.43*  LATICACIDVEN  --  1.5  --   --   --   --   --   --     Estimated Creatinine Clearance: 80.9 mL/min (A) (by C-G formula based on SCr of 0.43 mg/dL (L)).    Allergies  Allergen Reactions  . No Known Allergies       Thank you for allowing pharmacy to be a part of this patient's care.  Kara Mead 08/16/2020 3:22 PM

## 2020-08-17 ENCOUNTER — Inpatient Hospital Stay (HOSPITAL_COMMUNITY): Payer: Medicare Other

## 2020-08-17 DIAGNOSIS — M4628 Osteomyelitis of vertebra, sacral and sacrococcygeal region: Secondary | ICD-10-CM | POA: Diagnosis present

## 2020-08-17 DIAGNOSIS — R509 Fever, unspecified: Secondary | ICD-10-CM | POA: Diagnosis not present

## 2020-08-17 DIAGNOSIS — K5900 Constipation, unspecified: Secondary | ICD-10-CM | POA: Diagnosis not present

## 2020-08-17 DIAGNOSIS — A419 Sepsis, unspecified organism: Secondary | ICD-10-CM | POA: Diagnosis not present

## 2020-08-17 DIAGNOSIS — K5909 Other constipation: Secondary | ICD-10-CM | POA: Diagnosis not present

## 2020-08-17 LAB — GLUCOSE, CAPILLARY
Glucose-Capillary: 97 mg/dL (ref 70–99)
Glucose-Capillary: 142 mg/dL — ABNORMAL HIGH (ref 70–99)
Glucose-Capillary: 155 mg/dL — ABNORMAL HIGH (ref 70–99)
Glucose-Capillary: 166 mg/dL — ABNORMAL HIGH (ref 70–99)

## 2020-08-17 LAB — COMPREHENSIVE METABOLIC PANEL
ALT: 18 U/L (ref 0–44)
AST: 10 U/L — ABNORMAL LOW (ref 15–41)
Albumin: 2 g/dL — ABNORMAL LOW (ref 3.5–5.0)
Alkaline Phosphatase: 97 U/L (ref 38–126)
Anion gap: 9 (ref 5–15)
BUN: 13 mg/dL (ref 6–20)
CO2: 21 mmol/L — ABNORMAL LOW (ref 22–32)
Calcium: 8.8 mg/dL — ABNORMAL LOW (ref 8.9–10.3)
Chloride: 106 mmol/L (ref 98–111)
Creatinine, Ser: 0.41 mg/dL — ABNORMAL LOW (ref 0.44–1.00)
GFR, Estimated: >60 mL/min (ref >60)
Glucose, Bld: 92 mg/dL (ref 70–99)
Potassium: 4 mmol/L (ref 3.5–5.1)
Sodium: 136 mmol/L (ref 135–145)
Total Bilirubin: 0.5 mg/dL (ref 0.3–1.2)
Total Protein: 5 g/dL — ABNORMAL LOW (ref 6.5–8.1)

## 2020-08-17 LAB — CBC WITH DIFFERENTIAL/PLATELET
Abs Immature Granulocytes: 0.28 10*3/uL — ABNORMAL HIGH (ref 0.00–0.07)
Basophils Absolute: 0.1 10*3/uL (ref 0.0–0.1)
Basophils Relative: 0 %
Eosinophils Absolute: 0 10*3/uL (ref 0.0–0.5)
Eosinophils Relative: 0 %
HCT: 27.1 % — ABNORMAL LOW (ref 36.0–46.0)
Hemoglobin: 8.6 g/dL — ABNORMAL LOW (ref 12.0–15.0)
Immature Granulocytes: 2 %
Lymphocytes Relative: 10 %
Lymphs Abs: 1.9 10*3/uL (ref 0.7–4.0)
MCH: 24.7 pg — ABNORMAL LOW (ref 26.0–34.0)
MCHC: 31.7 g/dL (ref 30.0–36.0)
MCV: 77.9 fL — ABNORMAL LOW (ref 80.0–100.0)
Monocytes Absolute: 1.2 10*3/uL — ABNORMAL HIGH (ref 0.1–1.0)
Monocytes Relative: 6 %
Neutro Abs: 15.2 10*3/uL — ABNORMAL HIGH (ref 1.7–7.7)
Neutrophils Relative %: 82 %
Platelets: 708 10*3/uL — ABNORMAL HIGH (ref 150–400)
RBC: 3.48 MIL/uL — ABNORMAL LOW (ref 3.87–5.11)
RDW: 19.8 % — ABNORMAL HIGH (ref 11.5–15.5)
WBC: 18.6 10*3/uL — ABNORMAL HIGH (ref 4.0–10.5)
nRBC: 0.2 % (ref 0.0–0.2)

## 2020-08-17 LAB — TROPONIN I (HIGH SENSITIVITY)
Troponin I (High Sensitivity): 3 ng/L (ref <18)
Troponin I (High Sensitivity): 3 ng/L (ref <18)

## 2020-08-17 LAB — PHOSPHORUS: Phosphorus: 2.8 mg/dL (ref 2.5–4.6)

## 2020-08-17 LAB — MAGNESIUM: Magnesium: 1.7 mg/dL (ref 1.7–2.4)

## 2020-08-17 MED ORDER — SODIUM CHLORIDE 0.9 % IV BOLUS
500.0000 mL | Freq: Once | INTRAVENOUS | Status: AC
  Administered 2020-08-17: 500 mL via INTRAVENOUS

## 2020-08-17 MED ORDER — IOHEXOL 350 MG/ML SOLN
80.0000 mL | Freq: Once | INTRAVENOUS | Status: AC | PRN
  Administered 2020-08-17: 80 mL via INTRAVENOUS

## 2020-08-17 NOTE — Progress Notes (Signed)
[facility] for Infectious Disease    Date of Admission:  08/11/2020   Total days of antibiotics 7/day 2 meropenem           ID: Tina Griffith is a 60 y.o. female with partial treated sacral osteomyelitis Principal Problem:   Sepsis ([place]) Active Problems:   Constipation   Tobacco abuse   Anemia   AMS (altered mental status)   Diabetes ([place])   Fecal impaction (HCC)   Dark stools    Subjective: Still ongoing fevers up to 102.67F last night. Reports having pain on her buttocks, denies diarrhea + constipation.   No headache, cough, n/v. New rash  Medications:  . vitamin C  500 mg Oral Daily  . Chlorhexidine Gluconate Cloth  6 each Topical Daily  . dalfampridine  10 mg Oral BID  . feeding supplement  237 mL Oral BID BM  . insulin aspart  0-5 Units Subcutaneous QHS  . insulin aspart  0-9 Units Subcutaneous TID WC  . metoprolol succinate  100 mg Oral Daily  . mometasone-formoterol  2 puff Inhalation BID  . nicotine  21 mg Transdermal Daily  . nutrition supplement (JUVEN)  1 packet Oral BID BM  . nystatin  5 mL Oral QID  . oxybutynin  10 mg Oral Daily  . pantoprazole (PROTONIX) IV  40 mg Intravenous Q12H  . polyethylene glycol  17 g Oral BID  . senna-docusate  1 tablet Oral QHS  . sorbitol, milk of mag, mineral oil, glycerin (SMOG) enema  960 mL Rectal BID  . zinc sulfate  220 mg Oral Daily    Objective: Vital signs in last 24 hours: Temp:  [97.7 F (36.5 C)-102.8 F (39.3 C)] 100 F (37.8 C) (03/27 1345) Pulse Rate:  [112-128] 127 (03/27 1303) Resp:  [18-24] 19 (03/27 1303) BP: (110-131)/(54-74) 119/60 (03/27 1303) SpO2:  [96 %-100 %] 98 % (03/27 1303)  Physical Exam  Constitutional:  oriented to person, place, and time. appears well-developed and well-nourished. No distress.  HENT: [place]/AT, PERRLA, no scleral icterus Mouth/Throat: Oropharynx is clear and moist. No oropharyngeal exudate.  Cardiovascular: Normal rate, regular rhythm and normal heart sounds.  Exam reveals no gallop and no friction rub.  No murmur heard.  Pulmonary/Chest: Effort normal and breath sounds normal. No respiratory distress.  has no wheezes.  Neck = supple, no nuchal rigidity Abdominal: Soft. Bowel sounds are normal.  exhibits no distension. There is no tenderness.  Lymphadenopathy: no cervical adenopathy. No axillary adenopathy Neurological: alert and oriented to person, place, and time. Limited movement to extremities due to MS Skin: Skin is warm and dry. No rash noted. No erythema.  Psychiatric: a normal mood and affect.  behavior is normal.    Lab Results Recent Labs    08/15/20 0755 08/15/20 1520 08/15/20 2356 08/16/20 1304 08/17/20 0449  WBC 20.8*  --   --   --  18.6*  HGB 8.1*   < > 8.0*  --  8.6*  HCT 25.5*   < > 24.7*  --  27.1*  NA 138  --   --  131* 136  K 2.6*  --   --  4.8 4.0  CL 111  --   --  104 106  CO2 18*  --   --  19* 21*  BUN 7  --   --  12 13  CREATININE 0.41*  --  <0.30* 0.43* 0.41*   < > = values in this interval not displayed.  Liver Panel Recent Labs    08/16/20 1304 08/17/20 0449  PROT 5.3* 5.0*  ALBUMIN 2.2* 2.0*  AST 18 10*  ALT 21 18  ALKPHOS 99 97  BILITOT 0.6 0.5   Lab Results  Component Value Date   ESRSEDRATE 65 (H) 08/13/2020    Microbiology: reviewed Studies/Results: CT ANGIO CHEST PE W OR WO CONTRAST  Result Date: 08/17/2020 CLINICAL DATA:  Chest pain, shortness of breath question pulmonary embolism. History multiple sclerosis, hypertension, smoker EXAM: CT ANGIOGRAPHY CHEST WITH CONTRAST TECHNIQUE: Multidetector CT imaging of the chest was performed using the standard protocol during bolus administration of intravenous contrast. Multiplanar CT image reconstructions and MIPs were obtained to evaluate the vascular anatomy. CONTRAST:  78mL OMNIPAQUE IOHEXOL 350 MG/ML SOLN IV COMPARISON:  05/13/2020 FINDINGS: Cardiovascular: Mild atherosclerotic calcifications at the origins of the great vessels. Aorta normal  caliber without aneurysm or dissection. Heart unremarkable. No pericardial effusion. Pulmonary arteries adequately opacified. No evidence of pulmonary embolism. Mediastinum/Nodes: Esophagus unremarkable. No thoracic adenopathy. Small LEFT thyroid nodules. Heterogeneous large nodule enlarging the RIGHT thyroid lobe, 3.5 x 2.8 cm; recommend thyroid ultrasound (ref: J Am Coll Radiol. 2015 Feb;12(2): 143-50). Lungs/Pleura: Emphysematous changes. Chronic parenchymal scarring in the upper lobes greater on RIGHT. No acute infiltrate, pleural effusion, or pneumothorax. Upper Abdomen: No acute upper abdominal findings. Musculoskeletal: Unremarkable Review of the MIP images confirms the above findings. IMPRESSION: No evidence of pulmonary embolism. No acute intrathoracic abnormalities. Emphysematous changes with BILATERAL upper lobe scarring greater on RIGHT. RIGHT thyroid mass 2.8 x 3.5 cm; recommend thyroid ultrasound assessment. Aortic Atherosclerosis (ICD10-I70.0) and Emphysema (ICD10-J43.9). Electronically Signed   By: Lavonia Dana M.D.   On: 08/17/2020 15:04     Assessment/Plan: Fever of unknown origin = abtx escalated to meropenem yesterday more so for coverage of sacral wound but need to consider other sources such as PE (which was ruled out today), consider ultrasound of lower extremities for DVT. Follow up on blood cx. Vs non-infectious causes  Dr Gale Journey to see tomorrow to provider further [place] for Infectious Diseases Cell: [phone] Pager: [phone]  08/17/2020, 3:07 PM
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
## 2020-08-18 ENCOUNTER — Inpatient Hospital Stay (HOSPITAL_COMMUNITY): Payer: Medicare Other

## 2020-08-18 DIAGNOSIS — M4628 Osteomyelitis of vertebra, sacral and sacrococcygeal region: Secondary | ICD-10-CM | POA: Diagnosis not present

## 2020-08-18 DIAGNOSIS — R509 Fever, unspecified: Secondary | ICD-10-CM | POA: Diagnosis not present

## 2020-08-18 DIAGNOSIS — A419 Sepsis, unspecified organism: Secondary | ICD-10-CM | POA: Diagnosis not present

## 2020-08-18 LAB — PHOSPHORUS: Phosphorus: 2.7 mg/dL (ref 2.5–4.6)

## 2020-08-18 LAB — GLUCOSE, CAPILLARY
Glucose-Capillary: 109 mg/dL — ABNORMAL HIGH (ref 70–99)
Glucose-Capillary: 101 mg/dL — ABNORMAL HIGH (ref 70–99)
Glucose-Capillary: 102 mg/dL — ABNORMAL HIGH (ref 70–99)
Glucose-Capillary: 129 mg/dL — ABNORMAL HIGH (ref 70–99)

## 2020-08-18 LAB — CBC WITH DIFFERENTIAL/PLATELET
Abs Immature Granulocytes: 0.34 10*3/uL — ABNORMAL HIGH (ref 0.00–0.07)
Basophils Absolute: 0.1 10*3/uL (ref 0.0–0.1)
Basophils Relative: 0 %
Eosinophils Absolute: 0 10*3/uL (ref 0.0–0.5)
Eosinophils Relative: 0 %
HCT: 26.2 % — ABNORMAL LOW (ref 36.0–46.0)
Hemoglobin: 8.4 g/dL — ABNORMAL LOW (ref 12.0–15.0)
Immature Granulocytes: 2 %
Lymphocytes Relative: 10 %
Lymphs Abs: 2 10*3/uL (ref 0.7–4.0)
MCH: 24.6 pg — ABNORMAL LOW (ref 26.0–34.0)
MCHC: 32.1 g/dL (ref 30.0–36.0)
MCV: 76.6 fL — ABNORMAL LOW (ref 80.0–100.0)
Monocytes Absolute: 1.4 10*3/uL — ABNORMAL HIGH (ref 0.1–1.0)
Monocytes Relative: 7 %
Neutro Abs: 15.4 10*3/uL — ABNORMAL HIGH (ref 1.7–7.7)
Neutrophils Relative %: 81 %
Platelets: 707 10*3/uL — ABNORMAL HIGH (ref 150–400)
RBC: 3.42 MIL/uL — ABNORMAL LOW (ref 3.87–5.11)
RDW: 19.6 % — ABNORMAL HIGH (ref 11.5–15.5)
WBC: 19.1 10*3/uL — ABNORMAL HIGH (ref 4.0–10.5)
nRBC: 0.2 % (ref 0.0–0.2)

## 2020-08-18 LAB — COMPREHENSIVE METABOLIC PANEL
ALT: 18 U/L (ref 0–44)
AST: 13 U/L — ABNORMAL LOW (ref 15–41)
Albumin: 2.1 g/dL — ABNORMAL LOW (ref 3.5–5.0)
Alkaline Phosphatase: 100 U/L (ref 38–126)
Anion gap: 10 (ref 5–15)
BUN: 20 mg/dL (ref 6–20)
CO2: 22 mmol/L (ref 22–32)
Calcium: 8.9 mg/dL (ref 8.9–10.3)
Chloride: 107 mmol/L (ref 98–111)
Creatinine, Ser: 0.53 mg/dL (ref 0.44–1.00)
GFR, Estimated: >60 mL/min (ref >60)
Glucose, Bld: 100 mg/dL — ABNORMAL HIGH (ref 70–99)
Potassium: 3.9 mmol/L (ref 3.5–5.1)
Sodium: 139 mmol/L (ref 135–145)
Total Bilirubin: 0.5 mg/dL (ref 0.3–1.2)
Total Protein: 5.2 g/dL — ABNORMAL LOW (ref 6.5–8.1)

## 2020-08-18 LAB — MAGNESIUM: Magnesium: 1.7 mg/dL (ref 1.7–2.4)

## 2020-08-18 LAB — C-REACTIVE PROTEIN: CRP: 13.6 mg/dL — ABNORMAL HIGH (ref <1.0)

## 2020-08-18 LAB — SEDIMENTATION RATE: Sed Rate: 105 mm/hr — ABNORMAL HIGH (ref 0–22)

## 2020-08-18 LAB — PROCALCITONIN: Procalcitonin: 1.73 ng/mL

## 2020-08-18 MED ORDER — VANCOMYCIN HCL 1000 MG/200ML IV SOLN: 1000.0000 mg | Freq: Two times a day (BID) | INTRAVENOUS | Status: DC

## 2020-08-18 MED ORDER — VANCOMYCIN HCL 1500 MG/300ML IV SOLN
1500.0000 mg | Freq: Once | INTRAVENOUS | Status: DC
  Administered 2020-08-18: 1500 mg via INTRAVENOUS
  Filled 2020-08-18: qty 300

## 2020-08-18 MED ORDER — OXYCODONE HCL 5 MG PO TABS
5.0000 mg | ORAL_TABLET | Freq: Four times a day (QID) | ORAL | Status: DC | PRN
  Administered 2020-08-18 – 2020-09-02 (×23): 5 mg via ORAL
  Filled 2020-08-18 (×24): qty 1

## 2020-08-18 MED ORDER — ACETAMINOPHEN 500 MG PO TABS
1000.0000 mg | ORAL_TABLET | Freq: Once | ORAL | Status: AC
  Administered 2020-08-18: 1000 mg via ORAL
  Filled 2020-08-18: qty 2

## 2020-08-18 NOTE — Progress Notes (Signed)
PROGRESS NOTE    Zoye Chandra  HKV:[id number] DOB: 1960-12-17 DOA: 08/11/2020 PCP: [facility]   Chief Complaint  Patient presents with  . sacral wound    Brief Narrative:  Kassy Mcenroe is Eartha Vonbehren 60 y.o. female with Aubrianna Orchard history of MS, NIDT2DM, HTN, IBS-C who suffered covid-19 pneumonia in December 2021 with resultant bedbound status and developed sacral decubitus ulcer which became infected necessitating admission 06/16/2020 and debridement 1/27. Ultimately was discharged to [facility] 2/14, and [facility] from SNF due to family report of fever, malodorous discharge from the wound, AMS, and neglect from facility. on basis of family's feeling that the facility is neglecting her. In the ED she was febrile, tachycardic, tachypneic with leukocytosis, thrombocytosis. She had Ellianna Ruest large sacral decubitus ulcer (pictured in H&P) seen on exam with CT abd/pelvis revealing changes consistent with chronic osteomyelitis  without abscess or acute osteomyelitis. UA not consistent with infection and CXR without lobar infiltrate. Vancomycin, cefepime, and flagyl were given after blood cultures were drawn and she was admitted to [facility] based on bed availability with ID consulted.    Assessment & Plan:   Principal Problem:   Sepsis ([place]) Active Problems:   Constipation   Tobacco abuse   Anemia   AMS (altered mental status)   Diabetes ([place])   Fecal impaction (HCC)   Dark stools   Sacral osteomyelitis (HCC)  Sepsis due to infected sacral decubitus ulcer with chronic osteomyelitis:  - Continue broad antimicrobial therapy pending ID consult and culture results - appreciate ID recommendations - recommending doxycyclin + unasyn -> if recurrent fever, recommended meropenem - continuing meropenem per ID - wound c/s - surgery planning to follow - not planning for diversion at this point  - Pain control as ordered - blood cx 3/21 NGTD - blood cx 3/25 pending - urine cx 3/22 with no growth  - Sed rate  65 from 92 on 06/21/20 - CRP 15.4 from 13.9 on 06/21/20 - CXR 3/25 with scarring to RUL, lungs clear elsewhere - CT abd 3/21 with large sacral decub with cortical irregularity which could be due to chronic osteo  - recurrent fever 3/27 am - follow CT chest below  Recurrent Fevers - suspect 2/2 above - appreciate ID recs given fevers despite abx  - dopplers to r/o DVT - ID c/s, appreciate recs --- recommending repeat quant gold, crp q72.  Consider peripheral flow cytometry, consider need for repeat echo.   Tachycardia  Chest Discomfort  SOB EKG appears similar to priors, sinus tach, RBBB, LAFB Follow troponin (negative) and repeat EKG - stable Follow CT PE protocol - negative for PE or acute intrathoracic abnormalities Likely related to fever, but ctm, w/u additionally prn - follow with change in IV abx above  Melena  Concern for Acute Blood Loss Anemia  Iron Def Anemia Low normal B12, follow MMA Serial Hb - relatively stable -fluctuating, will continue to follow daily Suggestive of iron def with aocd IV PPI BID Plan for endoscopy 3/24 AM with GI - she declined this - I think she's agreeable now, will discuss again with GI regarding timing of this -> though need to address her tachycardia/recurrent fevers first.  Will discuss with GI again once things are Manoah Deckard bit more stable from fever/tachy standpoint.  Constipation with fecal ball on CT:  - appreciate GI assistance - bowel regimen - s/p enemas, disimpaction  Multiple sclerosis:  Resume dalfampridine  RLE edema:  -negative [place] for DVT  HTN: Has been on  metoprolol confirmed by last discharge summary. This will be reordered with tachycardia and hypertension  NIDT2DM: HbA1c 6.5% indicating good chronic control.  - SSI  Tobacco use: 1ppd.  - Cessation counseling, nicotine patch.  Concern for neglect at [facility].  - CSW consulted.   Acute Metabolic Encephalopathy B28, folate wnl Follow TSH (wnl) Likely 2/2 acute illness  and MS Delirium precautions  IBS, GERD:  - Was on PPI previously.   RN Pressure Injury Documentation: Pressure Injury 06/17/20 Buttocks Right;Anterior Stage 4 - Full thickness tissue loss with exposed bone, tendon or muscle. Deep Oval shaped pressure wound (6cmx4cmx3cm) muscle and bone exposed, areas of necrotic tissue at the edges, red & yellow inside w (Active)  06/17/20 2200  Location: Buttocks  Location Orientation: Right;Anterior (extending towards the sacrum)  Staging: Stage 4 - Full thickness tissue loss with exposed bone, tendon or muscle.  Wound Description (Comments): Deep Oval shaped pressure wound (6cmx4cmx3cm) muscle and bone exposed, areas of necrotic tissue at the edges, red & yellow inside w/active bleeding & tunneling, foul smelling  Present on Admission: Yes   DVT prophylaxis: SCD Code Status:full  Family Communication: none at bedside - sister over phone 3/26 Disposition:   Status is: Inpatient  Remains inpatient appropriate because:Inpatient level of care appropriate due to severity of illness   Dispo: The patient is from: Home              Anticipated d/c is to: pending              Patient currently is not medically stable to d/c.   Difficult to place patient No   Consultants:   Surgery GI  ID   Procedures:   LE [place] Summary:  RIGHT:  - There is no evidence of deep vein thrombosis in the lower extremity.    - No cystic structure found in the popliteal fossa.    LEFT:  - No evidence of common femoral vein obstruction.      Antimicrobials:  Anti-infectives (From admission, onward)   Start     Dose/Rate Route Frequency Ordered Stop   08/19/20 0600  vancomycin (VANCOREADY) IVPB 1000 mg/200 mL  Status:  Discontinued        1,000 mg 200 mL/hr over 60 Minutes Intravenous Every 12 hours 08/18/20 0733 08/18/20 1244   08/18/20 1000  vancomycin (VANCOREADY) IVPB 1500 mg/300 mL  Status:  Discontinued        1,500 mg 150 mL/hr over 120 Minutes  Intravenous  Once 08/18/20 0733 08/18/20 1243   08/16/20 1800  meropenem (MERREM) 1 g in sodium chloride 0.9 % 100 mL IVPB        1 g 200 mL/hr over 30 Minutes Intravenous Every 8 hours 08/16/20 1524     08/14/20 2200  Ampicillin-Sulbactam (UNASYN) 3 g in sodium chloride 0.9 % 100 mL IVPB  Status:  Discontinued        3 g 200 mL/hr over 30 Minutes Intravenous Every 6 hours 08/14/20 1603 08/16/20 1512   08/14/20 2200  doxycycline (VIBRA-TABS) tablet 100 mg  Status:  Discontinued        100 mg Oral Every 12 hours 08/14/20 1603 08/16/20 1521   08/12/20 1400  vancomycin (VANCOREADY) IVPB 1250 mg/250 mL  Status:  Discontinued        1,250 mg 166.7 mL/hr over 90 Minutes Intravenous Every 24 hours 08/11/20 1429 08/14/20 1603   08/11/20 2200  ceFEPIme (MAXIPIME) 2 g in sodium chloride 0.9 % 100 mL IVPB  Status:  Discontinued        2 g 200 mL/hr over 30 Minutes Intravenous Every 8 hours 08/11/20 1429 08/14/20 1603   08/11/20 2200  metroNIDAZOLE (FLAGYL) IVPB 500 mg  Status:  Discontinued        500 mg 100 mL/hr over 60 Minutes Intravenous Every 8 hours 08/11/20 2128 08/14/20 1603   08/11/20 1245  ceFEPIme (MAXIPIME) 2 g in sodium chloride 0.9 % 100 mL IVPB        2 g 200 mL/hr over 30 Minutes Intravenous  Once 08/11/20 1236 08/11/20 1426   08/11/20 1245  metroNIDAZOLE (FLAGYL) IVPB 500 mg        500 mg 100 mL/hr over 60 Minutes Intravenous  Once 08/11/20 1236 08/11/20 1517   08/11/20 1245  vancomycin (VANCOREADY) IVPB 1500 mg/300 mL        1,500 mg 150 mL/hr over 120 Minutes Intravenous  Once 08/11/20 1236 08/11/20 1634      Subjective: No new complaints  Objective: Vitals:   08/18/20 0515 08/18/20 0818 08/18/20 0938 08/18/20 1324  BP: (!) 122/58  135/63 (!) 116/57  Pulse: (!) 127  (!) 120 (!) 103  Resp: 18  (!) 25 (!) 25  Temp: 98.7 F (37.1 C)  99 F (37.2 C) 98.1 F (36.7 C)  TempSrc: Oral  Oral Axillary  SpO2: 95% 96% 100% 99%  Weight:      Height:        Intake/Output  Summary (Last 24 hours) at 08/18/2020 1413 Last data filed at 08/18/2020 0815 Gross per 24 hour  Intake 1000.24 ml  Output 1300 ml  Net -299.76 ml   Filed Weights   08/13/20 0900  Weight: 76.8 kg    Examination:  General: No acute distress. Cardiovascular: tachycardic Lungs: Clear to auscultation bilaterally Abdomen: Soft, nontender, nondistended Neurological: Alert. Moves upper extremities > LE Skin: deferred decub exam as recent dressing change, asked nursing to call back for afternoon dressing change. Extremities: No clubbing or cyanosis. No edema.   Data Reviewed: I have personally reviewed following labs and imaging studies  CBC: Recent Labs  Lab 08/13/20 0050 08/13/20 1058 08/14/20 0102 08/14/20 0933 08/15/20 0755 08/15/20 1520 08/15/20 2356 08/17/20 0449 08/18/20 0447  WBC 19.2*  --  19.4*  --  20.8*  --   --  18.6* 19.1*  NEUTROABS  --   --  15.7*  --  17.3*  --   --  15.2* 15.4*  HGB 8.7*   < > 8.5*   < > 8.1* 8.7* 8.0* 8.6* 8.4*  HCT 28.5*   < > 26.9*   < > 25.5* 28.0* 24.7* 27.1* 26.2*  MCV 81.0  --  79.8*  --  78.0*  --   --  77.9* 76.6*  PLT 792*  --  751*  --  723*  --   --  708* 707*   < > = values in this interval not displayed.    Basic Metabolic Panel: Recent Labs  Lab 08/13/20 1058 08/14/20 0102 08/15/20 0755 08/15/20 0946 08/15/20 2356 08/16/20 1304 08/17/20 0449 08/18/20 0447  NA  --  134* 138  --   --  131* 136 139  K  --  3.7 2.6*  --   --  4.8 4.0 3.9  CL  --  108 111  --   --  104 106 107  CO2  --  16* 18*  --   --  19* 21* 22  GLUCOSE  --  96 114*  --   --  128* 92 100*  BUN  --  8 7  --   --  12 13 20   CREATININE  --  0.40* 0.41*  --  <0.30* 0.43* 0.41* 0.53  CALCIUM  --  8.6* 7.7*  --   --  8.4* 8.8* 8.9  MG 1.7 1.6*  --  1.6*  --   --  1.7 1.7  PHOS  --  1.9*  --   --   --   --  2.8 2.7    GFR: Estimated Creatinine Clearance: 80.9 mL/min (by C-G formula based on SCr of 0.53 mg/dL).  Liver Function Tests: Recent Labs   Lab 08/14/20 0102 08/15/20 0755 08/16/20 1304 08/17/20 0449 08/18/20 0447  AST 10* 18 18 10* 13*  ALT 18 19 21 18 18   ALKPHOS 93 92 99 97 100  BILITOT 0.7 0.6 0.6 0.5 0.5  PROT 5.2* 4.8* 5.3* 5.0* 5.2*  ALBUMIN 2.2* 1.9* 2.2* 2.0* 2.1*    CBG: Recent Labs  Lab 08/17/20 1130 08/17/20 1748 08/17/20 2124 08/18/20 0757 08/18/20 1217  GLUCAP 142* 155* 166* 109* 101*     Recent Results (from the past 240 hour(s))  Urine Culture     Status: None   Collection Time: 08/11/20 12:33 PM   Specimen: In/Out Cath Urine  Result Value Ref Range Status   Specimen Description IN/OUT CATH URINE  Final   Special Requests NONE  Final   Culture   Final    NO GROWTH Performed at [facility], [place] [address], [place], [place] [address]    Report Status 08/14/2020 FINAL  Final  Blood Culture (routine x 2)     Status: None   Collection Time: 08/11/20 12:40 PM   Specimen: BLOOD LEFT HAND  Result Value Ref Range Status   Specimen Description BLOOD LEFT HAND  Final   Special Requests   Final    BOTTLES DRAWN AEROBIC ONLY Blood Culture results may not be optimal due to an inadequate volume of blood received in culture bottles   Culture   Final    NO GROWTH 5 DAYS Performed at [facility], [place] [address], [place], [place] [address]    Report Status 08/16/2020 FINAL  Final  Blood Culture (routine x 2)     Status: None   Collection Time: 08/11/20 12:41 PM   Specimen: BLOOD RIGHT ARM  Result Value Ref Range Status   Specimen Description BLOOD RIGHT ARM  Final   Special Requests   Final    BOTTLES DRAWN AEROBIC AND ANAEROBIC Blood Culture adequate volume   Culture   Final    NO GROWTH 5 DAYS Performed at [facility], [place] [address] [place], [place] [address]    Report Status 08/16/2020 FINAL  Final  Resp Panel by RT-PCR (Flu Krystina Strieter&B, Covid) Nasopharyngeal Swab     Status: None   Collection Time: 08/11/20  1:10 PM   Specimen: Nasopharyngeal Swab; Nasopharyngeal(NP) swabs  in vial transport medium  Result Value Ref Range Status   SARS Coronavirus 2 by RT PCR NEGATIVE NEGATIVE Final    Comment: (NOTE) SARS-CoV-2 target nucleic acids are NOT DETECTED.  The SARS-CoV-2 RNA is generally detectable in upper respiratory specimens during the acute phase of infection. The lowest concentration of SARS-CoV-2 viral copies this assay can detect is 138 copies/mL. Derrika Ruffalo negative result does not preclude SARS-Cov-2 infection and should not be used as the sole basis for  treatment or other patient management decisions. Devine Klingel negative result may occur with  improper specimen collection/handling, submission of specimen other than nasopharyngeal swab, presence of viral mutation(s) within the areas targeted by this assay, and inadequate number of viral copies(<138 copies/mL). Ramesha Poster negative result must be combined with clinical observations, patient history, and epidemiological information. The expected result is Negative.  Fact Sheet for Patients:  EntrepreneurPulse.com.au  Fact Sheet for Healthcare Providers:  IncredibleEmployment.be  This test is no t yet approved or cleared by the [place] FDA and  has been authorized for detection and/or diagnosis of SARS-CoV-2 by FDA under an Emergency Use Authorization (EUA). This EUA will remain  in effect (meaning this test can be used) for the duration of the COVID-19 declaration under Section 564(b)(1) of the Act, 21 U.S.C.section 360bbb-3(b)(1), unless the authorization is terminated  or revoked sooner.       Influenza Macdonald Rigor by PCR NEGATIVE NEGATIVE Final   Influenza B by PCR NEGATIVE NEGATIVE Final    Comment: (NOTE) The Xpert Xpress SARS-CoV-2/FLU/RSV plus assay is intended as an aid in the diagnosis of influenza from Nasopharyngeal swab specimens and should not be used as Qiara Minetti sole basis for treatment. Nasal washings and aspirates are unacceptable for Xpert Xpress  SARS-CoV-2/FLU/RSV testing.  Fact Sheet for Patients: EntrepreneurPulse.com.au  Fact Sheet for Healthcare Providers: IncredibleEmployment.be  This test is not yet approved or cleared by the [place] FDA and has been authorized for detection and/or diagnosis of SARS-CoV-2 by FDA under an Emergency Use Authorization (EUA). This EUA will remain in effect (meaning this test can be used) for the duration of the COVID-19 declaration under Section 564(b)(1) of the Act, 21 U.S.C. section 360bbb-3(b)(1), unless the authorization is terminated or revoked.  Performed at [facility], [place] [address] [place], [place] [address]   MRSA PCR Screening     Status: None   Collection Time: 08/12/20 12:29 PM   Specimen: Nasal Mucosa; Nasopharyngeal  Result Value Ref Range Status   MRSA by PCR NEGATIVE NEGATIVE Final    Comment:        The GeneXpert MRSA Assay (FDA approved for NASAL specimens only), is one component of Joselynne Killam comprehensive MRSA colonization surveillance program. It is not intended to diagnose MRSA infection nor to guide or monitor treatment for MRSA infections. Performed at [facility], [place] [address], [place], [place] [address]   Urine culture     Status: None   Collection Time: 08/12/20  1:34 PM   Specimen: Urine, Catheterized  Result Value Ref Range Status   Specimen Description   Final    URINE, CATHETERIZED Performed at [place] [address] [place], [place] [address]    Special Requests   Final    NONE Performed at [facility], [place] [address], [place], [place] [address]    Culture   Final    NO GROWTH Performed at [facility], [place] [address] [place], [place] [address]    Report Status 08/13/2020 FINAL  Final  Culture, blood (routine x 2)     Status: None (Preliminary result)   Collection Time: 08/15/20  3:20 PM   Specimen: BLOOD RIGHT HAND   Result Value Ref Range Status   Specimen Description   Final    BLOOD RIGHT HAND Performed at [place] [address], [place],  [address]    Special Requests   Final    BOTTLES DRAWN AEROBIC ONLY  Blood Culture adequate volume Performed at [place] [address], [place], [place] [address]    Culture   Final    NO GROWTH 3 DAYS Performed at [facility], [place] [address], [place], [place] [address]    Report Status PENDING  Incomplete  Culture, blood (routine x 2)     Status: None (Preliminary result)   Collection Time: 08/15/20  3:20 PM   Specimen: BLOOD LEFT HAND  Result Value Ref Range Status   Specimen Description   Final    BLOOD LEFT HAND Performed at [place] [address] [place], [place] [address]    Special Requests   Final    BOTTLES DRAWN AEROBIC ONLY Blood Culture adequate volume Performed at [place] [address] [place], [place] [address]    Culture   Final    NO GROWTH 3 DAYS Performed at [facility], [place] [address] [place], [place] [address]    Report Status PENDING  Incomplete         Radiology Studies: CT ANGIO CHEST PE W OR WO CONTRAST  Result Date: 08/17/2020 CLINICAL DATA:  Chest pain, shortness of breath question pulmonary embolism. History multiple sclerosis, hypertension, smoker EXAM: CT ANGIOGRAPHY CHEST WITH CONTRAST TECHNIQUE: Multidetector CT imaging of the chest was performed using the standard protocol during bolus administration of intravenous contrast. Multiplanar CT image reconstructions and MIPs were obtained to evaluate the vascular anatomy. CONTRAST:  42mL OMNIPAQUE IOHEXOL 350 MG/ML SOLN IV COMPARISON:  05/13/2020 FINDINGS: Cardiovascular: Mild atherosclerotic calcifications at the origins of the great vessels. Aorta normal caliber without aneurysm or dissection. Heart unremarkable. No pericardial effusion. Pulmonary  arteries adequately opacified. No evidence of pulmonary embolism. Mediastinum/Nodes: Esophagus unremarkable. No thoracic adenopathy. Small LEFT thyroid nodules. Heterogeneous large nodule enlarging the RIGHT thyroid lobe, 3.5 x 2.8 cm; recommend thyroid ultrasound (ref: J Am Coll Radiol. 2015 Feb;12(2): 143-50). Lungs/Pleura: Emphysematous changes. Chronic parenchymal scarring in the upper lobes greater on RIGHT. No acute infiltrate, pleural effusion, or pneumothorax. Upper Abdomen: No acute upper abdominal findings. Musculoskeletal: Unremarkable Review of the MIP images confirms the above findings. IMPRESSION: No evidence of pulmonary embolism. No acute intrathoracic abnormalities. Emphysematous changes with BILATERAL upper lobe scarring greater on RIGHT. RIGHT thyroid mass 2.8 x 3.5 cm; recommend thyroid ultrasound assessment. Aortic Atherosclerosis (ICD10-I70.0) and Emphysema (ICD10-J43.9). Electronically Signed   By: Lavonia Dana M.D.   On: 08/17/2020 15:04   VAS [place] LOWER EXTREMITY VENOUS (DVT)  Result Date: 08/18/2020  Lower Venous DVT Study Indications: Fever.  Limitations: Patient immobility. Comparison Study: Previous exam 08/12/20 - negative Performing Technologist: Rogelia Rohrer  Examination Guidelines: Pierce Barocio complete evaluation includes B-mode imaging, spectral Doppler, color Doppler, and power Doppler as needed of all accessible portions of each vessel. Bilateral testing is considered an integral part of Basha Krygier complete examination. Limited examinations for reoccurring indications may be performed as noted. The reflux portion of the exam is performed with the patient in reverse Trendelenburg.  +---------+---------------+---------+-----------+----------+--------------+ RIGHT    CompressibilityPhasicitySpontaneityPropertiesThrombus Aging +---------+---------------+---------+-----------+----------+--------------+ CFV      Full           Yes      Yes                                  +---------+---------------+---------+-----------+----------+--------------+ SFJ      Full                                                        +---------+---------------+---------+-----------+----------+--------------+  FV Prox  Full           Yes      Yes                                 +---------+---------------+---------+-----------+----------+--------------+ FV Mid   Full           Yes      Yes                                 +---------+---------------+---------+-----------+----------+--------------+ FV DistalFull           Yes      Yes                                 +---------+---------------+---------+-----------+----------+--------------+ PFV      Full                                                        +---------+---------------+---------+-----------+----------+--------------+ POP      Full           Yes      Yes                                 +---------+---------------+---------+-----------+----------+--------------+ PTV      Full                                                        +---------+---------------+---------+-----------+----------+--------------+ PERO     Full                                                        +---------+---------------+---------+-----------+----------+--------------+   +---------+---------------+---------+-----------+----------+--------------+ LEFT     CompressibilityPhasicitySpontaneityPropertiesThrombus Aging +---------+---------------+---------+-----------+----------+--------------+ CFV      Full           Yes      Yes                                 +---------+---------------+---------+-----------+----------+--------------+ SFJ      Full                                                        +---------+---------------+---------+-----------+----------+--------------+ FV Prox  Full           Yes      Yes                                  +---------+---------------+---------+-----------+----------+--------------+ FV Mid   Full  Yes      Yes                                 +---------+---------------+---------+-----------+----------+--------------+ FV DistalFull           Yes      Yes                                 +---------+---------------+---------+-----------+----------+--------------+ PFV      Full                                                        +---------+---------------+---------+-----------+----------+--------------+ POP      Full           Yes      Yes                                 +---------+---------------+---------+-----------+----------+--------------+ PTV      Full                                                        +---------+---------------+---------+-----------+----------+--------------+ PERO     Full                                                        +---------+---------------+---------+-----------+----------+--------------+     Summary: BILATERAL: - No evidence of deep vein thrombosis seen in the lower extremities, bilaterally. - No evidence of superficial venous thrombosis in the lower extremities, bilaterally. -No evidence of popliteal cyst, bilaterally.   *See table(s) above for measurements and observations.    Preliminary    VAS [place] UPPER EXTREMITY VENOUS DUPLEX  Result Date: 08/18/2020 UPPER VENOUS STUDY  Indications: Fever Limitations: Patient immobility. Comparison Study: No previous exams Performing Technologist: Rogelia Rohrer  Examination Guidelines: Oluwatosin Bracy complete evaluation includes B-mode imaging, spectral Doppler, color Doppler, and power Doppler as needed of all accessible portions of each vessel. Bilateral testing is considered an integral part of Cayce Quezada complete examination. Limited examinations for reoccurring indications may be performed as noted.  Right Findings: +----------+------------+---------+-----------+----------+-------+ RIGHT      CompressiblePhasicitySpontaneousPropertiesSummary +----------+------------+---------+-----------+----------+-------+ IJV           Full       Yes       Yes                      +----------+------------+---------+-----------+----------+-------+ Subclavian    Full       Yes       Yes                      +----------+------------+---------+-----------+----------+-------+ Axillary      Full       Yes       Yes                      +----------+------------+---------+-----------+----------+-------+  Brachial      Full       Yes       Yes                      +----------+------------+---------+-----------+----------+-------+ Radial        Full                                          +----------+------------+---------+-----------+----------+-------+ Ulnar         Full                                          +----------+------------+---------+-----------+----------+-------+ Cephalic      Full                                          +----------+------------+---------+-----------+----------+-------+ Basilic       Full       Yes       Yes                      +----------+------------+---------+-----------+----------+-------+  Left Findings: +----------+------------+---------+-----------+----------+-------+ LEFT      CompressiblePhasicitySpontaneousPropertiesSummary +----------+------------+---------+-----------+----------+-------+ IJV           Full       Yes       Yes                      +----------+------------+---------+-----------+----------+-------+ Subclavian    Full       Yes       Yes                      +----------+------------+---------+-----------+----------+-------+ Axillary      Full       Yes       Yes                      +----------+------------+---------+-----------+----------+-------+ Brachial      Full       Yes       Yes                      +----------+------------+---------+-----------+----------+-------+  Radial        Full                                          +----------+------------+---------+-----------+----------+-------+ Ulnar         Full                                          +----------+------------+---------+-----------+----------+-------+ Cephalic      Full                                          +----------+------------+---------+-----------+----------+-------+ Basilic       Full       Yes  Yes                      +----------+------------+---------+-----------+----------+-------+  Summary: No evidence of deep vein or superficial vein thrombosis involving the right and left upper extremities.  *See table(s) above for measurements and observations.    Preliminary         Scheduled Meds: . vitamin C  500 mg Oral Daily  . Chlorhexidine Gluconate Cloth  6 each Topical Daily  . dalfampridine  10 mg Oral BID  . feeding supplement  237 mL Oral BID BM  . insulin aspart  0-5 Units Subcutaneous QHS  . insulin aspart  0-9 Units Subcutaneous TID WC  . metoprolol succinate  100 mg Oral Daily  . mometasone-formoterol  2 puff Inhalation BID  . nicotine  21 mg Transdermal Daily  . nutrition supplement (JUVEN)  1 packet Oral BID BM  . nystatin  5 mL Oral QID  . oxybutynin  10 mg Oral Daily  . pantoprazole (PROTONIX) IV  40 mg Intravenous Q12H  . polyethylene glycol  17 g Oral BID  . senna-docusate  1 tablet Oral QHS  . zinc sulfate  220 mg Oral Daily   Continuous Infusions: . meropenem (MERREM) IV 1 g (08/18/20 0928)     LOS: 7 days    Time spent: over 30 min    Fayrene Helper, MD [facility]   To contact the attending provider between 7A-7P or the covering provider during after hours 7P-7A, please log into the web site [email or web address] and access using universal [place] password for that web site. If you do not have the password, please call the hospital operator.  08/18/2020, 2:13 PM

## 2020-08-18 NOTE — CV Procedure (Signed)
BUE & BLE venous duplex exams completed.  Results can be found under chart review under CV PROC. 08/18/2020 12:55 PM Drakkar Medeiros RVT, RDMS

## 2020-08-18 NOTE — Plan of Care (Signed)
  Problem: Coping: Goal: Level of anxiety will decrease Outcome: Progressing   Problem: Pain Managment: Goal: General experience of comfort will improve Outcome: Progressing   Problem: Skin Integrity: Goal: Risk for impaired skin integrity will decrease Outcome: Progressing   Problem: Safety: Goal: Ability to remain free from injury will improve Outcome: Progressing

## 2020-08-18 NOTE — TOC Progression Note (Signed)
Transition of Care [facility]) - Progression Note    Patient Details  Name: Tina Griffith MRN: [id number] Date of Birth: [birth year]  Transition of [facility]) CM/SW Contact  Lynett Brasil, Juliann Pulse, RN Phone Number: 08/18/2020, 12:55 PM  Clinical Narrative:  Left vm w/dtr [name] on bed offers-await choice.Will need auth, & covid within 24hrs of d/c.    1. 0.9 mi [place] [facility] [place], [place] [address] [phone] Overall rating Much above average 2. 1.3 mi [place] at [place], [place] [address] [phone] Overall rating Much below average 3. 2.3 mi [place], [place] [address] [phone] Overall rating Much below average 4. 2.7 mi [place] [address] [place], [place] [address] [phone] Overall rating Below average 5. 3 mi [facility] at [place], [place] [address] [phone] Overall rating Below average 6. 3.1 [place], [place] [address] [phone] Overall rating Below average 7. 3.4 mi [facility] at the [place], [place] [address] [phone] Overall rating Below average 8. 3.4 [place], [place] [address] [phone] Overall rating Above average 9. [place] [address] [place], [place] [address] [phone] Overall rating Much below average 10. 4.1 mi [facility] at [place], [place] [address] [phone] Overall rating Much above average 11. 4.2 [place], [place] [address] [phone] Overall rating Much above average 12. 4.2 mi [facility] [place], [place] [address] [phone] Overall rating Below average 13. [place], [place] [address] [phone] Overall rating Above average 14. 8.2 [place], [place] [address] [phone] Overall rating Above average 15. 8.4 mi The [facility] [address], [place] [address] [phone] Overall rating Average 16. 8.7 mi [facility] [place], [place] [address] [phone] Overall rating Much above average 17. 9.6 mi [facility] and [place]ra [place], [place] [address] [phone] Overall rating Average 18. 10.3 mi [place] at [facility] [address] [place], [place] [address] [phone] Overall rating Much above average 19. 12.1 mi [facility] [address] [place], [place] [address] [phone] Overall rating Much below average 20. 12.2 [facility] [address] [place], [place] [address] [phone] Overall rating Much below average 21. 13.7 mi The [place] [address] [place], [place] [address] [phone] Overall rating Much below average 22. 13.8 mi [facility] [place], [place] [address] [phone] Overall rating Below average 23. 14.4 mi [place], [place] [address] [phone] Overall rating Below average 24. 14.5 [place] [address] [place], [place] [address] [phone] Overall rating Much below average 25. 16.5 mi [place] [address] [place], [place] [address] [phone] Overall rating Above average 26. 17.2 mi [facility] [place], [place] [address] [phone] Overall  rating Much above average 27. 40.9 [facility] [address] [place], [place] [address] [phone] Overall rating Much below average 28. 18.4 mi [place], [place] [address] [phone] Overall rating Average 29. 19.2 mi [facility] and [facility] [address] [place], [place] [address] [phone] Overall rating Much below average 30. 20.5 mi [address] at the [facility] at [facility], [place] [address] [phone] Overall rating Much above average 31. 21.2 mi [address] [place], [place] [address] [phone] Overall rating Below average 32. 21.2 [place], [place] [address] [phone] Overall rating Average 33. 21.4 [address] [place], [place] [address] [phone] Overall rating Much above average 34. [place] [address] [place], [place] [address] [phone] Overall rating Below average 35. 22.2 [place], [place] [address] [phone] Overall rating Much above average 36. 22.4 mi [facility] [place], [place] [address] [phone] Overall rating Much below average 37. 23.2 mi [facility] [address] [place], [place] [address] [phone] Overall rating Below average 38. 23.8 mi [facility] [address] [place], [place] [address] [phone] Overall rating Above average 39. 23.9 [facility] [place], [place] [address] [phone] Overall rating Much below average 40. [place], [place] [address] [phone] Overall rating Much below average 41. [place] and [facility] [address] [place], [place] [address] [phone] Overall rating Average 42. 24.3 [facility] [address] [place], [place] [address] [phone] Overall rating Much below average 43. 24.3 mi [facility] [place], [place] [address] [phone] Overall rating Below average 44. 24.8 mi [place] [address] [place], [place] [address] [phone] Overall rating Above average 45. 24.9 mi The [place] at [place], [place] [address] [phone] Overall rating Much below average To explore and download nursing home data,visit the data catalog on CMS.gov   Expected Discharge Plan: Skilled Nursing Facility Barriers to Discharge: Continued Medical Work up  Expected Discharge Plan and Services Expected Discharge Plan: [place]   Discharge Planning Services: CM Consult   Living arrangements for the past 2 months: [place]                                       Social Determinants of Health (SDOH) Interventions    Readmission Risk Interventions Readmission Risk Prevention Plan 06/18/2020  Transportation Screening Complete  PCP or Specialist Appt within 5-7 Days Complete  Home Care Screening Complete  Medication Review (RN CM) Complete  Some recent data might be hidden

## 2020-08-18 NOTE — Progress Notes (Signed)
[facility]  Date of Admission:  08/11/2020     CC: sepsis, sacral decubitus ulcer infection  Lines: Peripheral iv's  Abx: 3/26-c meropenem  3/24-26 doxy 3/24-26 amp-sulb 3/21-24 cefepime 3/21-24 vanc 3/21-24 metronidazole  ASSESSMENT: Leukocytosis Sacral decub ulcer associated chronic OM Hx covid infection 04/2020 Polypharmacy DM2 Hx MS/CVA with left hemiplegia; hx left optic neuritis Functionally debilitated Hx thyroid goiter Sickle Cell trait  60 yo female chronically debilitated, hx ms/cva left hemiplegia, covid pna 04/2020, recent admission 06/16/20 for sepsis in setting stage IV sacral decub ulcer (s/p I&D 1/27; bcx bacteroides; treated with 2 weeks abx until 2/09 dapto/erta --> doxy/augmentin), readmitted from nursing home 3/21 for family report worsening sacral wound in setting of it often being soiled, meeting sepsis criteria. Of note, does noted to have tarry stools this admission concerning for GIB  #chronic sacral decub -she has had moderate-severe pain since 05/2020 no change -previous admission 05/2020 had I&D but no wound cx sent. Sepsis resolved with that and empiric dapto/erta --> doxy/augmentin. Did have some fever near end of admission in setting of ecoli in urine (non-esbl; R cipro/bactrim, amp-sulb) and had 5 days piptazo -admission here intermittent fever, but stable hemodynamics/moderate leukcoytosis, despite vanc/cefepime/metronidazole --> doxy/amp-sulb, so far appear to have had no fever on meropenem, but too early to tell.  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 After 2 week treatment of this, if she is readmitted again would try to get tissue dx for targetted therapy  #fuo #leukocytosis/thrombocytosis -Noted chronic leukocytosis since  06/16/2020. I have no prior value to compare to -- her last value was 2017 when wbc was normal. She does appear to have a reactive thrombocytosis rising since 05/2020, but has improved since this admission. Has some gib that could account for reactive thrombo/leuckotysosis but gib (tarry stools) rather smoldering. -no blast on exam but does have monocytosis -- chronic infection.  -Has a quantiferon gold 2017 that was indeterminate. Does have bilateral upper lobe scarring and copd changes. She has no respiratory sx otherwise. Will repeat quant-gold, and keep this in the back burner, as at this time don't think she has active tb -noted weight loss but no cough/nightsweat since 05/2020. -I review signs/ of drug-fever -- no rash; lft normal; urine 3/21, 2/03 no leukocytes; cr has been normal stable 0.4-0.5. Other FUO related labs checked -- ferritin 270s on 08/14/20. I do not suspect a rheumatologic process at this time -3/21 bcx negative; repeat 3/25 bcx negative -ct chest/abd/pelv without occult abscess; LE duplex without dvt's -05/2020 tte no obvious vegetation  -at this time no other obvious source (outside of hematologic consideration such as cll/cmL). I would focus on tx of the sacral (would still only plan 2 weeks tx at a time rather than full 6 week course given recurrent nature of sacral OM unaffected by whether or not patient have full OM tx course) -of note, I have noted patients with covid and persistent abnormal marrow reaction with leukocytosis previously, but would remain dx of exclusion  PLAN: 1. Continue meropenem for now 2. Send repeat quantiferon gold 3. Follow crp q72 hours 4. All so far points to the sacral process based on her hx; discussed with nursing staff, besides soilage and serous oozing, no cellulitic change or purulence; she is getting duplex u/s today so  I can't examine (will defer to tomorrow) 5. Consider sending a peripheral flow cytometry if persistent leukocytosis despite  appropriately treated sacral process 6. Other things to consider is culture negative endocarditis; will see if we need to repeat echo  I spent more than 40 minute reviewing data/chart, and coordinating care and >50% direct face to face time providing counseling/discussing diagnostics/treatment plan with patient   Principal Problem:   Sepsis ([place]) Active Problems:   Constipation   Tobacco abuse   Anemia   AMS (altered mental status)   Diabetes ([place])   Fecal impaction (HCC)   Dark stools   Sacral osteomyelitis (HCC)   Scheduled Meds: . vitamin C  500 mg Oral Daily  . Chlorhexidine Gluconate Cloth  6 each Topical Daily  . dalfampridine  10 mg Oral BID  . feeding supplement  237 mL Oral BID BM  . insulin aspart  0-5 Units Subcutaneous QHS  . insulin aspart  0-9 Units Subcutaneous TID WC  . metoprolol succinate  100 mg Oral Daily  . mometasone-formoterol  2 puff Inhalation BID  . nicotine  21 mg Transdermal Daily  . nutrition supplement (JUVEN)  1 packet Oral BID BM  . nystatin  5 mL Oral QID  . oxybutynin  10 mg Oral Daily  . pantoprazole (PROTONIX) IV  40 mg Intravenous Q12H  . polyethylene glycol  17 g Oral BID  . senna-docusate  1 tablet Oral QHS  . sorbitol, milk of mag, mineral oil, glycerin (SMOG) enema  960 mL Rectal BID  . zinc sulfate  220 mg Oral Daily   Continuous Infusions: . meropenem (MERREM) IV 1 g (08/18/20 0928)  . [START ON 08/19/2020] vancomycin    . vancomycin     PRN Meds:.acetaminophen **OR** acetaminophen, oxyCODONE   SUBJECTIVE: Last fever 3/26 Wbc stable moderately elevated 19  Feels the same since admission (since 05/2020) Since 05/2020 buttock/sacral pain, weight losss. No nightsweat. ?on fever as she usually can't feel it  Currently soft stools. No other focal area of discomfort  Chronically bedbound. Stable left hemiplegia  No rash/itch, headache, cough, chest pain, sob, n/v/abd pain, joint pain, myalgia   Review of  Systems: ROS Other ros negative  Allergies  Allergen Reactions  . No Known Allergies     OBJECTIVE: Vitals:   08/17/20 2045 08/18/20 0141 08/18/20 0515 08/18/20 0818  BP: (!) 135/55 119/68 (!) 122/58   Pulse: (!) 40 (!) 126 (!) 127   Resp: 20 19 18    Temp: 100 F (37.8 C) 100.1 F (37.8 C) 98.7 F (37.1 C)   TempSrc: Oral Oral Oral   SpO2: 97% 97% 95% 96%  Weight:      Height:       Body mass index is 26.52 kg/m.  Physical Exam Getting duplex u/s of her ext done No distress; chronically ill appearing; short sentences -- her style, doesn't appear dyspneic; no distress Heent: normocephalic; right sided medial rectus paresis; oropharynx clear; dentition poor Neck supple cv rrr no mrg Lungs clear; normal respiratory effort abd s/nt Ext no edema Skin no rash Neuro: left hemiplegia; diffusely weak 3/5 lower > upper right extremity; right eye medial rectus palsy Psych alert/oriented Msk: bilateral foot in floating boot support  Lab Results Lab Results  Component Value Date   WBC 19.1 (H) 08/18/2020   HGB 8.4 (L) 08/18/2020   HCT 26.2 (L) 08/18/2020   MCV 76.6 (L) 08/18/2020   PLT 707 (H) 08/18/2020    Lab Results  Component  Value Date   CREATININE 0.53 08/18/2020   BUN 20 08/18/2020   NA 139 08/18/2020   K 3.9 08/18/2020   CL 107 08/18/2020   CO2 22 08/18/2020    Lab Results  Component Value Date   ALT 18 08/18/2020   AST 13 (L) 08/18/2020   ALKPHOS 100 08/18/2020   BILITOT 0.5 08/18/2020     Microbiology: Recent Results (from the past 240 hour(s))  Urine Culture     Status: None   Collection Time: 08/11/20 12:33 PM   Specimen: In/Out Cath Urine  Result Value Ref Range Status   Specimen Description IN/OUT CATH URINE  Final   Special Requests NONE  Final   Culture   Final    NO GROWTH Performed at [facility], [place] [address], [place], [place] [address]    Report Status 08/14/2020 FINAL  Final  Blood Culture (routine x 2)     Status: None    Collection Time: 08/11/20 12:40 PM   Specimen: BLOOD LEFT HAND  Result Value Ref Range Status   Specimen Description BLOOD LEFT HAND  Final   Special Requests   Final    BOTTLES DRAWN AEROBIC ONLY Blood Culture results may not be optimal due to an inadequate volume of blood received in culture bottles   Culture   Final    NO GROWTH 5 DAYS Performed at [facility], [place] [address] [place], [place] [address]    Report Status 08/16/2020 FINAL  Final  Blood Culture (routine x 2)     Status: None   Collection Time: 08/11/20 12:41 PM   Specimen: BLOOD RIGHT ARM  Result Value Ref Range Status   Specimen Description BLOOD RIGHT ARM  Final   Special Requests   Final    BOTTLES DRAWN AEROBIC AND ANAEROBIC Blood Culture adequate volume   Culture   Final    NO GROWTH 5 DAYS Performed at [facility], [place] [address], [place], [place] [address]    Report Status 08/16/2020 FINAL  Final  Resp Panel by RT-PCR (Flu A&B, Covid) Nasopharyngeal Swab     Status: None   Collection Time: 08/11/20  1:10 PM   Specimen: Nasopharyngeal Swab; Nasopharyngeal(NP) swabs in vial transport medium  Result Value Ref Range Status   SARS Coronavirus 2 by RT PCR NEGATIVE NEGATIVE Final    Comment: (NOTE) SARS-CoV-2 target nucleic acids are NOT DETECTED.  The SARS-CoV-2 RNA is generally detectable in upper respiratory specimens during the acute phase of infection. The lowest concentration of SARS-CoV-2 viral copies this assay can detect is 138 copies/mL. A negative result does not preclude SARS-Cov-2 infection and should not be used as the sole basis for treatment or other patient management decisions. A negative result may occur with  improper specimen collection/handling, submission of specimen other than nasopharyngeal swab, presence of viral mutation(s) within the areas targeted by this assay, and inadequate number of viral copies(<138 copies/mL). A negative result must be combined with clinical  observations, patient history, and epidemiological information. The expected result is Negative.  Fact Sheet for Patients:  EntrepreneurPulse.com.au  Fact Sheet for Healthcare Providers:  IncredibleEmployment.be  This test is no t yet approved or cleared by the [place] FDA and  has been authorized for detection and/or diagnosis of SARS-CoV-2 by FDA under an Emergency Use Authorization (EUA). This EUA will remain  in effect (meaning this test can be used) for the duration of the COVID-19 declaration under Section 564(b)(1) of  the Act, 21 U.S.C.section 360bbb-3(b)(1), unless the authorization is terminated  or revoked sooner.       Influenza A by PCR NEGATIVE NEGATIVE Final   Influenza B by PCR NEGATIVE NEGATIVE Final    Comment: (NOTE) The Xpert Xpress SARS-CoV-2/FLU/RSV plus assay is intended as an aid in the diagnosis of influenza from Nasopharyngeal swab specimens and should not be used as a sole basis for treatment. Nasal washings and aspirates are unacceptable for Xpert Xpress SARS-CoV-2/FLU/RSV testing.  Fact Sheet for Patients: EntrepreneurPulse.com.au  Fact Sheet for Healthcare Providers: IncredibleEmployment.be  This test is not yet approved or cleared by the [place] FDA and has been authorized for detection and/or diagnosis of SARS-CoV-2 by FDA under an Emergency Use Authorization (EUA). This EUA will remain in effect (meaning this test can be used) for the duration of the COVID-19 declaration under Section 564(b)(1) of the Act, 21 U.S.C. section 360bbb-3(b)(1), unless the authorization is terminated or revoked.  Performed at [facility], [place] [address], [place], [place] [address]   MRSA PCR Screening     Status: None   Collection Time: 08/12/20 12:29 PM   Specimen: Nasal Mucosa; Nasopharyngeal  Result Value Ref Range Status   MRSA by PCR NEGATIVE NEGATIVE Final     Comment:        The GeneXpert MRSA Assay (FDA approved for NASAL specimens only), is one component of a comprehensive MRSA colonization surveillance program. It is not intended to diagnose MRSA infection nor to guide or monitor treatment for MRSA infections. Performed at [facility], [place] [address], [place], [place] [address]   Urine culture     Status: None   Collection Time: 08/12/20  1:34 PM   Specimen: Urine, Catheterized  Result Value Ref Range Status   Specimen Description   Final    URINE, CATHETERIZED Performed at [facility] [address] [place], [place] [address]    Special Requests   Final    NONE Performed at [facility], [place] [address], [place], [place] [address]    Culture   Final    NO GROWTH Performed at [facility], [place] [address] [place], [place] [address]    Report Status 08/13/2020 FINAL  Final  Culture, blood (routine x 2)     Status: None (Preliminary result)   Collection Time: 08/15/20  3:20 PM   Specimen: BLOOD RIGHT HAND  Result Value Ref Range Status   Specimen Description   Final    BLOOD RIGHT HAND Performed at [place] [facility] [place] [address], [place], [place] [address]    Special Requests   Final    BOTTLES DRAWN AEROBIC ONLY Blood Culture adequate volume Performed at [facility] [address], [place], [place] [address]    Culture   Final    NO GROWTH 3 DAYS Performed at [facility], [place] [address] [place], [place] [address]    Report Status PENDING  Incomplete  Culture, blood (routine x 2)     Status: None (Preliminary result)   Collection Time: 08/15/20  3:20 PM   Specimen: BLOOD LEFT HAND  Result Value Ref Range Status   Specimen Description   Final    BLOOD LEFT HAND Performed at [facility] [address] [place], [place] [address]    Special Requests   Final    BOTTLES DRAWN AEROBIC ONLY  Blood Culture adequate volume Performed at  [facility], [place] [address], [place], [place] [address]    Culture   Final    NO GROWTH 3 DAYS Performed at [facility], [place] [address], [place], [place] [address]    Report Status PENDING  Incomplete    Serology:  Imaging: If present, new imagings (plain films, ct scans, and mri) have been personally visualized and interpreted; radiology reports have been reviewed. Decision making incorporated into the Impression / Recommendations.  1/26 tte 1. Abnormal septal motion . Left ventricular ejection fraction, by  estimation, is 60 to 65%. The left ventricle has normal function. The left  ventricle has no regional wall motion abnormalities. There is mild left  ventricular hypertrophy. Left  ventricular diastolic parameters were normal.  2. Right ventricular systolic function is mildly reduced. The right  ventricular size is mildly enlarged. There is mildly elevated pulmonary  artery systolic pressure.  3. The mitral valve is normal in structure. Trivial mitral valve  regurgitation. No evidence of mitral stenosis.  4. The aortic valve is tricuspid. Aortic valve regurgitation is not  visualized. No aortic stenosis is present.  5. The inferior vena cava is normal in size with greater than 50%  respiratory variability, suggesting right atrial pressure of 3 mmHg.   3/21 abd pelv ct with contrast Large sacral decubitus ulceration with cortical irregularity at the inferior coccyx which could be due to chronic osteomyelitis as on prior exam. No loculated fluid collections or evidence of acute osteomyelitis  Large amount of colonic stool with a large fecal ball within the Rectum.  3/22 bilateral LE duplex u/s No dvt's  3/27 chest cta No evidence of pulmonary embolism. No acute intrathoracic abnormalities. Emphysematous changes with BILATERAL upper lobe scarring greater on RIGHT. RIGHT thyroid mass 2.8 x 3.5  cm; recommend thyroid ultrasound assessment.   Jabier Mutton, [facility] for [facility] [place] [phone] pager    08/18/2020, 9:35 AM

## 2020-08-18 NOTE — Progress Notes (Signed)
   08/18/20 2014  Assess: MEWS Score  Temp 97.7 F (36.5 C)  BP (!) 116/42  Pulse Rate (!) 119  Resp 18  SpO2 97 %  Assess: MEWS Score  MEWS Temp 0  MEWS Systolic 0  MEWS Pulse 2  MEWS RR 0  MEWS LOC 0  MEWS Score 2  MEWS Score Color Yellow  Assess: if the MEWS score is Yellow or Red  Were vital signs taken at a resting state? Yes  Focused Assessment No change from prior assessment  Early Detection of Sepsis Score *See Row Information* Low  MEWS guidelines implemented *See Row Information* No, previously yellow, continue vital signs every 4 hours

## 2020-08-18 NOTE — Care Management Important Message (Signed)
Important Message  Patient Details IM Letter given to the Patient. Name: Tina Griffith MRN: [id number] Date of Birth: [birth year]   Medicare Important Message Given:  Yes     Kerin Salen 08/18/2020, 12:46 PM

## 2020-08-18 NOTE — Progress Notes (Signed)
Pharmacy Antibiotic Note  Tina Griffith is a 60 y.o. female admitted on 08/11/2020 with recurrent osteo.  Pharmacy has been consulted for meropenem dosing after fever with unasyn/doxy and per ID instructions, would change to meropenem if develops fever.  Adding vancomycin 3/28.  Plan: Continue Meropenem 1g IV q8 per current renal function Vancomycin 1500mg  IV x 1, then 1g IV q12h, estimated AUC 552, Cmin 16.2, using SCr rounded up to 0.8 Plan to check vancomycin levels at steady state if continues Follow up renal function & cultures  Height: 5\' 7"  (170.2 cm) Weight: 76.8 kg (169 lb 5 oz) IBW/kg (Calculated) : 61.6  Temp (24hrs), Avg:99.6 F (37.6 C), Min:97.7 F (36.5 C), Max:101 F (38.3 C)  Recent Labs  Lab 08/11/20 1314 08/12/20 0344 08/13/20 0050 08/14/20 0102 08/15/20 0755 08/15/20 2356 08/16/20 1304 08/17/20 0449 08/18/20 0447  WBC  --    < > 19.2* 19.4* 20.8*  --   --  18.6* 19.1*  CREATININE  --    < > 0.63 0.40* 0.41* <0.30* 0.43* 0.41* 0.53  LATICACIDVEN 1.5  --   --   --   --   --   --   --   --    < > = values in this interval not displayed.    Estimated Creatinine Clearance: 80.9 mL/min (by C-G formula based on SCr of 0.53 mg/dL).    Allergies  Allergen Reactions  . No Known Allergies    3/21 Vanc/cefepime/flagyl >> 3/24 3/24 Unasyn >> 3/26 3/24 doxy >> 3/26 3/26 meropenem >>  3/28 vancomycin >>  3/21 Bcx: ngtd 3/21 Ucx: ngf 3/22 UCx: NGF 3/25 BCx: ngtd  Thank you for allowing pharmacy to be a part of this patient's care.  Peggyann Juba, PharmD, BCPS Pharmacy: [phone] 08/18/2020 7:29 AM

## 2020-08-19 DIAGNOSIS — M4628 Osteomyelitis of vertebra, sacral and sacrococcygeal region: Secondary | ICD-10-CM | POA: Diagnosis not present

## 2020-08-19 DIAGNOSIS — D72829 Elevated white blood cell count, unspecified: Secondary | ICD-10-CM | POA: Diagnosis not present

## 2020-08-19 DIAGNOSIS — A419 Sepsis, unspecified organism: Secondary | ICD-10-CM | POA: Diagnosis not present

## 2020-08-19 LAB — GLUCOSE, CAPILLARY
Glucose-Capillary: 83 mg/dL (ref 70–99)
Glucose-Capillary: 103 mg/dL — ABNORMAL HIGH (ref 70–99)
Glucose-Capillary: 102 mg/dL — ABNORMAL HIGH (ref 70–99)
Glucose-Capillary: 109 mg/dL — ABNORMAL HIGH (ref 70–99)

## 2020-08-19 LAB — CBC WITH DIFFERENTIAL/PLATELET
Abs Immature Granulocytes: 0.25 10*3/uL — ABNORMAL HIGH (ref 0.00–0.07)
Basophils Absolute: 0.1 10*3/uL (ref 0.0–0.1)
Basophils Relative: 0 %
Eosinophils Absolute: 0.1 10*3/uL (ref 0.0–0.5)
Eosinophils Relative: 0 %
HCT: 24.6 % — ABNORMAL LOW (ref 36.0–46.0)
Hemoglobin: 8 g/dL — ABNORMAL LOW (ref 12.0–15.0)
Immature Granulocytes: 1 %
Lymphocytes Relative: 11 %
Lymphs Abs: 2 10*3/uL (ref 0.7–4.0)
MCH: 24.8 pg — ABNORMAL LOW (ref 26.0–34.0)
MCHC: 32.5 g/dL (ref 30.0–36.0)
MCV: 76.4 fL — ABNORMAL LOW (ref 80.0–100.0)
Monocytes Absolute: 1.3 10*3/uL — ABNORMAL HIGH (ref 0.1–1.0)
Monocytes Relative: 8 %
Neutro Abs: 13.8 10*3/uL — ABNORMAL HIGH (ref 1.7–7.7)
Neutrophils Relative %: 80 %
Platelets: 663 10*3/uL — ABNORMAL HIGH (ref 150–400)
RBC: 3.22 MIL/uL — ABNORMAL LOW (ref 3.87–5.11)
RDW: 19.5 % — ABNORMAL HIGH (ref 11.5–15.5)
WBC: 17.5 10*3/uL — ABNORMAL HIGH (ref 4.0–10.5)
nRBC: 0.3 % — ABNORMAL HIGH (ref 0.0–0.2)

## 2020-08-19 LAB — COMPREHENSIVE METABOLIC PANEL
ALT: 16 U/L (ref 0–44)
AST: 11 U/L — ABNORMAL LOW (ref 15–41)
Albumin: 1.9 g/dL — ABNORMAL LOW (ref 3.5–5.0)
Alkaline Phosphatase: 97 U/L (ref 38–126)
Anion gap: 7 (ref 5–15)
BUN: 14 mg/dL (ref 6–20)
CO2: 23 mmol/L (ref 22–32)
Calcium: 8.7 mg/dL — ABNORMAL LOW (ref 8.9–10.3)
Chloride: 106 mmol/L (ref 98–111)
Creatinine, Ser: 0.35 mg/dL — ABNORMAL LOW (ref 0.44–1.00)
GFR, Estimated: >60 mL/min (ref >60)
Glucose, Bld: 96 mg/dL (ref 70–99)
Potassium: 3.7 mmol/L (ref 3.5–5.1)
Sodium: 136 mmol/L (ref 135–145)
Total Bilirubin: 0.6 mg/dL (ref 0.3–1.2)
Total Protein: 5 g/dL — ABNORMAL LOW (ref 6.5–8.1)

## 2020-08-19 LAB — PATHOLOGIST SMEAR REVIEW

## 2020-08-19 LAB — PHOSPHORUS: Phosphorus: 2.4 mg/dL — ABNORMAL LOW (ref 2.5–4.6)

## 2020-08-19 LAB — PROCALCITONIN: Procalcitonin: 0.64 ng/mL

## 2020-08-19 LAB — MAGNESIUM: Magnesium: 1.7 mg/dL (ref 1.7–2.4)

## 2020-08-19 LAB — C-REACTIVE PROTEIN: CRP: 9 mg/dL — ABNORMAL HIGH (ref <1.0)

## 2020-08-19 NOTE — Progress Notes (Signed)
Tina Griffith for Infectious Disease  Date of Admission:  08/11/2020     CC: sepsis, sacral decubitus ulcer infection  Lines: Peripheral iv's  Abx: 3/26-c meropenem  3/24-26 doxy 3/24-26 amp-sulb 3/21-24 cefepime 3/21-24 vanc 3/21-24 metronidazole  ASSESSMENT: Leukocytosis Sacral decub ulcer associated chronic OM Hx covid infection 04/2020 Polypharmacy DM2 Hx MS/CVA with left hemiplegia; hx left optic neuritis Functionally debilitated Hx thyroid goiter Sickle Cell trait  60 yo female chronically debilitated, hx ms/cva left hemiplegia, covid pna 04/2020, recent admission 06/16/20 for sepsis in setting stage IV sacral decub ulcer (s/p I&D 1/27; bcx bacteroides; treated with 2 weeks abx until 2/09 dapto/erta --> doxy/augmentin), readmitted from nursing home 3/21 for family report worsening sacral wound in setting of it often being soiled, meeting sepsis criteria. Of note, does noted to have tarry stools this admission concerning for GIB  #chronic sacral decub -she has had moderate-severe pain since 05/2020 no change -previous admission 05/2020 had I&D but no wound cx sent. Sepsis resolved with that and empiric dapto/erta --> doxy/augmentin. Did have some fever near end of admission in setting of ecoli in urine (non-esbl; R cipro/bactrim, amp-sulb) and had 5 days piptazo -admission here intermittent fever, but stable hemodynamics/moderate leukcoytosis, despite vanc/cefepime/metronidazole --> doxy/amp-sulb, so far appear to have had no fever on meropenem, but too early to tell.  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 After 2 week treatment of this, if she is readmitted again would try to get tissue dx for targetted therapy  #fuo #leukocytosis/thrombocytosis -Noted chronic leukocytosis since  06/16/2020. I have no prior value to compare to -- her last value was 2017 when wbc was normal. She does appear to have a reactive thrombocytosis rising since 05/2020, but has improved since this admission. Has some gib that could account for reactive thrombo/leuckotysosis but gib (tarry stools) rather smoldering. -no blast on exam but does have monocytosis -- chronic infection.  -Has a quantiferon gold 2017 that was indeterminate. Does have bilateral upper lobe scarring and copd changes. She has no respiratory sx otherwise. Although low suspicion for pulm tb, will keep this in backburner. Quant gold is pending repeat (if positive, will treat for ltbi once active tb r/o'ed). She was never told she had TB exposure or had treatment in the past -noted weight loss but no cough/nightsweat since 05/2020. -I review signs/ of drug-fever -- no rash; lft normal; urine 3/21, 2/03 no leukocytes; cr has been normal stable 0.4-0.5. Other FUO related labs checked -- ferritin 270s on 08/14/20. I do not suspect a rheumatologic process at this time -3/21 bcx negative; repeat 3/25 bcx negative -ct chest/abd/pelv without occult abscess; repeat duplex upper and LE on 3/28 without dvt's -05/2020 tte no obvious vegetation  -despite improving cbc profile/inflammatory markers, she continues to have fever. We haven't looked at hematologic related issues but that don't appear consistent with what's going on. I will go ahead and get a tte. Perhaps this will end up with a pet scan if ongoing fever despite everything else looking better -- this will evaluate potentially for the bilateral upper lobes scarring seen on ct as well   PLAN: 1. Continue meropenem for now 2. F/u quant-gold 3. Order limited echo 4. Follow crp q72 hours 5. Send a pathologist blood smear review 6. Will see in a couple days if ongoing  fever and cbc continues to improve, if we need a pet scan  I spent more than 25 minute reviewing data/chart, and coordinating  care and >50% direct face to face time providing counseling/discussing diagnostics/treatment plan with patient   Principal Problem:   Sepsis ([place]) Active Problems:   Constipation   Tobacco abuse   Anemia   AMS (altered mental status)   Diabetes ([place])   Fecal impaction (HCC)   Dark stools   Sacral osteomyelitis (HCC)   Scheduled Meds: . vitamin C  500 mg Oral Daily  . Chlorhexidine Gluconate Cloth  6 each Topical Daily  . dalfampridine  10 mg Oral BID  . feeding supplement  237 mL Oral BID BM  . insulin aspart  0-5 Units Subcutaneous QHS  . insulin aspart  0-9 Units Subcutaneous TID WC  . metoprolol succinate  100 mg Oral Daily  . mometasone-formoterol  2 puff Inhalation BID  . nicotine  21 mg Transdermal Daily  . nutrition supplement (JUVEN)  1 packet Oral BID BM  . nystatin  5 mL Oral QID  . oxybutynin  10 mg Oral Daily  . pantoprazole (PROTONIX) IV  40 mg Intravenous Q12H  . polyethylene glycol  17 g Oral BID  . senna-docusate  1 tablet Oral QHS  . zinc sulfate  220 mg Oral Daily   Continuous Infusions: . meropenem (MERREM) IV 1 g (08/19/20 0915)   PRN Meds:.acetaminophen **OR** acetaminophen, oxyCODONE   SUBJECTIVE: Febrile 101s again 3/28 yesterday Wbc and platelet improving along with crp and procalcitonin No Tina sx outside of chronic sacral area pain No rash/itch, headache, cough, chest pain, sob, n/v/abd pain, joint pain, myalgia   Review of Systems: ROS Other ros negative  Allergies  Allergen Reactions  . No Known Allergies     OBJECTIVE: Vitals:   08/19/20 0036 08/19/20 0513 08/19/20 0857 08/19/20 0952  BP: (!) 137/47 (!) 112/44  131/71  Pulse: (!) 136 (!) 116  (!) 120  Resp: 20 20  18   Temp: (!) 101.4 F (38.6 C) (!) 97.5 F (36.4 C)  98.4 F (36.9 C)  TempSrc: Oral Oral  Oral  SpO2: 99% 94% 97% 96%  Weight:      Height:       Body mass index is 26.52 kg/m.  Physical Exam  No distress; chronically ill appearing, conversant Heent:  normocephalic; right sided medial rectus paresis; oropharynx clear; dentition poor; tongue clear no plaque/rash/ulcer Neck supple cv rrr no mrg Lungs clear; normal respiratory effort abd s/nt Ext no edema Skin no rash Neuro: left hemiplegia; diffusely weak 3/5 lower > upper right extremity; right eye medial rectus palsy Psych alert/oriented Msk: bilateral foot in floating boot support  Lab Results Lab Results  Component Value Date   WBC 17.5 (H) 08/19/2020   HGB 8.0 (L) 08/19/2020   HCT 24.6 (L) 08/19/2020   MCV 76.4 (L) 08/19/2020   PLT 663 (H) 08/19/2020    Lab Results  Component Value Date   CREATININE 0.35 (L) 08/19/2020   BUN 14 08/19/2020   NA 136 08/19/2020   K 3.7 08/19/2020   CL 106 08/19/2020   CO2 23 08/19/2020    Lab Results  Component Value Date   ALT 16 08/19/2020   AST 11 (L) 08/19/2020   ALKPHOS 97 08/19/2020   BILITOT 0.6 08/19/2020     Microbiology: Recent Results (from the past 240 hour(s))  Urine Culture     Status: None   Collection Time: 08/11/20 12:33 PM  Specimen: In/Out Cath Urine  Result Value Ref Range Status   Specimen Description IN/OUT CATH URINE  Final   Special Requests NONE  Final   Culture   Final    NO GROWTH Performed at [facility], [address] [place], [place] [address]    Report Status 08/14/2020 FINAL  Final  Blood Culture (routine x 2)     Status: None   Collection Time: 08/11/20 12:40 PM   Specimen: BLOOD LEFT HAND  Result Value Ref Range Status   Specimen Description BLOOD LEFT HAND  Final   Special Requests   Final    BOTTLES DRAWN AEROBIC ONLY Blood Culture results may not be optimal due to an inadequate volume of blood received in culture bottles   Culture   Final    NO GROWTH 5 DAYS Performed at [facility], [place] [address] [place], [place] [address]    Report Status 08/16/2020 FINAL  Final  Blood Culture (routine x 2)     Status: None   Collection Time: 08/11/20 12:41 PM   Specimen:  BLOOD RIGHT ARM  Result Value Ref Range Status   Specimen Description BLOOD RIGHT ARM  Final   Special Requests   Final    BOTTLES DRAWN AEROBIC AND ANAEROBIC Blood Culture adequate volume   Culture   Final    NO GROWTH 5 DAYS Performed at [facility], [place] [address], [place], [place] [address]    Report Status 08/16/2020 FINAL  Final  Resp Panel by RT-PCR (Flu A&B, Covid) Nasopharyngeal Swab     Status: None   Collection Time: 08/11/20  1:10 PM   Specimen: Nasopharyngeal Swab; Nasopharyngeal(NP) swabs in vial transport medium  Result Value Ref Range Status   SARS Coronavirus 2 by RT PCR NEGATIVE NEGATIVE Final    Comment: (NOTE) SARS-CoV-2 target nucleic acids are NOT DETECTED.  The SARS-CoV-2 RNA is generally detectable in upper respiratory specimens during the acute phase of infection. The lowest concentration of SARS-CoV-2 viral copies this assay can detect is 138 copies/mL. A negative result does not preclude SARS-Cov-2 infection and should not be used as the sole basis for treatment or other patient management decisions. A negative result may occur with  improper specimen collection/handling, submission of specimen other than nasopharyngeal swab, presence of viral mutation(s) within the areas targeted by this assay, and inadequate number of viral copies(<138 copies/mL). A negative result must be combined with clinical observations, patient history, and epidemiological information. The expected result is Negative.  Fact Sheet for Patients:  EntrepreneurPulse.com.au  Fact Sheet for Healthcare Providers:  IncredibleEmployment.be  This test is no t yet approved or cleared by the [place] FDA and  has been authorized for detection and/or diagnosis of SARS-CoV-2 by FDA under an Emergency Use Authorization (EUA). This EUA will remain  in effect (meaning this test can be used) for the duration of the COVID-19 declaration under  Section 564(b)(1) of the Act, 21 U.S.C.section 360bbb-3(b)(1), unless the authorization is terminated  or revoked sooner.       Influenza A by PCR NEGATIVE NEGATIVE Final   Influenza B by PCR NEGATIVE NEGATIVE Final    Comment: (NOTE) The Xpert Xpress SARS-CoV-2/FLU/RSV plus assay is intended as an aid in the diagnosis of influenza from Nasopharyngeal swab specimens and should not be used as a sole basis for treatment. Nasal washings and aspirates are unacceptable for Xpert Xpress SARS-CoV-2/FLU/RSV testing.  Fact Sheet for Patients: EntrepreneurPulse.com.au  Fact Sheet  for Healthcare Providers: IncredibleEmployment.be  This test is not yet approved or cleared by the [place] and has been authorized for detection and/or diagnosis of SARS-CoV-2 by FDA under an Emergency Use Authorization (EUA). This EUA will remain in effect (meaning this test can be used) for the duration of the COVID-19 declaration under Section 564(b)(1) of the Act, 21 U.S.C. section 360bbb-3(b)(1), unless the authorization is terminated or revoked.  Performed at [facility], [place] [address] [place], [place] [address]   MRSA PCR Screening     Status: None   Collection Time: 08/12/20 12:29 PM   Specimen: Nasal Mucosa; Nasopharyngeal  Result Value Ref Range Status   MRSA by PCR NEGATIVE NEGATIVE Final    Comment:        The GeneXpert MRSA Assay (FDA approved for NASAL specimens only), is one component of a comprehensive MRSA colonization surveillance program. It is not intended to diagnose MRSA infection nor to guide or monitor treatment for MRSA infections. Performed at [facility], [place] [address] [place], [place] [address]   Urine culture     Status: None   Collection Time: 08/12/20  1:34 PM   Specimen: Urine, Catheterized  Result Value Ref Range Status   Specimen Description   Final    URINE, CATHETERIZED Performed at  [place] [address], [place], [place] [address]    Special Requests   Final    NONE Performed at [facility], [place] [address] [place], [place] [address]    Culture   Final    NO GROWTH Performed at [facility], [place] [address] [place], [place] [address]    Report Status 08/13/2020 FINAL  Final  Culture, blood (routine x 2)     Status: None (Preliminary result)   Collection Time: 08/15/20  3:20 PM   Specimen: BLOOD RIGHT HAND  Result Value Ref Range Status   Specimen Description   Final    BLOOD RIGHT HAND Performed at [place] [address], [place], [place] [address]    Special Requests   Final    BOTTLES DRAWN AEROBIC ONLY Blood Culture adequate volume Performed at [place] [address], [place], [place] [address]    Culture   Final    NO GROWTH 4 DAYS Performed at [facility], [place] [address], [place], [place] [address]    Report Status PENDING  Incomplete  Culture, blood (routine x 2)     Status: None (Preliminary result)   Collection Time: 08/15/20  3:20 PM   Specimen: BLOOD LEFT HAND  Result Value Ref Range Status   Specimen Description   Final    BLOOD LEFT HAND Performed at [place] [address] [place], [place] [address]    Special Requests   Final    BOTTLES DRAWN AEROBIC ONLY Blood Culture adequate volume Performed at [facility] [address], [place], [place] [address]    Culture   Final    NO GROWTH 4 DAYS Performed at [facility], [place] [address], [place],  [address]    Report Status PENDING  Incomplete    Serology:  Imaging: If present, Tina imagings (plain films, ct scans, and mri) have been personally visualized and interpreted; radiology reports have been reviewed. Decision making incorporated into the Impression / Recommendations.  1/26 tte 1. Abnormal septal motion . Left  ventricular ejection  fraction, by  estimation, is 60 to 65%. The left ventricle has normal function. The left  ventricle has no regional wall motion abnormalities. There is mild left  ventricular hypertrophy. Left  ventricular diastolic parameters were normal.  2. Right ventricular systolic function is mildly reduced. The right  ventricular size is mildly enlarged. There is mildly elevated pulmonary  artery systolic pressure.  3. The mitral valve is normal in structure. Trivial mitral valve  regurgitation. No evidence of mitral stenosis.  4. The aortic valve is tricuspid. Aortic valve regurgitation is not  visualized. No aortic stenosis is present.  5. The inferior vena cava is normal in size with greater than 50%  respiratory variability, suggesting right atrial pressure of 3 mmHg.   3/21 abd pelv ct with contrast Large sacral decubitus ulceration with cortical irregularity at the inferior coccyx which could be due to chronic osteomyelitis as on prior exam. No loculated fluid collections or evidence of acute osteomyelitis  Large amount of colonic stool with a large fecal ball within the Rectum.  3/22 bilateral LE duplex u/s No dvt's  3/27 chest cta No evidence of pulmonary embolism. No acute intrathoracic abnormalities. Emphysematous changes with BILATERAL upper lobe scarring greater on RIGHT. RIGHT thyroid mass 2.8 x 3.5 cm; recommend thyroid ultrasound assessment.  3/28 duplex ext no dvt's  Jabier Mutton, [place] for Infectious [place] [phone] pager    08/19/2020, 11:17 AM

## 2020-08-19 NOTE — Progress Notes (Signed)
Physical Therapy Treatment Patient Details Name: Tina Griffith MRN: [id number] DOB: 06-25-1960 Today's Date: 08/19/2020    History of Present Illness Patient is 60 y.o. female with PMH significant for MS, Lt hemiplegia, HTN, and diagnosed with COVID-19 pneumonia in December 2021.  At baseline, she was using a walker and wheelchair PTA for COVID.  Since then she had remained bedbound at home and developed sacral decubitus ulcers. Patient admitted later January for sepsis secondary to infected sacral decubitus ulcer and underwent surgical debridement on 1/27.  Pt then ultimately was discharged to [facility] 2/14, and returns to ED 3/21 from SNF due to family report of fever, malodorous discharge from the wound, AMS, and neglect from facility on basis of family's feeling that the facility is neglecting her.  Pt admitted with Sepsis due to infected sacral decubitus ulcer with chronic osteomyelitis    PT Comments    Patient  Requires total care at this time, patient assisted   To roll to be cleaned from BM. Patient with increased pain with any movements. Patient is not able to hold cup to drink. Does move RUE >.Patient is not able to assist with rolling. Noted spasms of LE's. PT goals are maximized as patient has been total care PTA and comes from SNF, plans to return to SNF.  PT will sign off.  Air replacement mattress delivered( on back order since last week).   Pt will sign off at this time.  Follow Up Recommendations   (SNF  for LTC, skilled PT  is not indicated)     Equipment Recommendations  None recommended by PT    Recommendations for Other Services       Precautions / Restrictions Precautions Precautions: Fall Precaution Comments: sacral wound, painful to move  extremities.    Mobility  Bed Mobility Overal bed mobility: Needs Assistance Bed Mobility: Rolling Rolling: Total assist;+2 for physical assistance;+2 for safety/equipment         General bed mobility comments: patient   requires total assistance for rolling.    Transfers                 General transfer comment: hoyer lift to w/c per chart review  Ambulation/Gait                 Stairs             Wheelchair Mobility    Modified Rankin (Stroke Patients Only)       Balance                                            Cognition   Behavior During Therapy: Flat affect Overall Cognitive Status: History of cognitive impairments - at baseline                                 General Comments: appears to be poor historian, follows directions, makes personal needs known, not aware of BM      Exercises      General Comments        Pertinent Vitals/Pain Faces Pain Scale: Hurts whole lot Pain Location: tender to touch legs, move all extrmities and roll Pain Descriptors / Indicators: Grimacing;Moaning Pain Intervention(s): Monitored during session;RN gave pain meds during session    Home Living  Prior Function            PT Goals (current goals can now be found in the care plan section) Progress towards PT goals: Progressing toward goals    Frequency    Min 1X/week      PT Plan Current plan remains appropriate    Co-evaluation              AM-PAC PT "6 Clicks" Mobility   Outcome Measure  Help needed turning from your back to your side while in a flat bed without using bedrails?: Total Help needed moving from lying on your back to sitting on the side of a flat bed without using bedrails?: Total Help needed moving to and from a bed to a chair (including a wheelchair)?: Total Help needed standing up from a chair using your arms (e.g., wheelchair or bedside chair)?: Total Help needed to walk in hospital room?: Total Help needed climbing 3-5 steps with a railing? : Total 6 Click Score: 6    End of Session   Activity Tolerance: Patient limited by pain Patient left: in bed;with call  bell/phone within reach;with bed alarm set;with nursing/sitter in room Nurse Communication: Mobility status;Need for lift equipment PT Visit Diagnosis: Muscle weakness (generalized) (M62.81);Pain     Time: 3662-9476 PT Time Calculation (min) (ACUTE ONLY): 23 min  Charges:  $Therapeutic Activity: 23-37 mins                    Tresa Endo PT Acute Rehabilitation Services Pager [phone] Office [phone]   Claretha Cooper 08/19/2020, 3:38 PM

## 2020-08-19 NOTE — Progress Notes (Signed)
Patient in yellow mews at start of shift 

## 2020-08-19 NOTE — Progress Notes (Signed)
PROGRESS NOTE    Tina Griffith  AST:[id number] DOB: [birth year] DOA: 08/11/2020 PCP: [facility]   Chief Complaint  Patient presents with  . sacral wound    Brief Narrative:  Tina Griffith is Tina Griffith 60 y.o. female with [name] history of MS, NIDT2DM, HTN, IBS-C who suffered covid-19 pneumonia in December 2021 with resultant bedbound status and developed sacral decubitus ulcer which became infected necessitating admission 06/16/2020 and debridement 1/27. Ultimately was discharged to [facility] 2/14, and [facility] from SNF due to family report of fever, malodorous discharge from the wound, AMS, and neglect from facility. on basis of family's feeling that the facility is neglecting her. In the ED she was febrile, tachycardic, tachypneic with leukocytosis, thrombocytosis. She had Winda Summerall large sacral decubitus ulcer (pictured in H&P) seen on exam with CT abd/pelvis revealing changes consistent with chronic osteomyelitis  without abscess or acute osteomyelitis. UA not consistent with infection and CXR without lobar infiltrate. Vancomycin, cefepime, and flagyl were given after blood cultures were drawn and she was admitted to [facility] based on bed availability with ID consulted.    She's continued to have recurrent fevers.  ID is following and continuing to work up her recurrent fevers.  Hospitalization was c/b dark stools concerning for melena and there was plan for scope with GI, but she refused after getting downstairs, maybe more willing at this time, though awaiting improvement in recurrent fevers/tachycardia given improvement and stability in Hb.    See below for additional details    Assessment & Plan:   Principal Problem:   Sepsis ([place]) Active Problems:   Constipation   Tobacco abuse   Anemia   AMS (altered mental status)   Diabetes ([place])   Fecal impaction ([facility])   Dark stools   Sacral osteomyelitis ([place])  Sepsis due to infected sacral decubitus ulcer with chronic osteomyelitis:  -  Continue broad antimicrobial therapy pending ID consult and culture results - appreciate ID recommendations - recommending doxycyclin + unasyn -> if recurrent fever, recommended meropenem - continuing meropenem per ID - wound c/s - surgery planning to follow - not planning for diversion at this point  - Pain control as ordered - blood cx 3/21 NGTD - blood cx 3/25 pending - urine cx 3/22 with no growth  - Sed rate 65 from 92 on 06/21/20 - CRP 15.4 from 13.9 on 06/21/20 - CXR 3/25 with scarring to RUL, lungs clear elsewhere - CT abd 3/21 with large sacral decub with cortical irregularity which could be due to chronic osteo  - recurrent fever 3/27 am - follow CT chest below  Recurrent Fevers - suspect 2/2 above - appreciate ID recs given fevers despite abx  - dopplers to r/o DVT - ID c/s, appreciate recs --- recommending repeat quant gold, continue meropenem, follow echo, crp q72, follow echo.  Consider PET scan if ongoing fever.  See 3/29 note.  Tachycardia  Chest Discomfort  SOB EKG appears similar to priors, sinus tach, RBBB, LAFB Follow troponin (negative) and repeat EKG - stable Follow CT PE protocol - negative for PE or acute intrathoracic abnormalities Likely related to fever, but ctm, w/u additionally prn - follow with change in IV abx above  Melena  Concern for Acute Blood Loss Anemia  Iron Def Anemia Low normal B12, follow MMA Serial Hb - relatively stable -fluctuating, will continue to follow daily Suggestive of iron def with aocd Of note, brown stool noted on 3/29 during dressing change IV PPI BID Plan for endoscopy  3/24 AM with GI - she declined this - I think she's agreeable now, will discuss again with GI regarding timing of this -> though need to address her tachycardia/recurrent fevers first.  Will need to discuss with GI again once things are [name] bit more stable from fever/tachy standpoint.  Constipation with fecal ball on CT:  - appreciate GI assistance - bowel  regimen - s/p enemas, disimpaction  Multiple sclerosis:  Resume dalfampridine  RLE edema:  -negative [place] for DVT  HTN: Has been on metoprolol confirmed by last discharge summary. This will be reordered with tachycardia and hypertension  NIDT2DM: HbA1c 6.5% indicating good chronic control.  - SSI  Tobacco use: 1ppd.  - Cessation counseling, nicotine patch.  Concern for neglect at [facility].  - CSW consulted.   Acute Metabolic Encephalopathy A35, folate wnl Follow TSH (wnl) Likely 2/2 acute illness and MS Delirium precautions  Thyroid Nodules [place] with 2 nodules, 1 meeting criteria for FNA, the other needing [place] in 1 year  IBS, GERD:  - Was on PPI previously.   RN Pressure Injury Documentation: Pressure Injury 06/17/20 Buttocks Right;Anterior Stage 4 - Full thickness tissue loss with exposed bone, tendon or muscle. Deep Oval shaped pressure wound (6cmx4cmx3cm) muscle and bone exposed, areas of necrotic tissue at the edges, red & yellow inside w (Active)  06/17/20 2200  Location: Buttocks  Location Orientation: Right;Anterior (extending towards the sacrum)  Staging: Stage 4 - Full thickness tissue loss with exposed bone, tendon or muscle.  Wound Description (Comments): Deep Oval shaped pressure wound (6cmx4cmx3cm) muscle and bone exposed, areas of necrotic tissue at the edges, red & yellow inside w/active bleeding & tunneling, foul smelling  Present on Admission: Yes   DVT prophylaxis: SCD Code Status:full  Family Communication: none at bedside - sister over phone 3/26 Disposition:   Status is: Inpatient  Remains inpatient appropriate because:Inpatient level of care appropriate due to severity of illness   Dispo: The patient is from: Home              Anticipated d/c is to: pending              Patient currently is not medically stable to d/c.   Difficult to place patient No   Consultants:   Surgery GI  ID   Procedures:   LE [place] Summary:  RIGHT:  - There  is no evidence of deep vein thrombosis in the lower extremity.    - No cystic structure found in the popliteal fossa.    LEFT:  - No evidence of common femoral vein obstruction.      Antimicrobials:  Anti-infectives (From admission, onward)   Start     Dose/Rate Route Frequency Ordered Stop   08/19/20 0600  vancomycin (VANCOREADY) IVPB 1000 mg/200 mL  Status:  Discontinued        1,000 mg 200 mL/hr over 60 Minutes Intravenous Every 12 hours 08/18/20 0733 08/18/20 1244   08/18/20 1000  vancomycin (VANCOREADY) IVPB 1500 mg/300 mL  Status:  Discontinued        1,500 mg 150 mL/hr over 120 Minutes Intravenous  Once 08/18/20 0733 08/18/20 1243   08/16/20 1800  meropenem (MERREM) 1 g in sodium chloride 0.9 % 100 mL IVPB        1 g 200 mL/hr over 30 Minutes Intravenous Every 8 hours 08/16/20 1524     08/14/20 2200  Ampicillin-Sulbactam (UNASYN) 3 g in sodium chloride 0.9 % 100 mL IVPB  Status:  Discontinued  3 g 200 mL/hr over 30 Minutes Intravenous Every 6 hours 08/14/20 1603 08/16/20 1512   08/14/20 2200  doxycycline (VIBRA-TABS) tablet 100 mg  Status:  Discontinued        100 mg Oral Every 12 hours 08/14/20 1603 08/16/20 1521   08/12/20 1400  vancomycin (VANCOREADY) IVPB 1250 mg/250 mL  Status:  Discontinued        1,250 mg 166.7 mL/hr over 90 Minutes Intravenous Every 24 hours 08/11/20 1429 08/14/20 1603   08/11/20 2200  ceFEPIme (MAXIPIME) 2 g in sodium chloride 0.9 % 100 mL IVPB  Status:  Discontinued        2 g 200 mL/hr over 30 Minutes Intravenous Every 8 hours 08/11/20 1429 08/14/20 1603   08/11/20 2200  metroNIDAZOLE (FLAGYL) IVPB 500 mg  Status:  Discontinued        500 mg 100 mL/hr over 60 Minutes Intravenous Every 8 hours 08/11/20 2128 08/14/20 1603   08/11/20 1245  ceFEPIme (MAXIPIME) 2 g in sodium chloride 0.9 % 100 mL IVPB        2 g 200 mL/hr over 30 Minutes Intravenous  Once 08/11/20 1236 08/11/20 1426   08/11/20 1245  metroNIDAZOLE (FLAGYL) IVPB 500 mg         500 mg 100 mL/hr over 60 Minutes Intravenous  Once 08/11/20 1236 08/11/20 1517   08/11/20 1245  vancomycin (VANCOREADY) IVPB 1500 mg/300 mL        1,500 mg 150 mL/hr over 120 Minutes Intravenous  Once 08/11/20 1236 08/11/20 1634      Subjective: Doesn't feel good, doesn't elaborate  Objective: Vitals:   08/19/20 0513 08/19/20 0857 08/19/20 0952 08/19/20 1244  BP: (!) 112/44  131/71 133/77  Pulse: (!) 116  (!) 120 (!) 115  Resp: 20  18   Temp: (!) 97.5 F (36.4 C)  98.4 F (36.9 C) 98.9 F (37.2 C)  TempSrc: Oral  Oral Oral  SpO2: 94% 97% 96% 95%  Weight:      Height:        Intake/Output Summary (Last 24 hours) at 08/19/2020 1710 Last data filed at 08/19/2020 1116 Gross per 24 hour  Intake 180 ml  Output 2800 ml  Net -2620 ml   Filed Weights   08/13/20 0900  Weight: 76.8 kg    Examination:  General: No acute distress. Cardiovascular: Heart sounds show Jaedyn Lard regular rate, and rhythm Lungs: Clear to auscultation bilaterally Abdomen: Soft, nontender, nondistended Neurological: Alert. Cranial nerves II through XII grossly intact. Skin: stage IV decubitus ulcer without gross worsening infection Extremities: No clubbing or cyanosis. No edema. Data Reviewed: I have personally reviewed following labs and imaging studies  CBC: Recent Labs  Lab 08/14/20 0102 08/14/20 0933 08/15/20 0755 08/15/20 1520 08/15/20 2356 08/17/20 0449 08/18/20 0447 08/19/20 0552  WBC 19.4*  --  20.8*  --   --  18.6* 19.1* 17.5*  NEUTROABS 15.7*  --  17.3*  --   --  15.2* 15.4* 13.8*  HGB 8.5*   < > 8.1* 8.7* 8.0* 8.6* 8.4* 8.0*  HCT 26.9*   < > 25.5* 28.0* 24.7* 27.1* 26.2* 24.6*  MCV 79.8*  --  78.0*  --   --  77.9* 76.6* 76.4*  PLT 751*  --  723*  --   --  708* 707* 663*   < > = values in this interval not displayed.    Basic Metabolic Panel: Recent Labs  Lab 08/14/20 0102 08/15/20 0755 08/15/20 0946 08/15/20 2356 08/16/20  1304 08/17/20 0449 08/18/20 0447 08/19/20 0552   NA 134* 138  --   --  131* 136 139 136  K 3.7 2.6*  --   --  4.8 4.0 3.9 3.7  CL 108 111  --   --  104 106 107 106  CO2 16* 18*  --   --  19* 21* 22 23  GLUCOSE 96 114*  --   --  128* 92 100* 96  BUN 8 7  --   --  12 13 20 14   CREATININE 0.40* 0.41*  --  <0.30* 0.43* 0.41* 0.53 0.35*  CALCIUM 8.6* 7.7*  --   --  8.4* 8.8* 8.9 8.7*  MG 1.6*  --  1.6*  --   --  1.7 1.7 1.7  PHOS 1.9*  --   --   --   --  2.8 2.7 2.4*    GFR: Estimated Creatinine Clearance: 80.9 mL/min (Yesha Muchow) (by C-G formula based on SCr of 0.35 mg/dL (L)).  Liver Function Tests: Recent Labs  Lab 08/15/20 0755 08/16/20 1304 08/17/20 0449 08/18/20 0447 08/19/20 0552  AST 18 18 10* 13* 11*  ALT 19 21 18 18 16   ALKPHOS 92 99 97 100 97  BILITOT 0.6 0.6 0.5 0.5 0.6  PROT 4.8* 5.3* 5.0* 5.2* 5.0*  ALBUMIN 1.9* 2.2* 2.0* 2.1* 1.9*    CBG: Recent Labs  Lab 08/18/20 1749 08/18/20 2145 08/19/20 0716 08/19/20 1115 08/19/20 1702  GLUCAP 102* 129* 83 103* 102*     Recent Results (from the past 240 hour(s))  Urine Culture     Status: None   Collection Time: 08/11/20 12:33 PM   Specimen: In/Out Cath Urine  Result Value Ref Range Status   Specimen Description IN/OUT CATH URINE  Final   Special Requests NONE  Final   Culture   Final    NO GROWTH Performed at [facility], [place] [address], [place], [place] [address]    Report Status 08/14/2020 FINAL  Final  Blood Culture (routine x 2)     Status: None   Collection Time: 08/11/20 12:40 PM   Specimen: BLOOD LEFT HAND  Result Value Ref Range Status   Specimen Description BLOOD LEFT HAND  Final   Special Requests   Final    BOTTLES DRAWN AEROBIC ONLY Blood Culture results may not be optimal due to an inadequate volume of blood received in culture bottles   Culture   Final    NO GROWTH 5 DAYS Performed at [facility], [place] [address], [place], [place] [address]    Report Status 08/16/2020 FINAL  Final  Blood Culture (routine x 2)     Status: None    Collection Time: 08/11/20 12:41 PM   Specimen: BLOOD RIGHT ARM  Result Value Ref Range Status   Specimen Description BLOOD RIGHT ARM  Final   Special Requests   Final    BOTTLES DRAWN AEROBIC AND ANAEROBIC Blood Culture adequate volume   Culture   Final    NO GROWTH 5 DAYS Performed at [facility], [place] [address], [place],  [address]    Report Status 08/16/2020 FINAL  Final  Resp Panel by RT-PCR (Flu Terrye Dombrosky&B, Covid) Nasopharyngeal Swab     Status: None   Collection Time: 08/11/20  1:10 PM   Specimen: Nasopharyngeal Swab; Nasopharyngeal(NP) swabs in vial transport medium  Result Value Ref Range Status   SARS Coronavirus 2 by RT PCR NEGATIVE NEGATIVE Final  Comment: (NOTE) SARS-CoV-2 target nucleic acids are NOT DETECTED.  The SARS-CoV-2 RNA is generally detectable in upper respiratory specimens during the acute phase of infection. The lowest concentration of SARS-CoV-2 viral copies this assay can detect is 138 copies/mL. Layson Bertsch negative result does not preclude SARS-Cov-2 infection and should not be used as the sole basis for treatment or other patient management decisions. Charae Depaolis negative result may occur with  improper specimen collection/handling, submission of specimen other than nasopharyngeal swab, presence of viral mutation(s) within the areas targeted by this assay, and inadequate number of viral copies(<138 copies/mL). Kellin Bartling negative result must be combined with clinical observations, patient history, and epidemiological information. The expected result is Negative.  Fact Sheet for Patients:  EntrepreneurPulse.com.au  Fact Sheet for Healthcare Providers:  IncredibleEmployment.be  This test is no t yet approved or cleared by the [place] FDA and  has been authorized for detection and/or diagnosis of SARS-CoV-2 by FDA under an Emergency Use Authorization (EUA). This EUA will remain  in effect (meaning this test can be used) for the  duration of the COVID-19 declaration under Section 564(b)(1) of the Act, 21 U.S.C.section 360bbb-3(b)(1), unless the authorization is terminated  or revoked sooner.       Influenza Ardath Lepak by PCR NEGATIVE NEGATIVE Final   Influenza B by PCR NEGATIVE NEGATIVE Final    Comment: (NOTE) The Xpert Xpress SARS-CoV-2/FLU/RSV plus assay is intended as an aid in the diagnosis of influenza from Nasopharyngeal swab specimens and should not be used as Ivett Luebbe sole basis for treatment. Nasal washings and aspirates are unacceptable for Xpert Xpress SARS-CoV-2/FLU/RSV testing.  Fact Sheet for Patients: EntrepreneurPulse.com.au  Fact Sheet for Healthcare Providers: IncredibleEmployment.be  This test is not yet approved or cleared by the [place] FDA and has been authorized for detection and/or diagnosis of SARS-CoV-2 by FDA under an Emergency Use Authorization (EUA). This EUA will remain in effect (meaning this test can be used) for the duration of the COVID-19 declaration under Section 564(b)(1) of the Act, 21 U.S.C. section 360bbb-3(b)(1), unless the authorization is terminated or revoked.  Performed at [facility], [place] [address] [place], [place] [address]   MRSA PCR Screening     Status: None   Collection Time: 08/12/20 12:29 PM   Specimen: Nasal Mucosa; Nasopharyngeal  Result Value Ref Range Status   MRSA by PCR NEGATIVE NEGATIVE Final    Comment:        The GeneXpert MRSA Assay (FDA approved for NASAL specimens only), is one component of Kiffany Schelling comprehensive MRSA colonization surveillance program. It is not intended to diagnose MRSA infection nor to guide or monitor treatment for MRSA infections. Performed at [facility], [place] [address], [place], [place] [address]   Urine culture     Status: None   Collection Time: 08/12/20  1:34 PM   Specimen: Urine, Catheterized  Result Value Ref Range Status   Specimen Description    Final    URINE, CATHETERIZED Performed at [place] [address] [place], [place] [address]    Special Requests   Final    NONE Performed at [facility], [place] [address] [place], [place] [address]    Culture   Final    NO GROWTH Performed at [facility], [place] [address] [place], [place] [address]    Report Status 08/13/2020 FINAL  Final  Culture, blood (routine x 2)     Status: None (Preliminary result)   Collection  Time: 08/15/20  3:20 PM   Specimen: BLOOD RIGHT HAND  Result Value Ref Range Status   Specimen Description   Final    BLOOD RIGHT HAND Performed at [place] [address] [place], [place] [address]    Special Requests   Final    BOTTLES DRAWN AEROBIC ONLY Blood Culture adequate volume Performed at [place] [address] [place], [place] [address]    Culture   Final    NO GROWTH 4 DAYS Performed at [facility], [place] [address] [place], [place] [address]    Report Status PENDING  Incomplete  Culture, blood (routine x 2)     Status: None (Preliminary result)   Collection Time: 08/15/20  3:20 PM   Specimen: BLOOD LEFT HAND  Result Value Ref Range Status   Specimen Description   Final    BLOOD LEFT HAND Performed at [place] [address], [place], [place] [address]    Special Requests   Final    BOTTLES DRAWN AEROBIC ONLY Blood Culture adequate volume Performed at [place] [address] [place], [place] [address]    Culture   Final    NO GROWTH 4 DAYS Performed at [facility], [place] [address] [place], [place] [address]    Report Status PENDING  Incomplete         Radiology Studies: US THYROID  Result Date: 08/19/2020 CLINICAL DATA:  Right thyroid mass seen on CT EXAM: THYROID ULTRASOUND TECHNIQUE: Ultrasound examination of the thyroid gland and adjacent soft tissues was performed.  COMPARISON:  None. FINDINGS: Parenchymal Echotexture: Mildly heterogeneous Isthmus: 0.3 cm Right lobe: 5.3 x 2.5 x 2.5 cm Left lobe: 5.1 x 2.1 x 1.9 cm _________________________________________________________ Estimated total number of nodules >/= 1 cm: 3 Number of spongiform nodules >/=  2 cm not described below (TR1): 0 Number of mixed cystic and solid nodules >/= 1.5 cm not described below (TR2): 0 _________________________________________________________ Nodule # 1: Location: Right; inferior Maximum size: 3.9 cm; Other 2 dimensions: 2.5 x 3.3 cm Composition: solid/almost completely solid (2) Echogenicity: isoechoic (1) Shape: not taller-than-wide (0) Margins: smooth (0) Echogenic foci: none (0) ACR TI-RADS total points: 3. ACR TI-RADS risk category: TR3 (3 points). ACR TI-RADS recommendations: **Given size (>/= 2.5 cm) and appearance, fine needle aspiration of this mildly suspicious nodule should be considered based on TI-RADS criteria. _________________________________________________________ Nodule # 2: Location: Left; superior Maximum size: 1.6 cm; Other 2 dimensions: 1.2 x 1.4 cm Composition: solid/almost completely solid (2) Echogenicity: isoechoic (1) Shape: not taller-than-wide (0) Margins: smooth (0) Echogenic foci: none (0) ACR TI-RADS total points: 3. ACR TI-RADS risk category: TR3 (3 points). ACR TI-RADS recommendations: *Given size (>/= 1.5 - 2.4 cm) and appearance, Kelsei Defino follow-up ultrasound in 1 year should be considered based on TI-RADS criteria. _________________________________________________________ Cystic nodules in the inferior left thyroid lobe does not meet criteria for FNA or imaging follow-up. IMPRESSION: 1. Nodule 1 (TI-RADS 3) located in the inferior right thyroid lobe measuring 3.9 x 2.5 x 3.3 cm, meets criteria for FNA. 2. Nodule 2 (TI-RADS 3) located in the left superior thyroid lobe, measuring 1.6 x 1.2 x 1.4 cm, meets criteria for imaging follow-up. Next ultrasound recommended in 1  year. The above is in keeping with the ACR TI-RADS recommendations - J Am Coll Radiol 2017;14:587-595. Electronically Signed   By: Miachel Roux M.D.   On: 08/19/2020 07:20   VAS [place] LOWER  EXTREMITY VENOUS (DVT)  Result Date: 08/19/2020  Lower Venous DVT Study Indications: Fever.  Limitations: Patient immobility. Comparison Study: Previous exam 08/12/20 - negative Performing Technologist: Rogelia Rohrer  Examination Guidelines: Haniyyah Sakuma complete evaluation includes B-mode imaging, spectral Doppler, color Doppler, and power Doppler as needed of all accessible portions of each vessel. Bilateral testing is considered an integral part of Nissan Frazzini complete examination. Limited examinations for reoccurring indications may be performed as noted. The reflux portion of the exam is performed with the patient in reverse Trendelenburg.  +---------+---------------+---------+-----------+----------+--------------+ RIGHT    CompressibilityPhasicitySpontaneityPropertiesThrombus Aging +---------+---------------+---------+-----------+----------+--------------+ CFV      Full           Yes      Yes                                 +---------+---------------+---------+-----------+----------+--------------+ SFJ      Full                                                        +---------+---------------+---------+-----------+----------+--------------+ FV Prox  Full           Yes      Yes                                 +---------+---------------+---------+-----------+----------+--------------+ FV Mid   Full           Yes      Yes                                 +---------+---------------+---------+-----------+----------+--------------+ FV DistalFull           Yes      Yes                                 +---------+---------------+---------+-----------+----------+--------------+ PFV      Full                                                         +---------+---------------+---------+-----------+----------+--------------+ POP      Full           Yes      Yes                                 +---------+---------------+---------+-----------+----------+--------------+ PTV      Full                                                        +---------+---------------+---------+-----------+----------+--------------+ PERO     Full                                                        +---------+---------------+---------+-----------+----------+--------------+   +---------+---------------+---------+-----------+----------+--------------+  LEFT     CompressibilityPhasicitySpontaneityPropertiesThrombus Aging +---------+---------------+---------+-----------+----------+--------------+ CFV      Full           Yes      Yes                                 +---------+---------------+---------+-----------+----------+--------------+ SFJ      Full                                                        +---------+---------------+---------+-----------+----------+--------------+ FV Prox  Full           Yes      Yes                                 +---------+---------------+---------+-----------+----------+--------------+ FV Mid   Full           Yes      Yes                                 +---------+---------------+---------+-----------+----------+--------------+ FV DistalFull           Yes      Yes                                 +---------+---------------+---------+-----------+----------+--------------+ PFV      Full                                                        +---------+---------------+---------+-----------+----------+--------------+ POP      Full           Yes      Yes                                 +---------+---------------+---------+-----------+----------+--------------+ PTV      Full                                                         +---------+---------------+---------+-----------+----------+--------------+ PERO     Full                                                        +---------+---------------+---------+-----------+----------+--------------+     Summary: BILATERAL: - No evidence of deep vein thrombosis seen in the lower extremities, bilaterally. - No evidence of superficial venous thrombosis in the lower extremities, bilaterally. -No evidence of popliteal cyst, bilaterally.   *See table(s) above for measurements and observations. Electronically signed by Ruta Hinds MD on 08/19/2020 at 3:15:31 PM.    Final    VAS  [place] UPPER EXTREMITY VENOUS DUPLEX  Result Date: 08/19/2020 UPPER VENOUS STUDY  Indications: Fever Limitations: Patient immobility. Comparison Study: No previous exams Performing Technologist: Rogelia Rohrer  Examination Guidelines: Suhayb Anzalone complete evaluation includes B-mode imaging, spectral Doppler, color Doppler, and power Doppler as needed of all accessible portions of each vessel. Bilateral testing is considered an integral part of Taite Schoeppner complete examination. Limited examinations for reoccurring indications may be performed as noted.  Right Findings: +----------+------------+---------+-----------+----------+-------+ RIGHT     CompressiblePhasicitySpontaneousPropertiesSummary +----------+------------+---------+-----------+----------+-------+ IJV           Full       Yes       Yes                      +----------+------------+---------+-----------+----------+-------+ Subclavian    Full       Yes       Yes                      +----------+------------+---------+-----------+----------+-------+ Axillary      Full       Yes       Yes                      +----------+------------+---------+-----------+----------+-------+ Brachial      Full       Yes       Yes                      +----------+------------+---------+-----------+----------+-------+ Radial        Full                                           +----------+------------+---------+-----------+----------+-------+ Ulnar         Full                                          +----------+------------+---------+-----------+----------+-------+ Cephalic      Full                                          +----------+------------+---------+-----------+----------+-------+ Basilic       Full       Yes       Yes                      +----------+------------+---------+-----------+----------+-------+  Left Findings: +----------+------------+---------+-----------+----------+-------+ LEFT      CompressiblePhasicitySpontaneousPropertiesSummary +----------+------------+---------+-----------+----------+-------+ IJV           Full       Yes       Yes                      +----------+------------+---------+-----------+----------+-------+ Subclavian    Full       Yes       Yes                      +----------+------------+---------+-----------+----------+-------+ Axillary      Full       Yes       Yes                      +----------+------------+---------+-----------+----------+-------+  Brachial      Full       Yes       Yes                      +----------+------------+---------+-----------+----------+-------+ Radial        Full                                          +----------+------------+---------+-----------+----------+-------+ Ulnar         Full                                          +----------+------------+---------+-----------+----------+-------+ Cephalic      Full                                          +----------+------------+---------+-----------+----------+-------+ Basilic       Full       Yes       Yes                      +----------+------------+---------+-----------+----------+-------+  Summary: No evidence of deep vein or superficial vein thrombosis involving the right and left upper extremities.  *See table(s) above for measurements and observations.   Diagnosing physician: Ruta Hinds MD Electronically signed by Ruta Hinds MD on 08/19/2020 at 3:12:00 PM.    Final         Scheduled Meds: . vitamin C  500 mg Oral Daily  . Chlorhexidine Gluconate Cloth  6 each Topical Daily  . dalfampridine  10 mg Oral BID  . feeding supplement  237 mL Oral BID BM  . insulin aspart  0-5 Units Subcutaneous QHS  . insulin aspart  0-9 Units Subcutaneous TID WC  . metoprolol succinate  100 mg Oral Daily  . mometasone-formoterol  2 puff Inhalation BID  . nicotine  21 mg Transdermal Daily  . nutrition supplement (JUVEN)  1 packet Oral BID BM  . nystatin  5 mL Oral QID  . oxybutynin  10 mg Oral Daily  . pantoprazole (PROTONIX) IV  40 mg Intravenous Q12H  . polyethylene glycol  17 g Oral BID  . senna-docusate  1 tablet Oral QHS  . zinc sulfate  220 mg Oral Daily   Continuous Infusions: . meropenem (MERREM) IV 1 g (08/19/20 0915)     LOS: 8 days    Time spent: over 30 min    Fayrene Helper, MD [facility]   To contact the attending provider between 7A-7P or the covering provider during after hours 7P-7A, please log into the web site [email or web address] and access using universal [place] password for that web site. If you do not have the password, please call the hospital operator.  08/19/2020, 5:10 PM

## 2020-08-19 NOTE — TOC Progression Note (Signed)
Transition of Care [facility]) - Progression Note    Patient Details  Name: Tina Griffith MRN: [id number] Date of Birth: 16-Jul-1960  Transition of Care [facility]) CM/SW Contact  Kairee Kozma, Juliann Pulse, RN Phone Number: 08/19/2020, 9:56 AM  Clinical Narrative:  Damaris Schooner to dtr Tereso Newcomer will choose SNF from list to be brought to her by Nsg.Await choice prior starting auth, & covid needed 24hrs of d/c.     Expected Discharge Plan: [place] Barriers to Discharge: Continued Medical Work up  Expected Discharge Plan and Services Expected Discharge Plan: [place]   Discharge Planning Services: CM Consult   Living arrangements for the past 2 months: [place]                                       Social Determinants of Health (SDOH) Interventions    Readmission Risk Interventions Readmission Risk Prevention Plan 06/18/2020  Transportation Screening Complete  PCP or Specialist Appt within 5-7 Days Complete  Home Care Screening Complete  Medication Review (RN CM) Complete  Some recent data might be hidden

## 2020-08-20 ENCOUNTER — Inpatient Hospital Stay (HOSPITAL_COMMUNITY): Payer: Medicare Other

## 2020-08-20 DIAGNOSIS — R5381 Other malaise: Secondary | ICD-10-CM

## 2020-08-20 DIAGNOSIS — A419 Sepsis, unspecified organism: Secondary | ICD-10-CM | POA: Diagnosis not present

## 2020-08-20 DIAGNOSIS — R5081 Fever presenting with conditions classified elsewhere: Secondary | ICD-10-CM

## 2020-08-20 DIAGNOSIS — I69354 Hemiplegia and hemiparesis following cerebral infarction affecting left non-dominant side: Secondary | ICD-10-CM

## 2020-08-20 DIAGNOSIS — D573 Sickle-cell trait: Secondary | ICD-10-CM

## 2020-08-20 DIAGNOSIS — R509 Fever, unspecified: Secondary | ICD-10-CM | POA: Diagnosis not present

## 2020-08-20 DIAGNOSIS — M4628 Osteomyelitis of vertebra, sacral and sacrococcygeal region: Secondary | ICD-10-CM | POA: Diagnosis not present

## 2020-08-20 DIAGNOSIS — D72829 Elevated white blood cell count, unspecified: Secondary | ICD-10-CM | POA: Diagnosis not present

## 2020-08-20 DIAGNOSIS — D75839 Thrombocytosis, unspecified: Secondary | ICD-10-CM

## 2020-08-20 LAB — COMPREHENSIVE METABOLIC PANEL
ALT: 16 U/L (ref 0–44)
AST: 13 U/L — ABNORMAL LOW (ref 15–41)
Albumin: 2 g/dL — ABNORMAL LOW (ref 3.5–5.0)
Alkaline Phosphatase: 99 U/L (ref 38–126)
Anion gap: 11 (ref 5–15)
BUN: 11 mg/dL (ref 6–20)
CO2: 22 mmol/L (ref 22–32)
Calcium: 8.9 mg/dL (ref 8.9–10.3)
Chloride: 102 mmol/L (ref 98–111)
Creatinine, Ser: 0.42 mg/dL — ABNORMAL LOW (ref 0.44–1.00)
GFR, Estimated: >60 mL/min (ref >60)
Glucose, Bld: 97 mg/dL (ref 70–99)
Potassium: 3.6 mmol/L (ref 3.5–5.1)
Sodium: 135 mmol/L (ref 135–145)
Total Bilirubin: 0.7 mg/dL (ref 0.3–1.2)
Total Protein: 5 g/dL — ABNORMAL LOW (ref 6.5–8.1)

## 2020-08-20 LAB — CBC WITH DIFFERENTIAL/PLATELET
Abs Immature Granulocytes: 0.26 10*3/uL — ABNORMAL HIGH (ref 0.00–0.07)
Basophils Absolute: 0.1 10*3/uL (ref 0.0–0.1)
Basophils Relative: 0 %
Eosinophils Absolute: 0.1 10*3/uL (ref 0.0–0.5)
Eosinophils Relative: 0 %
HCT: 24.9 % — ABNORMAL LOW (ref 36.0–46.0)
Hemoglobin: 7.9 g/dL — ABNORMAL LOW (ref 12.0–15.0)
Immature Granulocytes: 2 %
Lymphocytes Relative: 12 %
Lymphs Abs: 2.1 10*3/uL (ref 0.7–4.0)
MCH: 24.3 pg — ABNORMAL LOW (ref 26.0–34.0)
MCHC: 31.7 g/dL (ref 30.0–36.0)
MCV: 76.6 fL — ABNORMAL LOW (ref 80.0–100.0)
Monocytes Absolute: 1.3 10*3/uL — ABNORMAL HIGH (ref 0.1–1.0)
Monocytes Relative: 8 %
Neutro Abs: 13.7 10*3/uL — ABNORMAL HIGH (ref 1.7–7.7)
Neutrophils Relative %: 78 %
Platelets: 655 10*3/uL — ABNORMAL HIGH (ref 150–400)
RBC: 3.25 MIL/uL — ABNORMAL LOW (ref 3.87–5.11)
RDW: 19.7 % — ABNORMAL HIGH (ref 11.5–15.5)
WBC: 17.6 10*3/uL — ABNORMAL HIGH (ref 4.0–10.5)
nRBC: 0.2 % (ref 0.0–0.2)

## 2020-08-20 LAB — GLUCOSE, CAPILLARY
Glucose-Capillary: 100 mg/dL — ABNORMAL HIGH (ref 70–99)
Glucose-Capillary: 111 mg/dL — ABNORMAL HIGH (ref 70–99)
Glucose-Capillary: 116 mg/dL — ABNORMAL HIGH (ref 70–99)
Glucose-Capillary: 185 mg/dL — ABNORMAL HIGH (ref 70–99)

## 2020-08-20 LAB — CULTURE, BLOOD (ROUTINE X 2)
Culture: NO GROWTH
Culture: NO GROWTH
Special Requests: ADEQUATE
Special Requests: ADEQUATE

## 2020-08-20 LAB — PHOSPHORUS: Phosphorus: 2 mg/dL — ABNORMAL LOW (ref 2.5–4.6)

## 2020-08-20 LAB — ECHOCARDIOGRAM LIMITED
Height: 67 in
S' Lateral: 2.6 cm
Weight: 2709.01 oz

## 2020-08-20 LAB — MAGNESIUM: Magnesium: 1.6 mg/dL — ABNORMAL LOW (ref 1.7–2.4)

## 2020-08-20 MED ORDER — PROSOURCE PLUS PO LIQD
30.0000 mL | Freq: Two times a day (BID) | ORAL | Status: DC
  Administered 2020-08-20 – 2020-09-02 (×19): 30 mL via ORAL
  Filled 2020-08-20 (×19): qty 30

## 2020-08-20 MED ORDER — BOOST / RESOURCE BREEZE PO LIQD CUSTOM
1.0000 | Freq: Two times a day (BID) | ORAL | Status: DC
  Administered 2020-08-20 – 2020-08-25 (×9): 1 via ORAL

## 2020-08-20 MED ORDER — MAGNESIUM SULFATE 2 GM/50ML IV SOLN
2.0000 g | Freq: Once | INTRAVENOUS | Status: AC
  Administered 2020-08-20: 2 g via INTRAVENOUS
  Filled 2020-08-20: qty 50

## 2020-08-20 MED ORDER — ADULT MULTIVITAMIN W/MINERALS CH
1.0000 | ORAL_TABLET | Freq: Every day | ORAL | Status: DC
  Administered 2020-08-20 – 2020-09-02 (×14): 1 via ORAL
  Filled 2020-08-20 (×14): qty 1

## 2020-08-20 MED ORDER — SODIUM CHLORIDE 0.9 % IV SOLN
INTRAVENOUS | Status: DC | PRN
  Administered 2020-08-20: 250 mL via INTRAVENOUS

## 2020-08-20 MED ORDER — POTASSIUM PHOSPHATES 15 MMOLE/5ML IV SOLN
15.0000 mmol | Freq: Once | INTRAVENOUS | Status: AC
  Administered 2020-08-20: 15 mmol via INTRAVENOUS
  Filled 2020-08-20: qty 5

## 2020-08-20 MED ORDER — SENNOSIDES-DOCUSATE SODIUM 8.6-50 MG PO TABS
1.0000 | ORAL_TABLET | Freq: Every day | ORAL | Status: DC
  Administered 2020-08-21 – 2020-09-01 (×12): 1 via ORAL
  Filled 2020-08-20 (×12): qty 1

## 2020-08-20 MED ORDER — PANTOPRAZOLE SODIUM 40 MG PO TBEC
40.0000 mg | DELAYED_RELEASE_TABLET | Freq: Two times a day (BID) | ORAL | Status: DC
  Administered 2020-08-20 – 2020-09-02 (×26): 40 mg via ORAL
  Filled 2020-08-20 (×26): qty 1

## 2020-08-20 MED ORDER — POLYETHYLENE GLYCOL 3350 17 G PO PACK
17.0000 g | PACK | Freq: Every day | ORAL | Status: DC
  Administered 2020-08-23 – 2020-09-02 (×10): 17 g via ORAL
  Filled 2020-08-20 (×12): qty 1

## 2020-08-20 NOTE — Progress Notes (Signed)
  Echocardiogram 2D Echocardiogram has been performed.  Randa Lynn Duaine Radin 08/20/2020, 12:24 PM

## 2020-08-20 NOTE — Progress Notes (Signed)
[facility] for Infectious Disease  Date of Admission:  08/11/2020     CC: sepsis, sacral decubitus ulcer infection  Lines: Peripheral iv's  Abx: 3/26-c meropenem  3/24-26 doxy 3/24-26 amp-sulb 3/21-24 cefepime 3/21-24 vanc 3/21-24 metronidazole  ASSESSMENT: Leukocytosis Sacral decub ulcer associated chronic OM Hx covid infection 04/2020 Polypharmacy DM2 Hx MS/CVA with left hemiplegia; hx left optic neuritis Functionally debilitated Hx thyroid goiter Sickle Cell trait  60 yo female chronically debilitated, hx ms/cva left hemiplegia, covid pna 04/2020, recent admission 06/16/20 for sepsis in setting stage IV sacral decub ulcer (s/p I&D 1/27; bcx bacteroides; treated with 2 weeks abx until 2/09 dapto/erta --> doxy/augmentin), readmitted from nursing home 3/21 for family report worsening sacral wound in setting of it often being soiled, meeting sepsis criteria. Of note, does noted to have tarry stools this admission concerning for GIB  #chronic sacral decub -she has had moderate-severe pain since 05/2020 no change -previous admission 05/2020 had I&D but no wound cx sent. Sepsis resolved with that and empiric dapto/erta --> doxy/augmentin. Did have some fever near end of admission in setting of ecoli in urine (non-esbl; R cipro/bactrim, amp-sulb) and had 5 days piptazo -admission here intermittent fever, but stable hemodynamics/moderate leukcoytosis, despite vanc/cefepime/metronidazole --> doxy/amp-sulb, so far appear to have had no fever on meropenem, but too early to tell.  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 After 2 week treatment of this, if she is readmitted again would try to get tissue dx for targetted therapy  #fuo #leukocytosis/thrombocytosis -Noted chronic leukocytosis since  06/16/2020. I have no prior value to compare to -- her last value was 2017 when wbc was normal. She does appear to have a reactive thrombocytosis rising since 05/2020, but has improved since this admission. Has some gib that could account for reactive thrombo/leuckotysosis but gib (tarry stools) rather smoldering. -no blast on exam but does have monocytosis -- chronic infection.  -Has a quantiferon gold 2017 that was indeterminate. Does have bilateral upper lobe scarring and copd changes. She has no respiratory sx otherwise. Although low suspicion for pulm tb, will keep this in backburner. Quant gold is pending repeat (if positive, will treat for ltbi once active tb r/o'ed). She was never told she had TB exposure or had treatment in the past -noted weight loss but no cough/nightsweat since 05/2020. -3/21 bcx negative; repeat 3/25 bcx negative -ct chest/abd/pelv without occult abscess; repeat duplex upper and LE on 3/28 without dvt's -05/2020 tte no obvious vegetation -I review signs/ of drug-fever -- no rash; lft normal; urine 3/21, 2/03 no leukocytes; cr has been normal stable 0.4-0.5. Other FUO related labs checked -- ferritin 270s on 08/14/20. I do not suspect a rheumatologic process at this time -pathology smear review no suggestion of hematologic concerning process. And I agree at this point, clinically doesn't appear to be a hematologic process driving the fever either  -despite improving cbc profile/inflammatory markers, she continues to have fever. I will go ahead and get a tte. Perhaps this will end up with a pet scan if ongoing fever despite everything else looking better -- this will evaluate potentially for the bilateral upper lobes scarring seen on ct as well   PLAN: 1. Continue meropenem for now 2. F/u quant-gold 3. F/u limited echo 4. Follow crp q72 hours 5. Send a pathologist blood  smear review 6. Will see in a couple days if ongoing fever and cbc continues to improve, if we need a pet  scan  I spent more than 25 minute reviewing data/chart, and coordinating care and >50% direct face to face time providing counseling/discussing diagnostics/treatment plan with patient   Principal Problem:   Sepsis ([place]) Active Problems:   Constipation   Tobacco abuse   Anemia   AMS (altered mental status)   Diabetes ([place])   Fecal impaction ([facility])   Dark stools   Sacral osteomyelitis ([place])   Scheduled Meds: . (feeding supplement) PROSource Plus  30 mL Oral BID BM  . vitamin C  500 mg Oral Daily  . Chlorhexidine Gluconate Cloth  6 each Topical Daily  . dalfampridine  10 mg Oral BID  . feeding supplement  1 Container Oral BID BM  . insulin aspart  0-5 Units Subcutaneous QHS  . insulin aspart  0-9 Units Subcutaneous TID WC  . metoprolol succinate  100 mg Oral Daily  . mometasone-formoterol  2 puff Inhalation BID  . multivitamin with minerals  1 tablet Oral Daily  . nicotine  21 mg Transdermal Daily  . nutrition supplement (JUVEN)  1 packet Oral BID BM  . nystatin  5 mL Oral QID  . oxybutynin  10 mg Oral Daily  . pantoprazole  40 mg Oral BID  . [START ON 08/21/2020] polyethylene glycol  17 g Oral Daily  . [START ON 08/21/2020] senna-docusate  1 tablet Oral QHS  . zinc sulfate  220 mg Oral Daily   Continuous Infusions: . meropenem (MERREM) IV 1 g (08/20/20 0938)  . potassium PHOSPHATE IVPB (in mmol) 15 mmol (08/20/20 1425)   PRN Meds:.acetaminophen **OR** acetaminophen, oxyCODONE   SUBJECTIVE: Afebrile last 24hours Reviewed wound with nursing staff --> no surrounding cellulitis; tissue appear healthy; no purulence; deep tracking present however Wbc and platelet improving along with crp and procalcitonin No new sx outside of chronic sacral area pain No rash/itch, headache, cough, chest pain, sob, n/v/abd pain, joint pain, myalgia   Review of Systems: ROS Other ros negative  Allergies  Allergen Reactions  . No Known Allergies     OBJECTIVE: Vitals:   08/20/20 0043  08/20/20 0429 08/20/20 0739 08/20/20 1258  BP: 121/66 130/81  136/68  Pulse: 97 (!) 115  (!) 114  Resp: 20 18  18   Temp: 98.9 F (37.2 C) 99.7 F (37.6 C)  98.7 F (37.1 C)  TempSrc: Oral Oral  Oral  SpO2: 100% 98% 96% 100%  Weight:      Height:       Body mass index is 26.52 kg/m.  Physical Exam  No distress; chronically ill appearing, conversant Heent: normocephalic; right sided medial rectus paresis; oropharynx clear; dentition poor; tongue clear no plaque/rash/ulcer Neck supple cv rrr no mrg Lungs clear; normal respiratory effort abd s/nt Ext no edema Skin no rash Neuro: left hemiplegia; diffusely weak 3/5 lower > upper right extremity; right eye medial rectus palsy Psych alert/oriented Msk: bilateral foot in floating boot support  Lab Results Lab Results  Component Value Date   WBC 17.6 (H) 08/20/2020   HGB 7.9 (L) 08/20/2020   HCT 24.9 (L) 08/20/2020   MCV 76.6 (L) 08/20/2020   PLT 655 (H) 08/20/2020    Lab Results  Component Value Date   CREATININE 0.42 (L) 08/20/2020   BUN 11 08/20/2020   NA 135 08/20/2020   K 3.6 08/20/2020   CL 102 08/20/2020   CO2 22  08/20/2020    Lab Results  Component Value Date   ALT 16 08/20/2020   AST 13 (L) 08/20/2020   ALKPHOS 99 08/20/2020   BILITOT 0.7 08/20/2020     Microbiology: Recent Results (from the past 240 hour(s))  Urine Culture     Status: None   Collection Time: 08/11/20 12:33 PM   Specimen: In/Out Cath Urine  Result Value Ref Range Status   Specimen Description IN/OUT CATH URINE  Final   Special Requests NONE  Final   Culture   Final    NO GROWTH Performed at [facility], [place] [address] [place], [place] [address]    Report Status 08/14/2020 FINAL  Final  Blood Culture (routine x 2)     Status: None   Collection Time: 08/11/20 12:40 PM   Specimen: BLOOD LEFT HAND  Result Value Ref Range Status   Specimen Description BLOOD LEFT HAND  Final   Special Requests   Final    BOTTLES DRAWN  AEROBIC ONLY Blood Culture results may not be optimal due to an inadequate volume of blood received in culture bottles   Culture   Final    NO GROWTH 5 DAYS Performed at [facility], [place] [address], [place], [place] [address]    Report Status 08/16/2020 FINAL  Final  Blood Culture (routine x 2)     Status: None   Collection Time: 08/11/20 12:41 PM   Specimen: BLOOD RIGHT ARM  Result Value Ref Range Status   Specimen Description BLOOD RIGHT ARM  Final   Special Requests   Final    BOTTLES DRAWN AEROBIC AND ANAEROBIC Blood Culture adequate volume   Culture   Final    NO GROWTH 5 DAYS Performed at [facility], [place] [address], [place],  [address]    Report Status 08/16/2020 FINAL  Final  Resp Panel by RT-PCR (Flu A&B, Covid) Nasopharyngeal Swab     Status: None   Collection Time: 08/11/20  1:10 PM   Specimen: Nasopharyngeal Swab; Nasopharyngeal(NP) swabs in vial transport medium  Result Value Ref Range Status   SARS Coronavirus 2 by RT PCR NEGATIVE NEGATIVE Final    Comment: (NOTE) SARS-CoV-2 target nucleic acids are NOT DETECTED.  The SARS-CoV-2 RNA is generally detectable in upper respiratory specimens during the acute phase of infection. The lowest concentration of SARS-CoV-2 viral copies this assay can detect is 138 copies/mL. A negative result does not preclude SARS-Cov-2 infection and should not be used as the sole basis for treatment or other patient management decisions. A negative result may occur with  improper specimen collection/handling, submission of specimen other than nasopharyngeal swab, presence of viral mutation(s) within the areas targeted by this assay, and inadequate number of viral copies(<138 copies/mL). A negative result must be combined with clinical observations, patient history, and epidemiological information. The expected result is Negative.  Fact Sheet for Patients:  EntrepreneurPulse.com.au  Fact Sheet for  Healthcare Providers:  IncredibleEmployment.be  This test is no t yet approved or cleared by the [place] FDA and  has been authorized for detection and/or diagnosis of SARS-CoV-2 by FDA under an Emergency Use Authorization (EUA). This EUA will remain  in effect (meaning this test can be used) for the duration of the COVID-19 declaration under Section 564(b)(1) of the Act, 21 U.S.C.section 360bbb-3(b)(1), unless the authorization is terminated  or revoked sooner.       Influenza A by PCR NEGATIVE NEGATIVE Final   Influenza B  by PCR NEGATIVE NEGATIVE Final    Comment: (NOTE) The Xpert Xpress SARS-CoV-2/FLU/RSV plus assay is intended as an aid in the diagnosis of influenza from Nasopharyngeal swab specimens and should not be used as a sole basis for treatment. Nasal washings and aspirates are unacceptable for Xpert Xpress SARS-CoV-2/FLU/RSV testing.  Fact Sheet for Patients: EntrepreneurPulse.com.au  Fact Sheet for Healthcare Providers: IncredibleEmployment.be  This test is not yet approved or cleared by the [place] FDA and has been authorized for detection and/or diagnosis of SARS-CoV-2 by FDA under an Emergency Use Authorization (EUA). This EUA will remain in effect (meaning this test can be used) for the duration of the COVID-19 declaration under Section 564(b)(1) of the Act, 21 U.S.C. section 360bbb-3(b)(1), unless the authorization is terminated or revoked.  Performed at [facility], [place] [address], [place], [place] [address]   MRSA PCR Screening     Status: None   Collection Time: 08/12/20 12:29 PM   Specimen: Nasal Mucosa; Nasopharyngeal  Result Value Ref Range Status   MRSA by PCR NEGATIVE NEGATIVE Final    Comment:        The GeneXpert MRSA Assay (FDA approved for NASAL specimens only), is one component of a comprehensive MRSA colonization surveillance program. It is not intended to  diagnose MRSA infection nor to guide or monitor treatment for MRSA infections. Performed at [facility], [place] [address], [place], [place] [address]   Urine culture     Status: None   Collection Time: 08/12/20  1:34 PM   Specimen: Urine, Catheterized  Result Value Ref Range Status   Specimen Description   Final    URINE, CATHETERIZED Performed at [place] [address], [place], [place] [address]    Special Requests   Final    NONE Performed at [facility], [place] [address], [place], [place] [address]    Culture   Final    NO GROWTH Performed at [facility], [place] [address], [place], [place] [address]    Report Status 08/13/2020 FINAL  Final  Culture, blood (routine x 2)     Status: None   Collection Time: 08/15/20  3:20 PM   Specimen: BLOOD RIGHT HAND  Result Value Ref Range Status   Specimen Description   Final    BLOOD RIGHT HAND Performed at [facility] [address], [place], [place] [address]    Special Requests   Final    BOTTLES DRAWN AEROBIC ONLY Blood Culture adequate volume Performed at [place] [address], [place], [place] [address]    Culture   Final    NO GROWTH 5 DAYS Performed at [facility], [place] [address], [place], [place] [address]    Report Status 08/20/2020 FINAL  Final  Culture, blood (routine x 2)     Status: None   Collection Time: 08/15/20  3:20 PM   Specimen: BLOOD LEFT HAND  Result Value Ref Range Status   Specimen Description   Final    BLOOD LEFT HAND Performed at [facility] [address] [place], [place] [address]    Special Requests   Final    BOTTLES DRAWN AEROBIC ONLY Blood Culture adequate volume Performed at [facility] [address] [place], [place] [address]    Culture   Final    NO GROWTH 5 DAYS Performed at [facility], [place] [address],  [place],  [address]    Report Status 08/20/2020 FINAL  Final    Serology:  Imaging: If present, new imagings (plain films, ct scans, and mri) have been personally visualized and interpreted; radiology reports have been reviewed. Decision making incorporated into the Impression / Recommendations.  1/26 tte 1. Abnormal septal motion . Left ventricular ejection fraction, by  estimation, is 60 to 65%. The left ventricle has normal function. The left  ventricle has no regional wall motion abnormalities. There is mild left  ventricular hypertrophy. Left  ventricular diastolic parameters were normal.  2. Right ventricular systolic function is mildly reduced. The right  ventricular size is mildly enlarged. There is mildly elevated pulmonary  artery systolic pressure.  3. The mitral valve is normal in structure. Trivial mitral valve  regurgitation. No evidence of mitral stenosis.  4. The aortic valve is tricuspid. Aortic valve regurgitation is not  visualized. No aortic stenosis is present.  5. The inferior vena cava is normal in size with greater than 50%  respiratory variability, suggesting right atrial pressure of 3 mmHg.   3/21 abd pelv ct with contrast Large sacral decubitus ulceration with cortical irregularity at the inferior coccyx which could be due to chronic osteomyelitis as on prior exam. No loculated fluid collections or evidence of acute osteomyelitis  Large amount of colonic stool with a large fecal ball within the Rectum.  3/22 bilateral LE duplex u/s No dvt's  3/27 chest cta No evidence of pulmonary embolism. No acute intrathoracic abnormalities. Emphysematous changes with BILATERAL upper lobe scarring greater on RIGHT. RIGHT thyroid mass 2.8 x 3.5 cm; recommend thyroid ultrasound assessment.  3/28 duplex ext no dvt's  Jabier Mutton, [place] for [place] [phone] pager    08/20/2020, 3:20 PM
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
## 2020-08-20 NOTE — Progress Notes (Signed)
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 meropenem (MERREM) IV 1 g (08/20/20 0938)  . potassium PHOSPHATE IVPB (in mmol)       LOS: 9 days     Vernell Leep, MD, [place], [facility]. [facility]    To contact the attending provider between 7A-7P or the covering provider during after hours 7P-7A, please log into the web site [email or web address] and access using universal [place] password for that web site. If you do not have the password, please call the hospital operator.  08/20/2020, 1:53 PM

## 2020-08-21 DIAGNOSIS — A419 Sepsis, unspecified organism: Secondary | ICD-10-CM | POA: Diagnosis not present

## 2020-08-21 DIAGNOSIS — M4628 Osteomyelitis of vertebra, sacral and sacrococcygeal region: Secondary | ICD-10-CM | POA: Diagnosis not present

## 2020-08-21 DIAGNOSIS — R509 Fever, unspecified: Secondary | ICD-10-CM | POA: Diagnosis not present

## 2020-08-21 LAB — BASIC METABOLIC PANEL
Anion gap: 11 (ref 5–15)
BUN: 8 mg/dL (ref 6–20)
CO2: 24 mmol/L (ref 22–32)
Calcium: 8.5 mg/dL — ABNORMAL LOW (ref 8.9–10.3)
Chloride: 101 mmol/L (ref 98–111)
Creatinine, Ser: 0.47 mg/dL (ref 0.44–1.00)
GFR, Estimated: >60 mL/min (ref >60)
Glucose, Bld: 116 mg/dL — ABNORMAL HIGH (ref 70–99)
Potassium: 3.4 mmol/L — ABNORMAL LOW (ref 3.5–5.1)
Sodium: 136 mmol/L (ref 135–145)

## 2020-08-21 LAB — CBC
HCT: 24.2 % — ABNORMAL LOW (ref 36.0–46.0)
Hemoglobin: 7.7 g/dL — ABNORMAL LOW (ref 12.0–15.0)
MCH: 24.4 pg — ABNORMAL LOW (ref 26.0–34.0)
MCHC: 31.8 g/dL (ref 30.0–36.0)
MCV: 76.6 fL — ABNORMAL LOW (ref 80.0–100.0)
Platelets: 644 10*3/uL — ABNORMAL HIGH (ref 150–400)
RBC: 3.16 MIL/uL — ABNORMAL LOW (ref 3.87–5.11)
RDW: 19.3 % — ABNORMAL HIGH (ref 11.5–15.5)
WBC: 15.6 10*3/uL — ABNORMAL HIGH (ref 4.0–10.5)
nRBC: 0.3 % — ABNORMAL HIGH (ref 0.0–0.2)

## 2020-08-21 LAB — QUANTIFERON-TB GOLD PLUS (RQFGPL)
QuantiFERON Mitogen Value: 2.69 IU/mL
QuantiFERON Nil Value: 0.09 IU/mL
QuantiFERON TB1 Ag Value: 0.09 IU/mL
QuantiFERON TB2 Ag Value: 0.09 IU/mL

## 2020-08-21 LAB — GLUCOSE, CAPILLARY
Glucose-Capillary: 112 mg/dL — ABNORMAL HIGH (ref 70–99)
Glucose-Capillary: 142 mg/dL — ABNORMAL HIGH (ref 70–99)
Glucose-Capillary: 100 mg/dL — ABNORMAL HIGH (ref 70–99)
Glucose-Capillary: 146 mg/dL — ABNORMAL HIGH (ref 70–99)

## 2020-08-21 LAB — MAGNESIUM: Magnesium: 1.9 mg/dL (ref 1.7–2.4)

## 2020-08-21 LAB — QUANTIFERON-TB GOLD PLUS: QuantiFERON-TB Gold Plus: NEGATIVE

## 2020-08-21 LAB — PHOSPHORUS: Phosphorus: 2 mg/dL — ABNORMAL LOW (ref 2.5–4.6)

## 2020-08-21 MED ORDER — SODIUM CHLORIDE 0.9 % IV SOLN
1.0000 g | Freq: Every day | INTRAVENOUS | Status: DC
  Administered 2020-08-21 – 2020-09-02 (×13): 1000 mg via INTRAVENOUS
  Filled 2020-08-21 (×13): qty 1

## 2020-08-21 MED ORDER — POTASSIUM CHLORIDE CRYS ER 20 MEQ PO TBCR
40.0000 meq | EXTENDED_RELEASE_TABLET | Freq: Once | ORAL | Status: AC
  Administered 2020-08-21: 40 meq via ORAL
  Filled 2020-08-21: qty 2

## 2020-08-21 MED ORDER — POTASSIUM PHOSPHATES 15 MMOLE/5ML IV SOLN
20.0000 mmol | Freq: Once | INTRAVENOUS | Status: AC
  Administered 2020-08-21: 20 mmol via INTRAVENOUS
  Filled 2020-08-21: qty 6.67

## 2020-08-21 NOTE — Progress Notes (Signed)
Pharmacy Electrolyte Replacement  Pharmacy consulted to replace low phosphorus this AM  Recent Labs:  Recent Labs    08/21/20 0500  K 3.4*  MG 1.9  PHOS 2.0*  CREATININE 0.47    Low Critical Values (K </= 2.5, Phos </= 1, Mg </= 1) Present: K = 3.4 and Phos = 2.0    Plan: KPhos 45mmol IV x 1          Check Phos with AM labs on 4/1  Will sign off at this time.  Please reconsult if pharmacy assistance needed further.  Leone Haven, PharmD 08/21/20 @ 06:14

## 2020-08-21 NOTE — TOC Progression Note (Signed)
Transition of Care [facility]) - Progression Note    Patient Details  Name: Tina Griffith MRN: [id number] Date of Birth: [birth year]  Transition [facility] CM/SW Contact  Maison Kestenbaum, Juliann Pulse, RN Phone Number: 08/21/2020, 3:23 PM  Clinical Narrative:  Damaris Schooner to dtr [facility] await choice.     Expected Discharge Plan: [place] Barriers to Discharge: Continued Medical Work up  Expected Discharge Plan and Services Expected Discharge Plan: [place]   Discharge Planning Services: CM Consult   Living arrangements for the past 2 months: [place]                                       Social Determinants of Health (SDOH) Interventions    Readmission Risk Interventions Readmission Risk Prevention Plan 06/18/2020  Transportation Screening Complete  PCP or Specialist Appt within 5-7 Days Complete  Home Care Screening Complete  Medication Review (RN CM) Complete  Some recent data might be hidden

## 2020-08-21 NOTE — Progress Notes (Addendum)
[facility] for Infectious Disease  Date of Admission:  08/11/2020     CC: sepsis, sacral decubitus ulcer infection  Lines: Peripheral iv's  Abx: 3/26-c meropenem  3/24-26 doxy 3/24-26 amp-sulb 3/21-24 cefepime 3/21-24 vanc 3/21-24 metronidazole  ASSESSMENT: Leukocytosis Sacral decub ulcer associated chronic OM Hx covid infection 04/2020 Polypharmacy DM2 Hx MS/CVA with left hemiplegia; hx left optic neuritis Functionally debilitated Hx thyroid goiter Sickle Cell trait  60 yo female chronically debilitated, hx ms/cva left hemiplegia, covid pna 04/2020, recent admission 06/16/20 for sepsis in setting stage IV sacral decub ulcer (s/p I&D 1/27; bcx bacteroides; treated with 2 weeks abx until 2/09 dapto/erta --> doxy/augmentin), readmitted from nursing home 3/21 for family report worsening sacral wound in setting of it often being soiled, meeting sepsis criteria. Of note, does noted to have tarry stools this admission concerning for GIB  #chronic sacral decub chronic sacral pain with onset of ulcer since 05/2020 stable Had I&D previous admission 05/2020 but no wound cx sent Rising wbc/platelet until meropenem started (was on doxy/augmentin, cefepime at times previously) Frequent soiling of the ulcer. Surgery reviewed and not a candidate for colostomy crp improving as well with meropenem I discussed with her given her current status being bedbound and unlikely the ulcer is closing, I am very hesitant to treat her for full 6 weeks of abx, especially now we need to escalate to higher risk abx such as carbapenem  -please discuss case with palliative care -I think it is reasonable to do 6 weeks tx chronic sacral osteomyelitis this time, but if she is readmitted after the treatment for recurrent sacral decub associated OM, would only do 2 week tx at a time for soft tissue infection/abscess, if congruent with goals of care -for now I will change abx to ertapenem from  meropenem in preparation for disposition, as that is once a day medication (but without pseudomonal coverage)  #fuo #leukocytosis/thrombocytosis It appears she had leukocytosis since the dx of sacral ulcer. Although of note she had covid just prior, and unclear if the leukocytosis/fever is related to long covid Repeat bcx has been negative CT chest 3/27 and ct abd pelv 3/21 without imaging suggestion pneumonia, lymphadenopathy, occult abscess  Repeat TTE 3/30 also no evidence endocarditis She does have bilateral upper lobe scarring and an indeterminate quantiferon-gold in the past (and no prior tx or known tb dx). I have low suspicion that the bilateral upper lobe scarring is anything to suggest active tb, although likely benefit from repeat ct in the future.  No evidence drug fever/rheum-hematologic malignant process at this time  At this time, as the ulcer/OM appears to be responding, I am at a loss for why she continue to have these intermittent assymptomatic fever  -I would consider getting the pet ct scan to help with diagnosis, as likely ongoing fever is a limiting factor in disposition to nursing home -if the pet is negative, I would chalk it up to the possibility of long covid -I will also sent endemic fungi/qfever/brucella/bartonella serology to complete FUO w/u but I have low suspicion for any of these. These can be followed outpatient  #bilateral upper lobe scarring #hx indeterminate quantiferon gold As above suggested, very low suspicion of active tb If quant-gold repeat is positive, could consider treating for latent tb as she is a nursing home resident/advance age  -consider repeat chest imaging in future to document stability of upper lobe changes -f/u quantiferon gold    I spent  more than 35 minute reviewing data/chart, and coordinating care and >50% direct face to face time providing counseling/discussing diagnostics/treatment plan with patient  Principal Problem:    Sepsis ([place]) Active Problems:   Constipation   Tobacco abuse   Anemia   Fever, unknown origin   AMS (altered mental status)   Diabetes ([place])   Fecal impaction ([place])   Dark stools   Sacral osteomyelitis ([place])   Scheduled Meds: . (feeding supplement) PROSource Plus  30 mL Oral BID BM  . vitamin C  500 mg Oral Daily  . Chlorhexidine Gluconate Cloth  6 each Topical Daily  . dalfampridine  10 mg Oral BID  . feeding supplement  1 Container Oral BID BM  . insulin aspart  0-5 Units Subcutaneous QHS  . insulin aspart  0-9 Units Subcutaneous TID WC  . metoprolol succinate  100 mg Oral Daily  . mometasone-formoterol  2 puff Inhalation BID  . multivitamin with minerals  1 tablet Oral Daily  . nicotine  21 mg Transdermal Daily  . nutrition supplement (JUVEN)  1 packet Oral BID BM  . nystatin  5 mL Oral QID  . oxybutynin  10 mg Oral Daily  . pantoprazole  40 mg Oral BID  . polyethylene glycol  17 g Oral Daily  . senna-docusate  1 tablet Oral QHS  . zinc sulfate  220 mg Oral Daily   Continuous Infusions: . sodium chloride 250 mL (08/20/20 1815)  . meropenem (MERREM) IV 1 g (08/21/20 0840)  . potassium PHOSPHATE IVPB (in mmol) 20 mmol (08/21/20 0705)   PRN Meds:.sodium chloride, acetaminophen **OR** acetaminophen, oxyCODONE   SUBJECTIVE: Patient had assymptomatic fever to 102 last pm She had some diarrhea but seems to be improved after holding laxatives She has no other complaint outside of sacral pain She denies cough/sob/n/v/rash She remains very debilitated/bedbound  Review of Systems: ROS All other ROS negative, except above  Allergies  Allergen Reactions  . No Known Allergies     OBJECTIVE: Vitals:   08/21/20 0416 08/21/20 0512 08/21/20 0850 08/21/20 0912  BP: 118/61 118/65 123/69   Pulse: (!) 119 (!) 112 (!) 109   Resp: 20 20 (!) 21   Temp: (!) 101 F (38.3 C) 98.6 F (37 C) (!) 97 F (36.1 C)   TempSrc: Oral Oral Oral   SpO2: 99% 98% 97% 98%  Weight:       Height:       Body mass index is 26.52 kg/m.  Physical Exam  General/constitutional: no distress, pleasant HEENT: Normocephalic, PER, Conj Clear, EOMI, Oropharynx clear; several missing teeth Neck supple CV: rrr no mrg Lungs: clear to auscultation, normal respiratory effort Abd: Soft, Nontender Ext: no edema Skin: No Rash Neuro: left hemiplegia; diffuse generalized weakness strength on best right side 3-4/5 MSK: no peripheral joint swelling/tenderness/warmth; back spines nontender; bilateral feet in floating support   Central line presence: no   Lab Results Lab Results  Component Value Date   WBC 15.6 (H) 08/21/2020   HGB 7.7 (L) 08/21/2020   HCT 24.2 (L) 08/21/2020   MCV 76.6 (L) 08/21/2020   PLT 644 (H) 08/21/2020    Lab Results  Component Value Date   CREATININE 0.47 08/21/2020   BUN 8 08/21/2020   NA 136 08/21/2020   K 3.4 (L) 08/21/2020   CL 101 08/21/2020   CO2 24 08/21/2020    Lab Results  Component Value Date   ALT 16 08/20/2020   AST 13 (L) 08/20/2020  ALKPHOS 99 08/20/2020   BILITOT 0.7 08/20/2020     Microbiology: Recent Results (from the past 240 hour(s))  Urine Culture     Status: None   Collection Time: 08/11/20 12:33 PM   Specimen: In/Out Cath Urine  Result Value Ref Range Status   Specimen Description IN/OUT CATH URINE  Final   Special Requests NONE  Final   Culture   Final    NO GROWTH Performed at [facility], [address] [place], [place] [address]    Report Status 08/14/2020 FINAL  Final  Blood Culture (routine x 2)     Status: None   Collection Time: 08/11/20 12:40 PM   Specimen: BLOOD LEFT HAND  Result Value Ref Range Status   Specimen Description BLOOD LEFT HAND  Final   Special Requests   Final    BOTTLES DRAWN AEROBIC ONLY Blood Culture results may not be optimal due to an inadequate volume of blood received in culture bottles   Culture   Final    NO GROWTH 5 DAYS Performed at [facility], [place]  [address], [place], [place] [address]    Report Status 08/16/2020 FINAL  Final  Blood Culture (routine x 2)     Status: None   Collection Time: 08/11/20 12:41 PM   Specimen: BLOOD RIGHT ARM  Result Value Ref Range Status   Specimen Description BLOOD RIGHT ARM  Final   Special Requests   Final    BOTTLES DRAWN AEROBIC AND ANAEROBIC Blood Culture adequate volume   Culture   Final    NO GROWTH 5 DAYS Performed at [facility], [place] [address], [place], [place] [address]    Report Status 08/16/2020 FINAL  Final  Resp Panel by RT-PCR (Flu A&B, Covid) Nasopharyngeal Swab     Status: None   Collection Time: 08/11/20  1:10 PM   Specimen: Nasopharyngeal Swab; Nasopharyngeal(NP) swabs in vial transport medium  Result Value Ref Range Status   SARS Coronavirus 2 by RT PCR NEGATIVE NEGATIVE Final    Comment: (NOTE) SARS-CoV-2 target nucleic acids are NOT DETECTED.  The SARS-CoV-2 RNA is generally detectable in upper respiratory specimens during the acute phase of infection. The lowest concentration of SARS-CoV-2 viral copies this assay can detect is 138 copies/mL. A negative result does not preclude SARS-Cov-2 infection and should not be used as the sole basis for treatment or other patient management decisions. A negative result may occur with  improper specimen collection/handling, submission of specimen other than nasopharyngeal swab, presence of viral mutation(s) within the areas targeted by this assay, and inadequate number of viral copies(<138 copies/mL). A negative result must be combined with clinical observations, patient history, and epidemiological information. The expected result is Negative.  Fact Sheet for Patients:  EntrepreneurPulse.com.au  Fact Sheet for Healthcare Providers:  IncredibleEmployment.be  This test is no t yet approved or cleared by the [place] FDA and  has been authorized for detection and/or diagnosis of SARS-CoV-2  by FDA under an Emergency Use Authorization (EUA). This EUA will remain  in effect (meaning this test can be used) for the duration of the COVID-19 declaration under Section 564(b)(1) of the Act, 21 U.S.C.section 360bbb-3(b)(1), unless the authorization is terminated  or revoked sooner.       Influenza A by PCR NEGATIVE NEGATIVE Final   Influenza B by PCR NEGATIVE NEGATIVE Final    Comment: (NOTE) The Xpert Xpress SARS-CoV-2/FLU/RSV plus assay is intended as an aid in the  diagnosis of influenza from Nasopharyngeal swab specimens and should not be used as a sole basis for treatment. Nasal washings and aspirates are unacceptable for Xpert Xpress SARS-CoV-2/FLU/RSV testing.  Fact Sheet for Patients: EntrepreneurPulse.com.au  Fact Sheet for Healthcare Providers: IncredibleEmployment.be  This test is not yet approved or cleared by the [place] FDA and has been authorized for detection and/or diagnosis of SARS-CoV-2 by FDA under an Emergency Use Authorization (EUA). This EUA will remain in effect (meaning this test can be used) for the duration of the COVID-19 declaration under Section 564(b)(1) of the Act, 21 U.S.C. section 360bbb-3(b)(1), unless the authorization is terminated or revoked.  Performed at [facility], [place] [address] [place], [place] [address]   MRSA PCR Screening     Status: None   Collection Time: 08/12/20 12:29 PM   Specimen: Nasal Mucosa; Nasopharyngeal  Result Value Ref Range Status   MRSA by PCR NEGATIVE NEGATIVE Final    Comment:        The GeneXpert MRSA Assay (FDA approved for NASAL specimens only), is one component of a comprehensive MRSA colonization surveillance program. It is not intended to diagnose MRSA infection nor to guide or monitor treatment for MRSA infections. Performed at [facility], [place] [address], [place], [place] [address]   Urine culture     Status: None    Collection Time: 08/12/20  1:34 PM   Specimen: Urine, Catheterized  Result Value Ref Range Status   Specimen Description   Final    URINE, CATHETERIZED Performed at [place] [address], [place], [place] [address]    Special Requests   Final    NONE Performed at [facility], [place] [address], [place], [place] [address]    Culture   Final    NO GROWTH Performed at [facility], [place] [address] [place], [place] [address]    Report Status 08/13/2020 FINAL  Final  Culture, blood (routine x 2)     Status: None   Collection Time: 08/15/20  3:20 PM   Specimen: BLOOD RIGHT HAND  Result Value Ref Range Status   Specimen Description   Final    BLOOD RIGHT HAND Performed at [place] [address], [place], [place] [address]    Special Requests   Final    BOTTLES DRAWN AEROBIC ONLY Blood Culture adequate volume Performed at [facility] [facility] [address], [place], [place] [address]    Culture   Final    NO GROWTH 5 DAYS Performed at [facility], [place] [address], [place], [place] [address]    Report Status 08/20/2020 FINAL  Final  Culture, blood (routine x 2)     Status: None   Collection Time: 08/15/20  3:20 PM   Specimen: BLOOD LEFT HAND  Result Value Ref Range Status   Specimen Description   Final    BLOOD LEFT HAND Performed at [facility] [facility] [address], [place], [place] [address]    Special Requests   Final    BOTTLES DRAWN AEROBIC ONLY Blood Culture adequate volume Performed at [place] [name], [place], [place] [address]    Culture   Final    NO GROWTH 5 DAYS Performed at [facility], [place] [address], [place], [place] [address]    Report Status 08/20/2020 FINAL  Final    Serology:  Imaging: If present, new imagings (plain  films, ct scans, and mri) have been personally visualized and interpreted; radiology reports  have been reviewed. Decision making incorporated into the Impression / Recommendations.  1/26 tte 1. Abnormal septal motion . Left ventricular ejection fraction, by  estimation, is 60 to 65%. The left ventricle has normal function. The left  ventricle has no regional wall motion abnormalities. There is mild left  ventricular hypertrophy. Left  ventricular diastolic parameters were normal.  2. Right ventricular systolic function is mildly reduced. The right  ventricular size is mildly enlarged. There is mildly elevated pulmonary  artery systolic pressure.  3. The mitral valve is normal in structure. Trivial mitral valve  regurgitation. No evidence of mitral stenosis.  4. The aortic valve is tricuspid. Aortic valve regurgitation is not  visualized. No aortic stenosis is present.  5. The inferior vena cava is normal in size with greater than 50%  respiratory variability, suggesting right atrial pressure of 3 mmHg.   3/21 abd pelv ct with contrast Large sacral decubitus ulceration with cortical irregularity at the inferior coccyx which could be due to chronic osteomyelitis as on prior exam. No loculated fluid collections or evidence of acute osteomyelitis  Large amount of colonic stool with a large fecal ball within the Rectum.  3/22 bilateral LE duplex u/s No dvt's  3/27 chest cta No evidence of pulmonary embolism. No acute intrathoracic abnormalities. Emphysematous changes with BILATERAL upper lobe scarring greater on RIGHT. RIGHT thyroid mass 2.8 x 3.5 cm; recommend thyroid ultrasound assessment.  3/28 duplex ext no dvt's  3/30 TTE 1. Left ventricular ejection fraction, by estimation, is 60 to 65%. The  left ventricle has normal function. The left ventricle has no regional  wall motion abnormalities.  2. Right ventricular systolic function is normal. The right ventricular  size is normal. There is normal pulmonary artery systolic pressure.  3. The mitral valve  is normal in structure. Trivial mitral valve  regurgitation. No evidence of mitral stenosis.  4. The aortic valve is tricuspid. Aortic valve regurgitation is not  visualized. No aortic stenosis is present.  5. The inferior vena cava is normal in size with greater than 50%  respiratory variability, suggesting right atrial pressure of 3 mmHg.   Jabier Mutton, [place] for Infectious [place] [phone] pager    08/21/2020, 11:20 AM
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
## 2020-08-21 NOTE — Progress Notes (Signed)
PHARMACY CONSULT NOTE FOR:  OUTPATIENT  PARENTERAL ANTIBIOTIC THERAPY (OPAT)  OPAT request previously entered for Unasyn + PO doxy (see 3/25 RPh note). Today, ID modifying abx to Ertapenem based on updated clinical status. End date to remain the same.  Indication: Sacral decubitus ulcer with chronic osteomyelitis Regimen: Ertapenem 1 gm IV daily End date: 09/11/2020  IV antibiotic discharge orders are pended. To discharging provider:  please sign these orders via discharge navigator,  Select New Orders & click on the button choice - Manage This Unsigned Work.     Thank you for allowing pharmacy to be a part of this patient's care.  Reuel Boom, PharmD, BCPS [phone] 08/21/2020, 1:38 PM

## 2020-08-21 NOTE — Care Management Important Message (Signed)
Important Message  Patient Details IM Letter given to the Patient. Name: Tina Griffith MRN: [id number] Date of Birth: 04-21-1961   Medicare Important Message Given:  Yes     Kerin Salen 08/21/2020, 11:52 AM

## 2020-08-22 ENCOUNTER — Inpatient Hospital Stay: Payer: Self-pay

## 2020-08-22 DIAGNOSIS — R531 Weakness: Secondary | ICD-10-CM

## 2020-08-22 DIAGNOSIS — L89150 Pressure ulcer of sacral region, unstageable: Secondary | ICD-10-CM

## 2020-08-22 DIAGNOSIS — Z7189 Other specified counseling: Secondary | ICD-10-CM | POA: Diagnosis not present

## 2020-08-22 DIAGNOSIS — A419 Sepsis, unspecified organism: Secondary | ICD-10-CM | POA: Diagnosis not present

## 2020-08-22 DIAGNOSIS — E1121 Type 2 diabetes mellitus with diabetic nephropathy: Secondary | ICD-10-CM

## 2020-08-22 DIAGNOSIS — M4628 Osteomyelitis of vertebra, sacral and sacrococcygeal region: Secondary | ICD-10-CM | POA: Diagnosis not present

## 2020-08-22 DIAGNOSIS — Z515 Encounter for palliative care: Secondary | ICD-10-CM

## 2020-08-22 DIAGNOSIS — E079 Disorder of thyroid, unspecified: Secondary | ICD-10-CM

## 2020-08-22 DIAGNOSIS — R509 Fever, unspecified: Secondary | ICD-10-CM | POA: Diagnosis not present

## 2020-08-22 LAB — BASIC METABOLIC PANEL
Anion gap: 8 (ref 5–15)
BUN: 5 mg/dL — ABNORMAL LOW (ref 6–20)
CO2: 23 mmol/L (ref 22–32)
Calcium: 8.9 mg/dL (ref 8.9–10.3)
Chloride: 102 mmol/L (ref 98–111)
Creatinine, Ser: 0.38 mg/dL — ABNORMAL LOW (ref 0.44–1.00)
GFR, Estimated: >60 mL/min (ref >60)
Glucose, Bld: 103 mg/dL — ABNORMAL HIGH (ref 70–99)
Potassium: 4.2 mmol/L (ref 3.5–5.1)
Sodium: 133 mmol/L — ABNORMAL LOW (ref 135–145)

## 2020-08-22 LAB — CBC
HCT: 27.6 % — ABNORMAL LOW (ref 36.0–46.0)
Hemoglobin: 9 g/dL — ABNORMAL LOW (ref 12.0–15.0)
MCH: 24.3 pg — ABNORMAL LOW (ref 26.0–34.0)
MCHC: 32.6 g/dL (ref 30.0–36.0)
MCV: 74.4 fL — ABNORMAL LOW (ref 80.0–100.0)
Platelets: 622 10*3/uL — ABNORMAL HIGH (ref 150–400)
RBC: 3.71 MIL/uL — ABNORMAL LOW (ref 3.87–5.11)
RDW: 19.3 % — ABNORMAL HIGH (ref 11.5–15.5)
WBC: 17.6 10*3/uL — ABNORMAL HIGH (ref 4.0–10.5)
nRBC: 0.3 % — ABNORMAL HIGH (ref 0.0–0.2)

## 2020-08-22 LAB — BARTONELLA ANTIBODY PANEL
B Quintana IgM: NEGATIVE titer
B henselae IgG: NEGATIVE titer
B henselae IgM: NEGATIVE titer
B quintana IgG: NEGATIVE titer

## 2020-08-22 LAB — GLUCOSE, CAPILLARY
Glucose-Capillary: 95 mg/dL (ref 70–99)
Glucose-Capillary: 113 mg/dL — ABNORMAL HIGH (ref 70–99)
Glucose-Capillary: 102 mg/dL — ABNORMAL HIGH (ref 70–99)
Glucose-Capillary: 134 mg/dL — ABNORMAL HIGH (ref 70–99)

## 2020-08-22 LAB — PHOSPHORUS: Phosphorus: 2 mg/dL — ABNORMAL LOW (ref 2.5–4.6)

## 2020-08-22 LAB — BRUCELLA ANTIBODY IGG, EIA: Brucella Antibody IgG, EIA: NEGATIVE

## 2020-08-22 LAB — Q FEVER ANTIBODIES, IGG
Q Fever Phase I: NEGATIVE
Q Fever Phase II: NEGATIVE

## 2020-08-22 LAB — C-REACTIVE PROTEIN: CRP: 8.1 mg/dL — ABNORMAL HIGH (ref <1.0)

## 2020-08-22 LAB — MAGNESIUM: Magnesium: 1.6 mg/dL — ABNORMAL LOW (ref 1.7–2.4)

## 2020-08-22 MED ORDER — SODIUM CHLORIDE 0.9% FLUSH
10.0000 mL | Freq: Two times a day (BID) | INTRAVENOUS | Status: DC
  Administered 2020-08-22 – 2020-09-01 (×9): 10 mL

## 2020-08-22 MED ORDER — MAGNESIUM SULFATE 50 % IJ SOLN
3.0000 g | Freq: Once | INTRAVENOUS | Status: AC
  Administered 2020-08-22: 3 g via INTRAVENOUS
  Filled 2020-08-22: qty 6

## 2020-08-22 MED ORDER — SODIUM CHLORIDE 0.9% FLUSH
10.0000 mL | INTRAVENOUS | Status: DC | PRN
  Administered 2020-08-27: 10 mL
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
## 2020-08-22 NOTE — TOC Progression Note (Addendum)
Transition of Care [facility]) - Progression Note    Patient Details  Name: Tina Griffith MRN: [id number] Date of Birth: 05/25/1960  Transition of Care [facility]) CM/SW Contact  Dafna Romo, Juliann Pulse, RN Phone Number: 08/22/2020, 9:32 AM  Clinical Narrative:  TC dtr Devon-she is still undecided about SNF choice-she will discuss with her brother & let me know today.  9:39a-Noted iv abx ertapenem 1gm qd to end 4/21. 10a-Received call from [phone]) with her aunt(Crystal Koleen Nimrod tel#[phone]) on speaker phone-concerns abut SNF choices-she would prefer-[facility], [facility], [facility]-attempted to explain that some they have preferred are ALF-she needs higher level of care, & we are constantly following for the right transition-will inform my supv, of current status. 12:30p-spoke to AF rep [facility] Masker is looking into if able to accept-concerns are-would need Booster, expensive iv abx(invanz)-they would try to carve out/recc a more reasonable iv abx/may take social security check as they bill caid. Will also fax out again to add iv abx.We can contact [facility] dtr [phone].Updated Crystal-voiced understanding.     Expected Discharge Plan: [place] Barriers to Discharge: Continued Medical Work up  Expected Discharge Plan and Services Expected Discharge Plan: [place]   Discharge Planning Services: CM Consult   Living arrangements for the past 2 months: [place]                                       Social Determinants of Health (SDOH) Interventions    Readmission Risk Interventions Readmission Risk Prevention Plan 06/18/2020  Transportation Screening Complete  PCP or Specialist Appt within 5-7 Days Complete  Home Care Screening Complete  Medication Review (RN CM) Complete  Some recent data might be hidden

## 2020-08-22 NOTE — NC FL2 (Signed)
[place] LEVEL OF CARE SCREENING TOOL     IDENTIFICATION  Patient Name: Tina Griffith Birthdate: 11-01-1960 Sex: female Admission Date (Current Location): 08/11/2020  [place] and [place] Number:  [name] [id number] [place] and Address:  [facility],  [place], [place]      Provider Number: [id number]  Attending Physician Name and Address:  Allie Bossier, MD  Relative Name and Phone Number:  [name] tr [phone]    Current Level of Care: Hospital Recommended Level of Care: [place] Prior Approval Number:    Date Approved/Denied:   PASRR Number: [id number]  Discharge Plan: Other (Comment) (SNF or LTC)    Current Diagnoses: Patient Active Problem List   Diagnosis Date Noted  . Sacral osteomyelitis ([place])   . Fecal impaction ([place])   . Dark stools   . Sepsis ([place]) 08/11/2020  . AMS (altered mental status) 08/11/2020  . Diabetes ([place]) 08/11/2020  . FUO (fever of unknown origin) 06/26/2020  . Hypoxia   . Impaction of colon ([place])   . Anemia   . Malnutrition of moderate degree 06/19/2020  . Wound infection 06/17/2020  . Leucocytosis 06/16/2020  . Sepsis with acute organ dysfunction without septic shock ([place]) 06/16/2020  . UTI (urinary tract infection) 06/16/2020  . Depression with anxiety 04/20/2016  . Insomnia 04/20/2016  . Chronic prescription opiate use 01/16/2016  . Hypertriglyceridemia 06/20/2015  . Abnormal urine odor 10/09/2014  . Chronic kidney disease 10/09/2014  . Elevated WBC count 10/09/2014  . Multiple sclerosis ([place]) 07/25/2014  . Spastic gait 07/25/2014  . Leg pain, left 07/25/2014  . Urinary frequency 07/25/2014  . Constipation 07/25/2014  . Decreased potassium in the blood 04/22/2014  . Hypomagnesemia 04/22/2014  . Ataxic gait 02/28/2014  . Callosity 02/28/2014  . 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 Contracture of ankle and foot joint  02/28/2014  . Decubital ulcer 02/28/2014  . Fibroid 02/28/2014  . Dysfunctional or functional uterine hemorrhage 02/28/2014  . Incisional hernia with obstruction but no gangrene 02/28/2014  . Gonalgia 02/28/2014  . Extremity pain 02/28/2014  . Decreased motor strength 02/28/2014  . Non-pressure ulcer of lower extremity ([place]) 02/28/2014  . Loss of feeling or sensation 02/28/2014  . Anterior optic neuritis 02/28/2014  . Hemorrhage, postmenopausal 02/28/2014  . Arthritis of knee, degenerative 02/28/2014  . AS (sickle cell trait) ([place]) 02/28/2014  . Hemiplegia, spastic ([place]) 02/28/2014  . Sickle cell trait ([place]) 02/28/2014  . Spastic hemiplegia ([place]) 02/28/2014  . Abnormal results of thyroid function studies 12/28/2013  . Allergic rhinitis 08/22/2013  . Disorder of magnesium metabolism 08/22/2013  . Essential (primary) hypertension 08/22/2013  . Hypercholesteremia 08/22/2013  . Adult hypothyroidism 08/22/2013  . Anemia, iron deficiency 08/22/2013  . Compulsive tobacco user syndrome 08/22/2013  . Absence of bladder continence 08/22/2013  . Hypercholesterolemia 08/22/2013  . Tobacco abuse 08/22/2013  . Accumulation of fluid in tissues 11/29/2012  . Dermatophytic onychia 10/28/2012  . Colon, diverticulosis 10/28/2012  . Polypharmacy 10/28/2012  . DS (disseminated sclerosis) ([place]) 10/28/2012  . Arthralgia of lower leg 10/28/2012  . Menopausal symptom 10/28/2012  . Current tear of lateral cartilage or meniscus of knee 10/28/2012  . Diverticular disease of large intestine 10/28/2012  . Other long term (current) drug therapy 10/28/2012  . Abnormal uterine bleeding 06/26/2012  . Baseball finger 08/03/2010    Orientation RESPIRATION BLADDER Height & Weight     Self,Place  Normal Incontinent Weight: 76.8 kg Height:  5\' 7"  (  170.2 cm)  BEHAVIORAL SYMPTOMS/MOOD NEUROLOGICAL BOWEL NUTRITION STATUS      Incontinent Diet (Regular)  AMBULATORY STATUS COMMUNICATION OF NEEDS Skin   Total  Care Verbally PU Stage and Appropriate Care,Other (Comment) (iv abx may need invanz 1gm qd)       PU Stage 4 Dressing: BID               Personal Care Assistance Level of Assistance  Bathing,Feeding,Dressing Bathing Assistance: Maximum assistance Feeding assistance: Limited assistance Dressing Assistance: Maximum assistance     Functional Limitations Info  Sight,Hearing,Speech Sight Info: Adequate Hearing Info: Adequate Speech Info: Adequate    SPECIAL CARE FACTORS FREQUENCY  PT (By licensed PT),OT (By licensed OT)     PT Frequency: 5x week OT Frequency: 5x week            Contractures Contractures Info: Not present    Additional Factors Info  Insulin Sliding Scale       Insulin Sliding Scale Info: SSI see MAR       Current Medications (08/22/2020):  This is the current hospital active medication list Current Facility-Administered Medications  Medication Dose Route Frequency Provider Last Rate Last Admin  . (feeding supplement) PROSource Plus liquid 30 mL  30 mL Oral BID BM Hongalgi, Lenis Dickinson, MD   30 mL at 08/22/20 0957  . 0.9 %  sodium chloride infusion   Intravenous PRN Modena Jansky, MD 10 mL/hr at 08/20/20 1815 250 mL at 08/20/20 1815  . acetaminophen (TYLENOL) tablet 650 mg  650 mg Oral Q6H PRN Shela Leff, MD   650 mg at 08/21/20 6606   Or  . acetaminophen (TYLENOL) suppository 650 mg  650 mg Rectal Q6H PRN Shela Leff, MD      . ascorbic acid (VITAMIN C) tablet 500 mg  500 mg Oral Daily Elodia Florence., MD   500 mg at 08/22/20 0958  . Chlorhexidine Gluconate Cloth 2 % PADS 6 each  6 each Topical Daily Elodia Florence., MD   6 each at 08/21/20 [phone]  . dalfampridine TB12 10 mg  10 mg Oral BID Elodia Florence., MD      . ertapenem [facility]) 1,000 mg in sodium chloride 0.9 % 100 mL IVPB  1 g Intravenous Daily Vu, Trung T, MD 200 mL/hr at 08/22/20 1011 1,000 mg at 08/22/20 1011  . feeding supplement (BOOST / RESOURCE BREEZE)  liquid 1 Container  1 Container Oral BID BM Modena Jansky, MD   1 Container at 08/22/20 1153  . insulin aspart (novoLOG) injection 0-5 Units  0-5 Units Subcutaneous QHS Shela Leff, MD      . insulin aspart (novoLOG) injection 0-9 Units  0-9 Units Subcutaneous TID WC Shela Leff, MD   1 Units at 08/21/20 1233  . metoprolol succinate (TOPROL-XL) 24 hr tablet 100 mg  100 mg Oral Daily Patrecia Pour, MD   100 mg at 08/22/20 0958  . mometasone-formoterol (DULERA) 100-5 MCG/ACT inhaler 2 puff  2 puff Inhalation BID Elodia Florence., MD   2 puff at 08/22/20 0740  . multivitamin with minerals tablet 1 tablet  1 tablet Oral Daily Modena Jansky, MD   1 tablet at 08/22/20 [phone]  . nicotine (NICODERM CQ - dosed in mg/24 hours) patch 21 mg  21 mg Transdermal Daily Shela Leff, MD   21 mg at 08/17/20 1001  . nutrition supplement (JUVEN) (JUVEN) powder packet 1 packet  1 packet Oral BID BM  Elodia Florence., MD   1 packet at 08/20/20 [phone]  . nystatin (MYCOSTATIN) 100000 UNIT/ML suspension 500,000 Units  5 mL Oral QID Carlyle Basques, MD   500,000 Units at 08/22/20 [phone]  . oxybutynin (DITROPAN-XL) 24 hr tablet 10 mg  10 mg Oral Daily Elodia Florence., MD   10 mg at 08/22/20 [phone]  . oxyCODONE (Oxy IR/ROXICODONE) immediate release tablet 5 mg  5 mg Oral Q6H PRN Elodia Florence., MD   5 mg at 08/22/20 0959  . pantoprazole (PROTONIX) EC tablet 40 mg  40 mg Oral BID Modena Jansky, MD   40 mg at 08/22/20 0959  . polyethylene glycol (MIRALAX / GLYCOLAX) packet 17 g  17 g Oral Daily Hongalgi, Anand D, MD      . senna-docusate (Senokot-S) tablet 1 tablet  1 tablet Oral QHS Modena Jansky, MD   1 tablet at 08/21/20 2220  . zinc sulfate capsule 220 mg  220 mg Oral Daily Elodia Florence., MD   220 mg at 08/22/20 1583     Discharge Medications: Please see discharge summary for a list of discharge medications.  Relevant Imaging Results:  Relevant Lab  Results:   Additional Information SS# [id number]; pt has had COVID vaccination but not booster  [place] Stanislaw, Juliann Pulse, RN

## 2020-08-22 NOTE — Progress Notes (Signed)
[facility] for Infectious Disease  Date of Admission:  08/11/2020     CC: sepsis, sacral decubitus ulcer infection  Lines: Peripheral iv's  Abx: 3/31-c ertapenem  3/26-3/31 meropenem  3/24-26 doxy 3/24-26 amp-sulb 3/21-24 cefepime 3/21-24 vanc 3/21-24 metronidazole  ASSESSMENT: Leukocytosis Sacral decub ulcer associated chronic OM Hx covid infection 04/2020 Polypharmacy DM2 Hx MS/CVA with left hemiplegia; hx left optic neuritis Functionally debilitated Hx thyroid goiter Sickle Cell trait  60 yo female chronically debilitated, hx ms/cva left hemiplegia, covid pna 04/2020, recent admission 06/16/20 for sepsis in setting stage IV sacral decub ulcer (s/p I&D 1/27; bcx bacteroides; treated with 2 weeks abx until 2/09 dapto/erta --> doxy/augmentin), readmitted from nursing home 3/21 for family report worsening sacral wound in setting of it often being soiled, meeting sepsis criteria. Of note, does noted to have tarry stools this admission concerning for GIB  #chronic sacral decub chronic sacral pain with onset of ulcer since 05/2020 stable Had I&D previous admission 05/2020 but no wound cx sent Rising wbc/platelet until meropenem started (was on doxy/augmentin, cefepime at times previously) Frequent soiling of the ulcer. Surgery reviewed and not a candidate for colostomy crp improving as well with meropenem I discussed with her given her current status being bedbound and unlikely the ulcer is closing, I am very hesitant to treat her for full 6 weeks of abx, especially now we need to escalate to higher risk abx such as carbapenem   -I think it is reasonable to do 6 weeks tx chronic sacral osteomyelitis this time, but if she is readmitted after the treatment for recurrent sacral decub associated OM, would only do 2 week tx at a time for soft tissue infection/abscess, if congruent with goals of care 4/21 for 6 weeks including other previous abx -once discharge see id  f/u below  #fuo #leukocytosis/thrombocytosis It appears she had leukocytosis since the dx of sacral ulcer. Although of note she had covid just prior, and unclear if the leukocytosis/fever is related to long covid Extensive w/u negative so far, including repeat bcx, chest/abd/pelv ct, tte 3/30 no vegetation. No s/s of rheum/hematologic process The only thing somewhat suggestive if the bilateral upper lobe scarring and indeterminate quant gold. I don't make much of this for now, but will see what the quant gold shows. She has no respiratory sx  -would like to complete FUO w/u. A pet scan or tagged wbc scan if can be done outpatient would be helpful -clinically well otherwise, I don't see need to keep inpatient from id standpoint if ready to discharge per primary team -fever might be limiting to disposition -f/u endemic fungi/brucella/qfever serology -f/u quant gold   #bilateral upper lobe scarring #hx indeterminate quantiferon gold As above suggested, very low suspicion of active tb If quant-gold repeat is positive, could consider treating for latent tb as she is a nursing home resident/advance age  -consider repeat chest imaging in future to document stability of upper lobe changes -f/u quantiferon gold      ------------ opat arrangement Diagnosis: Chronic sacral decub om Culture Result: none OPAT Order Details            .Outpatient Parenteral Antibiotic Therapy Consult  Until discontinued       Provider:  (Not yet assigned)  Question Answer Comment  Antibiotic Ertapenem (Invanz) IVPB   Indications for use Sacral decubitus ulcer/osteo   End Date 09/11/2020  [facility] Care Per Protocol: Home health RN for IV administration and teaching; PICC line care and labs.   Labs weekly while on IV antibiotics: _x_ CBC with differential __ BMP _x_ CMP _x_ CRP __ ESR __ Vancomycin trough __ CK  _x_ Please pull PIC at completion of IV antibiotics __ Please leave PIC  in place until doctor has seen patient or been notified  Fax weekly labs to [phone]  Clinic Follow Up Appt: 4/27 @ 330 with Dr West Bali  @  [facility] [place], [place], [place] [address] Phone: [phone]    I spent more than 35 minute reviewing data/chart, and coordinating care and >50% direct face to face time providing counseling/discussing diagnostics/treatment plan with patient  ---------------------   Principal Problem:   Sepsis ([place]) Active Problems:   Constipation   Tobacco abuse   Anemia   FUO (fever of unknown origin)   AMS (altered mental status)   Diabetes ([place])   Fecal impaction (H[facility])   Dark stools   Sacral osteomyelitis ([place])   Scheduled Meds: . (feeding supplement) PROSource Plus  30 mL Oral BID BM  . vitamin C  500 mg Oral Daily  . Chlorhexidine Gluconate Cloth  6 each Topical Daily  . dalfampridine  10 mg Oral BID  . feeding supplement  1 Container Oral BID BM  . insulin aspart  0-5 Units Subcutaneous QHS  . insulin aspart  0-9 Units Subcutaneous TID WC  . metoprolol succinate  100 mg Oral Daily  . mometasone-formoterol  2 puff Inhalation BID  . multivitamin with minerals  1 tablet Oral Daily  . nicotine  21 mg Transdermal Daily  . nutrition supplement (JUVEN)  1 packet Oral BID BM  . nystatin  5 mL Oral QID  . oxybutynin  10 mg Oral Daily  . pantoprazole  40 mg Oral BID  . polyethylene glycol  17 g Oral Daily  . senna-docusate  1 tablet Oral QHS  . zinc sulfate  220 mg Oral Daily   Continuous Infusions: . sodium chloride 250 mL (08/20/20 1815)  . ertapenem 1,000 mg (08/22/20 1011)   PRN Meds:.sodium chloride, acetaminophen **OR** acetaminophen, oxyCODONE   SUBJECTIVE: Afebrile last 24 hours Transitioned meropenem to ertapenem Pet scan not approved for inpatient Ordered tagged wbc scan and ?no access currently not being done  Wbc moderately high stable high teens; thrombocytosis improving Patient clinically  appearing stable and sacral ulcer process per nursing staff not worse in terms of cellulitic chagne/discharge  No cough/chest pain, n/v/abd pain Review of Systems: ROS All other ROS negative, except above  Allergies  Allergen Reactions  . No Known Allergies     OBJECTIVE: Vitals:   08/22/20 0300 08/22/20 0614 08/22/20 0740 08/22/20 0953  BP:  117/72  129/70  Pulse:  (!) 119  (!) 116  Resp:  20  16  Temp:  97.8 F (36.6 C)  98.2 F (36.8 C)  TempSrc:    Oral  SpO2: 96% 100% 99% 99%  Weight:      Height:       Body mass index is 26.52 kg/m.  Physical Exam  General/constitutional: no distress, pleasant HEENT: Normocephalic, PER, Conj Clear, EOMI, Oropharynx clear Neck supple CV: rrr no mrg Lungs: clear to auscultation, normal respiratory effort Abd: Soft, Nontender Ext: no edema Skin no rash; deferred sacral decub exam (discussed with nursing staff) Neuro: left hemiplegia MSK: stiff bilateral LE L>R; no peripheral joint swelling/tenderness/warmth; back spines nontender  Central line presence: no   Central line presence: no   Lab Results Lab Results  Component Value Date   WBC 17.6 (H) 08/22/2020   HGB 9.0 (L) 08/22/2020   HCT 27.6 (L) 08/22/2020   MCV 74.4 (L) 08/22/2020   PLT 622 (H) 08/22/2020    Lab Results  Component Value Date   CREATININE 0.38 (L) 08/22/2020   BUN 5 (L) 08/22/2020   NA 133 (L) 08/22/2020   K 4.2 08/22/2020   CL 102 08/22/2020   CO2 23 08/22/2020    Lab Results  Component Value Date   ALT 16 08/20/2020   AST 13 (L) 08/20/2020   ALKPHOS 99 08/20/2020   BILITOT 0.7 08/20/2020     Microbiology: Recent Results (from the past 240 hour(s))  Culture, blood (routine x 2)     Status: None   Collection Time: 08/15/20  3:20 PM   Specimen: BLOOD RIGHT HAND  Result Value Ref Range Status   Specimen Description   Final    BLOOD RIGHT HAND Performed at [facility], [place] [address] [place], [place] [address]     Special Requests   Final    BOTTLES DRAWN AEROBIC ONLY Blood Culture adequate volume Performed at [place] [address] [place], [place] [address]    Culture   Final    NO GROWTH 5 DAYS Performed at [facility], [place] [address] [place], [place] [address]    Report Status 08/20/2020 FINAL  Final  Culture, blood (routine x 2)     Status: None   Collection Time: 08/15/20  3:20 PM   Specimen: BLOOD LEFT HAND  Result Value Ref Range Status   Specimen Description   Final    BLOOD LEFT HAND Performed at [facility] [address], [place], [place] [address]    Special Requests   Final    BOTTLES DRAWN AEROBIC ONLY Blood Culture adequate volume Performed at [facility] [address] [place], [place] [address]    Culture   Final    NO GROWTH 5 DAYS Performed at [facility], [place] [address] [place], [place] [address]    Report Status 08/20/2020 FINAL  Final    Serology:  Imaging: If present, new imagings (plain films, ct scans, and mri) have been personally visualized and interpreted; radiology reports have been reviewed. Decision making incorporated into the Impression / Recommendations.  1/26 tte 1. Abnormal septal motion . Left ventricular ejection fraction, by  estimation, is 60 to 65%. The left ventricle has normal function. The left  ventricle has no regional wall motion abnormalities. There is mild left  ventricular hypertrophy. Left  ventricular diastolic parameters were normal.  2. Right ventricular systolic function is mildly reduced. The right  ventricular size is mildly enlarged. There is mildly elevated pulmonary  artery systolic pressure.  3. The mitral valve is normal in structure. Trivial mitral valve  regurgitation. No evidence of mitral stenosis.  4. The aortic valve is tricuspid. Aortic valve regurgitation is not  visualized. No aortic stenosis is present.  5. The  inferior vena cava is normal in size with greater than 50%  respiratory variability, suggesting right atrial pressure of 3 mmHg.   3/21 abd pelv ct with contrast Large sacral decubitus ulceration with cortical irregularity at the inferior coccyx which could be due to chronic osteomyelitis as on prior exam. No loculated fluid collections or evidence of acute osteomyelitis  Large amount of colonic stool with a large fecal ball within the Rectum.  3/22 bilateral LE duplex u/s No dvt's  3/27 chest cta No evidence of pulmonary embolism. No acute intrathoracic abnormalities. Emphysematous changes with BILATERAL upper lobe scarring greater on RIGHT. RIGHT thyroid mass 2.8 x 3.5 cm; recommend thyroid ultrasound assessment.  3/28 duplex ext no dvt's  3/30 TTE 1. Left ventricular ejection fraction, by estimation, is 60 to 65%. The  left ventricle has normal function. The left ventricle has no regional  wall motion abnormalities.  2. Right ventricular systolic function is normal. The right ventricular  size is normal. There is normal pulmonary artery systolic pressure.  3. The mitral valve is normal in structure. Trivial mitral valve  regurgitation. No evidence of mitral stenosis.  4. The aortic valve is tricuspid. Aortic valve regurgitation is not  visualized. No aortic stenosis is present.  5. The inferior vena cava is normal in size with greater than 50%  respiratory variability, suggesting right atrial pressure of 3 mmHg.   Jabier Mutton, [place] [facility] [place] [phone] pager    08/22/2020, 4:15 PM

## 2020-08-22 NOTE — Consult Note (Signed)
Consultation Note Date: 08/22/2020   Patient Name: Tina Griffith  DOB: 06-Jan-1961  MRN: [id number]  Age / Sex: 60 y.o., female  PCP: [name] Referring Physician: Allie Bossier, MD  Reason for Consultation: Establishing goals of care  HPI/Patient Profile: 60 y.o. female  with past medical history of   admitted on 08/11/2020 with  .   Clinical Assessment and Goals of Care:  60 year old lady with a extensive past medical history of multiple sclerosis diabetes hypertension, history of COVID-19 pneumonia in December 2021 with resultant bedbound status complicated by sacral decubital ulcer which became infected prompting admission in January 2022 after which patient was discharged to SNF, admitted from SNF due to fever malodorous discharge from sacral wound altered mental status.  Patient was admitted to hospital medicine service for sepsis, infected sacral decubitus ulcer and chronic osteomyelitis.  Infectious disease has been consulted.  Recommendations for antibiotics have been made.  Because of the patient's acute and chronic serious illnesses, palliative consultation for goals of care discussions has been requested.  Patient is awake alert resting in bed.  She is on the phone talking to her sister and daughter.  Patient states that she is resting okay sometimes she has episodes of pain at the site of her wound.  Patient wished for me to give information to her sister and daughter over the phone.  I introduced myself and palliative care as follows:  Palliative medicine is specialized medical care for people living with serious illness. It focuses on providing relief from the symptoms and stress of a serious illness. The goal is to improve quality of life for both the patient and the family.  Goals of care: Broad aims of medical therapy in relation to the patient's values and preferences. Our aim is  to provide medical care aimed at enabling patients to achieve the goals that matter most to them, given the circumstances of their particular medical situation and their constraints.   Brief life review performed.  Patient is described as a very strong lady.  Her words always have been to never give up.  That is her life philosophy as described by her loved ones.  They describe her as someone who has been through a lot.  Family states that they have noticed her decline since she had Covid.  I discussed with the patient that she had made advance care planning documents a living will and healthcare power of attorney agent.  I discussed with her that in the living will she makes preferences for allowing natural death, not having artificial life support, not having artificial nutrition and hydration.  Patient clearly tells me that she does not wish to follow the previously prepared living will.  Patient states that she still wishes to keep the healthcare power of attorney paperwork designating her daughter Tina Griffith as her healthcare power of attorney agent.  She states that she would want her daughter to make all decisions for her and that she would go by what ever her daughter decides.  We  set up a meeting face-to-face however I received a call from Ms. Tina Griffith stating that she just got out of the hospital herself and wishes to focus on her health hence, we did a phone family meeting.  We reviewed again about the patient's condition.  We discussed extensively about the serious nature of infected sacral decubitus ulcer with chronic osteomyelitis.  Shared with her frankly but compassionately regarding the health care team being concerned that this may never heal fully and that the patient faces increasing burden from recurrent hospitalizations.  Daughter states that she is aware however she wishes to continue with full code/full CODE STATUS, continued antibiotic and other therapies to give her mother a fighting chance  and she states that her mother always told her to never give up.  See recommendations as listed below.  Thank you for the consult.  HCPOA Daughter Tina Griffith at [phone].  SUMMARY OF RECOMMENDATIONS   Full code/full scope care Continue IV antibiotics and wound care and any and all interventions available for patient's current medical conditions. Recommend skilled nursing facility with palliative support towards the end of this hospitalization. Thank you for the consult  Code Status/Advance Care Planning:  Full code    Symptom Management:      Palliative Prophylaxis:   Delirium Protocol  Additional Recommendations (Limitations, Scope, Preferences):  Full Scope Treatment  Psycho-social/Spiritual:   Desire for further Chaplaincy support:yes  Additional Recommendations: Caregiving  Support/Resources  Prognosis:   Unable to determine  Discharge Planning: [facility] for rehab with Palliative care service follow-up      Primary Diagnoses: Present on Admission: . Sepsis ([place]) . Constipation   I have reviewed the medical record, interviewed the patient and family, and examined the patient. The following aspects are pertinent.  Past Medical History:  Diagnosis Date  . Constipation   . Hypertension   . Movement disorder   . MS (multiple sclerosis) ([place])   . Vision abnormalities    Social History   Socioeconomic History  . Marital status: Single    Spouse name: Not on file  . Number of children: Not on file  . Years of education: Not on file  . Highest education level: Not on file  Occupational History  . Not on file  Tobacco Use  . Smoking status: Current Every Day Smoker    Packs/day: 1.00    Types: Cigarettes  . Smokeless tobacco: Never Used  Substance and Sexual Activity  . Alcohol use: No    Alcohol/week: 0.0 standard drinks  . Drug use: No  . Sexual activity: Not on file  Other Topics Concern  . Not on file  Social  History Narrative  . Not on file   Social Determinants of Health   Financial Resource Strain: Not on file  Food Insecurity: Not on file  Transportation Needs: Not on file  Physical Activity: Not on file  Stress: Not on file  Social Connections: Not on file   Family History  Problem Relation Age of Onset  . Lung cancer Mother   . Stroke Mother   . Diabetes Father    Scheduled Meds: . (feeding supplement) PROSource Plus  30 mL Oral BID BM  . vitamin C  500 mg Oral Daily  . Chlorhexidine Gluconate Cloth  6 each Topical Daily  . dalfampridine  10 mg Oral BID  . feeding supplement  1 Container Oral BID BM  . insulin aspart  0-5 Units Subcutaneous QHS  . insulin aspart  0-9  Units Subcutaneous TID WC  . metoprolol succinate  100 mg Oral Daily  . mometasone-formoterol  2 puff Inhalation BID  . multivitamin with minerals  1 tablet Oral Daily  . nicotine  21 mg Transdermal Daily  . nutrition supplement (JUVEN)  1 packet Oral BID BM  . nystatin  5 mL Oral QID  . oxybutynin  10 mg Oral Daily  . pantoprazole  40 mg Oral BID  . polyethylene glycol  17 g Oral Daily  . senna-docusate  1 tablet Oral QHS  . zinc sulfate  220 mg Oral Daily   Continuous Infusions: . sodium chloride 250 mL (08/20/20 1815)  . ertapenem 1,000 mg (08/22/20 1011)   PRN Meds:.sodium chloride, acetaminophen **OR** acetaminophen, oxyCODONE Medications Prior to Admission:  Prior to Admission medications   Medication Sig Start Date End Date Taking? Authorizing Provider  acetaminophen (TYLENOL) 325 MG tablet Take 650 mg by mouth every 6 (six) hours as needed for fever (100.4 and above).   Yes [provider]  Ascorbic Acid (VITAMIN C) 1000 MG tablet Take 1,000 mg by mouth in the morning and at bedtime. 05/21/20  Yes [provider]  ASPIRIN LOW DOSE 81 MG EC tablet Take 81 mg by mouth daily. 06/10/20  Yes [provider]  benzonatate (TESSALON) 100 MG capsule Take 100 mg by mouth 3 (three)  times daily. 05/18/20  Yes [provider]  budesonide-formoterol (SYMBICORT) 80-4.5 MCG/ACT inhaler Inhale 2 puffs into the lungs daily. 05/21/20  Yes [provider]  CALMOSEPTINE 0.44-20.6 % OINT Apply 1 application topically 3 (three) times daily as needed (skin irritation). 06/10/20  Yes [provider]  dalfampridine 10 MG TB12 Take 1 tablet (10 mg total) by mouth 2 (two) times daily. 08/25/16  Yes Sater, Nanine Means, MD  Dulaglutide (TRULICITY) 1.5 VX/7.9TJ SOPN Inject 1.5 mg into the skin every Friday.   Yes [provider]  FEROSUL 325 (65 Fe) MG tablet Take 325 mg by mouth 2 (two) times daily. 05/18/20  Yes [provider]  metoprolol succinate (TOPROL-XL) 100 MG 24 hr tablet Take 100 mg by mouth daily. 05/19/20  Yes [provider]  Multiple Vitamin (MULTIVITAMIN ADULT) TABS Take 1 tablet by mouth daily.   Yes [provider]  naloxegol oxalate (MOVANTIK) 25 MG TABS tablet Take 25 mg by mouth daily.   Yes [provider]  Naloxone HCl 0.4 MG/0.4ML SOAJ Use as directed if unable to arouse the patient 01/16/16  Yes Sater, Nanine Means, MD  nutrition supplement, JUVEN, (JUVEN) PACK Take 1 packet by mouth 2 (two) times daily between meals. Mix with 120 ml of water or juice   Yes [provider]  Nutritional Supplements (ENSURE PO) Take 120 mLs by mouth See admin instructions. 3 times daily with med pass   Yes [provider]  oxybutynin (DITROPAN-XL) 10 MG 24 hr tablet Take 10 mg by mouth daily. 06/11/20  Yes [provider]  Oxycodone HCl 10 MG TABS Take 10 mg by mouth daily. For pain/ or before wound care   Yes [provider]  oxyCODONE-acetaminophen (PERCOCET) 10-325 MG tablet Take 1 tablet by mouth every 4 (four) hours as needed for pain.   Yes [provider]  potassium chloride (MICRO-K) 10 MEQ CR capsule Take 10 mEq by mouth 2 (two) times daily. 05/19/20  Yes [provider]  Protein POWD Take by mouth 2 (two) times daily.   Yes [provider]  tiZANidine (ZANAFLEX) 4  MG tablet TAKE TWO (2) TABLETS THREE (3) TIMES DAILY Patient taking differently: Take 8 mg by mouth 3 (three) times daily. 03/05/16  Yes Sater, Nanine Means, MD  zinc gluconate 50 MG tablet Take 50 mg by mouth daily. 05/21/20  Yes [provider]   Allergies  Allergen Reactions  . No Known Allergies    Review of Systems + pain.   Physical Exam Wound care notes reviewed.  Patient is noted to have stage IV full-thickness tissue loss with exposed bone tendon muscle wound on her buttocks. Elderly lady resting in bed Appears to have chronic generalized weakness, appears chronically ill Regular work of breathing Awake alert S 1 S 2   Vital Signs: BP 129/70 (BP Location: Right Arm)   Pulse (!) 116   Temp 98.2 F (36.8 C) (Oral)   Resp 16   Ht 5\' 7"  (1.702 m)   Wt 76.8 kg   SpO2 99%   BMI 26.52 kg/m  Pain Scale: 0-10 POSS *See Group Information*: 1-Acceptable,Awake and alert Pain Score: Asleep   SpO2: SpO2: 99 % O2 Device:SpO2: 99 % O2 Flow Rate: .O2 Flow Rate (L/min): 2 L/min  IO: Intake/output summary:   Intake/Output Summary (Last 24 hours) at 08/22/2020 1557 Last data filed at 08/22/2020 0900 Gross per 24 hour  Intake 374.8 ml  Output 1675 ml  Net -1300.2 ml    LBM: Last BM Date: 08/21/20 Baseline Weight: Weight: 76.8 kg Most recent weight: Weight: 76.8 kg     Palliative Assessment/Data:   PPS 40%  Time In:  1400  Time Out:  1510 Time Total:  70 min.  Greater than 50%  of this time was spent counseling and coordinating care related to the above assessment and plan.  Signed by: Loistine Chance, MD   Please contact Palliative Medicine Team phone at [phone] for questions and concerns.  For individual provider: See Shea Evans

## 2020-08-22 NOTE — Progress Notes (Signed)
Peripherally Inserted Central Catheter Placement  The IV Nurse has discussed with the patient and/or persons authorized to consent for the patient, the purpose of this procedure and the potential benefits and risks involved with this procedure.  The benefits include less needle sticks, lab draws from the catheter, and the patient may be discharged home with the catheter. Risks include, but not limited to, infection, bleeding, blood clot (thrombus formation), and puncture of an artery; nerve damage and irregular heartbeat and possibility to perform a PICC exchange if needed/ordered by physician.  Alternatives to this procedure were also discussed.  Bard Power PICC patient education guide, fact sheet on infection prevention and patient information card has been provided to patient /or left at bedside. Consent obtained via telephone from daughter Tina Griffith) with second verify by Joseph Art, RN  PICC Placement Documentation  PICC Single Lumen 08/22/20 PICC Right Brachial 38 cm 1 cm (Active)  Indication for Insertion or Continuance of Line Home intravenous therapies (PICC only) 08/22/20 1842  Exposed Catheter (cm) 1 cm 08/22/20 1842  Site Assessment Clean;Dry;Intact 08/22/20 1842  Line Status Flushed;Saline locked;Blood return noted 08/22/20 1842  Dressing Type Transparent 08/22/20 1842  Dressing Status Clean;Dry;Intact 08/22/20 1842  Antimicrobial disc in place? Yes 08/22/20 1842  Safety Lock Not Applicable 34/91/79 1505  Line Care Connections checked and tightened 08/22/20 1842  Dressing Intervention New dressing 08/22/20 1842  Dressing Change Due 08/29/20 08/22/20 [name] 08/22/2020, 6:45 PM

## 2020-08-23 DIAGNOSIS — A419 Sepsis, unspecified organism: Secondary | ICD-10-CM | POA: Diagnosis not present

## 2020-08-23 DIAGNOSIS — I10 Essential (primary) hypertension: Secondary | ICD-10-CM

## 2020-08-23 DIAGNOSIS — G35 Multiple sclerosis: Secondary | ICD-10-CM

## 2020-08-23 DIAGNOSIS — M4628 Osteomyelitis of vertebra, sacral and sacrococcygeal region: Secondary | ICD-10-CM | POA: Diagnosis not present

## 2020-08-23 DIAGNOSIS — D75839 Thrombocytosis, unspecified: Secondary | ICD-10-CM | POA: Diagnosis present

## 2020-08-23 DIAGNOSIS — E119 Type 2 diabetes mellitus without complications: Secondary | ICD-10-CM

## 2020-08-23 DIAGNOSIS — G9341 Metabolic encephalopathy: Secondary | ICD-10-CM

## 2020-08-23 DIAGNOSIS — E079 Disorder of thyroid, unspecified: Secondary | ICD-10-CM | POA: Diagnosis present

## 2020-08-23 DIAGNOSIS — E1121 Type 2 diabetes mellitus with diabetic nephropathy: Secondary | ICD-10-CM | POA: Diagnosis not present

## 2020-08-23 DIAGNOSIS — J984 Other disorders of lung: Secondary | ICD-10-CM

## 2020-08-23 DIAGNOSIS — R509 Fever, unspecified: Secondary | ICD-10-CM | POA: Diagnosis not present

## 2020-08-23 DIAGNOSIS — R532 Functional quadriplegia: Secondary | ICD-10-CM | POA: Diagnosis present

## 2020-08-23 LAB — COMPREHENSIVE METABOLIC PANEL
ALT: 28 U/L (ref 0–44)
AST: 21 U/L (ref 15–41)
Albumin: 2.1 g/dL — ABNORMAL LOW (ref 3.5–5.0)
Alkaline Phosphatase: 103 U/L (ref 38–126)
Anion gap: 9 (ref 5–15)
BUN: 7 mg/dL (ref 6–20)
CO2: 24 mmol/L (ref 22–32)
Calcium: 8.8 mg/dL — ABNORMAL LOW (ref 8.9–10.3)
Chloride: 101 mmol/L (ref 98–111)
Creatinine, Ser: 0.43 mg/dL — ABNORMAL LOW (ref 0.44–1.00)
GFR, Estimated: >60 mL/min (ref >60)
Glucose, Bld: 101 mg/dL — ABNORMAL HIGH (ref 70–99)
Potassium: 4.2 mmol/L (ref 3.5–5.1)
Sodium: 134 mmol/L — ABNORMAL LOW (ref 135–145)
Total Bilirubin: 0.4 mg/dL (ref 0.3–1.2)
Total Protein: 5.7 g/dL — ABNORMAL LOW (ref 6.5–8.1)

## 2020-08-23 LAB — GLUCOSE, CAPILLARY
Glucose-Capillary: 169 mg/dL — ABNORMAL HIGH (ref 70–99)
Glucose-Capillary: 108 mg/dL — ABNORMAL HIGH (ref 70–99)
Glucose-Capillary: 103 mg/dL — ABNORMAL HIGH (ref 70–99)
Glucose-Capillary: 116 mg/dL — ABNORMAL HIGH (ref 70–99)

## 2020-08-23 LAB — CBC WITH DIFFERENTIAL/PLATELET
Abs Immature Granulocytes: 0.27 10*3/uL — ABNORMAL HIGH (ref 0.00–0.07)
Basophils Absolute: 0.1 10*3/uL (ref 0.0–0.1)
Basophils Relative: 0 %
Eosinophils Absolute: 0.1 10*3/uL (ref 0.0–0.5)
Eosinophils Relative: 1 %
HCT: 25.6 % — ABNORMAL LOW (ref 36.0–46.0)
Hemoglobin: 8.1 g/dL — ABNORMAL LOW (ref 12.0–15.0)
Immature Granulocytes: 2 %
Lymphocytes Relative: 11 %
Lymphs Abs: 1.9 10*3/uL (ref 0.7–4.0)
MCH: 24.1 pg — ABNORMAL LOW (ref 26.0–34.0)
MCHC: 31.6 g/dL (ref 30.0–36.0)
MCV: 76.2 fL — ABNORMAL LOW (ref 80.0–100.0)
Monocytes Absolute: 1.5 10*3/uL — ABNORMAL HIGH (ref 0.1–1.0)
Monocytes Relative: 9 %
Neutro Abs: 12.5 10*3/uL — ABNORMAL HIGH (ref 1.7–7.7)
Neutrophils Relative %: 77 %
Platelets: 762 10*3/uL — ABNORMAL HIGH (ref 150–400)
RBC: 3.36 MIL/uL — ABNORMAL LOW (ref 3.87–5.11)
RDW: 19.7 % — ABNORMAL HIGH (ref 11.5–15.5)
WBC: 16.2 10*3/uL — ABNORMAL HIGH (ref 4.0–10.5)
nRBC: 0.3 % — ABNORMAL HIGH (ref 0.0–0.2)

## 2020-08-23 LAB — MAGNESIUM: Magnesium: 2.1 mg/dL (ref 1.7–2.4)

## 2020-08-23 LAB — PHOSPHORUS: Phosphorus: 2 mg/dL — ABNORMAL LOW (ref 2.5–4.6)

## 2020-08-23 MED ORDER — POTASSIUM PHOSPHATES 15 MMOLE/5ML IV SOLN
30.0000 mmol | Freq: Once | INTRAVENOUS | Status: AC
  Administered 2020-08-23: 30 mmol via INTRAVENOUS
  Filled 2020-08-23: qty 10

## 2020-08-23 MED ORDER — METOPROLOL SUCCINATE ER 25 MG PO TB24
125.0000 mg | ORAL_TABLET | Freq: Every day | ORAL | Status: DC
  Administered 2020-08-24 – 2020-08-29 (×6): 125 mg via ORAL
  Filled 2020-08-23 (×6): qty 1

## 2020-08-23 MED ORDER — INSULIN GLARGINE 100 UNIT/ML ~~LOC~~ SOLN
5.0000 [IU] | Freq: Every day | SUBCUTANEOUS | Status: DC
  Administered 2020-08-23 – 2020-09-02 (×10): 5 [IU] via SUBCUTANEOUS
  Filled 2020-08-23 (×11): qty 0.05

## 2020-08-23 NOTE — Progress Notes (Signed)
Pts sister Crystal called this RN stating concerns about discharge planning. Patients family member stated that her "sister will not be going to a SNF and it was their understanding that while the patient was receiving her 6 weeks of antibiotics she would remain in the hospital." She requested for the doctor to call her today or tomorrow morning.    Information passed to Dr. Sherral Hammers.

## 2020-08-23 NOTE — Progress Notes (Signed)
Pt ate 10% of her breakfast this morning in addition to her scheduled nutritional supplement. She stated that she did not want to eat any more than that d/t loss of appetite.   Pts sister called and updated per patient request.
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
## 2020-08-23 NOTE — TOC Progression Note (Signed)
Transition of Care [facility]) - Progression Note    Patient Details  Name: Tina Griffith MRN: [id number] Date of Birth: September 07, 1960  Transition of Care [facility]) CM/SW Contact  [name], [place] Phone Number: 08/23/2020, 2:04 PM  Clinical Narrative:   CSW made contact with pt's daughter, Tina Griffith, to update on MD's plan to dc pt to SNF since PICC has been placed. CSW advised daughter that [facility] states no beds available and at this point has not made a bed offer.  CSW advised the daughter that there are no further SNF bed offers aside from the 4 provided previously by [name], RN.  Daughter asked to call me back "later today or tomorrow" because today was not a good day for her. CSW stressed to daughter that once MD orders dc, a SNF bed selection will need to be made.   Shortly thereafter, a phone call was received from pt's sister, Tina Griffith, "the matriarch of the family" voicing disagreement with the plan for dc and with the SNF options.  Family states they had a bad experience at last SNF and do not want to be pushed.  "My sister will not go to any of the 4 facilities that have offered".  Pt's sister continued to voice frustration, anger and threats to call "[employer], [employer]" and other...Marland KitchenMarland KitchenMarland Kitchen Pt's sister implied that their understanding was that pt would remain in hospital for the IV ABX 6 week period- CSW attempted to explain the PICC placement was ordered for the IV treatment to be provided at [facility].  CSW offered support, empathy and validation of their advocating for family.   CSW offered to refax the FL2 to seek further offers and to make MD aware of their concerns related to dc plans.   CSW spoke with MD to update and request outreach to family today to offer updates and to further discuss ABX treatment plan. MD advises multiple conversations have been had with the family (as a courtesy given pt is alert and oriented) and he will call them again.     Expected Discharge Plan: [place] Barriers to Discharge: Continued Medical Work up  Expected Discharge Plan and Services Expected Discharge Plan: [place]   Discharge Planning Services: CM Consult   Living arrangements for the past 2 months: [place]                                       Social Determinants of Health (SDOH) Interventions    Readmission Risk Interventions Readmission Risk Prevention Plan 06/18/2020  Transportation Screening Complete  PCP or Specialist Appt within 5-7 Days Complete  Home Care Screening Complete  Medication Review (RN CM) Complete  Some recent data might be hidden

## 2020-08-23 NOTE — Progress Notes (Signed)
Nuclear medicine tech was called about the possibility of having NM scan completed today or early tomorrow for anticipated discharge. NM tech informed writer that scan would be not able to be completed until Monday. Information given to MD Sherral Hammers.

## 2020-08-24 DIAGNOSIS — A419 Sepsis, unspecified organism: Secondary | ICD-10-CM | POA: Diagnosis not present

## 2020-08-24 DIAGNOSIS — R509 Fever, unspecified: Secondary | ICD-10-CM | POA: Diagnosis not present

## 2020-08-24 DIAGNOSIS — E1121 Type 2 diabetes mellitus with diabetic nephropathy: Secondary | ICD-10-CM | POA: Diagnosis not present

## 2020-08-24 DIAGNOSIS — M4628 Osteomyelitis of vertebra, sacral and sacrococcygeal region: Secondary | ICD-10-CM | POA: Diagnosis not present

## 2020-08-24 LAB — CBC WITH DIFFERENTIAL/PLATELET
Abs Immature Granulocytes: 0.23 10*3/uL — ABNORMAL HIGH (ref 0.00–0.07)
Basophils Absolute: 0.1 10*3/uL (ref 0.0–0.1)
Basophils Relative: 1 %
Eosinophils Absolute: 0.1 10*3/uL (ref 0.0–0.5)
Eosinophils Relative: 1 %
HCT: 25.8 % — ABNORMAL LOW (ref 36.0–46.0)
Hemoglobin: 7.9 g/dL — ABNORMAL LOW (ref 12.0–15.0)
Immature Granulocytes: 2 %
Lymphocytes Relative: 16 %
Lymphs Abs: 2.2 10*3/uL (ref 0.7–4.0)
MCH: 23.7 pg — ABNORMAL LOW (ref 26.0–34.0)
MCHC: 30.6 g/dL (ref 30.0–36.0)
MCV: 77.2 fL — ABNORMAL LOW (ref 80.0–100.0)
Monocytes Absolute: 1.3 10*3/uL — ABNORMAL HIGH (ref 0.1–1.0)
Monocytes Relative: 10 %
Neutro Abs: 9.4 10*3/uL — ABNORMAL HIGH (ref 1.7–7.7)
Neutrophils Relative %: 70 %
Platelets: 730 10*3/uL — ABNORMAL HIGH (ref 150–400)
RBC: 3.34 MIL/uL — ABNORMAL LOW (ref 3.87–5.11)
RDW: 20 % — ABNORMAL HIGH (ref 11.5–15.5)
WBC: 13.2 10*3/uL — ABNORMAL HIGH (ref 4.0–10.5)
nRBC: 0.2 % (ref 0.0–0.2)

## 2020-08-24 LAB — COMPREHENSIVE METABOLIC PANEL
ALT: 28 U/L (ref 0–44)
AST: 16 U/L (ref 15–41)
Albumin: 2.2 g/dL — ABNORMAL LOW (ref 3.5–5.0)
Alkaline Phosphatase: 112 U/L (ref 38–126)
Anion gap: 10 (ref 5–15)
BUN: 7 mg/dL (ref 6–20)
CO2: 24 mmol/L (ref 22–32)
Calcium: 9.1 mg/dL (ref 8.9–10.3)
Chloride: 100 mmol/L (ref 98–111)
Creatinine, Ser: 0.36 mg/dL — ABNORMAL LOW (ref 0.44–1.00)
GFR, Estimated: >60 mL/min (ref >60)
Glucose, Bld: 95 mg/dL (ref 70–99)
Potassium: 4 mmol/L (ref 3.5–5.1)
Sodium: 134 mmol/L — ABNORMAL LOW (ref 135–145)
Total Bilirubin: 0.4 mg/dL (ref 0.3–1.2)
Total Protein: 5.8 g/dL — ABNORMAL LOW (ref 6.5–8.1)

## 2020-08-24 LAB — GLUCOSE, CAPILLARY
Glucose-Capillary: 93 mg/dL (ref 70–99)
Glucose-Capillary: 92 mg/dL (ref 70–99)
Glucose-Capillary: 112 mg/dL — ABNORMAL HIGH (ref 70–99)
Glucose-Capillary: 138 mg/dL — ABNORMAL HIGH (ref 70–99)

## 2020-08-24 LAB — MAGNESIUM: Magnesium: 1.8 mg/dL (ref 1.7–2.4)

## 2020-08-24 LAB — PHOSPHORUS: Phosphorus: 3.4 mg/dL (ref 2.5–4.6)

## 2020-08-24 NOTE — Progress Notes (Signed)
PROGRESS NOTE    Tina Griffith  GEX:[id number] DOB: [birth year] DOA: 08/11/2020 PCP: [facility]     Brief Narrative:  60 year old BF PMHx multiple sclerosis, type II DM, hypertension, IBS-C, s/p COVID-19 pneumonia in December 2021 with resultant bedbound status complicated by sacral decubitus ulcer which became infected prompting admission 06/16/2020 and debridement 06/19/2020.  Ultimately she was discharged to [facility] 07/07/2020   Presented to the ED 08/11/2020 from SNF due to family report of fever, malodorous drainage from the sacral wound, altered mental status and neglect at the SNF.  She was admitted for sepsis due to infected sacral decubitus ulcer with chronic osteomyelitis.  ID consulted and assisting with management.  Ongoing fevers.  ID changed antibiotics to ertapenem.  Indicate difficult case, recommend palliative care consult for [place].  Requested PET scan.    Subjective: 4/3 afebrile overnight, A/O x4, states the same as yesterday pain in her sacral region.  States ate a little bit today (when you look on her tray very minimal amount consumed).   Assessment & Plan: Covid vaccination;   Principal Problem:   Sepsis ([place]) Active Problems:   Multiple sclerosis ([place])   Constipation   Decubital ulcer   Essential (primary) hypertension   Tobacco abuse   Anemia   FUO (fever of unknown origin)   Impaction of colon ([place])   AMS (altered mental status)   Diabetes ([place])   Fecal impaction (HCC)   Dark stools   Sacral osteomyelitis (HCC)   Thrombocytosis, unspecified   Thyroid mass   Scarring of lung   Acute metabolic encephalopathy   Diabetes type 2, controlled ([place])   Functional quadriplegia (HCC)   Sepsis due to infected sacral decubitus ulcer and chronic osteomyelitis/FUO -Prior admission 05/2020 had I&D but no wound cultures were sent.  Sepsis resolved with I&D and empiric daptomycin/ertapenem >doxycycline/Augmentin. -CT A/P 3/21: Large sacral decubitus  ulceration with findings suggestive of chronic osteomyelitis.  No fluid collections or acute osteomyelitis findings. -CTA chest 3/27: No PE or acute intrathoracic abnormalities.  Emphysematous changes with bilateral upper lobe scarring greater on the right. -2D echo 3/30: LVEF 60-65%.  No vegetations seen. -Bilateral upper and lower extremity venous Dopplers 3/28: No DVT.  No SVT in lower extremities -Blood cultures x2, 08/15/2020 and urine culture 3/22: Negative. -General surgery consulted and no plan for diversion at this point. -Treated initially with IV cefepime, vancomycin and metronidazole 3/21-3/24. -Due to intermittent fevers and persistent leukocytosis, ID changed to doxycycline and Unasyn 3/24-3/26 Still had ongoing intermittent fevers up to 101.4, persistent leukocytosis, ID then changed to IV meropenem 3/26 >. -ID do not suspect drug fever, hematologic or rheumatological process.  -Had recurrent fever of 102.1 early this morning.  WBC 15.6. Unclear as to source of intermittent asymptomatic fever.  ID requested a PET scan to help with diagnosis, if unable to obtain PET then obtain NM WBC scan tumor LOC scan.  - negative then would chock up intermittent fevers too long COVID.   -ID also sent endemic fungi/qfever/brucella/bartonella serology to complete FUO w/u but have low suspicion for any of these.  These can be followed up as outpatient -Unlikely that the large ulcer will heal in the context of bedbound status, poor nutritional status, stool soiling etc.  Does have a Foley catheter to minimize urinary swelling.  ID recommends palliative care consult for goals-agree and requested. -4/1 PICC line pending.  Spoke with Dr.Vu infectious disease who stated patient needs to be on at least 6 weeks of antibiotics. -  4/2 PICC line placed on 4/1, have spoken with radiology department about obtaining NM WBC Scan Tumor Loc Limited scan.  Will be able to obtain on Monday.  Have let TOC know we need to  obtain the scan prior to patient's discharge to SNF -Patient to follow-up with Dr. Gale Journey as outpatient; per ID the following outpatient regimen is requested  -Weekly CBC with differential, CMP, CRP fax weekly labs to [phone]  -Clinic follow-up appointment on 4/27@1530  with Dr. [place] B[place]   -Pull PICC at completion of IV antibiotics       Chest pain/tachycardia -CTA chest negative PE -Resolved  Acute blood loss anemia?/Iron deficiency anemia -Anemia panel: Iron 21, TIBC 137, saturation ratio 15, ferritin 276, folate 17.8 and B12 708.  MMA 161/normal. -Per EMR patient was offered endoscopy on 3/24 but declined -4/2 fecal occult pending Lab Results  Component Value Date   HGB 7.9 (L) 08/24/2020   HGB 8.1 (L) 08/23/2020   HGB 9.0 (L) 08/22/2020   HGB 7.7 (L) 08/21/2020   HGB 7.9 (L) 08/20/2020     GERD/IBS    Thrombocytosis -Suspect reactive.    Most likely secondary to patient's chronic infection, and trauma onto her sacrum. -4/2 peripheral blood smear pending.  4/3 results from pathology still pending  Constipation/fecal impaction -CT A/P 3/21: Large amount of colonic stool with a large fecal ball within the rectum. -S/p enemas and disimpaction early in admission. -Follow-up KUB 3/30-fecal impaction has resolved.   -Due to multiple loose stools, reduced laxatives.  Right thyroid mass 2.8 x 3.5 cm, noted on CTA chest 3/27 -Ultrasound thyroid 3/28: Nodule 1 in inferior right thyroid lobe 3.9 x 2.5 x 3.3 cm-meets criteria for FNA-can be pursued as outpatient. -Nodule 2 in left superior thyroid lobe 1.6 x 1.2 x 1.4 cm, meets criteria for imaging follow-up with ultrasound in 1 year.  Bilateral upper lobe scarring, greater on the right -Noted on CTA chest 3/27 -QuantiFERON gold pending.  As per ID, low suspicion for pulmonary TB.  If the results come back positive, plan to treat for latent TB that she is a nursing home resident/advanced age once active TB ruled out.  Had  prior indeterminate result on QuantiFERON gold in 2017. -ID also recommends considering repeat imaging in the future to document stability of upper lobe changes.  Tobacco use -Cessation counseled.  Nicotine patch.  Multiple sclerosis -Dalfampridine 10 mg BID  Right lower extremity edema -Venous Doppler negative for DVT.  Essential hypertension -4/2 increase Toprol XL 125 mg daily   Type II DM, controlled -1/26 hemoglobin A1c =6.5. - 4/2 Lantus 5 units daily -4/3 DC sensitive SSI: Continue q 4hr CBG  Concern for neglect at SNF -[facility] consulted and will need a different SNF at discharge.  Will be difficult placement.  Acute metabolic encephalopathy -S96, folate, TSH: Normal -Likely related to acute infectious etiology as above, unclear if she has some underlying cognitive impairment. -4/1 resolved   Profound debility/functional quadriplegia -Multifactorial correct underlying issues if possible. -NOTE patient has not ambulated since January per patient  Hypomagnesmia -Magnesium goal> 2  Hypophosphatemia -Phosphorus goal> 2.5 -4/2 K-Phos IV 30 mmol   Nutritional Status Nutrition Problem: Increased nutrient needs Etiology: acute illness,wound healing Signs/Symptoms: estimated needs Interventions: [place] Breeze,Prostat,MVI,Juven,Refer to RD note for recommendations  Pressure Injury 06/17/20 Buttocks Right;Anterior Stage 4 - Full thickness tissue loss with exposed bone, tendon or muscle. Deep Oval shaped pressure wound (6cmx4cmx3cm) muscle and bone exposed, areas of necrotic tissue at the edges, red &  yellow inside w (Active)  06/17/20 2200  Location: Buttocks  Location Orientation: Right;Anterior (extending towards the sacrum)  Staging: Stage 4 - Full thickness tissue loss with exposed bone, tendon or muscle.  Wound Description (Comments): Deep Oval shaped pressure wound (6cmx4cmx3cm) muscle and bone exposed, areas of necrotic tissue at the edges, red & yellow  inside w/active bleeding & tunneling, foul smelling  Present on Admission: Yes    Goals of care  -4/1 consult Palliative Care; MSOF slowly deteriorating multiple hospitalizations COVID-19 pneumonia in December 2021 with resultant bedbound status complicated by sacral decubitus ulcer which became infected prompting admission 06/16/2020 and debridement 06/19/2020.  Ultimately she was discharged to [facility] 07/07/2020  Presented to the ED 08/11/2020 from SNF Discuss changing CODE STATUS to DNR, and placing patient on palliative status vs hospice.       DVT prophylaxis: SCD Code Status:  Family Communication: 4/2 spoke at length with Devon(daughter), and explained plan of care answered all questions.  Her major concern was that once patient left for rehab there would be no physician oversight.  I assured Devon that the SNF facility itself had a physician which oversaw all patients in addition patient would be following up with our infectious disease physicians as an outpatient Dr. Gale Journey or one of his colleagues.  Assured her that patient was stable for discharge and that they would be physician oversight.  Devon seem to be satisfied with the plan of care.    4/3 again spoke at length with [place] (daughter) and Crystal (sister) for at least 58 minutes and went over at length why patient could not remain in hospital for 6 weeks of antibiotics.  Ensured them that arrangements had been made for follow-up with infectious disease.  Also explained that the likelihood of her recovering from this illness was slim to none given the size of the sacral defect and patient's other multiple medical problems.  Also assured them that LCSW and nurse case managers would work with them to find appropriate SNF however it may not be the exact SNF that they would like to have her admitted to.  Status is: Inpatient    Dispo: The patient is from: SNF              Anticipated d/c is to: SNF              Anticipated d/c date is:??               Patient currently stable      Consultants:  Palliative care   Procedures/Significant Events:    I have personally reviewed and interpreted all radiology studies and my findings are as above.  VENTILATOR SETTINGS:    Cultures   Antimicrobials: Anti-infectives (From admission, onward)   Start     Ordered Stop   08/21/20 1600  ertapenem (INVANZ) 1,000 mg in sodium chloride 0.9 % 100 mL IVPB        08/21/20 1140     08/19/20 0600  vancomycin (VANCOREADY) IVPB 1000 mg/200 mL  Status:  Discontinued        08/18/20 0733 08/18/20 1244   08/18/20 1000  vancomycin (VANCOREADY) IVPB 1500 mg/300 mL  Status:  Discontinued        08/18/20 0733 08/18/20 1243   08/16/20 1800  meropenem (MERREM) 1 g in sodium chloride 0.9 % 100 mL IVPB  Status:  Discontinued        08/16/20 1524 08/21/20 1140   08/14/20 2200  Ampicillin-Sulbactam (UNASYN)  3 g in sodium chloride 0.9 % 100 mL IVPB  Status:  Discontinued        08/14/20 1603 08/16/20 1512   08/14/20 2200  doxycycline (VIBRA-TABS) tablet 100 mg  Status:  Discontinued        08/14/20 1603 08/16/20 1521   08/12/20 1400  vancomycin (VANCOREADY) IVPB 1250 mg/250 mL  Status:  Discontinued        08/11/20 1429 08/14/20 1603   08/11/20 2200  ceFEPIme (MAXIPIME) 2 g in sodium chloride 0.9 % 100 mL IVPB  Status:  Discontinued        08/11/20 1429 08/14/20 1603   08/11/20 2200  metroNIDAZOLE (FLAGYL) IVPB 500 mg  Status:  Discontinued        08/11/20 2128 08/14/20 1603   08/11/20 1245  ceFEPIme (MAXIPIME) 2 g in sodium chloride 0.9 % 100 mL IVPB        08/11/20 1236 08/11/20 1426   08/11/20 1245  metroNIDAZOLE (FLAGYL) IVPB 500 mg        08/11/20 1236 08/11/20 1517   08/11/20 1245  vancomycin (VANCOREADY) IVPB 1500 mg/300 mL        08/11/20 1236 08/11/20 1634       Devices    LINES / TUBES:  RIGHT arm single-lumen PICC 4/1>>>    Continuous Infusions: . sodium chloride 250 mL (08/20/20 1815)  . ertapenem 1,000 mg (08/24/20  0913)     Objective: Vitals:   08/24/20 0220 08/24/20 0601 08/24/20 0748 08/24/20 0816  BP: 126/74 123/72 122/66   Pulse: (!) 110 (!) 117 (!) 116   Resp: 20 20 (!) 24   Temp: 98.8 F (37.1 C) 98.2 F (36.8 C) 98.4 F (36.9 C)   TempSrc: Oral Oral Axillary   SpO2: 97% 95% 95% 97%  Weight:      Height:        Intake/Output Summary (Last 24 hours) at 08/24/2020 1126 Last data filed at 08/24/2020 6237 Gross per 24 hour  Intake 1068.28 ml  Output 1100 ml  Net -31.72 ml   Filed Weights   08/13/20 0900  Weight: 76.8 kg   Physical Exam:  General: A/O x4, No acute respiratory distress Eyes: negative scleral hemorrhage, negative anisocoria, negative icterus ENT: Negative Runny nose, negative gingival bleeding, Neck:  Negative scars, masses, torticollis, lymphadenopathy, JVD Lungs: Clear to auscultation bilaterally without wheezes or crackles Cardiovascular: Sinus tachycardia without murmur gallop or rub normal S1 and S2 Abdomen: negative abdominal pain, nondistended, positive soft, bowel sounds, no rebound, no ascites, no appreciable mass Extremities: No significant cyanosis, clubbing, or edema bilateral lower extremities Skin: Unstageable sacral decubitus ulcer see picture above Psychiatric:  Negative depression, negative anxiety, negative fatigue, negative mania POOR UNDERSTANDING of her multiple medical problems Central nervous system:  Cranial nerves II through XII intact, tongue/uvula midline, all extremities muscle strength 3/5, sensation intact throughout, negative dysarthria, negative expressive aphasia, negative receptive aphasia.   .     Data Reviewed: Care during the described time interval was provided by me .  I have reviewed this patient's available data, including medical history, events of note, physical examination, and all test results as part of my evaluation.  CBC: Recent Labs  Lab 08/18/20 0447 08/19/20 0552 08/20/20 0533 08/21/20 0500 08/22/20 0447  08/23/20 0413 08/24/20 0410  WBC 19.1* 17.5* 17.6* 15.6* 17.6* 16.2* 13.2*  NEUTROABS 15.4* 13.8* 13.7*  --   --  12.5* 9.4*  HGB 8.4* 8.0* 7.9* 7.7* 9.0* 8.1* 7.9*  HCT 26.2*  24.6* 24.9* 24.2* 27.6* 25.6* 25.8*  MCV 76.6* 76.4* 76.6* 76.6* 74.4* 76.2* 77.2*  PLT 707* 663* 655* 644* 622* 762* 563*   Basic Metabolic Panel: Recent Labs  Lab 08/20/20 0533 08/21/20 0500 08/22/20 0447 08/23/20 0413 08/24/20 0410  NA 135 136 133* 134* 134*  K 3.6 3.4* 4.2 4.2 4.0  CL 102 101 102 101 100  CO2 22 24 23 24 24   GLUCOSE 97 116* 103* 101* 95  BUN 11 8 5* 7 7  CREATININE 0.42* 0.47 0.38* 0.43* 0.36*  CALCIUM 8.9 8.5* 8.9 8.8* 9.1  MG 1.6* 1.9 1.6* 2.1 1.8  PHOS 2.0* 2.0* 2.0* 2.0* 3.4   GFR: Estimated Creatinine Clearance: 80.9 mL/min (A) (by C-G formula based on SCr of 0.36 mg/dL (L)). Liver Function Tests: Recent Labs  Lab 08/18/20 0447 08/19/20 0552 08/20/20 0533 08/23/20 0413 08/24/20 0410  AST 13* 11* 13* 21 16  ALT 18 16 16 28 28   ALKPHOS 100 97 99 103 112  BILITOT 0.5 0.6 0.7 0.4 0.4  PROT 5.2* 5.0* 5.0* 5.7* 5.8*  ALBUMIN 2.1* 1.9* 2.0* 2.1* 2.2*   No results for input(s): LIPASE, AMYLASE in the last 168 hours. No results for input(s): AMMONIA in the last 168 hours. Coagulation Profile: No results for input(s): INR, PROTIME in the last 168 hours. Cardiac Enzymes: No results for input(s): CKTOTAL, CKMB, CKMBINDEX, TROPONINI in the last 168 hours. BNP (last 3 results) No results for input(s): PROBNP in the last 8760 hours. HbA1C: No results for input(s): HGBA1C in the last 72 hours. CBG: Recent Labs  Lab 08/23/20 0745 08/23/20 1132 08/23/20 1549 08/23/20 2211 08/24/20 0724  GLUCAP 169* 108* 103* 116* 93   Lipid Profile: No results for input(s): CHOL, HDL, LDLCALC, TRIG, CHOLHDL, LDLDIRECT in the last 72 hours. Thyroid Function Tests: No results for input(s): TSH, T4TOTAL, FREET4, T3FREE, THYROIDAB in the last 72 hours. Anemia Panel: No results for  input(s): VITAMINB12, FOLATE, FERRITIN, TIBC, IRON, RETICCTPCT in the last 72 hours. Sepsis Labs: Recent Labs  Lab 08/18/20 0447 08/19/20 0552  PROCALCITON 1.73 0.64    Recent Results (from the past 240 hour(s))  Culture, blood (routine x 2)     Status: None   Collection Time: 08/15/20  3:20 PM   Specimen: BLOOD RIGHT HAND  Result Value Ref Range Status   Specimen Description   Final    BLOOD RIGHT HAND Performed at [facility] [address], [place], [place] [address]    Special Requests   Final    BOTTLES DRAWN AEROBIC ONLY Blood Culture adequate volume Performed at [facility] [address], [place], [place] [address]    Culture   Final    NO GROWTH 5 DAYS Performed at [facility], [place] [address] [place], [place] [address]    Report Status 08/20/2020 FINAL  Final  Culture, blood (routine x 2)     Status: None   Collection Time: 08/15/20  3:20 PM   Specimen: BLOOD LEFT HAND  Result Value Ref Range Status   Specimen Description   Final    BLOOD LEFT HAND Performed at [facility] [address] [place], [place] [address]    Special Requests   Final    BOTTLES DRAWN AEROBIC ONLY Blood Culture adequate volume Performed at [facility] [address], [place], [place] [address]    Culture   Final    NO GROWTH 5 DAYS Performed at [facility], [place] [address] [place], [place] [address]    Report Status 08/20/2020 FINAL  Final         Radiology Studies: [place] EKG SITE RITE  Result Date: 08/22/2020 If Site Rite image not attached, placement could not be confirmed due to current cardiac rhythm.       Scheduled Meds: . (feeding supplement) PROSource Plus  30 mL Oral BID BM  . vitamin C  500 mg Oral Daily  . Chlorhexidine Gluconate Cloth  6 each Topical Daily  . dalfampridine  10 mg Oral BID  . feeding supplement  1 Container Oral BID BM  . insulin aspart  0-5 Units  Subcutaneous QHS  . insulin aspart  0-9 Units Subcutaneous TID WC  . insulin glargine  5 Units Subcutaneous Daily  . metoprolol succinate  125 mg Oral Daily  . mometasone-formoterol  2 puff Inhalation BID  . multivitamin with minerals  1 tablet Oral Daily  . nutrition supplement (JUVEN)  1 packet Oral BID BM  . nystatin  5 mL Oral QID  . oxybutynin  10 mg Oral Daily  . pantoprazole  40 mg Oral BID  . polyethylene glycol  17 g Oral Daily  . senna-docusate  1 tablet Oral QHS  . sodium chloride flush  10-40 mL Intracatheter Q12H  . zinc sulfate  220 mg Oral Daily   Continuous Infusions: . sodium chloride 250 mL (08/20/20 1815)  . ertapenem 1,000 mg (08/24/20 0913)     LOS: 13 days    Time spent:40 min    Abiha Lukehart, Geraldo Docker, MD [facility]   If 7PM-7AM, please contact night-coverage 08/24/2020, 11:26 AM

## 2020-08-24 NOTE — TOC Progression Note (Signed)
Transition of Care [facility]) - Progression Note    Patient Details  Name: Tina Griffith MRN: [id number] Date of Birth: December 16, 1960  [facility] CM/SW [place], [place] Phone Number:  [phone] 08/24/2020, 3:57 PM  Clinical Narrative:     CSW spoke with patient's [name] (Daughter/HCPOA) [phone] and sister Georgiana Shore [phone]. CSW spoke with patient's family about SNF/LTC care choices and bed offers.  Family was adamant they were not happy with the facilities which offered placement.  Ms. Koleen Nimrod stated she would contact the news media if she was forced to a make choice from those facilities.  CSW explained to family the hospital did not have any decision making abilities on which facilities would offer for the patient.  CSW explained the facilities can pick and choose whichever patient they want to offer on and there is nothing the hospital can do to change their decision. CSW explained to the family about home health, palliative/hospice care and what Medicaid and Medicare cover if they chose to take the patient home instead of choosing a SNF.  Family requested the CSW speak with [facility] and with [facility].  CSW will make request for weekday [facility] staff check with [facility].  CSW contacted [facility] and requested they take a look at the patient for possible placement.  CSW made it clear to the family LTACH placement is not long-term care placement and once the patient improves she would need to find long-term care placement or return home. Family verbalized understanding.  Family and CSW spoke about Medicare appeals process.  CSW stated if the appeal were approved the patient may be able to stay at the hospital a little longer but she would eventually discharge and they would still need to go through the placement SNF/LTC process and the same parameters would still be in place. The facilities choose which patients they will take and the  hospital does not have a say in their decision making process. Family verbalized understanding.  Expected Discharge Plan: [place] Barriers to Discharge: Continued Medical Work up  Expected Discharge Plan and Services Expected Discharge Plan: [place]   Discharge Planning Services: CM Consult   Living arrangements for the past 2 months: [place]                                       Social Determinants of Health (SDOH) Interventions    Readmission Risk Interventions Readmission Risk Prevention Plan 06/18/2020  Transportation Screening Complete  PCP or Specialist Appt within 5-7 Days Complete  Home Care Screening Complete  Medication Review (RN CM) Complete  Some recent data might be hidden

## 2020-08-24 NOTE — Progress Notes (Signed)
   08/24/20 0601  Assess: MEWS Score  Temp 98.2 F (36.8 C)  BP 123/72  Pulse Rate (!) 117  Resp 20  Level of Consciousness Alert  SpO2 95 %  O2 Device Room Air  Assess: MEWS Score  MEWS Temp 0  MEWS Systolic 0  MEWS Pulse 2  MEWS RR 0  MEWS LOC 0  MEWS Score 2  MEWS Score Color Yellow  Assess: if the MEWS score is Yellow or Red  Were vital signs taken at a resting state? Yes (patient was tachycardic yesterday also.Chronic tacycardia)  Focused Assessment Change from prior assessment (see assessment flowsheet)  Early Detection of Sepsis Score *See Row Information* Medium  MEWS guidelines implemented *See Row Information* Yes  Treat  MEWS Interventions Administered scheduled meds/treatments  Pain Scale 0-10  Pain Score 0  Take Vital Signs  Increase Vital Sign Frequency  Yellow: Q 2hr X 2 then Q 4hr X 2, if remains yellow, continue Q 4hrs  Escalate  MEWS: Escalate Yellow: discuss with charge nurse/RN and consider discussing with provider and RRT  Notify: Charge Nurse/RN  Name of Charge Nurse/RN Notified [place]  Date Charge Nurse/RN Notified 08/24/20  Time Charge Nurse/RN Notified 0615  Document  Patient Outcome Stabilized after interventions  Progress note created (see row info) Yes  patient has chronic tachycardia, not in distress, no chest pain or SOB

## 2020-08-25 ENCOUNTER — Inpatient Hospital Stay (HOSPITAL_COMMUNITY): Payer: Medicare Other

## 2020-08-25 DIAGNOSIS — K5649 Other impaction of intestine: Secondary | ICD-10-CM

## 2020-08-25 DIAGNOSIS — A419 Sepsis, unspecified organism: Secondary | ICD-10-CM | POA: Diagnosis not present

## 2020-08-25 DIAGNOSIS — M4628 Osteomyelitis of vertebra, sacral and sacrococcygeal region: Secondary | ICD-10-CM | POA: Diagnosis not present

## 2020-08-25 DIAGNOSIS — G9341 Metabolic encephalopathy: Secondary | ICD-10-CM | POA: Diagnosis not present

## 2020-08-25 DIAGNOSIS — R509 Fever, unspecified: Secondary | ICD-10-CM | POA: Diagnosis not present

## 2020-08-25 LAB — CBC WITH DIFFERENTIAL/PLATELET
Abs Immature Granulocytes: 0.23 10*3/uL — ABNORMAL HIGH (ref 0.00–0.07)
Basophils Absolute: 0.1 10*3/uL (ref 0.0–0.1)
Basophils Relative: 1 %
Eosinophils Absolute: 0.2 10*3/uL (ref 0.0–0.5)
Eosinophils Relative: 1 %
HCT: 26.3 % — ABNORMAL LOW (ref 36.0–46.0)
Hemoglobin: 8.1 g/dL — ABNORMAL LOW (ref 12.0–15.0)
Immature Granulocytes: 2 %
Lymphocytes Relative: 15 %
Lymphs Abs: 2.2 10*3/uL (ref 0.7–4.0)
MCH: 24 pg — ABNORMAL LOW (ref 26.0–34.0)
MCHC: 30.8 g/dL (ref 30.0–36.0)
MCV: 77.8 fL — ABNORMAL LOW (ref 80.0–100.0)
Monocytes Absolute: 1.2 10*3/uL — ABNORMAL HIGH (ref 0.1–1.0)
Monocytes Relative: 9 %
Neutro Abs: 10.2 10*3/uL — ABNORMAL HIGH (ref 1.7–7.7)
Neutrophils Relative %: 72 %
Platelets: 741 10*3/uL — ABNORMAL HIGH (ref 150–400)
RBC: 3.38 MIL/uL — ABNORMAL LOW (ref 3.87–5.11)
RDW: 19.9 % — ABNORMAL HIGH (ref 11.5–15.5)
WBC: 14.1 10*3/uL — ABNORMAL HIGH (ref 4.0–10.5)
nRBC: 0.3 % — ABNORMAL HIGH (ref 0.0–0.2)

## 2020-08-25 LAB — GLUCOSE, CAPILLARY
Glucose-Capillary: 100 mg/dL — ABNORMAL HIGH (ref 70–99)
Glucose-Capillary: 111 mg/dL — ABNORMAL HIGH (ref 70–99)
Glucose-Capillary: 116 mg/dL — ABNORMAL HIGH (ref 70–99)
Glucose-Capillary: 125 mg/dL — ABNORMAL HIGH (ref 70–99)
Glucose-Capillary: 143 mg/dL — ABNORMAL HIGH (ref 70–99)
Glucose-Capillary: 155 mg/dL — ABNORMAL HIGH (ref 70–99)

## 2020-08-25 LAB — COMPREHENSIVE METABOLIC PANEL
ALT: 26 U/L (ref 0–44)
AST: 14 U/L — ABNORMAL LOW (ref 15–41)
Albumin: 2.2 g/dL — ABNORMAL LOW (ref 3.5–5.0)
Alkaline Phosphatase: 105 U/L (ref 38–126)
Anion gap: 10 (ref 5–15)
BUN: 7 mg/dL (ref 6–20)
CO2: 24 mmol/L (ref 22–32)
Calcium: 9.2 mg/dL (ref 8.9–10.3)
Chloride: 102 mmol/L (ref 98–111)
Creatinine, Ser: 0.38 mg/dL — ABNORMAL LOW (ref 0.44–1.00)
GFR, Estimated: >60 mL/min (ref >60)
Glucose, Bld: 108 mg/dL — ABNORMAL HIGH (ref 70–99)
Potassium: 4 mmol/L (ref 3.5–5.1)
Sodium: 136 mmol/L (ref 135–145)
Total Bilirubin: 0.5 mg/dL (ref 0.3–1.2)
Total Protein: 5.8 g/dL — ABNORMAL LOW (ref 6.5–8.1)

## 2020-08-25 LAB — MAGNESIUM: Magnesium: 1.7 mg/dL (ref 1.7–2.4)

## 2020-08-25 LAB — BLASTOMYCES ANTIGEN: Blastomyces Antigen: NOT DETECTED ng/mL

## 2020-08-25 LAB — PHOSPHORUS: Phosphorus: 2.9 mg/dL (ref 2.5–4.6)

## 2020-08-25 LAB — C-REACTIVE PROTEIN: CRP: 5.2 mg/dL — ABNORMAL HIGH (ref <1.0)

## 2020-08-25 LAB — OCCULT BLOOD X 1 CARD TO LAB, STOOL: Fecal Occult Bld: NEGATIVE

## 2020-08-25 MED ORDER — TECHNETIUM TC 99M EXAMETAZIME IV KIT
10.8000 | PACK | Freq: Once | INTRAVENOUS | Status: AC
  Administered 2020-08-25: 10.8 via INTRAVENOUS

## 2020-08-25 NOTE — Progress Notes (Signed)
Home for Infectious Disease  Date of Admission:  08/11/2020     CC: sepsis, sacral decubitus ulcer infection  Lines: 4/01-c rue single lumen picc  Abx: 3/31-c ertapenem  3/26-3/31 meropenem 3/24-26 doxy 3/24-26 amp-sulb 3/21-24 cefepime 3/21-24 vanc 3/21-24 metronidazole  ASSESSMENT: fuo Sacral decub ulcer associated chronic OM Hx covid infection 04/2020 Polypharmacy DM2 Hx MS/CVA with left hemiplegia; hx left optic neuritis Functionally debilitated Hx thyroid goiter Sickle Cell trait  60 yo female chronically debilitated, hx ms/cva left hemiplegia, covid pna 04/2020, recent admission 06/16/20 for sepsis in setting stage IV sacral decub ulcer (s/p I&D 1/27; bcx bacteroides; treated with 2 weeks abx until 2/09 dapto/erta --> doxy/augmentin), readmitted from nursing home 3/21 for family report worsening sacral wound in setting of it often being soiled, meeting sepsis criteria. Of note, does noted to have tarry stools this admission concerning for GIB  #chronic sacral decub chronic sacral pain with onset of ulcer since 05/2020 stable Had I&D previous admission 05/2020 but no wound cx sent Leukocytosis/thrombocytosis probably related to this process Unclear if fever due to this process also or unrelated  Ulcer is improving; crp down. Wbc/thrombocytosis up and down  -High risk recurrence and development of abx complication/MDRO. Will plan to treat this time for 6 weeks (had previously prolonged course  -ertapenem to finish 6 weeks on 09/11/20 -no change in opat or follow up information below  #fuo Unclear source Extensive w/u including negative abscess/LAD on ct body No vegetation on echo Qfever/quantiferon gold/brucella/bartonella serology negative this admission Pending urine histo ag and serum blasto antigen  -will see if can get tagged wbc scan or pet scan here. Result can be followed up outpatient.  -endemic fungi still pending, but can be  followed outpatient   #bilateral upper lobe scarring #hx indeterminate quantiferon gold As above suggested, very low suspicion of active tb Repeat quant gold is negative      ------------ opat arrangement Diagnosis: Chronic sacral decub om fuo Culture Result: none OPAT Order Details            .Outpatient Parenteral Antibiotic Therapy Consult  Until discontinued       Provider:  (Not yet assigned)  Question Answer Comment  Antibiotic Ertapenem (Invanz) IVPB   Indications for use Sacral decubitus ulcer/osteo   End Date 09/11/2020               [facility] Care Per Protocol: Home health RN for IV administration and teaching; PICC line care and labs.   Labs weekly while on IV antibiotics: _x_ CBC with differential __ BMP _x_ CMP _x_ CRP __ ESR __ Vancomycin trough __ CK  _x_ Please pull PIC at completion of IV antibiotics __ Please leave PIC in place until doctor has seen patient or been notified  Fax weekly labs to [phone]  Clinic Follow Up Appt: 4/27 @ 330 with Dr West Bali  @  [facility] [address], [place], [place] [address] Phone: [phone]     ---------------------   Principal Problem:   Sepsis [facility]) Active Problems:   Multiple sclerosis ([place])   Constipation   Decubital ulcer   Essential (primary) hypertension   Tobacco abuse   Anemia   FUO (fever of unknown origin)   Impaction of colon ([place])   AMS (altered mental status)   Diabetes ([place])   Fecal impaction (HCC)   Dark stools   Sacral osteomyelitis (HCC)   Thrombocytosis, unspecified   Thyroid  mass   Scarring of lung   Acute metabolic encephalopathy   Diabetes type 2, controlled ([place])   Functional quadriplegia (HCC)   Scheduled Meds: . (feeding supplement) PROSource Plus  30 mL Oral BID BM  . vitamin C  500 mg Oral Daily  . Chlorhexidine Gluconate Cloth  6 each Topical Daily  . dalfampridine  10 mg Oral BID  . feeding supplement  1 Container Oral BID BM  .  insulin glargine  5 Units Subcutaneous Daily  . metoprolol succinate  125 mg Oral Daily  . mometasone-formoterol  2 puff Inhalation BID  . multivitamin with minerals  1 tablet Oral Daily  . nutrition supplement (JUVEN)  1 packet Oral BID BM  . nystatin  5 mL Oral QID  . oxybutynin  10 mg Oral Daily  . pantoprazole  40 mg Oral BID  . polyethylene glycol  17 g Oral Daily  . senna-docusate  1 tablet Oral QHS  . sodium chloride flush  10-40 mL Intracatheter Q12H  . zinc sulfate  220 mg Oral Daily   Continuous Infusions: . sodium chloride 250 mL (08/20/20 1815)  . ertapenem 1,000 mg (08/24/20 0913)   PRN Meds:.sodium chloride, acetaminophen **OR** acetaminophen, oxyCODONE, sodium chloride flush   SUBJECTIVE: Afebrile last 4 days Stable sacral area pain No n/v/diarrhea/rash Labs reviewed crp down Wbc/platelet up and down no clear trend  Unclear if nuclear scan can be done, but waiting    Review of Systems: ROS All other ROS negative, except above  Allergies  Allergen Reactions  . No Known Allergies     OBJECTIVE: Vitals:   08/24/20 2019 08/24/20 2359 08/25/20 0400 08/25/20 0754  BP:  125/68 (!) 129/59   Pulse:  (!) 117 (!) 114   Resp:  (!) 22 20   Temp:  99.1 F (37.3 C) 97.9 F (36.6 C)   TempSrc:  Oral Oral   SpO2: 97% 97% 97% 97%  Weight:      Height:       Body mass index is 26.52 kg/m.  Physical Exam  General/constitutional: no distress, pleasant, chronically ill appearing HEENT: Normocephalic, PER, Conj Clear, EOMI, Oropharynx clear Neck supple CV: rrr no mrg Lungs: clear to auscultation, normal respiratory effort Abd: Soft, Nontender Ext: no edema Skin: No Rash Neuro: stable left  Hemiplegia and generalized weakness MSK: no peripheral joint swelling/tenderness/warmth; back spines nontender   Central line presence: yes RUE picc   Lab Results Lab Results  Component Value Date   WBC 14.1 (H) 08/25/2020   HGB 8.1 (L) 08/25/2020   HCT 26.3  (L) 08/25/2020   MCV 77.8 (L) 08/25/2020   PLT 741 (H) 08/25/2020    Lab Results  Component Value Date   CREATININE 0.38 (L) 08/25/2020   BUN 7 08/25/2020   NA 136 08/25/2020   K 4.0 08/25/2020   CL 102 08/25/2020   CO2 24 08/25/2020    Lab Results  Component Value Date   ALT 26 08/25/2020   AST 14 (L) 08/25/2020   ALKPHOS 105 08/25/2020   BILITOT 0.5 08/25/2020     Microbiology: Recent Results (from the past 240 hour(s))  Culture, blood (routine x 2)     Status: None   Collection Time: 08/15/20  3:20 PM   Specimen: BLOOD RIGHT HAND  Result Value Ref Range Status   Specimen Description   Final    BLOOD RIGHT HAND Performed at [facility], [place] [address], [place], [place] [address]  Special Requests   Final    BOTTLES DRAWN AEROBIC ONLY Blood Culture adequate volume Performed at [facility] [address] [place], [place] [address]    Culture   Final    NO GROWTH 5 DAYS Performed at [facility], [place] [address], [place], [place] [address]    Report Status 08/20/2020 FINAL  Final  Culture, blood (routine x 2)     Status: None   Collection Time: 08/15/20  3:20 PM   Specimen: BLOOD LEFT HAND  Result Value Ref Range Status   Specimen Description   Final    BLOOD LEFT HAND Performed at [facility] [address], [place], [place] [address]    Special Requests   Final    BOTTLES DRAWN AEROBIC ONLY Blood Culture adequate volume Performed at [facility] [address], [place], [place] [address]    Culture   Final    NO GROWTH 5 DAYS Performed at [facility], [place] [address] [place], [place] [address]    Report Status 08/20/2020 FINAL  Final    Serology:  Imaging: If present, new imagings (plain films, ct scans, and mri) have been personally visualized and interpreted; radiology reports have been reviewed. Decision making incorporated into the Impression /  Recommendations.  1/26 tte 1. Abnormal septal motion . Left ventricular ejection fraction, by  estimation, is 60 to 65%. The left ventricle has normal function. The left  ventricle has no regional wall motion abnormalities. There is mild left  ventricular hypertrophy. Left  ventricular diastolic parameters were normal.  2. Right ventricular systolic function is mildly reduced. The right  ventricular size is mildly enlarged. There is mildly elevated pulmonary  artery systolic pressure.  3. The mitral valve is normal in structure. Trivial mitral valve  regurgitation. No evidence of mitral stenosis.  4. The aortic valve is tricuspid. Aortic valve regurgitation is not  visualized. No aortic stenosis is present.  5. The inferior vena cava is normal in size with greater than 50%  respiratory variability, suggesting right atrial pressure of 3 mmHg.   3/21 abd pelv ct with contrast Large sacral decubitus ulceration with cortical irregularity at the inferior coccyx which could be due to chronic osteomyelitis as on prior exam. No loculated fluid collections or evidence of acute osteomyelitis  Large amount of colonic stool with a large fecal ball within the Rectum.  3/22 bilateral LE duplex u/s No dvt's  3/27 chest cta No evidence of pulmonary embolism. No acute intrathoracic abnormalities. Emphysematous changes with BILATERAL upper lobe scarring greater on RIGHT. RIGHT thyroid mass 2.8 x 3.5 cm; recommend thyroid ultrasound assessment.  3/28 duplex ext no dvt's  3/30 TTE 1. Left ventricular ejection fraction, by estimation, is 60 to 65%. The  left ventricle has normal function. The left ventricle has no regional  wall motion abnormalities.  2. Right ventricular systolic function is normal. The right ventricular  size is normal. There is normal pulmonary artery systolic pressure.  3. The mitral valve is normal in structure. Trivial mitral valve  regurgitation. No  evidence of mitral stenosis.  4. The aortic valve is tricuspid. Aortic valve regurgitation is not  visualized. No aortic stenosis is present.  5. The inferior vena cava is normal in size with greater than 50%  respiratory variability, suggesting right atrial pressure of 3 mmHg.   Jabier Mutton, [place] [facility] [place] [phone] pager  08/25/2020, 9:49 AM
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
## 2020-08-25 NOTE — Plan of Care (Signed)
Plan of care discussed with pt.

## 2020-08-25 NOTE — TOC Progression Note (Addendum)
Transition of Care [facility]) - Progression Note    Patient Details  Name: Tina Griffith MRN: [id number] Date of Birth: [birth year]  [facility] of [facility] CM/SW Contact  Alexandros Ewan, Juliann Pulse, RN Phone Number: 08/25/2020, 8:48 AM  Clinical Narrative: Awaiting call back from [place] for update on [employer] rep for [facility] Raquel Sarna will screen-await outcome-picc iv abx till 4/21;stage 4 sacral wound-paraplegic.   2:20p-[facility] rep Lexine Baton unable to accept. Awaiting outcome from [facility]. 4:30p-spoke to dtr [employer]) they prefer to expand the fl2 search to [place]-they do not want [facility] in [place].   5:30p-informed [place] that CM has already sent info to every facility within our hub system-out of courtesy CM faxed info to [employer] rep Dennis-fax#[phone]-he explaine he would review, they do not have LTC beds currently, & our limited when they do have LTC beds;[place] left vm [phone] w/call back tel#.  Expected Discharge Plan: [place] Barriers to Discharge: Continued Medical Work up  Expected Discharge Plan and Services Expected Discharge Plan: [place]   Discharge Planning Services: CM Consult   Living arrangements for the past 2 months: [place]                                       Social Determinants of Health (SDOH) Interventions    Readmission Risk Interventions Readmission Risk Prevention Plan 06/18/2020  Transportation Screening Complete  PCP or Specialist Appt within 5-7 Days Complete  Home Care Screening Complete  Medication Review (RN CM) Complete  Some recent data might be hidden

## 2020-08-26 DIAGNOSIS — A419 Sepsis, unspecified organism: Secondary | ICD-10-CM | POA: Diagnosis not present

## 2020-08-26 DIAGNOSIS — M4628 Osteomyelitis of vertebra, sacral and sacrococcygeal region: Secondary | ICD-10-CM | POA: Diagnosis not present

## 2020-08-26 DIAGNOSIS — D649 Anemia, unspecified: Secondary | ICD-10-CM | POA: Diagnosis not present

## 2020-08-26 DIAGNOSIS — G9341 Metabolic encephalopathy: Secondary | ICD-10-CM | POA: Diagnosis not present

## 2020-08-26 LAB — CBC WITH DIFFERENTIAL/PLATELET
Abs Immature Granulocytes: 0.25 10*3/uL — ABNORMAL HIGH (ref 0.00–0.07)
Basophils Absolute: 0.1 10*3/uL (ref 0.0–0.1)
Basophils Relative: 1 %
Eosinophils Absolute: 0.2 10*3/uL (ref 0.0–0.5)
Eosinophils Relative: 1 %
HCT: 25.7 % — ABNORMAL LOW (ref 36.0–46.0)
Hemoglobin: 8.1 g/dL — ABNORMAL LOW (ref 12.0–15.0)
Immature Granulocytes: 2 %
Lymphocytes Relative: 16 %
Lymphs Abs: 2.3 10*3/uL (ref 0.7–4.0)
MCH: 24.4 pg — ABNORMAL LOW (ref 26.0–34.0)
MCHC: 31.5 g/dL (ref 30.0–36.0)
MCV: 77.4 fL — ABNORMAL LOW (ref 80.0–100.0)
Monocytes Absolute: 1.3 10*3/uL — ABNORMAL HIGH (ref 0.1–1.0)
Monocytes Relative: 9 %
Neutro Abs: 10.2 10*3/uL — ABNORMAL HIGH (ref 1.7–7.7)
Neutrophils Relative %: 71 %
Platelets: 732 10*3/uL — ABNORMAL HIGH (ref 150–400)
RBC: 3.32 MIL/uL — ABNORMAL LOW (ref 3.87–5.11)
RDW: 20 % — ABNORMAL HIGH (ref 11.5–15.5)
WBC: 14.3 10*3/uL — ABNORMAL HIGH (ref 4.0–10.5)
nRBC: 0.2 % (ref 0.0–0.2)

## 2020-08-26 LAB — PHOSPHORUS: Phosphorus: 2.8 mg/dL (ref 2.5–4.6)

## 2020-08-26 LAB — GLUCOSE, CAPILLARY
Glucose-Capillary: 121 mg/dL — ABNORMAL HIGH (ref 70–99)
Glucose-Capillary: 107 mg/dL — ABNORMAL HIGH (ref 70–99)
Glucose-Capillary: 114 mg/dL — ABNORMAL HIGH (ref 70–99)
Glucose-Capillary: 115 mg/dL — ABNORMAL HIGH (ref 70–99)
Glucose-Capillary: 144 mg/dL — ABNORMAL HIGH (ref 70–99)

## 2020-08-26 LAB — COMPREHENSIVE METABOLIC PANEL
ALT: 22 U/L (ref 0–44)
AST: 12 U/L — ABNORMAL LOW (ref 15–41)
Albumin: 2.3 g/dL — ABNORMAL LOW (ref 3.5–5.0)
Alkaline Phosphatase: 103 U/L (ref 38–126)
Anion gap: 10 (ref 5–15)
BUN: 10 mg/dL (ref 6–20)
CO2: 25 mmol/L (ref 22–32)
Calcium: 9.4 mg/dL (ref 8.9–10.3)
Chloride: 103 mmol/L (ref 98–111)
Creatinine, Ser: 0.41 mg/dL — ABNORMAL LOW (ref 0.44–1.00)
GFR, Estimated: >60 mL/min (ref >60)
Glucose, Bld: 109 mg/dL — ABNORMAL HIGH (ref 70–99)
Potassium: 3.8 mmol/L (ref 3.5–5.1)
Sodium: 138 mmol/L (ref 135–145)
Total Bilirubin: 0.5 mg/dL (ref 0.3–1.2)
Total Protein: 5.9 g/dL — ABNORMAL LOW (ref 6.5–8.1)

## 2020-08-26 LAB — HISTOPLASMA ANTIGEN, URINE: Histoplasma Antigen, urine: <0.5 (ref <0.5)

## 2020-08-26 LAB — MAGNESIUM: Magnesium: 1.7 mg/dL (ref 1.7–2.4)

## 2020-08-26 MED ORDER — BISACODYL 10 MG RE SUPP: 10.0000 mg | Freq: Every day | RECTAL | Status: DC | PRN

## 2020-08-26 NOTE — TOC Progression Note (Signed)
Transition of Care [facility]) - Progression Note    Patient Details  Name: Iyari Hagner MRN: [id number] Date of Birth: 13-Sep-1960  [facility] CM/SW Contact  Joaquin Courts, RN Phone Number: 08/26/2020, 1:29 PM  Clinical Narrative:    CM followed up with [facility] [facility] rep Simona Huh who states facility will NOT make a bed offer.  Spoke with rep for [employer], who requested clinicals be re-faxed for review.  [facility] faxed and confirmed received, awaiting for rep to review to see if they can offer.     Expected Discharge Plan: [place] Barriers to Discharge: Continued Medical Work up  Expected Discharge Plan and Services Expected Discharge Plan: [place]   Discharge Planning Services: CM Consult   Living arrangements for the past 2 months: [place]                                       Social Determinants of Health (SDOH) Interventions    Readmission Risk Interventions Readmission Risk Prevention Plan 06/18/2020  Transportation Screening Complete  PCP or Specialist Appt within 5-7 Days Complete  Home Care Screening Complete  Medication Review (RN CM) Complete  Some recent data might be hidden

## 2020-08-26 NOTE — TOC Progression Note (Addendum)
Transition of Care [facility]) - Progression Note    Patient Details  Name: Laketra Bowdish MRN: [id number] Date of Birth: 1960-10-05  Transition of Care [facility]) CM/SW Contact  Joaquin Courts, RN Phone Number: 08/26/2020, 3:38 PM  Clinical Narrative:    CM followed up with [place] rep and confirmed facility is extending a bed offer.  Patient's daughter Morocco updated and notified of bed offer.  [place] accepts bed offer at [facility]. VM left at [facility] notifying if bed acceptance and planned discharge for 4/6.   Insurance [place] initiated, ref # [id number], requested clinicals faxed.  MD notified of need for repeat Covid test for facility admission.     Expected Discharge Plan: [place] Barriers to Discharge: Continued Medical Work up  Expected Discharge Plan and Services Expected Discharge Plan: [place]   Discharge Planning Services: CM Consult   Living arrangements for the past 2 months: [place]                                       Social Determinants of Health (SDOH) Interventions    Readmission Risk Interventions Readmission Risk Prevention Plan 06/18/2020  Transportation Screening Complete  PCP or Specialist Appt within 5-7 Days Complete  Home Care Screening Complete  Medication Review (RN CM) Complete  Some recent data might be hidden

## 2020-08-26 NOTE — Plan of Care (Signed)

## 2020-08-26 NOTE — Progress Notes (Signed)
Chart check   Tagged wbc scan without activity outside of the sacral area.   Clinically improving on meropenem --> ertapenem No new id changes  Please refer to 4/04 for final id recs/follow up
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
## 2020-08-27 DIAGNOSIS — D649 Anemia, unspecified: Secondary | ICD-10-CM | POA: Diagnosis not present

## 2020-08-27 DIAGNOSIS — M4628 Osteomyelitis of vertebra, sacral and sacrococcygeal region: Secondary | ICD-10-CM | POA: Diagnosis not present

## 2020-08-27 DIAGNOSIS — G9341 Metabolic encephalopathy: Secondary | ICD-10-CM | POA: Diagnosis not present

## 2020-08-27 DIAGNOSIS — A419 Sepsis, unspecified organism: Secondary | ICD-10-CM | POA: Diagnosis not present

## 2020-08-27 LAB — COMPREHENSIVE METABOLIC PANEL
ALT: 19 U/L (ref 0–44)
AST: 11 U/L — ABNORMAL LOW (ref 15–41)
Albumin: 2.3 g/dL — ABNORMAL LOW (ref 3.5–5.0)
Alkaline Phosphatase: 98 U/L (ref 38–126)
Anion gap: 10 (ref 5–15)
BUN: 11 mg/dL (ref 6–20)
CO2: 26 mmol/L (ref 22–32)
Calcium: 9.3 mg/dL (ref 8.9–10.3)
Chloride: 103 mmol/L (ref 98–111)
Creatinine, Ser: 0.35 mg/dL — ABNORMAL LOW (ref 0.44–1.00)
GFR, Estimated: >60 mL/min (ref >60)
Glucose, Bld: 104 mg/dL — ABNORMAL HIGH (ref 70–99)
Potassium: 4 mmol/L (ref 3.5–5.1)
Sodium: 139 mmol/L (ref 135–145)
Total Bilirubin: 0.6 mg/dL (ref 0.3–1.2)
Total Protein: 5.9 g/dL — ABNORMAL LOW (ref 6.5–8.1)

## 2020-08-27 LAB — PHOSPHORUS: Phosphorus: 3.1 mg/dL (ref 2.5–4.6)

## 2020-08-27 LAB — CBC WITH DIFFERENTIAL/PLATELET
Abs Immature Granulocytes: 0.28 10*3/uL — ABNORMAL HIGH (ref 0.00–0.07)
Basophils Absolute: 0.1 10*3/uL (ref 0.0–0.1)
Basophils Relative: 1 %
Eosinophils Absolute: 0.2 10*3/uL (ref 0.0–0.5)
Eosinophils Relative: 1 %
HCT: 25.4 % — ABNORMAL LOW (ref 36.0–46.0)
Hemoglobin: 7.7 g/dL — ABNORMAL LOW (ref 12.0–15.0)
Immature Granulocytes: 2 %
Lymphocytes Relative: 14 %
Lymphs Abs: 2.2 10*3/uL (ref 0.7–4.0)
MCH: 24 pg — ABNORMAL LOW (ref 26.0–34.0)
MCHC: 30.3 g/dL (ref 30.0–36.0)
MCV: 79.1 fL — ABNORMAL LOW (ref 80.0–100.0)
Monocytes Absolute: 1.3 10*3/uL — ABNORMAL HIGH (ref 0.1–1.0)
Monocytes Relative: 8 %
Neutro Abs: 11.3 10*3/uL — ABNORMAL HIGH (ref 1.7–7.7)
Neutrophils Relative %: 74 %
Platelets: 736 10*3/uL — ABNORMAL HIGH (ref 150–400)
RBC: 3.21 MIL/uL — ABNORMAL LOW (ref 3.87–5.11)
RDW: 20.9 % — ABNORMAL HIGH (ref 11.5–15.5)
WBC: 15.3 10*3/uL — ABNORMAL HIGH (ref 4.0–10.5)
nRBC: 0.1 % (ref 0.0–0.2)

## 2020-08-27 LAB — GLUCOSE, CAPILLARY
Glucose-Capillary: 105 mg/dL — ABNORMAL HIGH (ref 70–99)
Glucose-Capillary: 108 mg/dL — ABNORMAL HIGH (ref 70–99)
Glucose-Capillary: 114 mg/dL — ABNORMAL HIGH (ref 70–99)
Glucose-Capillary: 121 mg/dL — ABNORMAL HIGH (ref 70–99)
Glucose-Capillary: 131 mg/dL — ABNORMAL HIGH (ref 70–99)
Glucose-Capillary: 159 mg/dL — ABNORMAL HIGH (ref 70–99)

## 2020-08-27 LAB — MAGNESIUM: Magnesium: 1.7 mg/dL (ref 1.7–2.4)

## 2020-08-27 LAB — SARS CORONAVIRUS 2 (TAT 6-24 HRS): SARS Coronavirus 2: NEGATIVE

## 2020-08-27 LAB — TSH: TSH: 1.184 u[IU]/mL (ref 0.350–4.500)

## 2020-08-27 MED ORDER — SODIUM CHLORIDE 0.9 % IV BOLUS
1000.0000 mL | Freq: Once | INTRAVENOUS | Status: AC
  Administered 2020-08-27: 1000 mL via INTRAVENOUS

## 2020-08-27 MED ORDER — SODIUM CHLORIDE 0.9 % IV SOLN: INTRAVENOUS | Status: AC

## 2020-08-27 MED ORDER — ERTAPENEM IV (FOR PTA / DISCHARGE USE ONLY): 1.0000 g | INTRAVENOUS | 0 refills | Status: AC

## 2020-08-27 NOTE — TOC Progression Note (Signed)
Transition of Care [facility]) - Progression Note    Patient Details  Name: Tina Griffith MRN: [id number] Date of Birth: [birth year]  [facility] CM/SW Contact  Ross Ludwig, [place] Phone Number: 08/27/2020, 5:07 PM  Clinical Narrative:     CSW was informed that patient's insurance company requested a peer to peer with their Market researcher.  CSW updated attending physician, that the peer to peer needed to be completed by 4pm.  Peer to peer completed, patient was approved for SNF placement, reference number [id number], auth number [id number].  CSW contacted [facility] admissions at [facility] [phone], she requested that DC summary and Covid results be faxed to her tomorrow once patient is ready for discharge, fax number is [phone].  CSW updated attending physician, bedside nurse, and [name] [phone].  Daughter request a phone call once EMS arrives.   Expected Discharge Plan: Skilled Nursing Facility Barriers to Discharge: Insurance Authorization  Expected Discharge Plan and Services Expected Discharge Plan: [place]   Discharge Planning Services: CM Consult   Living arrangements for the past 2 months: [place]                                       Social Determinants of Health (SDOH) Interventions    Readmission Risk Interventions Readmission Risk Prevention Plan 06/18/2020  Transportation Screening Complete  PCP or Specialist Appt within 5-7 Days Complete  Home Care Screening Complete  Medication Review (RN CM) Complete  Some recent data might be hidden
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
## 2020-08-27 NOTE — Plan of Care (Signed)
Pt refused to be turned, drsg change and bath at this time. Educated pt. Pt agreed to a bath with drsg change in the morning. Will attempt to turn pt in the next 2hrs.

## 2020-08-28 DIAGNOSIS — M4628 Osteomyelitis of vertebra, sacral and sacrococcygeal region: Secondary | ICD-10-CM | POA: Diagnosis not present

## 2020-08-28 DIAGNOSIS — G9341 Metabolic encephalopathy: Secondary | ICD-10-CM | POA: Diagnosis not present

## 2020-08-28 DIAGNOSIS — D649 Anemia, unspecified: Secondary | ICD-10-CM | POA: Diagnosis not present

## 2020-08-28 DIAGNOSIS — A419 Sepsis, unspecified organism: Secondary | ICD-10-CM | POA: Diagnosis not present

## 2020-08-28 LAB — GLUCOSE, CAPILLARY
Glucose-Capillary: 100 mg/dL — ABNORMAL HIGH (ref 70–99)
Glucose-Capillary: 127 mg/dL — ABNORMAL HIGH (ref 70–99)
Glucose-Capillary: 128 mg/dL — ABNORMAL HIGH (ref 70–99)
Glucose-Capillary: 86 mg/dL (ref 70–99)
Glucose-Capillary: 90 mg/dL (ref 70–99)
Glucose-Capillary: 96 mg/dL (ref 70–99)

## 2020-08-28 LAB — CREATININE, SERUM
Creatinine, Ser: 0.46 mg/dL (ref 0.44–1.00)
GFR, Estimated: >60 mL/min (ref >60)

## 2020-08-28 LAB — MAGNESIUM: Magnesium: 1.5 mg/dL — ABNORMAL LOW (ref 1.7–2.4)

## 2020-08-28 LAB — PHOSPHORUS: Phosphorus: 2.9 mg/dL (ref 2.5–4.6)

## 2020-08-28 LAB — C-REACTIVE PROTEIN: CRP: 6.2 mg/dL — ABNORMAL HIGH (ref <1.0)

## 2020-08-28 MED ORDER — ENSURE ENLIVE PO LIQD
237.0000 mL | Freq: Two times a day (BID) | ORAL | Status: DC
  Administered 2020-08-28 – 2020-09-02 (×9): 237 mL via ORAL

## 2020-08-28 MED ORDER — MAGNESIUM SULFATE 2 GM/50ML IV SOLN
2.0000 g | Freq: Once | INTRAVENOUS | Status: AC
  Administered 2020-08-28: 2 g via INTRAVENOUS
  Filled 2020-08-28: qty 50

## 2020-08-28 NOTE — Progress Notes (Signed)
   08/28/20 0730  Assess: MEWS Score  Temp 98.2 F (36.8 C)  BP 120/64  Pulse Rate (!) 115  Resp 18  SpO2 94 %  O2 Device Room Air  Patient Activity (if Appropriate) In bed  Assess: MEWS Score  MEWS Temp 0  MEWS Systolic 0  MEWS Pulse 2  MEWS RR 0  MEWS LOC 0  MEWS Score 2  MEWS Score Color Yellow  Assess: if the MEWS score is Yellow or Red  Were vital signs taken at a resting state? Yes  Focused Assessment No change from prior assessment  Early Detection of Sepsis Score *See Row Information* Low  Notify: Provider  Provider Name/Title Dr. Louanne Belton (MD aware via secure chat)  Date Provider Notified 08/28/20  Time Provider Notified 0730  Notification Type Page (secure chat)  Provider response See new orders  Date of Provider Response 08/28/20  Time of Provider Response 0730
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
## 2020-08-28 NOTE — TOC Progression Note (Signed)
Transition of Care [facility]) - Progression Note    Patient Details  Name: Tina Griffith MRN: [id number] Date of Birth: [birth year]  Transition of Care [facility]) CM/SW Contact  Ross Ludwig, [place] Phone Number: 08/28/2020, 5:05 PM  Clinical Narrative:    Per attending physician, patient not medically ready for discharge today.  Patient has been approved for SNF placement, valid till end of day Friday.  Plan to go to [facility].  If patient does not discharge on Friday, patient will need a new covid test per SNF.  CSW to continue to follow patient's progress throughout discharge planning.   Expected Discharge Plan: Skilled Nursing Facility Barriers to Discharge: Insurance Authorization  Expected Discharge Plan and Services Expected Discharge Plan: [place]   Discharge Planning Services: CM Consult   Living arrangements for the past 2 months: [place]                                       Social Determinants of Health (SDOH) Interventions    Readmission Risk Interventions Readmission Risk Prevention Plan 06/18/2020  Transportation Screening Complete  PCP or Specialist Appt within 5-7 Days Complete  Home Care Screening Complete  Medication Review (RN CM) Complete  Some recent data might be hidden

## 2020-08-28 NOTE — Progress Notes (Signed)
Dressing change and bath completed, pt tolerated well.

## 2020-08-29 DIAGNOSIS — A419 Sepsis, unspecified organism: Secondary | ICD-10-CM | POA: Diagnosis not present

## 2020-08-29 DIAGNOSIS — D649 Anemia, unspecified: Secondary | ICD-10-CM | POA: Diagnosis not present

## 2020-08-29 DIAGNOSIS — R000 Tachycardia, unspecified: Secondary | ICD-10-CM

## 2020-08-29 DIAGNOSIS — M4628 Osteomyelitis of vertebra, sacral and sacrococcygeal region: Secondary | ICD-10-CM | POA: Diagnosis not present

## 2020-08-29 DIAGNOSIS — G9341 Metabolic encephalopathy: Secondary | ICD-10-CM | POA: Diagnosis not present

## 2020-08-29 LAB — BASIC METABOLIC PANEL
Anion gap: 9 (ref 5–15)
BUN: 7 mg/dL (ref 6–20)
CO2: 22 mmol/L (ref 22–32)
Calcium: 8.6 mg/dL — ABNORMAL LOW (ref 8.9–10.3)
Chloride: 109 mmol/L (ref 98–111)
Creatinine, Ser: 0.34 mg/dL — ABNORMAL LOW (ref 0.44–1.00)
GFR, Estimated: >60 mL/min (ref >60)
Glucose, Bld: 90 mg/dL (ref 70–99)
Potassium: 3.6 mmol/L (ref 3.5–5.1)
Sodium: 140 mmol/L (ref 135–145)

## 2020-08-29 LAB — CBC
HCT: 22.6 % — ABNORMAL LOW (ref 36.0–46.0)
Hemoglobin: 6.9 g/dL — CL (ref 12.0–15.0)
MCH: 24.4 pg — ABNORMAL LOW (ref 26.0–34.0)
MCHC: 30.5 g/dL (ref 30.0–36.0)
MCV: 79.9 fL — ABNORMAL LOW (ref 80.0–100.0)
Platelets: 623 10*3/uL — ABNORMAL HIGH (ref 150–400)
RBC: 2.83 MIL/uL — ABNORMAL LOW (ref 3.87–5.11)
RDW: 20.5 % — ABNORMAL HIGH (ref 11.5–15.5)
WBC: 11.8 10*3/uL — ABNORMAL HIGH (ref 4.0–10.5)
nRBC: 0 % (ref 0.0–0.2)

## 2020-08-29 LAB — GLUCOSE, CAPILLARY
Glucose-Capillary: 110 mg/dL — ABNORMAL HIGH (ref 70–99)
Glucose-Capillary: 86 mg/dL (ref 70–99)
Glucose-Capillary: 87 mg/dL (ref 70–99)
Glucose-Capillary: 90 mg/dL (ref 70–99)
Glucose-Capillary: 91 mg/dL (ref 70–99)
Glucose-Capillary: 98 mg/dL (ref 70–99)

## 2020-08-29 LAB — PREPARE RBC (CROSSMATCH)

## 2020-08-29 LAB — MAGNESIUM: Magnesium: 1.8 mg/dL (ref 1.7–2.4)

## 2020-08-29 LAB — ABO/RH: ABO/RH(D): O POS

## 2020-08-29 MED ORDER — SODIUM CHLORIDE 0.9% IV SOLUTION: Freq: Once | INTRAVENOUS | Status: AC

## 2020-08-29 NOTE — TOC Progression Note (Signed)
Transition of Care [facility]) - Progression Note    Patient Details  Name: Tina Griffith MRN: [id number] Date of Birth: 1960-12-06  [facility]) CM/SW Contact  Solangel Mcmanaway, Juliann Pulse, RN Phone Number: 08/29/2020, 12:02 PM  Clinical Narrative: Damaris Schooner to [facility] care-[name] admissions coordinator(tel#[phone]/fax#[phone]-following-informed patient not medically stable-fevers, blood transfusion, tachycardia.Will need auth to restart again once stable, & covid to be ordered.    Expected Discharge Plan: [place] Barriers to Discharge: Continued Medical Work up  Expected Discharge Plan and Services Expected Discharge Plan: [place]   Discharge Planning Services: CM Consult   Living arrangements for the past 2 months: [place]                                       Social Determinants of Health (SDOH) Interventions    Readmission Risk Interventions Readmission Risk Prevention Plan 06/18/2020  Transportation Screening Complete  PCP or Specialist Appt within 5-7 Days Complete  Home Care Screening Complete  Medication Review (RN CM) Complete  Some recent data might be hidden

## 2020-08-29 NOTE — Plan of Care (Signed)
  Problem: Clinical Measurements: Goal: Diagnostic test results will improve Outcome: Progressing   Problem: Nutrition: Goal: Adequate nutrition will be maintained Outcome: Progressing   Problem: Elimination: Goal: Will not experience complications related to bowel motility Outcome: Progressing   Problem: Safety: Goal: Ability to remain free from injury will improve Outcome: Progressing
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
## 2020-08-30 DIAGNOSIS — D649 Anemia, unspecified: Secondary | ICD-10-CM | POA: Diagnosis not present

## 2020-08-30 DIAGNOSIS — A419 Sepsis, unspecified organism: Secondary | ICD-10-CM | POA: Diagnosis not present

## 2020-08-30 DIAGNOSIS — G9341 Metabolic encephalopathy: Secondary | ICD-10-CM | POA: Diagnosis not present

## 2020-08-30 DIAGNOSIS — M4628 Osteomyelitis of vertebra, sacral and sacrococcygeal region: Secondary | ICD-10-CM | POA: Diagnosis not present

## 2020-08-30 LAB — BPAM RBC
Blood Product Expiration Date: 202205082359
Blood Product Expiration Date: 202205082359
ISSUE DATE / TIME: 202204081358
ISSUE DATE / TIME: 202204081839
Unit Type and Rh: 5100
Unit Type and Rh: 5100

## 2020-08-30 LAB — MAGNESIUM: Magnesium: 1.5 mg/dL — ABNORMAL LOW (ref 1.7–2.4)

## 2020-08-30 LAB — CBC
HCT: 31.5 % — ABNORMAL LOW (ref 36.0–46.0)
Hemoglobin: 10 g/dL — ABNORMAL LOW (ref 12.0–15.0)
MCH: 25.7 pg — ABNORMAL LOW (ref 26.0–34.0)
MCHC: 31.7 g/dL (ref 30.0–36.0)
MCV: 81 fL (ref 80.0–100.0)
Platelets: 592 10*3/uL — ABNORMAL HIGH (ref 150–400)
RBC: 3.89 MIL/uL (ref 3.87–5.11)
RDW: 19.6 % — ABNORMAL HIGH (ref 11.5–15.5)
WBC: 13.5 10*3/uL — ABNORMAL HIGH (ref 4.0–10.5)
nRBC: 0.1 % (ref 0.0–0.2)

## 2020-08-30 LAB — GLUCOSE, CAPILLARY
Glucose-Capillary: 101 mg/dL — ABNORMAL HIGH (ref 70–99)
Glucose-Capillary: 102 mg/dL — ABNORMAL HIGH (ref 70–99)
Glucose-Capillary: 133 mg/dL — ABNORMAL HIGH (ref 70–99)
Glucose-Capillary: 135 mg/dL — ABNORMAL HIGH (ref 70–99)
Glucose-Capillary: 140 mg/dL — ABNORMAL HIGH (ref 70–99)
Glucose-Capillary: 93 mg/dL (ref 70–99)
Glucose-Capillary: 96 mg/dL (ref 70–99)

## 2020-08-30 LAB — TYPE AND SCREEN
ABO/RH(D): O POS
Antibody Screen: NEGATIVE
Unit division: 0
Unit division: 0

## 2020-08-30 LAB — CREATININE, SERUM
Creatinine, Ser: 0.4 mg/dL — ABNORMAL LOW (ref 0.44–1.00)
GFR, Estimated: >60 mL/min (ref >60)

## 2020-08-30 MED ORDER — MAGNESIUM OXIDE 400 (241.3 MG) MG PO TABS
400.0000 mg | ORAL_TABLET | Freq: Two times a day (BID) | ORAL | Status: DC
  Administered 2020-08-30 – 2020-09-02 (×7): 400 mg via ORAL
  Filled 2020-08-30 (×7): qty 1

## 2020-08-30 MED ORDER — MAGNESIUM SULFATE 2 GM/50ML IV SOLN
2.0000 g | Freq: Once | INTRAVENOUS | Status: AC
  Administered 2020-08-30: 2 g via INTRAVENOUS
  Filled 2020-08-30: qty 50

## 2020-08-30 MED ORDER — METOPROLOL SUCCINATE ER 25 MG PO TB24
25.0000 mg | ORAL_TABLET | Freq: Every day | ORAL | Status: DC
  Administered 2020-08-30 – 2020-08-31 (×2): 25 mg via ORAL
  Filled 2020-08-30 (×2): qty 1
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
## 2020-08-30 NOTE — Plan of Care (Signed)

## 2020-08-30 NOTE — TOC Progression Note (Signed)
Transition of Care [facility]) - Progression Note    Patient Details  Name: Tina Griffith MRN: [id number] Date of Birth: 05/07/1961  Transition of Care [facility]) CM/SW Contact  Joaquin Courts, RN Phone Number: 08/30/2020, 3:04 PM  Clinical Narrative:    Per MD patient may be medically ready to dc to snf tomorrow 4/10.  CM confirmed with [facility] care that a bed will be available, new authorization requests initiated (pervious Josem Kaufmann has expired) reference # [id number], requested clinicals faxed.  MD notified patient will need an updated covid test for facility admission.   Expected Discharge Plan: [place] Barriers to Discharge: Continued Medical Work up  Expected Discharge Plan and Services Expected Discharge Plan: [place]   Discharge Planning Services: CM Consult   Living arrangements for the past 2 months: [place]                                       Social Determinants of Health (SDOH) Interventions    Readmission Risk Interventions Readmission Risk Prevention Plan 06/18/2020  Transportation Screening Complete  PCP or Specialist Appt within 5-7 Days Complete  Home Care Screening Complete  Medication Review (RN CM) Complete  Some recent data might be hidden
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
## 2020-08-31 DIAGNOSIS — M4628 Osteomyelitis of vertebra, sacral and sacrococcygeal region: Secondary | ICD-10-CM | POA: Diagnosis not present

## 2020-08-31 DIAGNOSIS — G9341 Metabolic encephalopathy: Secondary | ICD-10-CM | POA: Diagnosis not present

## 2020-08-31 DIAGNOSIS — D649 Anemia, unspecified: Secondary | ICD-10-CM | POA: Diagnosis not present

## 2020-08-31 DIAGNOSIS — A419 Sepsis, unspecified organism: Secondary | ICD-10-CM | POA: Diagnosis not present

## 2020-08-31 LAB — CBC
HCT: 31.6 % — ABNORMAL LOW (ref 36.0–46.0)
Hemoglobin: 10.1 g/dL — ABNORMAL LOW (ref 12.0–15.0)
MCH: 25.8 pg — ABNORMAL LOW (ref 26.0–34.0)
MCHC: 32 g/dL (ref 30.0–36.0)
MCV: 80.8 fL (ref 80.0–100.0)
Platelets: 623 10*3/uL — ABNORMAL HIGH (ref 150–400)
RBC: 3.91 MIL/uL (ref 3.87–5.11)
RDW: 20.1 % — ABNORMAL HIGH (ref 11.5–15.5)
WBC: 10.9 10*3/uL — ABNORMAL HIGH (ref 4.0–10.5)
nRBC: 0 % (ref 0.0–0.2)

## 2020-08-31 LAB — BASIC METABOLIC PANEL
Anion gap: 10 (ref 5–15)
BUN: 10 mg/dL (ref 6–20)
CO2: 24 mmol/L (ref 22–32)
Calcium: 8.9 mg/dL (ref 8.9–10.3)
Chloride: 106 mmol/L (ref 98–111)
Creatinine, Ser: 0.49 mg/dL (ref 0.44–1.00)
GFR, Estimated: >60 mL/min (ref >60)
Glucose, Bld: 96 mg/dL (ref 70–99)
Potassium: 3.5 mmol/L (ref 3.5–5.1)
Sodium: 140 mmol/L (ref 135–145)

## 2020-08-31 LAB — MAGNESIUM: Magnesium: 1.9 mg/dL (ref 1.7–2.4)

## 2020-08-31 LAB — C-REACTIVE PROTEIN: CRP: 7 mg/dL — ABNORMAL HIGH (ref <1.0)

## 2020-08-31 LAB — GLUCOSE, CAPILLARY
Glucose-Capillary: 104 mg/dL — ABNORMAL HIGH (ref 70–99)
Glucose-Capillary: 95 mg/dL (ref 70–99)
Glucose-Capillary: 106 mg/dL — ABNORMAL HIGH (ref 70–99)
Glucose-Capillary: 118 mg/dL — ABNORMAL HIGH (ref 70–99)
Glucose-Capillary: 116 mg/dL — ABNORMAL HIGH (ref 70–99)

## 2020-08-31 LAB — SARS CORONAVIRUS 2 (TAT 6-24 HRS): SARS Coronavirus 2: NEGATIVE

## 2020-08-31 NOTE — TOC Progression Note (Signed)
Transition of Care [facility]) - Progression Note    Patient Details  Name: Tina Griffith MRN: [id number] Date of Birth: Dec 19, 1960  Transition of Care [facility]) CM/SW [place], [place] Phone Number: [phone] 08/31/2020, 12:01 PM  Clinical Narrative:     SNF insurance authorization still pending REF ID# [id number].  Bed offer at [facility] pending authorization.  Expected Discharge Plan: [place] Barriers to Discharge: Continued Medical Work up  Expected Discharge Plan and Services Expected Discharge Plan: [place]   Discharge Planning Services: CM Consult   Living arrangements for the past 2 months: [place]                                       Social Determinants of Health (SDOH) Interventions    Readmission Risk Interventions Readmission Risk Prevention Plan 06/18/2020  Transportation Screening Complete  PCP or Specialist Appt within 5-7 Days Complete  Home Care Screening Complete  Medication Review (RN CM) Complete  Some recent data might be hidden
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
## 2020-09-01 DIAGNOSIS — M4628 Osteomyelitis of vertebra, sacral and sacrococcygeal region: Secondary | ICD-10-CM | POA: Diagnosis not present

## 2020-09-01 DIAGNOSIS — D649 Anemia, unspecified: Secondary | ICD-10-CM | POA: Diagnosis not present

## 2020-09-01 DIAGNOSIS — G9341 Metabolic encephalopathy: Secondary | ICD-10-CM | POA: Diagnosis not present

## 2020-09-01 DIAGNOSIS — A419 Sepsis, unspecified organism: Secondary | ICD-10-CM | POA: Diagnosis not present

## 2020-09-01 LAB — CREATININE, SERUM
Creatinine, Ser: 0.41 mg/dL — ABNORMAL LOW (ref 0.44–1.00)
GFR, Estimated: >60 mL/min (ref >60)

## 2020-09-01 LAB — GLUCOSE, CAPILLARY
Glucose-Capillary: 103 mg/dL — ABNORMAL HIGH (ref 70–99)
Glucose-Capillary: 116 mg/dL — ABNORMAL HIGH (ref 70–99)
Glucose-Capillary: 122 mg/dL — ABNORMAL HIGH (ref 70–99)
Glucose-Capillary: 135 mg/dL — ABNORMAL HIGH (ref 70–99)
Glucose-Capillary: 87 mg/dL (ref 70–99)
Glucose-Capillary: 94 mg/dL (ref 70–99)
Glucose-Capillary: 99 mg/dL (ref 70–99)

## 2020-09-01 LAB — CORTISOL: Cortisol, Plasma: 15.2 ug/dL

## 2020-09-01 LAB — MAGNESIUM: Magnesium: 1.7 mg/dL (ref 1.7–2.4)

## 2020-09-01 MED ORDER — METOPROLOL SUCCINATE ER 100 MG PO TB24
100.0000 mg | ORAL_TABLET | Freq: Every day | ORAL | Status: DC
  Administered 2020-09-01 – 2020-09-02 (×2): 100 mg via ORAL
  Filled 2020-09-01 (×2): qty 1

## 2020-09-01 MED ORDER — DILTIAZEM HCL 30 MG PO TABS
30.0000 mg | ORAL_TABLET | Freq: Three times a day (TID) | ORAL | Status: DC
  Administered 2020-09-01 – 2020-09-02 (×3): 30 mg via ORAL
  Filled 2020-09-01 (×3): qty 1

## 2020-09-01 NOTE — TOC Progression Note (Signed)
Transition of Care [facility]) - Progression Note    Patient Details  Name: Tina Griffith MRN: [id number] Date of Birth: [birth year]  [facility]) CM/SW Contact  Aricela Bertagnolli, Juliann Pulse, RN Phone Number: 09/01/2020, 11:44 AM  Clinical Narrative:Per MD not medically stable today. Received Josem Kaufmann BTCY#[id number].covid neg 4/9-for [facility] [place] [address].      Expected Discharge Plan: [place] Barriers to Discharge: Continued Medical Work up  Expected Discharge Plan and Services Expected Discharge Plan: [place]   Discharge Planning Services: CM Consult   Living arrangements for the past 2 months: [place]                                       Social Determinants of Health (SDOH) Interventions    Readmission Risk Interventions Readmission Risk Prevention Plan 06/18/2020  Transportation Screening Complete  PCP or Specialist Appt within 5-7 Days Complete  Home Care Screening Complete  Medication Review (RN CM) Complete  Some recent data might be hidden
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
## 2020-09-02 LAB — CBC
HCT: 33.1 % — ABNORMAL LOW (ref 36.0–46.0)
Hemoglobin: 10.4 g/dL — ABNORMAL LOW (ref 12.0–15.0)
MCH: 26 pg (ref 26.0–34.0)
MCHC: 31.4 g/dL (ref 30.0–36.0)
MCV: 82.8 fL (ref 80.0–100.0)
Platelets: 609 10*3/uL — ABNORMAL HIGH (ref 150–400)
RBC: 4 MIL/uL (ref 3.87–5.11)
RDW: 20.2 % — ABNORMAL HIGH (ref 11.5–15.5)
WBC: 9.8 10*3/uL (ref 4.0–10.5)
nRBC: 0 % (ref 0.0–0.2)

## 2020-09-02 LAB — BASIC METABOLIC PANEL
Anion gap: 9 (ref 5–15)
BUN: 18 mg/dL (ref 6–20)
CO2: 24 mmol/L (ref 22–32)
Calcium: 9 mg/dL (ref 8.9–10.3)
Chloride: 107 mmol/L (ref 98–111)
Creatinine, Ser: 0.43 mg/dL — ABNORMAL LOW (ref 0.44–1.00)
GFR, Estimated: >60 mL/min (ref >60)
Glucose, Bld: 93 mg/dL (ref 70–99)
Potassium: 3.6 mmol/L (ref 3.5–5.1)
Sodium: 140 mmol/L (ref 135–145)

## 2020-09-02 LAB — GLUCOSE, CAPILLARY
Glucose-Capillary: 105 mg/dL — ABNORMAL HIGH (ref 70–99)
Glucose-Capillary: 90 mg/dL (ref 70–99)
Glucose-Capillary: 125 mg/dL — ABNORMAL HIGH (ref 70–99)

## 2020-09-02 LAB — MAGNESIUM: Magnesium: 1.7 mg/dL (ref 1.7–2.4)

## 2020-09-02 MED ORDER — SENNOSIDES-DOCUSATE SODIUM 8.6-50 MG PO TABS: 1.0000 | ORAL_TABLET | Freq: Every day | ORAL | Status: AC

## 2020-09-02 MED ORDER — DILTIAZEM HCL ER COATED BEADS 120 MG PO CP24: 120.0000 mg | ORAL_CAPSULE | Freq: Every day | ORAL | Status: AC

## 2020-09-02 MED ORDER — MAGNESIUM OXIDE 400 (241.3 MG) MG PO TABS: 400.0000 mg | ORAL_TABLET | Freq: Every day | ORAL | 0 refills | Status: AC

## 2020-09-02 MED ORDER — PANTOPRAZOLE SODIUM 40 MG PO TBEC: 40.0000 mg | DELAYED_RELEASE_TABLET | Freq: Two times a day (BID) | ORAL | Status: AC

## 2020-09-02 MED ORDER — OXYCODONE HCL 5 MG PO TABS: 5.0000 mg | ORAL_TABLET | Freq: Four times a day (QID) | ORAL | 0 refills | Status: AC | PRN

## 2020-09-02 MED ORDER — HEPARIN SOD (PORK) LOCK FLUSH 100 UNIT/ML IV SOLN
250.0000 [IU] | INTRAVENOUS | Status: DC | PRN
  Administered 2020-09-02: 250 [IU]
  Filled 2020-09-02 (×2): qty 2.5

## 2020-09-02 MED ORDER — HEPARIN SOD (PORK) LOCK FLUSH 100 UNIT/ML IV SOLN
250.0000 [IU] | Freq: Every day | INTRAVENOUS | Status: DC
  Filled 2020-09-02: qty 2.5

## 2020-09-02 MED ORDER — POLYETHYLENE GLYCOL 3350 17 G PO PACK: 17.0000 g | PACK | Freq: Every day | ORAL | 0 refills | Status: AC | PRN

## 2020-09-02 MED ORDER — NYSTATIN 100000 UNIT/ML MT SUSP: 5.0000 mL | Freq: Four times a day (QID) | OROMUCOSAL | Status: AC

## 2020-09-02 MED ORDER — BISACODYL 10 MG RE SUPP: 10.0000 mg | Freq: Every day | RECTAL | Status: AC | PRN

## 2020-09-02 NOTE — Discharge Summary (Addendum)
Physician Discharge Summary  Tina Griffith SAY:[id number] DOB: Jun 28, 1960 DOA: 08/11/2020  PCP: [name]  Admit date: 08/11/2020 Discharge date: 09/02/2020  Admitted From: Skilled nursing facility   Discharge disposition: SNF  Recommendations for Outpatient Follow-Up:   Follow up with your primary care provider at the skilled nursing nursing facility in 3-5 days Recommend skilled nursing facility with palliative care support.   Patient will continue ertapenem end date of 09/11/2020. Weekly CBC with differential, CMP, CRP fax weekly labs to [phone]  -Clinic follow-up appointment on 4/27@1530  with Dr. West Bali   -Pull PICC at completion of IV antibiotics Please consider thyroid ultrasound in 1 year for follow-up of thyroid nodules. Continue Foley catheter for decubitus ulceration management.  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  Diet recommendation: Soft diabetic diet.  Wound care: Sacral decubitus ulceration wound care   Code status: Full.  History of Present Illness:   Patient is a 60 years old female with history of multiple sclerosis, type 2 diabetes, hypertension, irritable bowel syndrome, history of Covid pneumonia in December 2021 with resultant bedbound status, sacral decubitus ulceration status post debridement on 06/19/2020 was recently discharged to skilled nursing facility on 07/07/2020 and was brought into the ED on 08/11/2020 with  fever, drainage from the sacral wound, altered mental status and suspected neglect at the skilled nursing facility.  Patient was admitted ro the hospital for sepsis secondary to infected sacral decubitus ulceration with chronic osteomyelitis.  Infectious disease was consulted and antibiotics were changed to ertapenem.    Hospital Course:   Following conditions were addressed during hospitalization as listed below,  Sepsis due to infected sacral decubitus ulcer and chronic osteomyelitis CT abdomen and pelvis done on 08/11/20 showed large sacral decubitus ulceration with chronic osteomyelitis.  General surgery was consulted, no surgical intervention was pursued.  Infectious disease recommended ertapenem after several antibiotic changes. End date of antibiotic 09/11/2020, patient is status post PICC line placement..  WBC Nuclear scan with focal infection of the coccygeal region.  It is very unlikely that the large ulcer will heal in the context of bedbound status poor nutritional status.  Continue Foley catheter for now.    Palliative care saw the patient and patient is full code and patient wants to proceed with IV antibiotics, wound care at the skilled nursing facility.  Recommended skilled nursing facility with palliative care support.  Patient will need to follow-up with Dr.  [name] as outpatient.  Patient is currently afebrile.   Chest pain. Resolved.  CTA of the chest was negative for pulmonary embolism.     Sinus tachycardia,    hemodynamically stable.  She does have chronic baseline pain which could be contributing to it.    Improved.  TSH within normal limits.   Was volume resuscitated during the hospitalization including PRBC transfusion.  Cardiology was consulted.  On metoprolol from home.  Cardizem was also initiated. 2D echocardiogram with preserved LV  function. Recent 2D echocardiogram on 08/20/2020 showed LV ejection fraction of 60 to 65%.    Acute blood loss anemia with iron deficiency  anemia Received 1 unit of packed RBC with improvement in hemoglobin to 10.4.,  Monitor.  Weekly   GERD/IBS Continue Protonix twice a day.  Continue MiraLAX and Senokot for adequate bowel movements.  Thrombocytosis/leukocytosis Leukocytosis resolved.  Thrombocytosis persists.  Constipation/fecal impaction Continue bowel regimen.  Initially needed enema and disimpaction.  Has been having bowel movements on the current regimen..   Right thyroid mass 2.8 x 3.5 cm, noted on CTA chest 3/27 -Ultrasound thyroid 3/28: Nodule 1 in inferior right thyroid lobe 3.9 x 2.5 x 3.3 cm-meets criteria for FNA-can be pursued as outpatient. Nodule 2 in left superior thyroid lobe 1.6 x 1.2 x 1.4 cm, meets criteria for imaging follow-up with ultrasound in 1 year.  TSH within normal limits.   Bilateral upper lobe scarring, more on the right. Noted on CTA chest 03/27.  QuantiFERON gold negative.  Might need outpatient follow-up for this..   Tobacco use Received nicotine patch during hospitalization.  Chronic kidney disease, clinically undetermined.  Continue to monitor as outpatient.   Multiple sclerosis with bedbound status. -On dalfampridine 10 mg BID.  Gets infusion every monthly. Follows up with [name]   Right lower extremity edema  venous duplex was negative for DVT.     Essential hypertension Currently on Toprol XL and long-acting Cardizem.  Blood pressure is stable.  Type II DM, controlled Latest hemoglobin A1c on 1/26 at 6.5.    Resume Trulicity, diabetic diet on discharge Latest POC glucose of 90.   Acute metabolic encephalopathy Improved.  Likely secondary to infection with some underlying cognitive impairment.  At baseline at this time.   Profound debility/functional quadriplegia Currently at the skilled nursing facility.  Has not ambulated since January, 2022.    Continue physical therapy as able at the skilled nursing facility.   Hypomagnesmia Resolved.  Latest magnesium of  4.7.   Hypophosphatemia Improved after replacement.     Nutritional Status Increased nutritional needs.  Continue nutritional supplements.     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 Continue wound care.  Currently getting wound care twice a day.  Bedbound status.  Pressure Injury 06/17/20 Buttocks Right;Anterior Stage 4 - Full thickness tissue loss with exposed bone, tendon or muscle. Deep Oval shaped pressure wound (6cmx4cmx3cm) muscle and bone exposed, areas of necrotic tissue at the edges, red & yellow inside w (Active)  06/17/20 2200  Location: Buttocks  Location Orientation: Right;Anterior (extending towards the sacrum)  Staging: Stage 4 - Full thickness tissue loss with exposed bone, tendon or muscle.  Wound Description (Comments): Deep Oval shaped pressure wound (6cmx4cmx3cm) muscle and bone exposed, areas of necrotic tissue at the edges, red & yellow inside w/active bleeding & tunneling, foul smelling  Present on Admission: Yes    Disposition.  At this time, patient is stable for disposition for skilled nursing facility. I spoke with the patient's daughter on the phone and updated her about clinical condition and plan for disposition.  Medical Consultants:   Palliative care Infectious disease General surgery  Procedures:    PICC line placment Subjective:   Today, patient seen and examined at bedside.  Denies overt pain.  Denies any nausea vomiting fever chills or rigor.  Discharge Exam:   Vitals:   09/01/20 2000 09/02/20 0711  BP: 126/71   Pulse:    Resp: 20   Temp: 98.4 F (  36.9 C)   SpO2: 94% 96%   Vitals:   09/01/20 1208 09/01/20 1943 09/01/20 2000 09/02/20 0711  BP: 124/80  126/71   Pulse: (!) 110     Resp: 18  20   Temp: 98.6 F (37 C)  98.4 F (36.9 C)   TempSrc: Oral  Oral   SpO2: 94% 95% 94% 96%  Weight:      Height:       General: Alert awake, not in obvious distress, communicative HENT: pupils equally reacting to light,   No scleral pallor or icterus noted. Oral mucosa is moist.  Chest:  Clear breath sounds.  Diminished breath sounds bilaterally. No crackles or wheezes.  CVS: S1 &S2 heard. No murmur.  Regular rate and rhythm. Abdomen: Soft, nontender, nondistended.  Bowel sounds are heard.   Extremities: No cyanosis, clubbing or edema.  Peripheral pulses are palpable.  Right upper extremity PICC line in place. Psych: Alert, awake and communicative normal mood CNS:  No cranial nerve deficits.  Power equal in all extremities.   Skin: Warm and dry, sacral decubitus ulceration stage IV present on admission.  The results of significant diagnostics from this hospitalization (including imaging, microbiology, ancillary and laboratory) are listed below for reference.     Diagnostic Studies:   CT ABDOMEN PELVIS W CONTRAST  Result Date: 08/11/2020 CLINICAL DATA:  Sacral wound EXAM: CT ABDOMEN AND PELVIS WITH CONTRAST TECHNIQUE: Multidetector CT imaging of the abdomen and pelvis was performed using the standard protocol following bolus administration of intravenous contrast. CONTRAST:  178m OMNIPAQUE IOHEXOL 300 MG/ML  SOLN COMPARISON:  June 16, 2020 FINDINGS: Lower chest: The visualized heart size within normal limits. No pericardial fluid/thickening. No hiatal hernia. The visualized portions of the lungs are clear. Hepatobiliary: The liver is normal in density without focal abnormality.The main portal vein is patent. No evidence of calcified gallstones, gallbladder wall thickening or biliary dilatation. Pancreas: Unremarkable. No pancreatic ductal dilatation or surrounding inflammatory changes. Spleen: Normal in size without focal abnormality. Adrenals/Urinary Tract: Both adrenal glands appear normal. The kidneys and collecting system appear normal without evidence of urinary tract calculus or hydronephrosis. Bladder is unremarkable. Stomach/Bowel: The stomach and small bowel are unremarkable. There is a large amount of  colonic stool seen throughout. A dilated rectum is seen with a fecal ball. Vascular/Lymphatic: There are no enlarged mesenteric, retroperitoneal, or pelvic lymph nodes. Scattered aortic atherosclerotic calcifications are seen without aneurysmal dilatation. Reproductive: Again noted is a heterogeneous fibroid uterus some with calcifications. Other: No evidence of abdominal wall mass or hernia. Musculoskeletal: There is a large sacral decubitus ulceration to the level of the coccyx with slight periosteal reaction in cortical irregularity at the inferior coccyx as the prior exam which could be due to chronic osteomyelitis. No loculated fluid collection is seen. IMPRESSION: Large sacral decubitus ulceration with cortical irregularity at the inferior coccyx which could be due to chronic osteomyelitis as on prior exam. No loculated fluid collections or evidence of acute osteomyelitis Large amount of colonic stool with a large fecal ball within the rectum. Aortic Atherosclerosis (ICD10-I70.0). Electronically Signed   By: BPrudencio PairM.D.   On: 08/11/2020 19:31   DG Chest Port 1 View  Result Date: 08/11/2020 CLINICAL DATA:  Sacral wound.  Sepsis. EXAM: PORTABLE CHEST 1 VIEW COMPARISON:  06/26/2020 FINDINGS: The cardiac silhouette, mediastinal and hilar contours are within normal limits and stable. There are stable chronic bronchitic type lung changes and emphysema. No focal pulmonary infiltrates or pleural effusions. Stable eventration  of the right hemidiaphragm. The bony thorax is intact. IMPRESSION: 1. Chronic lung changes and emphysema. 2. No acute pulmonary findings. Electronically Signed   By: Marijo Sanes M.D.   On: 08/11/2020 13:19   VAS [place] LOWER EXTREMITY VENOUS (DVT)  Result Date: 08/12/2020  Lower Venous DVT Study Indications: Swelling.  Risk Factors: None identified. Comparison Study: No prior studies. Performing Technologist: Oliver Hum RVT  Examination Guidelines: A complete evaluation includes  B-mode imaging, spectral Doppler, color Doppler, and power Doppler as needed of all accessible portions of each vessel. Bilateral testing is considered an integral part of a complete examination. Limited examinations for reoccurring indications may be performed as noted. The reflux portion of the exam is performed with the patient in reverse Trendelenburg.  +---------+---------------+---------+-----------+----------+--------------+ RIGHT    CompressibilityPhasicitySpontaneityPropertiesThrombus Aging +---------+---------------+---------+-----------+----------+--------------+ CFV      Full           Yes      Yes                                 +---------+---------------+---------+-----------+----------+--------------+ SFJ      Full                                                        +---------+---------------+---------+-----------+----------+--------------+ FV Prox  Full                                                        +---------+---------------+---------+-----------+----------+--------------+ FV Mid   Full                                                        +---------+---------------+---------+-----------+----------+--------------+ FV DistalFull                                                        +---------+---------------+---------+-----------+----------+--------------+ PFV      Full                                                        +---------+---------------+---------+-----------+----------+--------------+ POP      Full           Yes      Yes                                 +---------+---------------+---------+-----------+----------+--------------+ PTV      Full                                                        +---------+---------------+---------+-----------+----------+--------------+  PERO     Full                                                         +---------+---------------+---------+-----------+----------+--------------+   +----+---------------+---------+-----------+----------+--------------+ LEFTCompressibilityPhasicitySpontaneityPropertiesThrombus Aging +----+---------------+---------+-----------+----------+--------------+ CFV Full           Yes      Yes                                 +----+---------------+---------+-----------+----------+--------------+     Summary: RIGHT: - There is no evidence of deep vein thrombosis in the lower extremity.  - No cystic structure found in the popliteal fossa.  LEFT: - No evidence of common femoral vein obstruction.  *See table(s) above for measurements and observations. Electronically signed by Deitra Mayo MD on 08/12/2020 at 3:48:32 PM.    Final      Labs:   Basic Metabolic Panel: Recent Labs  Lab 08/27/20 0418 08/28/20 0305 08/29/20 0426 08/30/20 0000 08/31/20 0447 09/01/20 0522 09/02/20 0345  NA 139  --  140  --  140  --  140  K 4.0  --  3.6  --  3.5  --  3.6  CL 103  --  109  --  106  --  107  CO2 26  --  22  --  24  --  24  GLUCOSE 104*  --  90  --  96  --  93  BUN 11  --  7  --  10  --  18  CREATININE 0.35* 0.46 0.34* 0.40* 0.49 0.41* 0.43*  CALCIUM 9.3  --  8.6*  --  8.9  --  9.0  MG 1.7 1.5* 1.8 1.5* 1.9 1.7 1.7  PHOS 3.1 2.9  --   --   --   --   --    GFR Estimated Creatinine Clearance: 80.4 mL/min (A) (by C-G formula based on SCr of 0.43 mg/dL (L)). Liver Function Tests: Recent Labs  Lab 08/27/20 0418  AST 11*  ALT 19  ALKPHOS 98  BILITOT 0.6  PROT 5.9*  ALBUMIN 2.3*   No results for input(s): LIPASE, AMYLASE in the last 168 hours. No results for input(s): AMMONIA in the last 168 hours. Coagulation profile No results for input(s): INR, PROTIME in the last 168 hours.  CBC: Recent Labs  Lab 08/27/20 0418 08/29/20 0426 08/30/20 0000 08/31/20 0447 09/02/20 0345  WBC 15.3* 11.8* 13.5* 10.9* 9.8  NEUTROABS 11.3*  --   --   --   --   HGB  7.7* 6.9* 10.0* 10.1* 10.4*  HCT 25.4* 22.6* 31.5* 31.6* 33.1*  MCV 79.1* 79.9* 81.0 80.8 82.8  PLT 736* 623* 592* 623* 609*   Cardiac Enzymes: No results for input(s): CKTOTAL, CKMB, CKMBINDEX, TROPONINI in the last 168 hours. BNP: Invalid input(s): POCBNP CBG: Recent Labs  Lab 09/01/20 1633 09/01/20 2021 09/01/20 2343 09/02/20 0451 09/02/20 0724  GLUCAP 135* 122* 116* 105* 90   D-Dimer No results for input(s): DDIMER in the last 72 hours. Hgb A1c No results for input(s): HGBA1C in the last 72 hours. Lipid Profile No results for input(s): CHOL, HDL, LDLCALC, TRIG, CHOLHDL, LDLDIRECT in the last 72 hours. Thyroid function studies No results for input(s): TSH, T4TOTAL, T3FREE, THYROIDAB in  the last 72 hours.  Invalid input(s): FREET3 Anemia work up No results for input(s): VITAMINB12, FOLATE, FERRITIN, TIBC, IRON, RETICCTPCT in the last 72 hours. Microbiology Recent Results (from the past 240 hour(s))  SARS CORONAVIRUS 2 (TAT 6-24 HRS) Nasopharyngeal Nasopharyngeal Swab     Status: None   Collection Time: 08/26/20  3:30 PM   Specimen: Nasopharyngeal Swab  Result Value Ref Range Status   SARS Coronavirus 2 NEGATIVE NEGATIVE Final    Comment: (NOTE) SARS-CoV-2 target nucleic acids are NOT DETECTED.  The SARS-CoV-2 RNA is generally detectable in upper and lower respiratory specimens during the acute phase of infection. Negative results do not preclude SARS-CoV-2 infection, do not rule out co-infections with other pathogens, and should not be used as the sole basis for treatment or other patient management decisions. Negative results must be combined with clinical observations, patient history, and epidemiological information. The expected result is Negative.  Fact Sheet for Patients: SugarRoll.be  Fact Sheet for Healthcare Providers: https://www.woods-mathews.com/  This test is not yet approved or cleared by the [place]  FDA and  has been authorized for detection and/or diagnosis of SARS-CoV-2 by FDA under an Emergency Use Authorization (EUA). This EUA will remain  in effect (meaning this test can be used) for the duration of the COVID-19 declaration under Se ction 564(b)(1) of the Act, 21 U.S.C. section 360bbb-3(b)(1), unless the authorization is terminated or revoked sooner.  Performed at [name], [place] [address], [place], [place] [address]   SARS CORONAVIRUS 2 (TAT 6-24 HRS) Nasopharyngeal Nasopharyngeal Swab     Status: None   Collection Time: 08/30/20  8:53 PM   Specimen: Nasopharyngeal Swab  Result Value Ref Range Status   SARS Coronavirus 2 NEGATIVE NEGATIVE Final    Comment: (NOTE) SARS-CoV-2 target nucleic acids are NOT DETECTED.  The SARS-CoV-2 RNA is generally detectable in upper and lower respiratory specimens during the acute phase of infection. Negative results do not preclude SARS-CoV-2 infection, do not rule out co-infections with other pathogens, and should not be used as the sole basis for treatment or other patient management decisions. Negative results must be combined with clinical observations, patient history, and epidemiological information. The expected result is Negative.  Fact Sheet for Patients: SugarRoll.be  Fact Sheet for Healthcare Providers: https://www.woods-mathews.com/  This test is not yet approved or cleared by the [place] FDA and  has been authorized for detection and/or diagnosis of SARS-CoV-2 by FDA under an Emergency Use Authorization (EUA). This EUA will remain  in effect (meaning this test can be used) for the duration of the COVID-19 declaration under Se ction 564(b)(1) of the Act, 21 U.S.C. section 360bbb-3(b)(1), unless the authorization is terminated or revoked sooner.  Performed at [facility], [place] [address], [place], [place] [address]      Discharge Instructions:    Discharge Instructions     [facility] pharmacist to adjust dose for Vancomycin, Aminoglycosides and other anti-infective therapies as requested by physician.   Complete by: As directed    [facility] to provide Cath Flo 30m   Complete by: As directed    Administer for PICC line occlusion and as ordered by physician for other access device issues.   Anaphylaxis Kit: Provided to treat any anaphylactic reaction to the medication being provided to the patient if First Dose or when requested by physician   Complete by: As directed    Epinephrine 1319mml vial / amp: Administer 0.19m48m0.19ml34mubcutaneously once  for moderate to severe anaphylaxis, nurse to call physician and pharmacy when reaction occurs and call 911 if needed for immediate care   Diphenhydramine 79m/ml IV vial: Administer 25-532mIV/IM PRN for first dose reaction, rash, itching, mild reaction, nurse to call physician and pharmacy when reaction occurs   Sodium Chloride 0.9% NS 50077mV: Administer if needed for hypovolemic blood pressure drop or as ordered by physician after call to physician with anaphylactic reaction   Call MD for:  persistant nausea and vomiting   Complete by: As directed    Call MD for:  severe uncontrolled pain   Complete by: As directed    Call MD for:  temperature >100.4   Complete by: As directed    Change dressing on IV access line weekly and PRN   Complete by: As directed    Diet - low sodium heart healthy   Complete by: As directed    Soft diet   Discharge instructions   Complete by: As directed    Follow-up with your primary care provider at the skilled nursing facility in 3 to 5 days.  Follow-up with Dr. [facility] on 09[facility]17[facility]20 at 1530.   Discharge wound care:   Complete by: As directed    Wound care to Stage 4 pressure injury to sacrum (POA):  Cleanse wound and surrounding skin with NS, pat dry. Fill defect with saline moistened Kerlix roll gauze,  top with dry gauze 4x4s, ABD pad and secure with tape.  Change twice daily. And PRN soiling.  Turn side to side and minimize time in the supine position.   Flush IV access with Sodium Chloride 0.9% and Heparin 10 units/ml or 100 units/ml   Complete by: As directed    Home infusion instructions - [facility]   Complete by: As directed    Instructions: Flush IV access with Sodium Chloride 0.9% and Heparin 10units/ml or 100units/ml   Change dressing on IV access line: Weekly and PRN   Instructions Cath Flo 2mg72mdminister for PICC Line occlusion and as ordered by physician for other access device   [facility] pharmacist to adjust dose for: Vancomycin, Aminoglycosides and other anti-infective therapies as requested by physician   Increase activity slowly   Complete by: As directed    Method of administration may be changed at the discretion of home infusion pharmacist based upon assessment of the patient and/or caregiver's ability to self-administer the medication ordered   Complete by: As directed       Allergies as of 09/02/2020       Reactions   No Known Allergies         Medication List     STOP taking these medications    oxyCODONE-acetaminophen 10-325 MG tablet Commonly known as: PERCOCET   Protein Powd       TAKE these medications    acetaminophen 325 MG tablet Commonly known as: TYLENOL Take 650 mg by mouth every 6 (six) hours as needed for fever (100.4 and above).   Aspirin Low Dose 81 MG EC tablet Generic drug: aspirin Take 81 mg by mouth daily.   benzonatate 100 MG capsule Commonly known as: TESSALON Take 100 mg by mouth 3 (three) times daily.   bisacodyl 10 MG suppository Commonly known as: DULCOLAX Place 1 suppository (10 mg total) rectally daily as needed for moderate constipation or severe constipation.   budesonide-formoterol 80-4.5 MCG/ACT inhaler Commonly known as: SYMBICORT Inhale 2 puffs into the lungs daily.  Calmoseptine 0.44-20.6 % Oint Generic drug: Menthol-Zinc Oxide Apply 1 application topically 3 (three) times daily as needed (skin irritation).   dalfampridine 10 MG Tb12 Take 1 tablet (10 mg total) by mouth 2 (two) times daily.   diltiazem 120 MG 24 hr capsule Commonly known as: Cardizem CD Take 1 capsule (120 mg total) by mouth daily.   ENSURE PO Take 120 mLs by mouth See admin instructions. 3 times daily with med pass   nutrition supplement (JUVEN) Pack Take 1 packet by mouth 2 (two) times daily between meals. Mix with 120 ml of water or juice   ertapenem  IVPB Commonly known as: INVANZ Inject 1 g into the vein daily for 21 days. Indication:  Chronic sacral osteomyelitis First Dose: Yes Last Day of Therapy:  09/11/20 Labs - Once weekly:  CBC/D and BMP, Labs - Every other week:  ESR and CRP Method of administration: Mini-Bag Plus / Gravity Method of administration may be changed at the discretion of home infusion pharmacist based upon assessment of the patient and/or caregiver's ability to self-administer the medication ordered.   FeroSul 325 (65 FE) MG tablet Generic drug: ferrous sulfate Take 325 mg by mouth 2 (two) times daily.   magnesium oxide 400 (241.3 Mg) MG tablet Commonly known as: MAG-OX Take 1 tablet (400 mg total) by mouth daily for 10 days.   metoprolol succinate 100 MG 24 hr tablet Commonly known as: TOPROL-XL Take 100 mg by mouth daily.   Multivitamin Adult Tabs Take 1 tablet by mouth daily.   naloxegol oxalate 25 MG Tabs tablet Commonly known as: MOVANTIK Take 25 mg by mouth daily.   Naloxone HCl 0.4 MG/0.4ML Soaj Use as directed if unable to arouse the patient   nystatin 100000 UNIT/ML suspension Commonly known as: MYCOSTATIN Take 5 mLs (500,000 Units total) by mouth 4 (four) times daily for 14 days.   oxybutynin 10 MG 24 hr tablet Commonly known as: DITROPAN-XL Take 10 mg by mouth daily.   oxyCODONE 5 MG immediate release tablet Commonly  known as: Oxy IR/ROXICODONE Take 1 tablet (5 mg total) by mouth every 6 (six) hours as needed for moderate pain or severe pain. What changed:  medication strength how much to take when to take this reasons to take this additional instructions   pantoprazole 40 MG tablet Commonly known as: PROTONIX Take 1 tablet (40 mg total) by mouth 2 (two) times daily before a meal.   polyethylene glycol 17 g packet Commonly known as: MIRALAX / GLYCOLAX Take 17 g by mouth daily as needed for mild constipation or moderate constipation.   potassium chloride 10 MEQ CR capsule Commonly known as: MICRO-K Take 10 mEq by mouth 2 (two) times daily.   senna-docusate 8.6-50 MG tablet Commonly known as: Senokot-S Take 1 tablet by mouth at bedtime.   tiZANidine 4 MG tablet Commonly known as: ZANAFLEX TAKE TWO (2) TABLETS THREE (3) TIMES DAILY What changed: See the new instructions.   Trulicity 1.5 XI/5.0TU Sopn Generic drug: Dulaglutide Inject 1.5 mg into the skin every Friday.   vitamin C 1000 MG tablet Take 1,000 mg by mouth in the morning and at bedtime.   zinc gluconate 50 MG tablet Take 50 mg by mouth daily.               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 Fill defect with saline moistened Kerlix roll gauze, top with dry gauze 4x4s, ABD pad and secure with tape.  Change twice daily. And PRN soiling.  Turn side to side and minimize time in the supine position.   09/02/20 0826   08/27/20 0000  Change dressing on IV access line weekly and PRN  (Home infusion instructions - [facility] )        08/27/20 [place], [facility] .   Contact information: [place] [address] [phone]          Rosiland Oz, MD. Go on 09/17/2020.   Specialty: Infectious Diseases Why: Real information: [address] [place] [address] [phone]                 Time coordinating discharge: 39 minutes  Signed:  Maxx Pham  [facility] 09/02/2020, 8:28 AM

## 2020-09-02 NOTE — TOC Transition Note (Addendum)
Transition of Care [facility]) - CM/SW Discharge Note   Patient Details  Name: Tina Griffith MRN: [id number] Date of Birth: 1960/10/02  [facility]) CM/SW Contact:  [name], RN Phone Number: 09/02/2020, 10:47 AM   Clinical Narrative: dc today to [facility]-faxed d/c summary, & covid results-rep [name] abel to accept-going to rm#[id number],nsg call report tel#[phone]. [facility] for transport-Nsg to inform CM when ready to call [facility].   12:30p-[facility] called. No further CM needs.    Final next level of care: Skilled Nursing Facility Barriers to Discharge: No Barriers Identified   Patient Goals and CMS Choice Patient states their goals for this hospitalization and ongoing recovery are:: Per dtr do not want to return back to [facility] a LTC at another facility. CMS Medicare.gov Compare Post Acute Care list provided to:: Patient Represenative (must comment) (dtr [name])    Discharge Placement PASRR number recieved: 08/30/20            Patient chooses bed at: Other - please specify in the comment section below: [facility]) Patient to be transferred to facility by: [place] Name of family member notified: [name] dtr [phone] Patient and family notified of of transfer: 09/02/20  Discharge Plan and Services   Discharge Planning Services: CM Consult                                 Social Determinants of Health ([place]) Interventions     Readmission Risk Interventions Readmission Risk Prevention Plan 06/18/2020  Transportation Screening Complete  PCP or Specialist Appt within 5-7 Days Complete  Home Care Screening Complete  Medication Review (RN CM) Complete  Some recent data might be hidden

## 2020-09-02 NOTE — Progress Notes (Signed)
Call placed to patients daughter [place], no answer.

## 2020-09-02 NOTE — Progress Notes (Addendum)
Call placed to facility [phone], [place].

## 2020-09-02 NOTE — Progress Notes (Signed)
[facility] has arrived for Patient transport. No change in am assessment. Pt is stable. Pt a&ox4,  mobilized with 2 assist.

## 2020-09-17 ENCOUNTER — Other Ambulatory Visit: Payer: Self-pay

## 2020-09-17 ENCOUNTER — Ambulatory Visit (INDEPENDENT_AMBULATORY_CARE_PROVIDER_SITE_OTHER): Payer: Medicare Other | Admitting: Infectious Diseases

## 2020-09-17 DIAGNOSIS — L89159 Pressure ulcer of sacral region, unspecified stage: Secondary | ICD-10-CM | POA: Diagnosis not present

## 2020-09-17 DIAGNOSIS — G35 Multiple sclerosis: Secondary | ICD-10-CM | POA: Diagnosis not present

## 2020-09-17 DIAGNOSIS — M4628 Osteomyelitis of vertebra, sacral and sacrococcygeal region: Secondary | ICD-10-CM | POA: Diagnosis not present

## 2020-09-17 DIAGNOSIS — Z5181 Encounter for therapeutic drug level monitoring: Secondary | ICD-10-CM

## 2020-09-17 NOTE — Progress Notes (Signed)
Written orders sent with Patient to return to facility.  D/c picc line and IV antibiotics. Continue wound care, off loading and follow up with Neurology. Copies place in batch for scanning.  Tina Griffith

## 2020-09-17 NOTE — Progress Notes (Signed)
[facility]                                                             [place], [place], [place], [address]                                                                  Phn. [phone]; Fax: [phone]                                                                             Date: 09/17/20  Reason for Referral: Sacral Osteomyelitis  Requesting  Provider: [facility], PCP   Assessment Chronic Sacral Decubitus Ulcer/Chronic Sacral Osteomyelitis Bedbound state MS/CVA Medication monitoring   Note : endemic fungal serology negative  Plan Patient has already completed more than 6 weeks of IV antibiotics for Sacral Osteomyelitis ( End date of IV ertapenem was 09/11/20). Sacral ulcer is clean with pink granulation tissue. No further indication of antibiotics. Discussed with daughter regarding proper wound care, off loading measures. Unfortunately, this will not likely heal given patient is bedridden.  Will request labs from [facility] for medication monitoring  Follow up with Neurology for management of MS Fu as needed with [place]  All questions and concerns were discussed and addressed. Patient verbalized understanding of the plan. ____________________________________________________________________________________________________________________  HPI: 60 year old female with past medical history of multiple sclerosis/CVA s/p left hemiplegia, type II DM, hypertension, IBS, COVID-pneumonia in December 2021 with resultant bedbound status, sacral decubitus ulcer who is referred in the setting of sacral decubitus ulcer.  She was hospitalised in early feb 2022 for sepsis thought 2/2 to sacral DU s/p debridement end of January.  She was treated with IV/PO abx for what appears a total of 2 wk. She did have 1/2 blood cx for bacteroids ovatus. She was discharged on 2/14 with no further abtx but  to continue with wound care at [facility].  Patient was re-  admitted in the hospital 3/21-4/12/22 with fever, drainage from the sacral wound, and altered mental status and suspected neglect at the SNF.  CT abdomen pelvis 3/21 showed*large sacral decubitus ulcer with chronic osteomyelitis.  No surgical intervention performed per general surgery.  Patient was started on IV antibiotics which was eventually changed to IV ertapenem with a plan to continue for 6 weeks with end date 09/11/2020 after placement of PICC line.  WBC nuclear scan with focal infection of the coccygeal region.  Patient was also seen by palliative, patient was deemed to be in full code and plan was to continue IV antibiotics and wound care at SNF per patient's wish. Patient had an extensive work for FUO while hospitalized and was unremarkable including negative blood and urine cultures, negative fungal serologies, negative DVT in UE and LE,  negative TTE and CTA chest.  Patient is accompanied by a staff at the SNF and her daughter. Per daughter, she thinks patient might have developed Sacral ulcer after her hospitalization at [facility] center in December 2021 for [place]. It was initially small in the beginning and later continued to enlarge. She says patient has not been ambulatory due to MS and she follows with Dr Ermalene Postin  at [facility] ( Neurology) and is supposed to fu in may 18th. She is receiving IVIG for MS. Patient denies any fevers, chills and sweats. Denies any issues with the antibiotics like nausea/vomiting/diarrhea and rashes. Denies any issues with the PICC line. She is still getting IV antibiotics through the PICC line. Sacral wound on exam has pink granulation tissue with no areas concerning for infection.   ROS: 12 point ROS done with pertinent positives and negatives listed above    Past Medical History:  Diagnosis Date  . Constipation   . Hypertension   . Movement disorder   . MS (multiple sclerosis) ([place])   . Vision  abnormalities    Past Surgical History:  Procedure Laterality Date  . COLOSTOMY    . COLOSTOMY REVERSAL    . HERNIA REPAIR    . KNEE SURGERY    . WOUND DEBRIDEMENT Right 06/19/2020   Procedure: DEBRIDEMENT OF SACRAL WOUND;  Surgeon: Armandina Gemma, MD;  Location: [facility];  Service: General;  Laterality: Right;   Current Outpatient Medications on File Prior to Visit  Medication Sig Dispense Refill  . acetaminophen (TYLENOL) 325 MG tablet Take 650 mg by mouth every 6 (six) hours as needed for fever (100.4 and above).    . Ascorbic Acid (VITAMIN C) 1000 MG tablet Take 1,000 mg by mouth in the morning and at bedtime.    . ASPIRIN LOW DOSE 81 MG EC tablet Take 81 mg by mouth daily.    . benzonatate (TESSALON) 100 MG capsule Take 100 mg by mouth 3 (three) times daily.    . bisacodyl (DULCOLAX) 10 MG suppository Place 1 suppository (10 mg total) rectally daily as needed for moderate constipation or severe constipation.    . budesonide-formoterol (SYMBICORT) 80-4.5 MCG/ACT inhaler Inhale 2 puffs into the lungs daily.    Marland Kitchen CALMOSEPTINE 0.44-20.6 % OINT Apply 1 application topically 3 (three) times daily as needed (skin irritation).    Marland Kitchen dalfampridine 10 MG TB12 Take 1 tablet (10 mg total) by mouth 2 (two) times daily. 60 tablet 11  . diltiazem (CARDIZEM CD) 120 MG 24 hr capsule Take 1 capsule (120 mg total) by mouth daily.    . Dulaglutide (TRULICITY) 1.5 WI/2.0BT SOPN Inject 1.5 mg into the skin every Friday.    . ertapenem (INVANZ) IVPB Inject 1 g into the vein daily for 21 days. Indication:  Chronic sacral osteomyelitis First Dose: Yes Last Day of Therapy:  09/11/20 Labs - Once weekly:  CBC/D and BMP, Labs - Every other week:  ESR and CRP Method of administration: Mini-Bag Plus / Gravity Method of administration may be changed at the discretion of home infusion pharmacist based upon assessment of the patient and/or caregiver's ability to self-administer the medication ordered. 21 Units 0  .  FEROSUL 325 (65 Fe) MG tablet Take 325 mg by mouth 2 (two) times daily.    . metoprolol succinate (TOPROL-XL) 100 MG 24 hr tablet Take 100 mg by mouth daily.    . Multiple Vitamin (MULTIVITAMIN ADULT) TABS Take 1 tablet by mouth daily.    Marland Kitchen  naloxegol oxalate (MOVANTIK) 25 MG TABS tablet Take 25 mg by mouth daily.    . Naloxone HCl 0.4 MG/0.4ML SOAJ Use as directed if unable to arouse the patient 0.4 mL 2  . nutrition supplement, JUVEN, (JUVEN) PACK Take 1 packet by mouth 2 (two) times daily between meals. Mix with 120 ml of water or juice    . Nutritional Supplements (ENSURE PO) Take 120 mLs by mouth See admin instructions. 3 times daily with med pass    . oxybutynin (DITROPAN-XL) 10 MG 24 hr tablet Take 10 mg by mouth daily.    Marland Kitchen oxyCODONE (OXY IR/ROXICODONE) 5 MG immediate release tablet Take 1 tablet (5 mg total) by mouth every 6 (six) hours as needed for moderate pain or severe pain. 10 tablet 0  . pantoprazole (PROTONIX) 40 MG tablet Take 1 tablet (40 mg total) by mouth 2 (two) times daily before a meal.    . polyethylene glycol (MIRALAX / GLYCOLAX) 17 g packet Take 17 g by mouth daily as needed for mild constipation or moderate constipation.  0  . potassium chloride (MICRO-K) 10 MEQ CR capsule Take 10 mEq by mouth 2 (two) times daily.    Marland Kitchen senna-docusate (SENOKOT-S) 8.6-50 MG tablet Take 1 tablet by mouth at bedtime.    Marland Kitchen tiZANidine (ZANAFLEX) 4 MG tablet TAKE TWO (2) TABLETS THREE (3) TIMES DAILY (Patient taking differently: Take 8 mg by mouth 3 (three) times daily.) 180 tablet 5  . zinc gluconate 50 MG tablet Take 50 mg by mouth daily.     No current facility-administered medications on file prior to visit.   Allergies  Allergen Reactions  . No Known Allergies    Social History   Socioeconomic History  . Marital status: Single    Spouse name: Not on file  . Number of children: Not on file  . Years of education: Not on file  . Highest education level: Not on file  Occupational  History  . Not on file  Tobacco Use  . Smoking status: Current Every Day Smoker    Packs/day: 1.00    Types: Cigarettes  . Smokeless tobacco: Never Used  Substance and Sexual Activity  . Alcohol use: No    Alcohol/week: 0.0 standard drinks  . Drug use: No  . Sexual activity: Not on file  Other Topics Concern  . Not on file  Social History Narrative  . Not on file   Social Determinants of Health   Financial Resource Strain: Not on file  Food Insecurity: Not on file  Transportation Needs: Not on file  Physical Activity: Not on file  Stress: Not on file  Social Connections: Not on file  Intimate Partner Violence: Not on file   Family history - denies  Vitals There were no vitals taken for this visit.   Examination  General - not in acute distress, lying in s stretcher covered with a blanket HEENT - PEERLA, no pallor and no icterus, missing teeth Chest - b/l clear air entry, no additional sounds CVS- Normal s1s2, RRR Abdomen - Soft, Non tender , non distended Ext- no pedal edema Neuro: Left sided weakness,  Back -   Psych : calm and cooperative   Recent labs CBC Latest Ref Rng & Units 09/02/2020 08/31/2020 08/30/2020  WBC 4.0 - 10.5 K/uL 9.8 10.9(H) 13.5(H)  Hemoglobin 12.0 - 15.0 g/dL 10.4(L) 10.1(L) 10.0(L)  Hematocrit 36.0 - 46.0 % 33.1(L) 31.6(L) 31.5(L)  Platelets 150 - 400 K/uL 609(H) 623(H) 592(H)  CMP Latest Ref Rng & Units 09/02/2020 09/01/2020 08/31/2020  Glucose 70 - 99 mg/dL 93 - 96  BUN 6 - 20 mg/dL 18 - 10  Creatinine 0.44 - 1.00 mg/dL 0.43(L) 0.41(L) 0.49  Sodium 135 - 145 mmol/L 140 - 140  Potassium 3.5 - 5.1 mmol/L 3.6 - 3.5  Chloride 98 - 111 mmol/L 107 - 106  CO2 22 - 32 mmol/L 24 - 24  Calcium 8.9 - 10.3 mg/dL 9.0 - 8.9  Total Protein 6.5 - 8.1 g/dL - - -  Total Bilirubin 0.3 - 1.2 mg/dL - - -  Alkaline Phos 38 - 126 U/L - - -  AST 15 - 41 U/L - - -  ALT 0 - 44 U/L - - -     Pertinent Microbiology Results for orders placed or performed  during the hospital encounter of 08/11/20  Urine Culture     Status: None   Collection Time: 08/11/20 12:33 PM   Specimen: In/Out Cath Urine  Result Value Ref Range Status   Specimen Description IN/OUT CATH URINE  Final   Special Requests NONE  Final   Culture   Final    NO GROWTH Performed at [facility], [address], [place], [place] [address]    Report Status 08/14/2020 FINAL  Final  Blood Culture (routine x 2)     Status: None   Collection Time: 08/11/20 12:40 PM   Specimen: BLOOD LEFT HAND  Result Value Ref Range Status   Specimen Description BLOOD LEFT HAND  Final   Special Requests   Final    BOTTLES DRAWN AEROBIC ONLY Blood Culture results may not be optimal due to an inadequate volume of blood received in culture bottles   Culture   Final    NO GROWTH 5 DAYS Performed at [facility], [place] [address] [place], [place] [address]    Report Status 08/16/2020 FINAL  Final  Blood Culture (routine x 2)     Status: None   Collection Time: 08/11/20 12:41 PM   Specimen: BLOOD RIGHT ARM  Result Value Ref Range Status   Specimen Description BLOOD RIGHT ARM  Final   Special Requests   Final    BOTTLES DRAWN AEROBIC AND ANAEROBIC Blood Culture adequate volume   Culture   Final    NO GROWTH 5 DAYS Performed at [facility], [place] [address] [place], [place] [address]    Report Status 08/16/2020 FINAL  Final  Resp Panel by RT-PCR (Flu A&B, Covid) Nasopharyngeal Swab     Status: None   Collection Time: 08/11/20  1:10 PM   Specimen: Nasopharyngeal Swab; Nasopharyngeal(NP) swabs in vial transport medium  Result Value Ref Range Status   SARS Coronavirus 2 by RT PCR NEGATIVE NEGATIVE Final    Comment: (NOTE) SARS-CoV-2 target nucleic acids are NOT DETECTED.  The SARS-CoV-2 RNA is generally detectable in upper respiratory specimens during the acute phase of infection. The lowest concentration of SARS-CoV-2 viral copies this assay can detect is 138 copies/mL. A  negative result does not preclude SARS-Cov-2 infection and should not be used as the sole basis for treatment or other patient management decisions. A negative result may occur with  improper specimen collection/handling, submission of specimen other than nasopharyngeal swab, presence of viral mutation(s) within the areas targeted by this assay, and inadequate number of viral copies(<138 copies/mL). A negative result must be combined with clinical observations, patient history, and epidemiological information. The expected result is Negative.  Fact Sheet for Patients:  EntrepreneurPulse.com.au  Fact Sheet for Healthcare Providers:  IncredibleEmployment.be  This test is no t yet approved or cleared by the [place] FDA and  has been authorized for detection and/or diagnosis of SARS-CoV-2 by FDA under an Emergency Use Authorization (EUA). This EUA will remain  in effect (meaning this test can be used) for the duration of the COVID-19 declaration under Section 564(b)(1) of the Act, 21 U.S.C.section 360bbb-3(b)(1), unless the authorization is terminated  or revoked sooner.       Influenza A by PCR NEGATIVE NEGATIVE Final   Influenza B by PCR NEGATIVE NEGATIVE Final    Comment: (NOTE) The Xpert Xpress SARS-CoV-2/FLU/RSV plus assay is intended as an aid in the diagnosis of influenza from Nasopharyngeal swab specimens and should not be used as a sole basis for treatment. Nasal washings and aspirates are unacceptable for Xpert Xpress SARS-CoV-2/FLU/RSV testing.  Fact Sheet for Patients: EntrepreneurPulse.com.au  Fact Sheet for Healthcare Providers: IncredibleEmployment.be  This test is not yet approved or cleared by the [place] FDA and has been authorized for detection and/or diagnosis of SARS-CoV-2 by FDA under an Emergency Use Authorization (EUA). This EUA will remain in effect (meaning this test can  be used) for the duration of the COVID-19 declaration under Section 564(b)(1) of the Act, 21 U.S.C. section 360bbb-3(b)(1), unless the authorization is terminated or revoked.  Performed at [facility], [place] [address], [place], [place] [address]   MRSA PCR Screening     Status: None   Collection Time: 08/12/20 12:29 PM   Specimen: Nasal Mucosa; Nasopharyngeal  Result Value Ref Range Status   MRSA by PCR NEGATIVE NEGATIVE Final    Comment:        The GeneXpert MRSA Assay (FDA approved for NASAL specimens only), is one component of a comprehensive MRSA colonization surveillance program. It is not intended to diagnose MRSA infection nor to guide or monitor treatment for MRSA infections. Performed at [facility], [place] [address], [place], [place] [address]   Urine culture     Status: None   Collection Time: 08/12/20  1:34 PM   Specimen: Urine, Catheterized  Result Value Ref Range Status   Specimen Description   Final    URINE, CATHETERIZED Performed at [facility] [address], [place], [place] [address]    Special Requests   Final    NONE Performed at [facility], [place] [address], [place], [place] [address]    Culture   Final    NO GROWTH Performed at [facility], [place] [address], [place], [place] [address]    Report Status 08/13/2020 FINAL  Final  Culture, blood (routine x 2)     Status: None   Collection Time: 08/15/20  3:20 PM   Specimen: BLOOD RIGHT HAND  Result Value Ref Range Status   Specimen Description   Final    BLOOD RIGHT HAND Performed at [facility] [address], [place], [place] [address]    Special Requests   Final    BOTTLES DRAWN AEROBIC ONLY Blood Culture adequate volume Performed at [facility] [address] [place], [place] [address]    Culture   Final    NO GROWTH 5 DAYS Performed at [facility], [place] [address],  [place],  [address]    Report Status 08/20/2020 FINAL  Final  Culture, blood (routine x 2)  Status: None   Collection Time: 08/15/20  3:20 PM   Specimen: BLOOD LEFT HAND  Result Value Ref Range Status   Specimen Description   Final    BLOOD LEFT HAND Performed at [facility] [address], [place], [place] [address]    Special Requests   Final    BOTTLES DRAWN AEROBIC ONLY Blood Culture adequate volume Performed at [facility] [address], [place], [place] [address]    Culture   Final    NO GROWTH 5 DAYS Performed at [facility], [place] [address] [place], [place] [address]    Report Status 08/20/2020 FINAL  Final  Blastomyces Antigen     Status: None   Collection Time: 08/21/20 12:08 PM   Specimen: Blood  Result Value Ref Range Status   Blastomyces Antigen None Detected None Detected ng/mL Final    Comment: (NOTE) Results reported as ng/mL in 0.2 - 14.7 ng/mL range Results above the limit of detection but below 0.2 ng/mL are reported as 'Positive, Below the Limit of Quantification' Results above 14.7 ng/mL are reported as 'Positive, Above the Limit of Quantification'    Specimen Type SERUM  Final    Comment: (NOTE) Performed At: [place], [place] [id number] [place] MD VW:[id number]   SARS CORONAVIRUS 2 (TAT 6-24 HRS) Nasopharyngeal Nasopharyngeal Swab     Status: None   Collection Time: 08/26/20  3:30 PM   Specimen: Nasopharyngeal Swab  Result Value Ref Range Status   SARS Coronavirus 2 NEGATIVE NEGATIVE Final    Comment: (NOTE) SARS-CoV-2 target nucleic acids are NOT DETECTED.  The SARS-CoV-2 RNA is generally detectable in upper and lower respiratory specimens during the acute phase of infection. Negative results do not preclude SARS-CoV-2 infection, do not rule out co-infections with other pathogens, and should not be used as the sole basis for treatment or  other patient management decisions. Negative results must be combined with clinical observations, patient history, and epidemiological information. The expected result is Negative.  Fact Sheet for Patients: SugarRoll.be  Fact Sheet for Healthcare Providers: https://www.woods-mathews.com/  This test is not yet approved or cleared by the [place] FDA and  has been authorized for detection and/or diagnosis of SARS-CoV-2 by FDA under an Emergency Use Authorization (EUA). This EUA will remain  in effect (meaning this test can be used) for the duration of the COVID-19 declaration under Se ction 564(b)(1) of the Act, 21 U.S.C. section 360bbb-3(b)(1), unless the authorization is terminated or revoked sooner.  Performed at [facility], [place] [address] [place], [place] [address]   SARS CORONAVIRUS 2 (TAT 6-24 HRS) Nasopharyngeal Nasopharyngeal Swab     Status: None   Collection Time: 08/30/20  8:53 PM   Specimen: Nasopharyngeal Swab  Result Value Ref Range Status   SARS Coronavirus 2 NEGATIVE NEGATIVE Final    Comment: (NOTE) SARS-CoV-2 target nucleic acids are NOT DETECTED.  The SARS-CoV-2 RNA is generally detectable in upper and lower respiratory specimens during the acute phase of infection. Negative results do not preclude SARS-CoV-2 infection, do not rule out co-infections with other pathogens, and should not be used as the sole basis for treatment or other patient management decisions. Negative results must be combined with clinical observations, patient history, and epidemiological information. The expected result is Negative.  Fact Sheet for Patients: SugarRoll.be  Fact Sheet for Healthcare Providers: https://www.woods-mathews.com/  This test is not yet approved or cleared by  the [employer] and  has been authorized for detection and/or diagnosis of SARS-CoV-2 by FDA under an  Emergency Use Authorization (EUA). This EUA will remain  in effect (meaning this test can be used) for the duration of the COVID-19 declaration under Se ction 564(b)(1) of the Act, 21 U.S.C. section 360bbb-3(b)(1), unless the authorization is terminated or revoked sooner.  Performed at [facility], [place] [address] [place], [place] [address]       Pertinent Imaging NM Scan 08/25/20 IMPRESSION: Focal activity at the inferior aspect of the sacrum/coccyx which extends to the skin surface. Findings consistent residual mild infection/inflammation.  CT abdomen/pelvis 08/21/20   IMPRESSION: Large sacral decubitus ulceration with cortical irregularity at the inferior coccyx which could be due to chronic osteomyelitis as on prior exam. No loculated fluid collections or evidence of acute osteomyelitis  Large amount of colonic stool with a large fecal ball within the rectum.  Aortic Atherosclerosis (ICD10-I70.0).  All pertinent labs/Imagings/notes reviewed. All pertinent plain films and CT images have been personally visualized and interpreted; radiology reports have been reviewed. Decision making incorporated into the Impression / Recommendations.  I have spent 60 minutes for this patient encounter including  review of prior medical records with greater than 50% of time in face to face counsel of the patient/discussing diagnostics and plan of care.   Electronically signed by:  Rosiland Oz, MD Infectious Disease Physician [facility] [address] [place], [place] [address] Phone: [phone]  Fax: [phone]

## 2021-03-24 DEATH — deceased

## 2022-12-21 IMAGING — CT CT ABD-PELV W/ CM
2 of 5 series · 16 of 46 positions shown, 18 images · IV contrast (Omni 300)
Comparison: June 16, 2020

CLINICAL DATA: Sacral wound

EXAM:
CT ABDOMEN AND PELVIS WITH CONTRAST
TECHNIQUE: Multidetector CT imaging of the abdomen and pelvis was performed
using the standard protocol following bolus administration of
intravenous contrast.
CONTRAST:  100mL OMNIPAQUE IOHEXOL 300 MG/ML  SOLN

[Series 3: a/p w/ 5mm · axial · 0.86mm/px · z∈[-747,-332]mm · 13 of 93 slices shown, 15 images]
[im 5/93  soft-tissue]
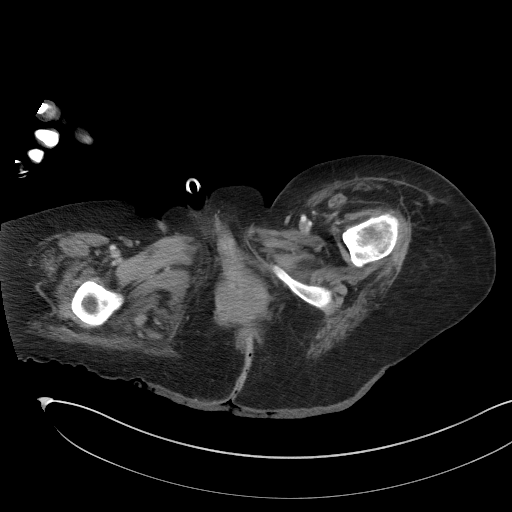
[im 5/93  bone]
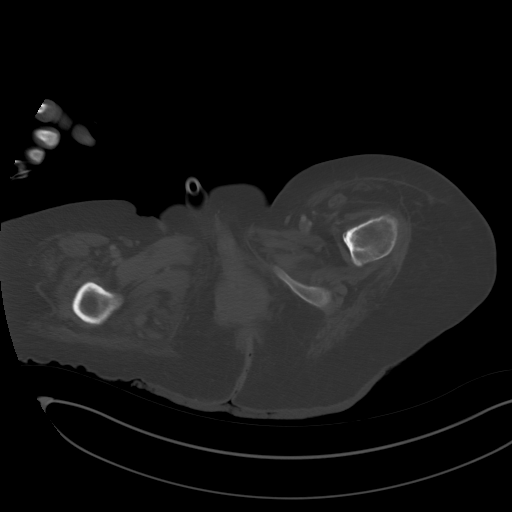
[im 15/93  soft-tissue]
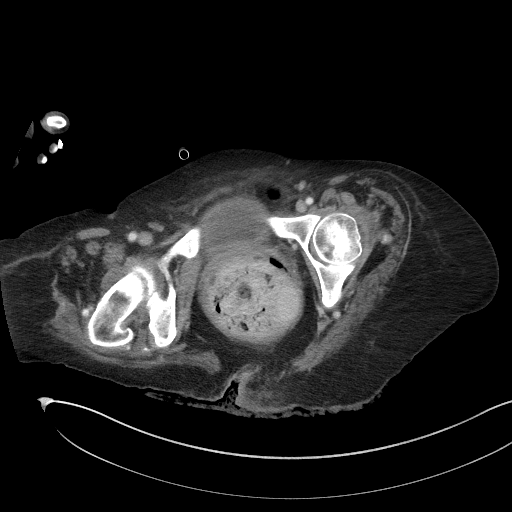
[im 20/93  soft-tissue]
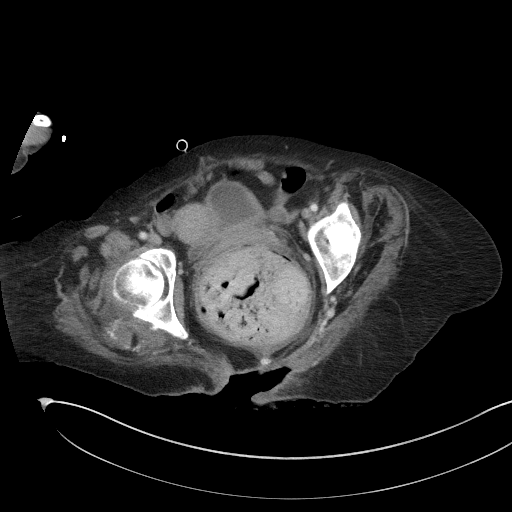
[im 25/93  soft-tissue]
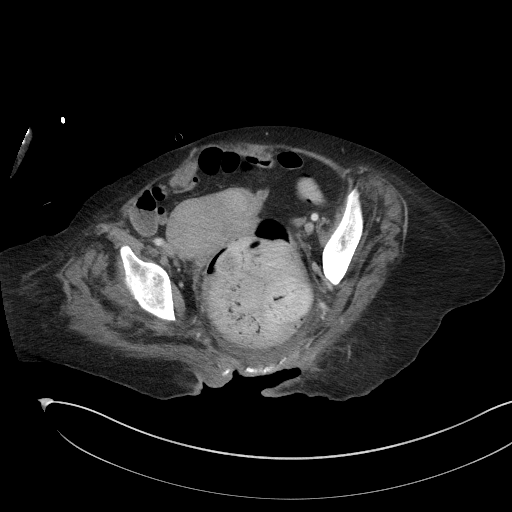
[im 34/93  soft-tissue]
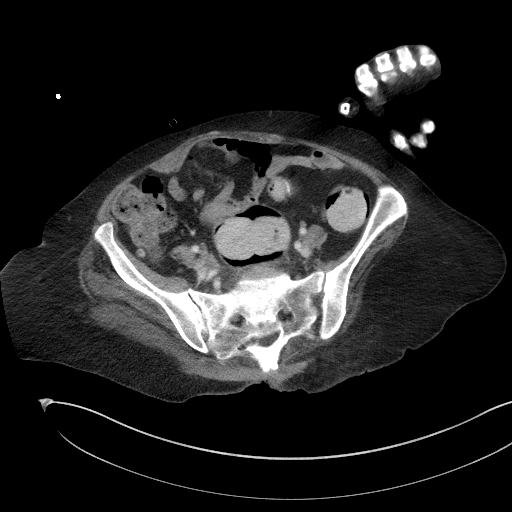
[im 39/93  soft-tissue]
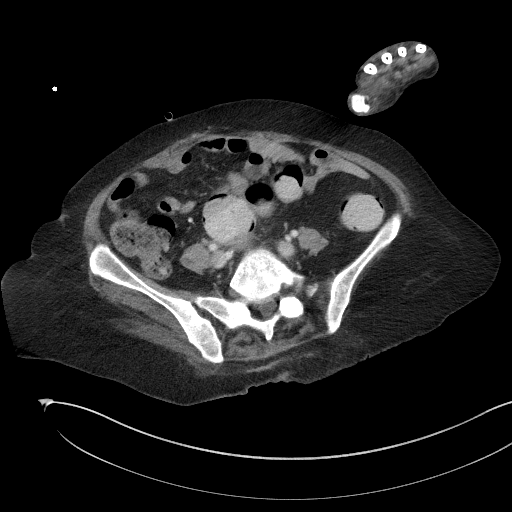
[im 49/93  soft-tissue]
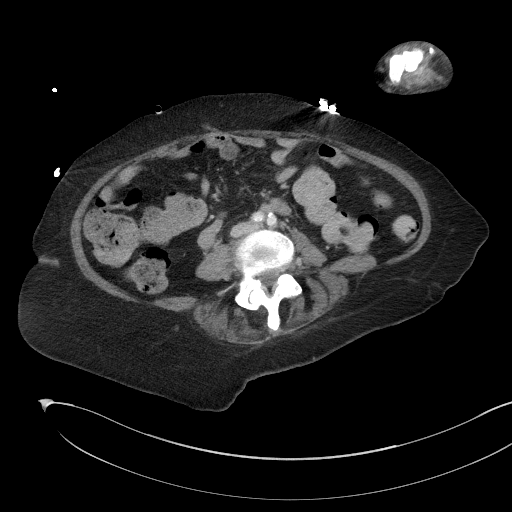
[im 54/93  soft-tissue]
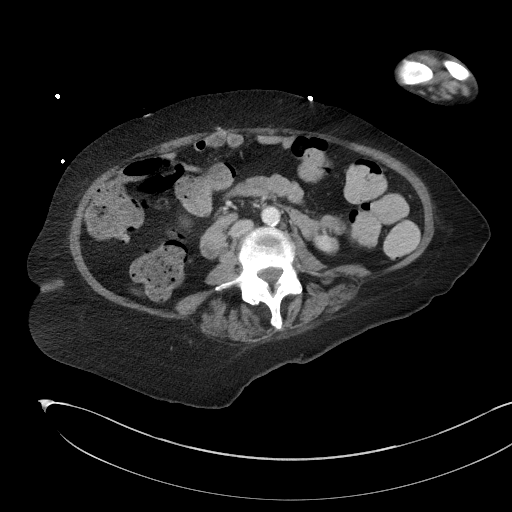
[im 59/93  soft-tissue]
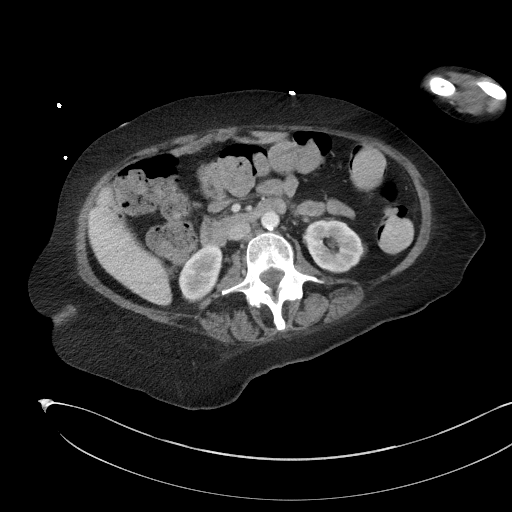
[im 59/93  bone]
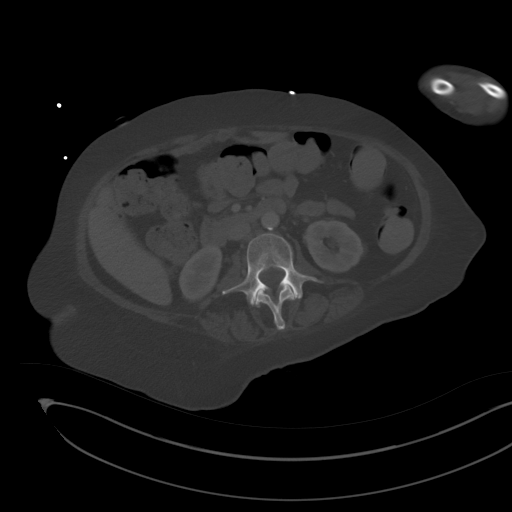
[im 68/93  soft-tissue]
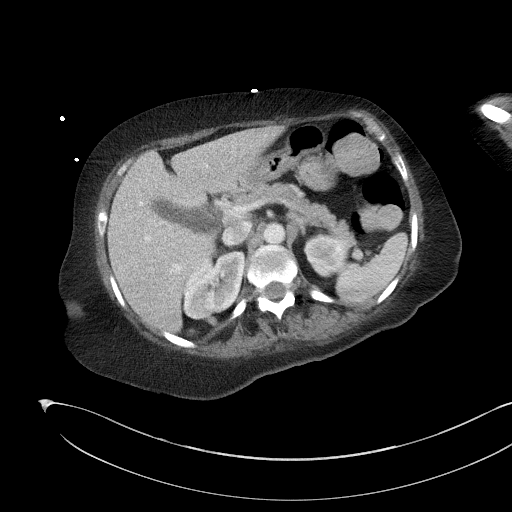
[im 73/93  soft-tissue]
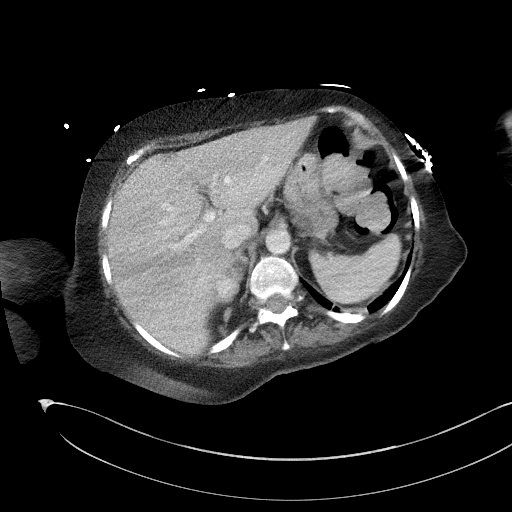
[im 78/93  soft-tissue]
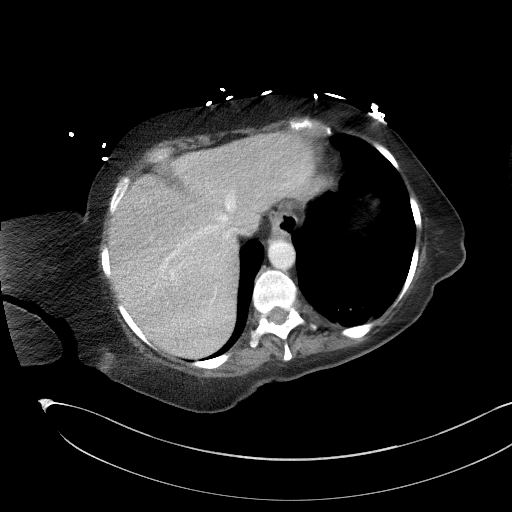
[im 88/93  soft-tissue]
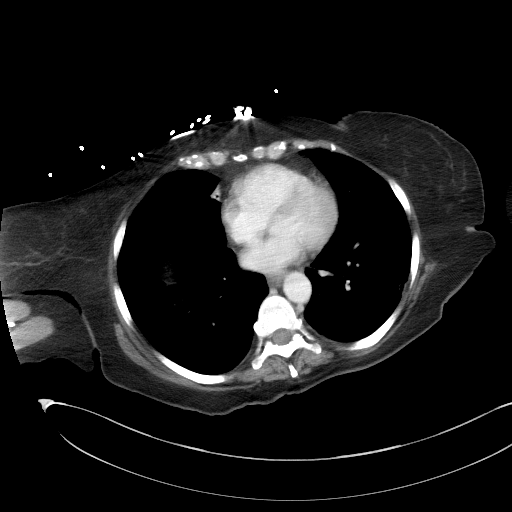

[Series 6: a/p w/ cor · coronal · 0.88mm/px · 3 of 146 slices shown]
[im 49/146  soft-tissue]
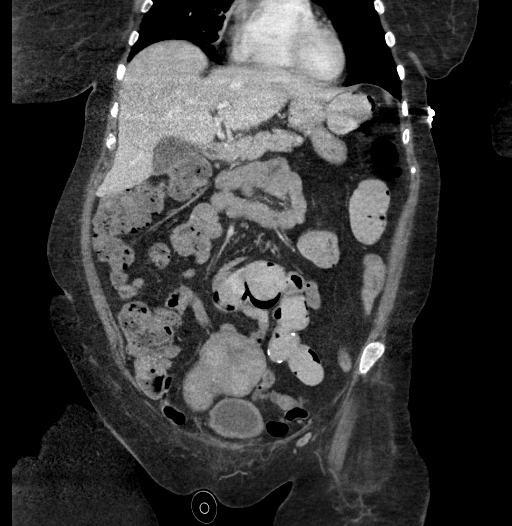
[im 65/146  soft-tissue]
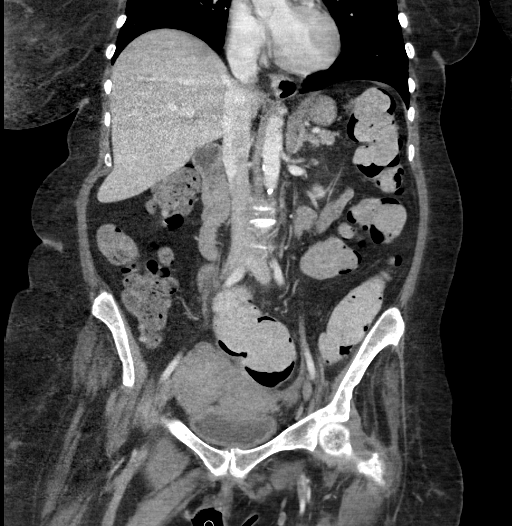
[im 81/146  soft-tissue]
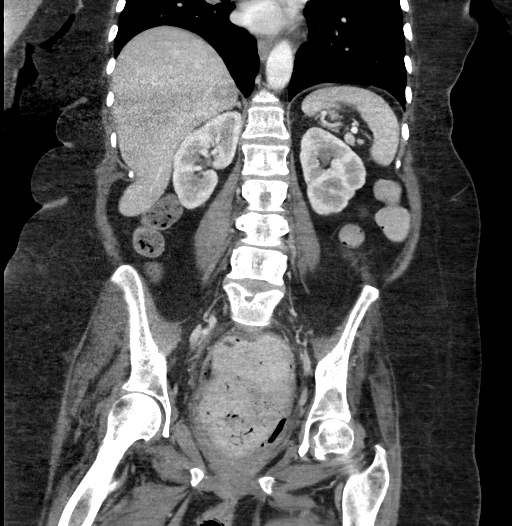

[16 of 46 positions shown; findings below may reference images not displayed]

FINDINGS: Lower chest: The visualized heart size within normal limits. No
pericardial fluid/thickening.

No hiatal hernia.

The visualized portions of the lungs are clear.

Hepatobiliary: The liver is normal in density without focal
abnormality.The main portal vein is patent. No evidence of calcified
gallstones, gallbladder wall thickening or biliary dilatation.

Pancreas: Unremarkable. No pancreatic ductal dilatation or
surrounding inflammatory changes.

Spleen: Normal in size without focal abnormality.

Adrenals/Urinary Tract: Both adrenal glands appear normal. The
kidneys and collecting system appear normal without evidence of
urinary tract calculus or hydronephrosis. Bladder is unremarkable.

Stomach/Bowel: The stomach and small bowel are unremarkable. There
is a large amount of colonic stool seen throughout. A dilated rectum
is seen with a fecal ball.

Vascular/Lymphatic: There are no enlarged mesenteric,
retroperitoneal, or pelvic lymph nodes. Scattered aortic
atherosclerotic calcifications are seen without aneurysmal
dilatation.

Reproductive: Again noted is a heterogeneous fibroid uterus some
with calcifications.

Other: No evidence of abdominal wall mass or hernia.

Musculoskeletal: There is a large sacral decubitus ulceration to the
level of the coccyx with slight periosteal reaction in cortical
irregularity at the inferior coccyx as the prior exam which could be
due to chronic osteomyelitis. No loculated fluid collection is seen.
IMPRESSION: Large sacral decubitus ulceration with cortical irregularity at the
inferior coccyx which could be due to chronic osteomyelitis as on
prior exam. No loculated fluid collections or evidence of acute
osteomyelitis

Large amount of colonic stool with a large fecal ball within the
rectum.

Aortic Atherosclerosis ([id number]).
